# Patient Record
Sex: Male | Born: 1945
Health system: Southern US, Community
[De-identification: ages and names within clinical notes are randomized; demographics above are authoritative.]

## PROBLEM LIST (undated history)

## (undated) DIAGNOSIS — R972 Elevated prostate specific antigen [PSA]: Secondary | ICD-10-CM

## (undated) DIAGNOSIS — M1990 Unspecified osteoarthritis, unspecified site: Secondary | ICD-10-CM

## (undated) DIAGNOSIS — F410 Panic disorder [episodic paroxysmal anxiety] without agoraphobia: Secondary | ICD-10-CM

## (undated) DIAGNOSIS — N403 Nodular prostate with lower urinary tract symptoms: Secondary | ICD-10-CM

## (undated) DIAGNOSIS — Z96652 Presence of left artificial knee joint: Secondary | ICD-10-CM

## (undated) DIAGNOSIS — G609 Hereditary and idiopathic neuropathy, unspecified: Secondary | ICD-10-CM

## (undated) DIAGNOSIS — N4340 Spermatocele of epididymis, unspecified: Secondary | ICD-10-CM

## (undated) DIAGNOSIS — R361 Hematospermia: Secondary | ICD-10-CM

## (undated) DIAGNOSIS — C189 Malignant neoplasm of colon, unspecified: Secondary | ICD-10-CM

## (undated) DIAGNOSIS — F419 Anxiety disorder, unspecified: Secondary | ICD-10-CM

## (undated) DIAGNOSIS — F32A Depression, unspecified: Secondary | ICD-10-CM

## (undated) DIAGNOSIS — I10 Essential (primary) hypertension: Secondary | ICD-10-CM

## (undated) DIAGNOSIS — R3912 Poor urinary stream: Secondary | ICD-10-CM

## (undated) DIAGNOSIS — J45909 Unspecified asthma, uncomplicated: Secondary | ICD-10-CM

## (undated) DIAGNOSIS — R339 Retention of urine, unspecified: Secondary | ICD-10-CM

## (undated) DIAGNOSIS — M1711 Unilateral primary osteoarthritis, right knee: Secondary | ICD-10-CM

## (undated) DIAGNOSIS — F329 Major depressive disorder, single episode, unspecified: Secondary | ICD-10-CM

## (undated) DIAGNOSIS — F411 Generalized anxiety disorder: Secondary | ICD-10-CM

## (undated) DIAGNOSIS — N419 Inflammatory disease of prostate, unspecified: Secondary | ICD-10-CM

## (undated) DIAGNOSIS — C61 Malignant neoplasm of prostate: Secondary | ICD-10-CM

## (undated) DIAGNOSIS — Z9221 Personal history of antineoplastic chemotherapy: Secondary | ICD-10-CM

## (undated) HISTORY — DX: Essential (primary) hypertension: I10

## (undated) HISTORY — DX: Elevated prostate specific antigen (PSA): R97.20

## (undated) HISTORY — DX: Hereditary and idiopathic neuropathy, unspecified: G60.9

## (undated) HISTORY — DX: Poor urinary stream: R39.12

## (undated) HISTORY — DX: Retention of urine, unspecified: R33.9

## (undated) HISTORY — DX: Hematospermia: R36.1

## (undated) HISTORY — DX: Malignant neoplasm of colon, unspecified: C18.9

## (undated) HISTORY — DX: Spermatocele of epididymis, unspecified: N43.40

## (undated) HISTORY — DX: Personal history of antineoplastic chemotherapy: Z92.21

## (undated) HISTORY — DX: Anxiety disorder, unspecified: F41.9

## (undated) HISTORY — PX: PROSTATE BIOPSY: SHX241

## (undated) HISTORY — DX: Nodular prostate with lower urinary tract symptoms: N40.3

## (undated) HISTORY — DX: Major depressive disorder, single episode, unspecified: F32.9

## (undated) HISTORY — PX: TONSILLECTOMY: SUR1361

## (undated) HISTORY — DX: Depression, unspecified: F32.A

## (undated) HISTORY — DX: Malignant neoplasm of prostate: C61

## (undated) HISTORY — DX: Inflammatory disease of prostate, unspecified: N41.9

## (undated) HISTORY — PX: COLONOSCOPY: SHX174

---

## 1998-03-29 ENCOUNTER — Emergency Department (HOSPITAL_COMMUNITY): Admission: EM | Admit: 1998-03-29 | Discharge: 1998-03-29 | Payer: Self-pay | Admitting: Emergency Medicine

## 2007-04-13 DIAGNOSIS — C189 Malignant neoplasm of colon, unspecified: Secondary | ICD-10-CM

## 2007-04-13 HISTORY — DX: Malignant neoplasm of colon, unspecified: C18.9

## 2008-01-25 ENCOUNTER — Ambulatory Visit (HOSPITAL_COMMUNITY): Admission: RE | Admit: 2008-01-25 | Discharge: 2008-01-25 | Payer: Self-pay | Admitting: Gastroenterology

## 2008-01-25 ENCOUNTER — Encounter (INDEPENDENT_AMBULATORY_CARE_PROVIDER_SITE_OTHER): Payer: Self-pay | Admitting: Gastroenterology

## 2008-01-26 ENCOUNTER — Observation Stay (HOSPITAL_COMMUNITY): Admission: AD | Admit: 2008-01-26 | Discharge: 2008-01-26 | Payer: Self-pay | Admitting: Gastroenterology

## 2008-01-31 ENCOUNTER — Encounter: Admission: RE | Admit: 2008-01-31 | Discharge: 2008-01-31 | Payer: Self-pay | Admitting: General Surgery

## 2008-02-11 HISTORY — PX: COLON RESECTION: SHX5231

## 2008-03-01 ENCOUNTER — Inpatient Hospital Stay (HOSPITAL_COMMUNITY): Admission: RE | Admit: 2008-03-01 | Discharge: 2008-03-04 | Payer: Self-pay | Admitting: General Surgery

## 2008-03-01 ENCOUNTER — Encounter (INDEPENDENT_AMBULATORY_CARE_PROVIDER_SITE_OTHER): Payer: Self-pay | Admitting: General Surgery

## 2008-03-08 ENCOUNTER — Inpatient Hospital Stay (HOSPITAL_COMMUNITY): Admission: EM | Admit: 2008-03-08 | Discharge: 2008-03-11 | Payer: Self-pay | Admitting: Emergency Medicine

## 2008-03-19 ENCOUNTER — Ambulatory Visit: Payer: Self-pay | Admitting: Hematology and Oncology

## 2008-03-22 LAB — COMPREHENSIVE METABOLIC PANEL
AST: 17 U/L (ref 0–37)
Albumin: 4.1 g/dL (ref 3.5–5.2)
Alkaline Phosphatase: 71 U/L (ref 39–117)
BUN: 7 mg/dL (ref 6–23)
Potassium: 3.9 mEq/L (ref 3.5–5.3)
Sodium: 141 mEq/L (ref 135–145)
Total Bilirubin: 0.4 mg/dL (ref 0.3–1.2)

## 2008-03-22 LAB — CBC WITH DIFFERENTIAL/PLATELET
Basophils Absolute: 0 10*3/uL (ref 0.0–0.1)
EOS%: 1.7 % (ref 0.0–7.0)
MCH: 24.6 pg — ABNORMAL LOW (ref 28.0–33.4)
MCV: 78.9 fL — ABNORMAL LOW (ref 81.6–98.0)
MONO%: 4.8 % (ref 0.0–13.0)
RBC: 4.54 10*6/uL (ref 4.20–5.71)
RDW: 21.5 % — ABNORMAL HIGH (ref 11.2–14.6)

## 2008-04-01 ENCOUNTER — Ambulatory Visit (HOSPITAL_BASED_OUTPATIENT_CLINIC_OR_DEPARTMENT_OTHER): Admission: RE | Admit: 2008-04-01 | Discharge: 2008-04-01 | Payer: Self-pay | Admitting: General Surgery

## 2008-04-02 ENCOUNTER — Ambulatory Visit (HOSPITAL_COMMUNITY): Admission: RE | Admit: 2008-04-02 | Discharge: 2008-04-02 | Payer: Self-pay | Admitting: Hematology and Oncology

## 2008-04-12 DIAGNOSIS — Z9221 Personal history of antineoplastic chemotherapy: Secondary | ICD-10-CM

## 2008-04-12 DIAGNOSIS — C61 Malignant neoplasm of prostate: Secondary | ICD-10-CM

## 2008-04-12 HISTORY — DX: Malignant neoplasm of prostate: C61

## 2008-04-12 HISTORY — DX: Personal history of antineoplastic chemotherapy: Z92.21

## 2008-04-16 LAB — COMPREHENSIVE METABOLIC PANEL
AST: 21 U/L (ref 0–37)
BUN: 10 mg/dL (ref 6–23)
Calcium: 9 mg/dL (ref 8.4–10.5)
Chloride: 107 mEq/L (ref 96–112)
Creatinine, Ser: 1.09 mg/dL (ref 0.40–1.50)
Total Bilirubin: 0.7 mg/dL (ref 0.3–1.2)

## 2008-04-16 LAB — CBC WITH DIFFERENTIAL/PLATELET
Basophils Absolute: 0 10*3/uL (ref 0.0–0.1)
EOS%: 2.2 % (ref 0.0–7.0)
HCT: 37.5 % — ABNORMAL LOW (ref 38.7–49.9)
HGB: 11.9 g/dL — ABNORMAL LOW (ref 13.0–17.1)
LYMPH%: 21.5 % (ref 14.0–48.0)
MCH: 24.6 pg — ABNORMAL LOW (ref 28.0–33.4)
MCV: 77.4 fL — ABNORMAL LOW (ref 81.6–98.0)
MONO%: 5.4 % (ref 0.0–13.0)
NEUT%: 70.5 % (ref 40.0–75.0)
Platelets: 335 10*3/uL (ref 145–400)
lymph#: 1.7 10*3/uL (ref 0.9–3.3)

## 2008-04-26 LAB — CBC WITH DIFFERENTIAL/PLATELET
EOS%: 2.9 % (ref 0.0–7.0)
MCH: 25 pg — ABNORMAL LOW (ref 28.0–33.4)
MCV: 77.7 fL — ABNORMAL LOW (ref 81.6–98.0)
MONO%: 6.5 % (ref 0.0–13.0)
NEUT#: 8.7 10*3/uL — ABNORMAL HIGH (ref 1.5–6.5)
RBC: 4.55 10*6/uL (ref 4.20–5.71)
RDW: 16.9 % — ABNORMAL HIGH (ref 11.2–14.6)

## 2008-04-26 LAB — COMPREHENSIVE METABOLIC PANEL
AST: 13 U/L (ref 0–37)
Albumin: 3.9 g/dL (ref 3.5–5.2)
Alkaline Phosphatase: 116 U/L (ref 39–117)
Potassium: 3.2 mEq/L — ABNORMAL LOW (ref 3.5–5.3)
Sodium: 140 mEq/L (ref 135–145)
Total Protein: 6.6 g/dL (ref 6.0–8.3)

## 2008-04-29 ENCOUNTER — Ambulatory Visit: Payer: Self-pay | Admitting: Hematology and Oncology

## 2008-05-14 LAB — COMPREHENSIVE METABOLIC PANEL
ALT: 15 U/L (ref 0–53)
AST: 15 U/L (ref 0–37)
Albumin: 4.1 g/dL (ref 3.5–5.2)
BUN: 8 mg/dL (ref 6–23)
CO2: 26 mEq/L (ref 19–32)
Calcium: 9.4 mg/dL (ref 8.4–10.5)
Chloride: 106 mEq/L (ref 96–112)
Creatinine, Ser: 1.05 mg/dL (ref 0.40–1.50)
Potassium: 3.7 mEq/L (ref 3.5–5.3)

## 2008-05-14 LAB — CBC WITH DIFFERENTIAL/PLATELET
Basophils Absolute: 0.1 10*3/uL (ref 0.0–0.1)
EOS%: 1.8 % (ref 0.0–7.0)
HCT: 36.1 % — ABNORMAL LOW (ref 38.7–49.9)
HGB: 11.7 g/dL — ABNORMAL LOW (ref 13.0–17.1)
MCH: 25.1 pg — ABNORMAL LOW (ref 28.0–33.4)
MONO#: 1.3 10*3/uL — ABNORMAL HIGH (ref 0.1–0.9)
NEUT#: 10.6 10*3/uL — ABNORMAL HIGH (ref 1.5–6.5)
NEUT%: 71.7 % (ref 40.0–75.0)
RDW: 17.9 % — ABNORMAL HIGH (ref 11.2–14.6)
WBC: 14.7 10*3/uL — ABNORMAL HIGH (ref 4.0–10.0)
lymph#: 2.5 10*3/uL (ref 0.9–3.3)

## 2008-05-28 LAB — CBC WITH DIFFERENTIAL/PLATELET
BASO%: 0.5 % (ref 0.0–2.0)
Basophils Absolute: 0.1 10*3/uL (ref 0.0–0.1)
EOS%: 2.5 % (ref 0.0–7.0)
HGB: 12.4 g/dL — ABNORMAL LOW (ref 13.0–17.1)
MCH: 24.9 pg — ABNORMAL LOW (ref 28.0–33.4)
MCHC: 31.8 g/dL — ABNORMAL LOW (ref 32.0–35.9)
MCV: 78.1 fL — ABNORMAL LOW (ref 81.6–98.0)
MONO%: 10.5 % (ref 0.0–13.0)
RBC: 5 10*6/uL (ref 4.20–5.71)
RDW: 18.6 % — ABNORMAL HIGH (ref 11.2–14.6)
lymph#: 1.7 10*3/uL (ref 0.9–3.3)

## 2008-05-28 LAB — COMPREHENSIVE METABOLIC PANEL
ALT: 21 U/L (ref 0–53)
AST: 24 U/L (ref 0–37)
Albumin: 4.1 g/dL (ref 3.5–5.2)
Alkaline Phosphatase: 154 U/L — ABNORMAL HIGH (ref 39–117)
BUN: 12 mg/dL (ref 6–23)
Chloride: 100 mEq/L (ref 96–112)
Potassium: 3.3 mEq/L — ABNORMAL LOW (ref 3.5–5.3)

## 2008-06-07 LAB — CBC WITH DIFFERENTIAL/PLATELET
BASO%: 0.2 % (ref 0.0–2.0)
Basophils Absolute: 0 10*3/uL (ref 0.0–0.1)
EOS%: 1.7 % (ref 0.0–7.0)
HCT: 36.1 % — ABNORMAL LOW (ref 38.4–49.9)
HGB: 11.5 g/dL — ABNORMAL LOW (ref 13.0–17.1)
MCH: 24.6 pg — ABNORMAL LOW (ref 27.2–33.4)
MCHC: 31.7 g/dL — ABNORMAL LOW (ref 32.0–36.0)
MCV: 77.7 fL — ABNORMAL LOW (ref 79.3–98.0)
MONO%: 5 % (ref 0.0–14.0)
NEUT%: 81.6 % — ABNORMAL HIGH (ref 39.0–75.0)
RDW: 19.7 % — ABNORMAL HIGH (ref 11.0–14.6)

## 2008-06-07 LAB — COMPREHENSIVE METABOLIC PANEL
ALT: 38 U/L (ref 0–53)
AST: 29 U/L (ref 0–37)
Alkaline Phosphatase: 169 U/L — ABNORMAL HIGH (ref 39–117)
BUN: 14 mg/dL (ref 6–23)
Creatinine, Ser: 1.21 mg/dL (ref 0.40–1.50)
Total Bilirubin: 0.5 mg/dL (ref 0.3–1.2)

## 2008-06-12 ENCOUNTER — Ambulatory Visit: Payer: Self-pay | Admitting: Hematology and Oncology

## 2008-06-25 LAB — CBC WITH DIFFERENTIAL/PLATELET
Basophils Absolute: 0.1 10*3/uL (ref 0.0–0.1)
Eosinophils Absolute: 0.2 10*3/uL (ref 0.0–0.5)
HCT: 39.3 % (ref 38.4–49.9)
HGB: 12 g/dL — ABNORMAL LOW (ref 13.0–17.1)
LYMPH%: 18.5 % (ref 14.0–49.0)
MCV: 79.4 fL (ref 79.3–98.0)
MONO#: 1.4 10*3/uL — ABNORMAL HIGH (ref 0.1–0.9)
MONO%: 9.7 % (ref 0.0–14.0)
NEUT#: 10.1 10*3/uL — ABNORMAL HIGH (ref 1.5–6.5)
NEUT%: 70 % (ref 39.0–75.0)
Platelets: 221 10*3/uL (ref 140–400)
WBC: 14.4 10*3/uL — ABNORMAL HIGH (ref 4.0–10.3)

## 2008-06-25 LAB — COMPREHENSIVE METABOLIC PANEL
AST: 28 U/L (ref 0–37)
BUN: 7 mg/dL (ref 6–23)
Calcium: 9.5 mg/dL (ref 8.4–10.5)
Chloride: 105 mEq/L (ref 96–112)
Creatinine, Ser: 1.13 mg/dL (ref 0.40–1.50)
Glucose, Bld: 110 mg/dL — ABNORMAL HIGH (ref 70–99)

## 2008-07-09 LAB — COMPREHENSIVE METABOLIC PANEL
Albumin: 4.2 g/dL (ref 3.5–5.2)
BUN: 8 mg/dL (ref 6–23)
CO2: 22 mEq/L (ref 19–32)
Calcium: 9.4 mg/dL (ref 8.4–10.5)
Chloride: 107 mEq/L (ref 96–112)
Creatinine, Ser: 1.1 mg/dL (ref 0.40–1.50)
Glucose, Bld: 87 mg/dL (ref 70–99)
Potassium: 3.8 mEq/L (ref 3.5–5.3)

## 2008-07-09 LAB — CBC WITH DIFFERENTIAL/PLATELET
Basophils Absolute: 0.1 10*3/uL (ref 0.0–0.1)
EOS%: 1.6 % (ref 0.0–7.0)
Eosinophils Absolute: 0.3 10*3/uL (ref 0.0–0.5)
HCT: 37.2 % — ABNORMAL LOW (ref 38.4–49.9)
HGB: 11.8 g/dL — ABNORMAL LOW (ref 13.0–17.1)
MCH: 25.3 pg — ABNORMAL LOW (ref 27.2–33.4)
MONO#: 1.4 10*3/uL — ABNORMAL HIGH (ref 0.1–0.9)
NEUT#: 13.4 10*3/uL — ABNORMAL HIGH (ref 1.5–6.5)
NEUT%: 74.7 % (ref 39.0–75.0)
RDW: 24.5 % — ABNORMAL HIGH (ref 11.0–14.6)
lymph#: 2.8 10*3/uL (ref 0.9–3.3)

## 2008-07-19 LAB — CBC WITH DIFFERENTIAL/PLATELET
BASO%: 0 % (ref 0.0–2.0)
LYMPH%: 7.8 % — ABNORMAL LOW (ref 14.0–49.0)
MCH: 25.6 pg — ABNORMAL LOW (ref 27.2–33.4)
MCHC: 32 g/dL (ref 32.0–36.0)
MCV: 79.9 fL (ref 79.3–98.0)
MONO%: 6.1 % (ref 0.0–14.0)
Platelets: 273 10*3/uL (ref 140–400)
RBC: 4.31 10*6/uL (ref 4.20–5.82)

## 2008-07-19 LAB — COMPREHENSIVE METABOLIC PANEL
ALT: 13 U/L (ref 0–53)
Alkaline Phosphatase: 182 U/L — ABNORMAL HIGH (ref 39–117)
Sodium: 143 mEq/L (ref 135–145)
Total Bilirubin: 0.4 mg/dL (ref 0.3–1.2)
Total Protein: 6.5 g/dL (ref 6.0–8.3)

## 2008-08-02 ENCOUNTER — Ambulatory Visit: Payer: Self-pay | Admitting: Hematology and Oncology

## 2008-08-06 LAB — CBC WITH DIFFERENTIAL/PLATELET
Eosinophils Absolute: 0.1 10*3/uL (ref 0.0–0.5)
HCT: 35.7 % — ABNORMAL LOW (ref 38.4–49.9)
LYMPH%: 36.6 % (ref 14.0–49.0)
MCV: 81.8 fL (ref 79.3–98.0)
MONO%: 16.2 % — ABNORMAL HIGH (ref 0.0–14.0)
NEUT#: 1.6 10*3/uL (ref 1.5–6.5)
NEUT%: 42.6 % (ref 39.0–75.0)
Platelets: 206 10*3/uL (ref 140–400)
RBC: 4.36 10*6/uL (ref 4.20–5.82)

## 2008-08-06 LAB — COMPREHENSIVE METABOLIC PANEL
Alkaline Phosphatase: 91 U/L (ref 39–117)
BUN: 8 mg/dL (ref 6–23)
Creatinine, Ser: 0.97 mg/dL (ref 0.40–1.50)
Glucose, Bld: 110 mg/dL — ABNORMAL HIGH (ref 70–99)
Sodium: 140 mEq/L (ref 135–145)
Total Bilirubin: 0.4 mg/dL (ref 0.3–1.2)
Total Protein: 6.8 g/dL (ref 6.0–8.3)

## 2008-08-20 LAB — COMPREHENSIVE METABOLIC PANEL
ALT: 15 U/L (ref 0–53)
Alkaline Phosphatase: 134 U/L — ABNORMAL HIGH (ref 39–117)
Creatinine, Ser: 0.96 mg/dL (ref 0.40–1.50)
Sodium: 141 mEq/L (ref 135–145)
Total Bilirubin: 0.4 mg/dL (ref 0.3–1.2)
Total Protein: 6.8 g/dL (ref 6.0–8.3)

## 2008-08-20 LAB — CBC WITH DIFFERENTIAL/PLATELET
BASO%: 0.3 % (ref 0.0–2.0)
EOS%: 1.2 % (ref 0.0–7.0)
LYMPH%: 14.5 % (ref 14.0–49.0)
MCH: 26.9 pg — ABNORMAL LOW (ref 27.2–33.4)
MCHC: 32.1 g/dL (ref 32.0–36.0)
MCV: 83.8 fL (ref 79.3–98.0)
MONO%: 5.3 % (ref 0.0–14.0)
Platelets: 149 10*3/uL (ref 140–400)
RBC: 4.43 10*6/uL (ref 4.20–5.82)
WBC: 15.3 10*3/uL — ABNORMAL HIGH (ref 4.0–10.3)

## 2008-09-03 LAB — CBC WITH DIFFERENTIAL/PLATELET
BASO%: 0.8 % (ref 0.0–2.0)
Eosinophils Absolute: 0.1 10*3/uL (ref 0.0–0.5)
HCT: 35.2 % — ABNORMAL LOW (ref 38.4–49.9)
LYMPH%: 47.2 % (ref 14.0–49.0)
MCHC: 32.5 g/dL (ref 32.0–36.0)
MCV: 83.3 fL (ref 79.3–98.0)
MONO%: 15.4 % — ABNORMAL HIGH (ref 0.0–14.0)
NEUT%: 33.2 % — ABNORMAL LOW (ref 39.0–75.0)
Platelets: 192 10*3/uL (ref 140–400)
RBC: 4.23 10*6/uL (ref 4.20–5.82)

## 2008-09-03 LAB — COMPREHENSIVE METABOLIC PANEL
ALT: 17 U/L (ref 0–53)
Alkaline Phosphatase: 82 U/L (ref 39–117)
CO2: 27 mEq/L (ref 19–32)
Sodium: 142 mEq/L (ref 135–145)
Total Bilirubin: 0.4 mg/dL (ref 0.3–1.2)
Total Protein: 6.8 g/dL (ref 6.0–8.3)

## 2008-09-13 ENCOUNTER — Ambulatory Visit: Payer: Self-pay | Admitting: Hematology and Oncology

## 2008-09-17 LAB — COMPREHENSIVE METABOLIC PANEL
Albumin: 4.1 g/dL (ref 3.5–5.2)
CO2: 25 mEq/L (ref 19–32)
Chloride: 104 mEq/L (ref 96–112)
Glucose, Bld: 85 mg/dL (ref 70–99)
Potassium: 3.4 mEq/L — ABNORMAL LOW (ref 3.5–5.3)
Sodium: 141 mEq/L (ref 135–145)
Total Protein: 7.4 g/dL (ref 6.0–8.3)

## 2008-09-17 LAB — CBC WITH DIFFERENTIAL/PLATELET
Eosinophils Absolute: 0.2 10*3/uL (ref 0.0–0.5)
MONO#: 1 10*3/uL — ABNORMAL HIGH (ref 0.1–0.9)
NEUT#: 15.6 10*3/uL — ABNORMAL HIGH (ref 1.5–6.5)
RBC: 4.72 10*6/uL (ref 4.20–5.82)
RDW: 20.1 % — ABNORMAL HIGH (ref 11.0–14.6)
WBC: 20 10*3/uL — ABNORMAL HIGH (ref 4.0–10.3)

## 2008-10-02 ENCOUNTER — Ambulatory Visit (HOSPITAL_COMMUNITY): Admission: RE | Admit: 2008-10-02 | Discharge: 2008-10-02 | Payer: Self-pay | Admitting: Hematology and Oncology

## 2008-10-02 LAB — CBC WITH DIFFERENTIAL/PLATELET
Eosinophils Absolute: 0.2 10*3/uL (ref 0.0–0.5)
MCV: 82.5 fL (ref 79.3–98.0)
MONO%: 10.7 % (ref 0.0–14.0)
NEUT#: 3.1 10*3/uL (ref 1.5–6.5)
RBC: 4.53 10*6/uL (ref 4.20–5.82)
RDW: 20 % — ABNORMAL HIGH (ref 11.0–14.6)
WBC: 5.7 10*3/uL (ref 4.0–10.3)

## 2008-10-02 LAB — COMPREHENSIVE METABOLIC PANEL
ALT: 19 U/L (ref 0–53)
AST: 35 U/L (ref 0–37)
Albumin: 3.6 g/dL (ref 3.5–5.2)
Alkaline Phosphatase: 76 U/L (ref 39–117)
Glucose, Bld: 100 mg/dL — ABNORMAL HIGH (ref 70–99)
Potassium: 2.8 mEq/L — ABNORMAL LOW (ref 3.5–5.3)
Sodium: 134 mEq/L — ABNORMAL LOW (ref 135–145)
Total Protein: 6.9 g/dL (ref 6.0–8.3)

## 2008-10-30 ENCOUNTER — Ambulatory Visit: Payer: Self-pay | Admitting: Hematology and Oncology

## 2008-12-11 ENCOUNTER — Ambulatory Visit: Payer: Self-pay | Admitting: Hematology and Oncology

## 2009-01-15 ENCOUNTER — Ambulatory Visit: Payer: Self-pay | Admitting: Oncology

## 2009-01-17 LAB — COMPREHENSIVE METABOLIC PANEL
ALT: 20 U/L (ref 0–53)
AST: 30 U/L (ref 0–37)
Albumin: 3.9 g/dL (ref 3.5–5.2)
BUN: 13 mg/dL (ref 6–23)
CO2: 27 mEq/L (ref 19–32)
Calcium: 9.4 mg/dL (ref 8.4–10.5)
Chloride: 101 mEq/L (ref 96–112)
Potassium: 2.8 mEq/L — ABNORMAL LOW (ref 3.5–5.3)

## 2009-01-17 LAB — CBC WITH DIFFERENTIAL/PLATELET
Basophils Absolute: 0 10*3/uL (ref 0.0–0.1)
EOS%: 3.2 % (ref 0.0–7.0)
HCT: 37.1 % — ABNORMAL LOW (ref 38.4–49.9)
HGB: 12.3 g/dL — ABNORMAL LOW (ref 13.0–17.1)
MCH: 27.8 pg (ref 27.2–33.4)
MONO#: 0.4 10*3/uL (ref 0.1–0.9)
NEUT#: 4.9 10*3/uL (ref 1.5–6.5)
RDW: 13.7 % (ref 11.0–14.6)
WBC: 8 10*3/uL (ref 4.0–10.3)
lymph#: 2.5 10*3/uL (ref 0.9–3.3)

## 2009-01-17 LAB — CEA: CEA: 1.8 ng/mL (ref 0.0–5.0)

## 2009-01-20 ENCOUNTER — Encounter: Admission: RE | Admit: 2009-01-20 | Discharge: 2009-01-20 | Payer: Self-pay | Admitting: Hematology and Oncology

## 2009-01-22 LAB — BASIC METABOLIC PANEL
BUN: 10 mg/dL (ref 6–23)
Creatinine, Ser: 1.46 mg/dL (ref 0.40–1.50)
Potassium: 2.7 mEq/L — CL (ref 3.5–5.3)

## 2009-02-16 ENCOUNTER — Emergency Department (HOSPITAL_COMMUNITY): Admission: EM | Admit: 2009-02-16 | Discharge: 2009-02-17 | Payer: Self-pay | Admitting: Emergency Medicine

## 2009-02-16 ENCOUNTER — Emergency Department (HOSPITAL_COMMUNITY): Admission: EM | Admit: 2009-02-16 | Discharge: 2009-02-16 | Payer: Self-pay | Admitting: Emergency Medicine

## 2009-02-17 ENCOUNTER — Ambulatory Visit: Payer: Self-pay | Admitting: Psychiatry

## 2009-02-17 ENCOUNTER — Inpatient Hospital Stay (HOSPITAL_COMMUNITY): Admission: RE | Admit: 2009-02-17 | Discharge: 2009-02-19 | Payer: Self-pay | Admitting: Psychiatry

## 2009-03-17 ENCOUNTER — Ambulatory Visit: Payer: Self-pay | Admitting: Oncology

## 2009-03-26 ENCOUNTER — Ambulatory Visit (HOSPITAL_COMMUNITY): Admission: RE | Admit: 2009-03-26 | Discharge: 2009-03-26 | Payer: Self-pay | Admitting: Psychiatry

## 2009-05-12 ENCOUNTER — Ambulatory Visit: Payer: Self-pay | Admitting: Oncology

## 2009-05-14 LAB — CEA: CEA: 1.9 ng/mL (ref 0.0–5.0)

## 2009-05-14 LAB — CBC WITH DIFFERENTIAL/PLATELET
Basophils Absolute: 0 10*3/uL (ref 0.0–0.1)
Eosinophils Absolute: 0.2 10*3/uL (ref 0.0–0.5)
HCT: 41.1 % (ref 38.4–49.9)
HGB: 13.4 g/dL (ref 13.0–17.1)
LYMPH%: 28.9 % (ref 14.0–49.0)
MCV: 88.4 fL (ref 79.3–98.0)
MONO#: 0.4 10*3/uL (ref 0.1–0.9)
MONO%: 6.3 % (ref 0.0–14.0)
NEUT#: 4.2 10*3/uL (ref 1.5–6.5)
NEUT%: 61.7 % (ref 39.0–75.0)
Platelets: 292 10*3/uL (ref 140–400)
RBC: 4.65 10*6/uL (ref 4.20–5.82)
WBC: 6.9 10*3/uL (ref 4.0–10.3)

## 2009-05-14 LAB — COMPREHENSIVE METABOLIC PANEL
AST: 22 U/L (ref 0–37)
Albumin: 4.6 g/dL (ref 3.5–5.2)
Chloride: 107 mEq/L (ref 96–112)
Creatinine, Ser: 1.24 mg/dL (ref 0.40–1.50)
Potassium: 4.3 mEq/L (ref 3.5–5.3)
Sodium: 140 mEq/L (ref 135–145)
Total Protein: 7.9 g/dL (ref 6.0–8.3)

## 2009-05-16 ENCOUNTER — Encounter: Admission: RE | Admit: 2009-05-16 | Discharge: 2009-05-16 | Payer: Self-pay | Admitting: Oncology

## 2009-07-11 ENCOUNTER — Ambulatory Visit: Payer: Self-pay | Admitting: Oncology

## 2009-09-09 ENCOUNTER — Ambulatory Visit: Payer: Self-pay | Admitting: Oncology

## 2009-09-10 LAB — COMPREHENSIVE METABOLIC PANEL
AST: 31 U/L (ref 0–37)
Alkaline Phosphatase: 76 U/L (ref 39–117)
CO2: 21 mEq/L (ref 19–32)
Glucose, Bld: 83 mg/dL (ref 70–99)
Sodium: 139 mEq/L (ref 135–145)
Total Bilirubin: 0.4 mg/dL (ref 0.3–1.2)

## 2009-09-10 LAB — CBC WITH DIFFERENTIAL/PLATELET
BASO%: 0.7 % (ref 0.0–2.0)
HGB: 12.2 g/dL — ABNORMAL LOW (ref 13.0–17.1)
MCHC: 33.9 g/dL (ref 32.0–36.0)
MCV: 87.8 fL (ref 79.3–98.0)
Platelets: 298 10*3/uL (ref 140–400)
RBC: 4.09 10*6/uL — ABNORMAL LOW (ref 4.20–5.82)
RDW: 15.7 % — ABNORMAL HIGH (ref 11.0–14.6)
WBC: 6.6 10*3/uL (ref 4.0–10.3)
lymph#: 1.7 10*3/uL (ref 0.9–3.3)

## 2009-09-15 ENCOUNTER — Encounter: Admission: RE | Admit: 2009-09-15 | Discharge: 2009-09-15 | Payer: Self-pay | Admitting: Hematology and Oncology

## 2009-09-22 ENCOUNTER — Ambulatory Visit (HOSPITAL_COMMUNITY): Admission: RE | Admit: 2009-09-22 | Discharge: 2009-09-22 | Payer: Self-pay | Admitting: General Surgery

## 2010-01-09 ENCOUNTER — Ambulatory Visit: Payer: Self-pay | Admitting: Oncology

## 2010-05-03 ENCOUNTER — Encounter: Payer: Self-pay | Admitting: Hematology and Oncology

## 2010-05-03 ENCOUNTER — Encounter (HOSPITAL_COMMUNITY): Payer: Self-pay | Admitting: Oncology

## 2010-06-29 LAB — BASIC METABOLIC PANEL
BUN: 6 mg/dL (ref 6–23)
Chloride: 107 mEq/L (ref 96–112)
GFR calc Af Amer: >60 mL/min (ref >60)
GFR calc non Af Amer: >60 mL/min (ref >60)
Potassium: 4.8 mEq/L (ref 3.5–5.1)

## 2010-06-29 LAB — CBC
HCT: 39.7 % (ref 39.0–52.0)
MCV: 89 fL (ref 78.0–100.0)
Platelets: 320 10*3/uL (ref 150–400)
RBC: 4.46 MIL/uL (ref 4.22–5.81)
WBC: 6.8 10*3/uL (ref 4.0–10.5)

## 2010-07-15 LAB — RAPID URINE DRUG SCREEN, HOSP PERFORMED
Amphetamines: NOT DETECTED
Barbiturates: NOT DETECTED
Benzodiazepines: POSITIVE — AB
Cocaine: NOT DETECTED

## 2010-07-15 LAB — URINALYSIS, ROUTINE W REFLEX MICROSCOPIC
Bilirubin Urine: NEGATIVE
Glucose, UA: NEGATIVE mg/dL
Nitrite: NEGATIVE
Specific Gravity, Urine: 1.021 (ref 1.005–1.030)
pH: 6 (ref 5.0–8.0)

## 2010-07-15 LAB — COMPREHENSIVE METABOLIC PANEL
ALT: 18 U/L (ref 0–53)
ALT: 21 U/L (ref 0–53)
AST: 33 U/L (ref 0–37)
Albumin: 3.6 g/dL (ref 3.5–5.2)
Albumin: 3.9 g/dL (ref 3.5–5.2)
Alkaline Phosphatase: 74 U/L (ref 39–117)
Alkaline Phosphatase: 78 U/L (ref 39–117)
BUN: 8 mg/dL (ref 6–23)
Calcium: 9 mg/dL (ref 8.4–10.5)
Chloride: 107 mEq/L (ref 96–112)
GFR calc Af Amer: 56 mL/min — ABNORMAL LOW (ref >60)
Glucose, Bld: 113 mg/dL — ABNORMAL HIGH (ref 70–99)
Glucose, Bld: 121 mg/dL — ABNORMAL HIGH (ref 70–99)
Potassium: 2.5 mEq/L — CL (ref 3.5–5.1)
Potassium: 3.1 mEq/L — ABNORMAL LOW (ref 3.5–5.1)
Sodium: 136 mEq/L (ref 135–145)
Sodium: 142 mEq/L (ref 135–145)
Total Bilirubin: 0.6 mg/dL (ref 0.3–1.2)
Total Protein: 6.9 g/dL (ref 6.0–8.3)
Total Protein: 7.5 g/dL (ref 6.0–8.3)

## 2010-07-15 LAB — BASIC METABOLIC PANEL
CO2: 25 mEq/L (ref 19–32)
CO2: 26 mEq/L (ref 19–32)
Calcium: 9 mg/dL (ref 8.4–10.5)
Calcium: 9.5 mg/dL (ref 8.4–10.5)
Creatinine, Ser: 1.11 mg/dL (ref 0.4–1.5)
GFR calc Af Amer: >60 mL/min (ref >60)
GFR calc non Af Amer: 53 mL/min — ABNORMAL LOW (ref >60)
GFR calc non Af Amer: >60 mL/min (ref >60)
Glucose, Bld: 121 mg/dL — ABNORMAL HIGH (ref 70–99)
Glucose, Bld: 120 mg/dL — ABNORMAL HIGH (ref 70–99)
Potassium: 3.2 mEq/L — ABNORMAL LOW (ref 3.5–5.1)
Sodium: 139 mEq/L (ref 135–145)

## 2010-07-15 LAB — DIFFERENTIAL
Basophils Absolute: 0 10*3/uL (ref 0.0–0.1)
Basophils Relative: 0 % (ref 0–1)
Basophils Relative: 0 % (ref 0–1)
Eosinophils Absolute: 0 10*3/uL (ref 0.0–0.7)
Eosinophils Relative: 0 % (ref 0–5)
Lymphocytes Relative: 24 % (ref 12–46)
Lymphs Abs: 1 10*3/uL (ref 0.7–4.0)
Monocytes Absolute: 0.5 10*3/uL (ref 0.1–1.0)
Monocytes Absolute: 0.5 10*3/uL (ref 0.1–1.0)
Monocytes Relative: 5 % (ref 3–12)
Neutro Abs: 5.7 10*3/uL (ref 1.7–7.7)
Neutrophils Relative %: 67 % (ref 43–77)

## 2010-07-15 LAB — CBC
HCT: 33.1 % — ABNORMAL LOW (ref 39.0–52.0)
MCHC: 32.8 g/dL (ref 30.0–36.0)
MCV: 86.3 fL (ref 78.0–100.0)
Platelets: 247 10*3/uL (ref 150–400)
Platelets: 272 10*3/uL (ref 150–400)
RDW: 14.2 % (ref 11.5–15.5)
RDW: 15.3 % (ref 11.5–15.5)

## 2010-07-15 LAB — ETHANOL: Alcohol, Ethyl (B): <5 mg/dL (ref 0–10)

## 2010-08-25 NOTE — Op Note (Signed)
[name], George               ACCOUNT NO.:  [id number]   MEDICAL RECORD NO.:  [id number]          PATIENT TYPE:  AMB   LOCATION:  DSC                          FACILITY:  [facility]   PHYSICIAN:  Lennie Muckle, MD      DATE OF BIRTH:  1945-07-04   DATE OF PROCEDURE:  04/01/2008  DATE OF DISCHARGE:                               OPERATIVE REPORT   PREOPERATIVE DIAGNOSIS:  Metastatic colon cancer.   POSTOPERATIVE DIAGNOSIS:  Metastatic colon cancer.   PROCEDURE:  Port-A-Cath placement.   SURGEON:  Amber L. Freida Busman, MD   ASSISTANT:  None.   ANESTHESIA:  General endotracheal anesthesia.   FINDINGS:  Good position of the tip of the catheter, SVC, atrium.   COMPLICATIONS:  No immediate complications.   DRAINS:  No drains were placed.   INDICATIONS FOR PROCEDURE:  Mr. Valadez is a 64 year old male who had had a  right colon lesion which was positive to several lymph nodes in the  mesentery.  He had seen Oncology and is scheduled for chemotherapy.  He  needed a port placement.  I discussed with the patient risks of the  surgery in the preoperative holding area.   PROCEDURE IN DETAIL:  Mr. Want was identified in preoperative holding  area.  He received 1 gram of Kefzol and was taken to the operating room.  Once in the operating room, placed in supine position.  He was placed  under general endotracheal anesthesia.  A roll was placed between the  shoulder blades and his anterior chest was prepped and draped in the  usual sterile fashion.  A time-out procedure indicating the patient and  procedure were performed.  The patient was placed in Trendelenburg  position.  Using the sacral needle, I gained entry into the subclavian  vein on the left.  Wire was placed into the subclavian vein.  Using the  Seldinger technique, I placed the PowerPort into the SVC.  Using  fluoroscopic guidance, I was able to place the tip of the junction in  SVC and atrium.  I then tunneled the catheter through the  subcutaneous  tissues after dissecting the bed for the port.  I did a final x-ray of  the position of the tip of the catheter after creating the pocket for  the port.  This did appeared to be at the junction of the SVC and  atrium.  I then secured the port to the pectoralis fascia using 2-0  Prolene suture on 4 positions.  I closed in layers the subcutaneous  tissues over the port.  The port did aspirate and flushed easily.  A  final flush of 5000 units of heparin were placed into the port in the  catheter.  Skin was closed with 4-0 Monocryl and Dermabond placed for  final dressing.  The patient was extubated and transported to  Postanesthesia Care Unit in stable condition.  He will receive a chest x-  ray in the postoperative holding area and will be discharged to home.  He will follow up for his chemotherapy as scheduled in January.  Lennie Muckle, MD  Electronically Signed     ALA/MEDQ  D:  04/01/2008  T:  04/01/2008  Job:  [id number]

## 2010-08-25 NOTE — H&P (Signed)
NAMEBRAXSTON, QUINTER               ACCOUNT NO.:  [id number]   MEDICAL RECORD NO.:  [id number]          PATIENT TYPE:  INP   LOCATION:  1527                         FACILITY:  [facility]   PHYSICIAN:  Lennie Muckle, MD      DATE OF BIRTH:  26-Mar-1946   DATE OF ADMISSION:  03/07/2008  DATE OF DISCHARGE:  03/11/2008                              HISTORY & PHYSICAL   REASON FOR HOSPITALIZATION:  Nausea and vomiting.   HISTORY OF PRESENT ILLNESS:  Mr. Swoboda is a 65 year old male who had a  colon resection on March 01, 2008.  He was doing well until Monday.  He had nausea and vomiting.  He seemed to have emesis over the next  couple of days.  He had no pain.  He came to the emergency department  due to being unable to keep liquids down.  A CT scan was done without  significant findings.  He did have somewhat of an ileus.  There were no  fevers at home.   PAST MEDICAL HISTORY:  Hypertension.   SURGICAL HISTORY:  As previous surgery.   MEDICATIONS:  As per the chart.   ALLERGIES:  No drug allergies.   PHYSICAL EXAMINATION:  He was lying in bed.  Temperature 98.8, pulse 74, blood pressure 137/65.  Focused examination of his abdomen revealed a distended abdomen,  nontender, no erythema.  Incisions are healing well.   White count was up slightly above 17.9, hemoglobin and hematocrit 18 and  27.  BUN and creatinine 31 and 2.   ASSESSMENT/PLAN:  Postoperative ileus with dehydration.  He has  responded well to IV fluids.  His creatinine has gone down since his  admission.  He has a slight elevation in white count, but not fevers.  CT is negative.   1. No antibiotics will be given today.  2. Continue to monitor for any changes.  If he continues to do well,      we will likely be able to discharge him home in a couple of days.  3. We will continue all his home medicines for his hypertension.      Lennie Muckle, MD  Electronically Signed     ALA/MEDQ  D:  04/08/2008  T:  04/08/2008   Job:  [id number]

## 2010-08-25 NOTE — Op Note (Signed)
George [name], George [name]               ACCOUNT NO.:  [id number]   MEDICAL RECORD NO.:  [id number]          PATIENT TYPE:  INP   LOCATION:  0008                         FACILITY:  [facility]   PHYSICIAN:  Lennie Muckle, MD      DATE OF BIRTH:  [birth year]   DATE OF PROCEDURE:  03/01/2008  DATE OF DISCHARGE:                               OPERATIVE REPORT   PREOPERATIVE DIAGNOSIS:  Adenocarcinoma of the right colon.   POSTOPERATIVE DIAGNOSIS:  Adenocarcinoma of the right colon.   OPERATION/PROCEDURE:  Hand-assisted laparoscopic right hemicolectomy.   SURGEON:  Lennie Muckle, M.D.   ASSISTANT:  Alfonse Ras, M.D.   ANESTHESIA:  General endotracheal anesthesia.   FINDINGS:  Mass near the cecum.  Liver free of disease.   SPECIMEN:  Right colon & distal ileum to pathology.   ESTIMATED BLOOD LOSS:  50 mL.   COMPLICATIONS:  None.   DRAINS:  No drains were placed.   INDICATIONS FOR PROCEDURE:  Mr. Garden is a 65 year old male who had  anemia.  He had had a colonoscopy which revealed a mass in the right  colon.  It was almost circumferential in nature.  Biopsy revealed  adenocarcinoma.  In discussion with the patient,  I had talked about  performing a hand-assisted laparoscopic colon resection.  Risks of the  surgery including but  not limited to bleeding, infection, hematoma,  anastomotic leak requiring more surgery were discussed with the patient.  Informed consent was obtained.  He was offered reading material on the  subject.   DETAILS OF THE PROCEDURE:  Mr. Agrusa was identified in the preop holding  area.  He received subcutaneous heparin for DVT prophylaxis and was also  given 2 g of cefoxitin.  He was then taken to the operating room, placed  in supine position.  After administration of general endotracheal  anesthesia, Foley catheter was placed.  His abdomen was then prepped and  draped in the usual sterile fashion.  A time-out procedure confirming  patient and procedure were  performed.  A Veress needle was placed in the  left upper abdomen after insuring orogastric tube was in position.  After adequate pneumosufflation, I placed a 5mm trocar at the umbilicus  using the OptiVu.  All layers of the abdominal wall were visualized upon  entry.  I inspected the abdomen.  There was no evidence of injury upon  placement of the trocar.  The Veress needle was identified in the left  upper quadrant.  There appeared to be no injury.  No bleeding.  Veress  needle was removed.  I then placed a 5 mm trocar in the left side of the  abdomen under visualization with the camera.  An additional 5 mm trocar  was placed in the right.  I then made an incision in the epigastric  region and placed the handport.  The patient was placed in reverse  Trendelenburg and right side up.  I elevated the omentum to the  patient's head to reveal the transverse colon.  I did not see the  tattooing.  With palpation, I  felt the hard mass in the right colon near  the cecum.  I then began dissecting along the line of Toldt along the  abdominal wall.  Using the LigaSure, I dissected up towards the hepatic  flexure.  I fully moblized the hepatic flexure.  I then was able to  dissect the omentum away from the transverse colon to about the midway  portion.  I then continued mobilizing the cecum and appendix.  I stayed  close to the appendix and cecum and away from the lateral sidewall to  avoid injury to the ureter. Once I fully mobilized the colon and the  small bowel, I pulled this through the hand port for inspection.  It  seemed to come up without difficulty.  It had a lot of laxity at that  time.  I then brought the specimen out through the midline incision. I  was able to staple across the disal ileum using a 75 GIA stapler.  There  appeared to be some adhesions of some the diverticula of the colon and  the epiploica of the colon.  I ligated through these with the LigaSure  device.  The mass was  identified near the cecum.  I then chose an area  to the right of the middle colic vessels.  Using another load of the 75  stapler, I transected across the transverse colon.  Using the LigaSure  device, I transected through the mesentery insuring to stay low on the  mesentery and taking the right colic branch.  Specimens were handed off  the field.  There was no bleeding from the staple line.  A distal  segement of small bowel appears somewhat dusky;therefore, I removed  another 6 cm segment.  I then closed the enterotomy using the TA-60  stapler.  Final inspection of the staple lines were intact.  I elected  to place some omentum over the staple line.  I then irrigated the  abdomen with approximately  3 L of saline.  I once again placed the hand  port in place, reinsufflated the abdomen.  The staple line was intact.  There appeared to be no bleeding within the abdomen.  I placed the  omentum over the staple line and allowed the bowel to drop naturally in  place.  I then closed the defect with a #1 PDS suture in a running  fashion. This was closed in running fashion.  I then once again  reinsufflated the abdomen.  The closure was intact.  There was no  bleeding.  No evidence of injury.  The trocars were removed after  releasing pneumosufflation.  The port site was closed in layers using a  3.0 vicryl and skin closed using a 4.0 monocryl. Strips placed for final  dressing.  The patient was then extubated and transported to the  intensive care unit in stable condition.   He will be monitored, given a PCA for pain control.  I did inject the  sites with 0.25% Marcaine using approximately 30 mL of this.  He will  start clears tomorrow and I will maintain him on his home medication or  Metoprolol  and Flomax.      Lennie Muckle, MD  Electronically Signed     ALA/MEDQ  D:  03/01/2008  T:  03/01/2008  Job:  [id number]   cc:   Louanna Raw  Fax: [phone]   Everardo All. Madilyn Fireman, M.D.  Fax:  [phone]

## 2010-08-25 NOTE — Discharge Summary (Signed)
George [name], [name]               ACCOUNT NO.:  [id number]   MEDICAL RECORD NO.:  [id number]          PATIENT TYPE:  INP   LOCATION:  1524                         FACILITY:  [facility]   PHYSICIAN:  Lennie Muckle, MD      DATE OF BIRTH:  Mar 13, 1946   DATE OF ADMISSION:  03/01/2008  DATE OF DISCHARGE:  03/04/2008                               DISCHARGE SUMMARY   FINAL DIAGNOSIS:  Adenocarcinoma of the right colon.   CONDITION:  Stable on discharge.   PROCEDURE:  March 01, 2008 right hand-assisted hemicolectomy.   HOSPITAL COURSE:  Mr. George is a 65 year old male who was admitted after  he had had a laparoscopic hand-assisted right colon resection on  March 01, 2008.  He postoperatively has had no significant  complications.  He has had a mild drift in his hemoglobin/hematocrit  down to 8.0 and 24.9.  He has had some old blood noted with the 1st  bowel movement this morning, but only had minimal amount.  He is having  some incisional pain, but no overall significant reports of pain.  He  has been continued on his home medications of metoprolol, Wellbutrin,  Flomax, and Chromagen.  He has had some hypertension noted during this  hospitalization with blood pressure ranging from 158-178 over 94-107.  He has been afebrile.  He has been on a morphine PCA, and today is  changed over to Percocet for pain.  I have instructed him to call if he  has developed worsening abdominal pain.  Has continued blood noted with  his bowel movements.  I do suspect he will have a few blood stools the  next couple of days, and this should clear up.  I do not think he needs  a transfusion as is asymptomatic from his postoperative anemia.  He will  follow up with me in approximately 2 weeks' time.  I will call him at  home when his pathology is available.      Lennie Muckle, MD  Electronically Signed     ALA/MEDQ  D:  03/04/2008  T:  03/04/2008  Job:  [id number]   cc:   Everardo All. Madilyn Fireman, M.D.  Fax:  [phone]   Louanna Raw  Fax: [phone]

## 2010-08-25 NOTE — Discharge Summary (Signed)
George [name], George [name]               ACCOUNT NO.:  [id number]   MEDICAL RECORD NO.:  [id number]          PATIENT TYPE:  INP   LOCATION:  1527                         [facility]ACILITY:  [facility]   PHYSICIAN:  Lennie Muckle, MD      DATE OF BIRTH:  1945/06/26   DATE OF ADMISSION:  03/07/2008  DATE OF DISCHARGE:  03/11/2008                               DISCHARGE SUMMARY   FINAL DIAGNOSIS:  Postoperative ileus.   CONDITION ON DISCHARGE:  Good, condition resolved.   HOSPITAL COURSE:  George [name] was admitted on March 07, 2008 due to  having continued nausea and vomiting.  He was unable to keep liquids  down.  He had vague abdominal pain.  KUB on admission revealed dilated  small bowel loops consistent with an ileus.  He was admitted and  received aggressive IV fluids resuscitation.  He did have a slight rise  in his BUN and creatinine on admission which were noted to be 33 and  2.57 and with hydration he was decreased to 20 and 1.15.  His hemoglobin  and hematocrit are stable at 7.4 and 23.8.  White count is normal at  9.3. He has had no emesis since the first day of admission.  His abdomen  is soft, nontender, nondistended.  No fevers.  He has continued on all  his home medications including metoprolol and Flomax.  He is beginning  to have liquid stools which are non-foul smelling.  I think once he  starts his regular diet that this will thicken.  He will be discharged  home on his previous medications which include Percocet.  He can follow  up with me next Monday, March 18, 2008, which he has already made the  appointment.  He will call if he develops any recurrent nausea, vomiting  or fevers and I have instructed him to begin taking Imodium or Pepto  Bismol tomorrow if he continues to have the liquid stools to prevent  dehydration.      Lennie Muckle, MD  Electronically Signed     ALA/MEDQ  D:  03/11/2008  T:  03/11/2008  Job:  [id number]

## 2010-08-25 NOTE — Op Note (Signed)
George [name], SIEDSCHLAG               ACCOUNT NO.:  [id number]   MEDICAL RECORD NO.:  [id number]          PATIENT TYPE:  AMB   LOCATION:  ENDO                         FACILITY:  [facility]   PHYSICIAN:  John C. Madilyn Fireman, M.D.    DATE OF BIRTH:  September 13, 1945   DATE OF PROCEDURE:  01/25/2008  DATE OF DISCHARGE:                               OPERATIVE REPORT   PROCEDURE:  Colonoscopy with biopsy.   INDICATION FOR PROCEDURE:  Anemia and heme positive stools.   PROCEDURE IN DETAIL:  The patient was placed in the left lateral  decubitus position and placed on the pulse monitor with continuous low  flow oxygen delivered by nasal cannula.  He was sedated with 25 mcg IV  fentanyl and 2.5 mg IV Versed in addition to the medicine given for the  previous EGD.  The Olympus video colonoscope was inserted into the  rectum and advanced its entire length.  The colon was tortuous and  elongated and it was difficult getting around to the furthest point of  insertion; with multiple torquing maneuvers and the scope inserted its  entire length with strong abdominal pressure I was able to barely reach  the point where there was a circumferential mass.  I could not see  clearly beyond it so it was not clear where it was located but due to  the far advancement of the scope and the insertion of its entire length  it was presumed to be in the ascending colon although I could not be  absolutely certain about this.  I could not see any cecal landmarks.  The lesion was circumferential with some open lumen through it although  I could not get the scope to that point to see if it would advance  beyond it.  I was able to take biopsies.  It had a typical appearance of  carcinoma.  The scope was then withdrawn and the entire mucosa distal to  this lesion including all visualized portions of the transverse,  descending, sigmoid and rectum all the way down to the anus appeared  normal with no further masses, polyps, diverticula or  other mucosal  abnormalities.  The scope was then withdrawn and the patient returned to  the recovery room in stable condition.  He tolerated the procedure well.  There were no immediate complications.   IMPRESSION:  Colon mass, presumably in the ascending colon consistent  with carcinoma.   PLAN:  Await biopsy results.  Will transfuse because of his hemoglobin  of 7.7 and will need surgical referral.           ______________________________  Everardo All. Madilyn Fireman, M.D.     JCH/MEDQ  D:  01/25/2008  T:  01/25/2008  Job:  [id number]   cc:   Louanna Raw  Fax: [phone]

## 2010-08-25 NOTE — Op Note (Signed)
NAMEDAMYEN, KNOLL               ACCOUNT NO.:  [id number]   MEDICAL RECORD NO.:  [id number]          PATIENT TYPE:  AMB   LOCATION:  ENDO                         [facility]ACILITY:  [facility]   PHYSICIAN:  John C. Madilyn Fireman, M.D.    DATE OF BIRTH:  05-06-1945   DATE OF PROCEDURE:  01/25/2008  DATE OF DISCHARGE:                               OPERATIVE REPORT   INDICATIONS FOR PROCEDURE:  Anemia and heme-positive stools.   PROCEDURE:  The patient was placed in the left lateral decubitus  position and placed on the pulse monitor with continuous low-flow oxygen  delivered by nasal cannula.  He was sedated with 100 mcg IV fentanyl and  7.5 mg IV Versed.  The Olympus video endoscope was advanced under direct  vision into the oropharynx and esophagus.  The esophagus was straight  and of normal caliber with the squamocolumnar line at 38 cm.  There is  no visible hiatal hernia ring stricture or other abnormality of the GE  junction.  The stomach was entered and a small amount of liquid  secretions were suctioned from the fundus.  Retroflexed view of the  cardia was unremarkable.  The fundus, body, antrum and pylorus all  appeared normal.  The duodenum was entered, both bulb and second portion  were well inspected and appeared to be within normal limits.  The scope  was then withdrawn and the patient was returned to the recovery room in  stable condition.  He tolerated the procedure well.  There were no  immediate complications.   IMPRESSION:  Normal endoscopy.   PLAN:  Will proceed with colonoscopy.           ______________________________  Everardo All. Madilyn Fireman, M.D.     JCH/MEDQ  D:  01/25/2008  T:  01/25/2008  Job:  [id number]   cc:   Louanna Raw  Fax: [phone]

## 2011-01-12 LAB — CBC
HCT: 23.8 — ABNORMAL LOW
HCT: 24.3 — ABNORMAL LOW
HCT: 29.2 — ABNORMAL LOW
HCT: 31.1 — ABNORMAL LOW
Hemoglobin: 7.7 — CL
Hemoglobin: 7.4 — CL
Hemoglobin: 8.8 — ABNORMAL LOW
Hemoglobin: 9.1 — ABNORMAL LOW
Hemoglobin: 9.9 — ABNORMAL LOW
MCHC: 31.2
MCHC: 31.8
MCHC: 31.8
MCHC: 32.1
MCV: 76.9 — ABNORMAL LOW
MCV: 77.5 — ABNORMAL LOW
Platelets: 222
Platelets: 253
Platelets: 264
RBC: 4.24
RBC: 3.07 — ABNORMAL LOW
RBC: 3.54 — ABNORMAL LOW
RBC: 3.79 — ABNORMAL LOW
RBC: 4.05 — ABNORMAL LOW
RDW: 26.3 — ABNORMAL HIGH
RDW: 27.1 — ABNORMAL HIGH
RDW: 28.3 — ABNORMAL HIGH
RDW: 28.3 — ABNORMAL HIGH
WBC: 7.4
WBC: 13.2 — ABNORMAL HIGH
WBC: 17.9 — ABNORMAL HIGH

## 2011-01-12 LAB — BASIC METABOLIC PANEL
BUN: 8
CO2: 20
CO2: 23
Calcium: 8.1 — ABNORMAL LOW
Calcium: 8.8
Chloride: 107
Chloride: 110
Creatinine, Ser: 2.13 — ABNORMAL HIGH
GFR calc Af Amer: 38 — ABNORMAL LOW
GFR calc Af Amer: >60
GFR calc non Af Amer: 31 — ABNORMAL LOW
Glucose, Bld: 111 — ABNORMAL HIGH
Potassium: 3.5
Potassium: 3.9
Sodium: 134 — ABNORMAL LOW
Sodium: 135

## 2011-01-12 LAB — DIFFERENTIAL
Basophils Absolute: 0
Basophils Relative: 0
Eosinophils Absolute: 0.4
Monocytes Relative: 7
Neutro Abs: 13.3 — ABNORMAL HIGH
Neutrophils Relative %: 83 — ABNORMAL HIGH

## 2011-01-12 LAB — URINALYSIS, ROUTINE W REFLEX MICROSCOPIC
Nitrite: NEGATIVE
Protein, ur: 30 — AB
Specific Gravity, Urine: 1.034 — ABNORMAL HIGH
Urobilinogen, UA: 0.2

## 2011-01-12 LAB — CROSSMATCH
Antibody Screen: NEGATIVE
Antibody Screen: NEGATIVE

## 2011-01-12 LAB — URINE MICROSCOPIC-ADD ON

## 2011-01-12 LAB — COMPREHENSIVE METABOLIC PANEL
ALT: 27
Albumin: 3.4 — ABNORMAL LOW
Alkaline Phosphatase: 70
Alkaline Phosphatase: 72
BUN: 33 — ABNORMAL HIGH
BUN: 8
CO2: 21
GFR calc non Af Amer: 25 — ABNORMAL LOW
Glucose, Bld: 157 — ABNORMAL HIGH
Potassium: 3.4 — ABNORMAL LOW
Potassium: 4
Sodium: 142
Total Protein: 7.1
Total Protein: 7.4

## 2011-01-12 LAB — HEMOGLOBIN AND HEMATOCRIT, BLOOD: HCT: 37.3 — ABNORMAL LOW

## 2011-01-12 LAB — ABO/RH: ABO/RH(D): B POS

## 2011-01-12 LAB — LIPASE, BLOOD: Lipase: 46

## 2011-01-15 LAB — GLUCOSE, CAPILLARY: Glucose-Capillary: 107 mg/dL — ABNORMAL HIGH (ref 70–99)

## 2011-01-15 LAB — BASIC METABOLIC PANEL
Chloride: 106 mEq/L (ref 96–112)
GFR calc non Af Amer: >60 mL/min (ref >60)
Potassium: 5.5 mEq/L — ABNORMAL HIGH (ref 3.5–5.1)
Sodium: 138 mEq/L (ref 135–145)

## 2011-01-15 LAB — PROTIME-INR: Prothrombin Time: 12.9 seconds (ref 11.6–15.2)

## 2011-01-15 LAB — APTT: aPTT: 25 seconds (ref 24–37)

## 2011-04-21 ENCOUNTER — Encounter (INDEPENDENT_AMBULATORY_CARE_PROVIDER_SITE_OTHER): Payer: Self-pay | Admitting: General Surgery

## 2011-05-04 ENCOUNTER — Other Ambulatory Visit: Payer: Self-pay | Admitting: Urology

## 2011-05-27 ENCOUNTER — Ambulatory Visit (HOSPITAL_BASED_OUTPATIENT_CLINIC_OR_DEPARTMENT_OTHER): Admission: RE | Admit: 2011-05-27 | Payer: Medicare Other | Source: Ambulatory Visit | Admitting: Urology

## 2011-05-27 ENCOUNTER — Encounter (HOSPITAL_BASED_OUTPATIENT_CLINIC_OR_DEPARTMENT_OTHER): Admission: RE | Payer: Self-pay | Source: Ambulatory Visit

## 2011-05-27 SURGERY — EXCISION, SPERMATOCELE
Anesthesia: Choice | Laterality: Left

## 2014-02-10 DIAGNOSIS — H6980 Other specified disorders of Eustachian tube, unspecified ear: Secondary | ICD-10-CM | POA: Diagnosis not present

## 2014-02-10 DIAGNOSIS — H9319 Tinnitus, unspecified ear: Secondary | ICD-10-CM | POA: Diagnosis not present

## 2014-04-15 ENCOUNTER — Ambulatory Visit: Payer: Medicare Other | Admitting: Radiation Oncology

## 2014-04-15 ENCOUNTER — Ambulatory Visit: Payer: Medicare Other

## 2014-04-22 ENCOUNTER — Inpatient Hospital Stay
Admission: RE | Admit: 2014-04-22 | Discharge: 2014-04-22 | Disposition: A | Discharge status: Home / Self Care | Payer: Medicare Other | Source: Ambulatory Visit | Attending: Radiation Oncology | Admitting: Radiation Oncology

## 2014-04-22 ENCOUNTER — Ambulatory Visit
Admission: RE | Admit: 2014-04-22 | Discharge: 2014-04-22 | Disposition: A | Discharge status: Home / Self Care | Payer: Medicare Other | Source: Ambulatory Visit | Attending: Radiation Oncology | Admitting: Radiation Oncology

## 2014-04-22 ENCOUNTER — Other Ambulatory Visit: Payer: Self-pay | Admitting: Urology

## 2014-04-22 ENCOUNTER — Encounter: Payer: Self-pay | Admitting: Radiation Oncology

## 2014-04-22 VITALS — BP 123/88 | HR 73 | Temp 97.6°F | Resp 20 | Ht 72.0 in | Wt 199.2 lb

## 2014-04-22 DIAGNOSIS — C61 Malignant neoplasm of prostate: Secondary | ICD-10-CM | POA: Diagnosis not present

## 2014-04-22 NOTE — Progress Notes (Signed)
Radiation Oncology         [phone]) [phone] ________________________________  Initial outpatient Consultation  Name: George [name]. MRN: [id number]  Date: 04/22/2014  DOB: 1945/10/31  CC:No primary care provider on file.  Malka So, MD   REFERRING PHYSICIAN: Malka So, MD  DIAGNOSIS: 69 y.o. gentleman with stage T2a adenocarcinoma of the prostate with a Gleason's score of 3+3 and a PSA of 4.47    ICD-9-CM ICD-10-CM   1. Prostate cancer 185 C61   2. Malignant neoplasm of prostate 185 C61     HISTORY OF PRESENT ILLNESS::George [name]. is a 69 y.o. gentleman.  He was noted to have an elevated PSA of 4.47 by his primary care physician.  Accordingly, he was referred for evaluation in urology by Dr. Jeffie Pollock and digital rectal examination was performed at that time revealing a nodular prostate gland.  The patient proceeded to transrectal ultrasound with 12 biopsies of the prostate.  The prostate volume measured 55 cc.  Out of 12 core biopsies, 9 were positive.  The maximum Gleason score was 3+3.  The patient reviewed the biopsy results with his urologist and he has kindly been referred today for discussion of potential radiation treatment options.  PREVIOUS RADIATION THERAPY: No  PAST MEDICAL HISTORY:  has a past medical history of Elevated prostate specific antigen (PSA); Hematospermia; Incomplete bladder emptying; Nodular prostate with lower urinary tract symptoms; Malignant neoplasm of prostate; Prostatitis; Spermatocele; Weak urinary stream; antineoplastic chemotherapy; Colon cancer; Anxiety; Depression; Hypertension; and Idiopathic peripheral neuropathy.    PAST SURGICAL HISTORY: Past Surgical History  Procedure Laterality Date  . Colon resection  2009  . Prostate biopsy      FAMILY HISTORY: family history includes Cancer in his brother; Stroke in his father.  SOCIAL HISTORY:  reports that he quit smoking about 42 years ago. He has never used smokeless tobacco. He reports  that he does not drink alcohol or use illicit drugs.  ALLERGIES: Amlodipine  MEDICATIONS:  Current Outpatient Prescriptions  Medication Sig Dispense Refill  . buPROPion (WELLBUTRIN) 100 MG tablet Take 450 mg by mouth 2 (two) times daily.     Marland Kitchen gabapentin (NEURONTIN) 100 MG capsule Take 100 mg by mouth 3 (three) times daily.    Marland Kitchen losartan (COZAAR) 100 MG tablet Take 100 mg by mouth daily.    . metoprolol (LOPRESSOR) 100 MG tablet Take 100 mg by mouth 2 (two) times daily.    . potassium chloride (K-DUR) 10 MEQ tablet Take 10 mEq by mouth daily.    . tamsulosin (FLOMAX) 0.4 MG CAPS capsule Take 0.4 mg by mouth.    Marland Kitchen atorvastatin (LIPITOR) 20 MG tablet Take 20 mg by mouth daily.    . felodipine (PLENDIL) 5 MG 24 hr tablet Take 5 mg by mouth daily.     No current facility-administered medications for this encounter.    REVIEW OF SYSTEMS:  A 15 point review of systems is documented in the electronic medical record. This was obtained by the nursing staff. However, I reviewed this with the patient to discuss relevant findings and make appropriate changes.  A comprehensive review of systems was negative..  The patient completed an IPSS and IIEF questionnaire.  His IPSS score was 21 indicating severe urinary outflow obstructive symptoms.  He indicated that his erectile function is able to complete sexual activity on every attempt.   PHYSICAL EXAM: This patient is in no acute distress.  He is alert and oriented.   height is  6' (1.829 m) and weight is 199 lb 3.2 oz (90.357 kg). His oral temperature is 97.6 F (36.4 C). His blood pressure is 123/88 and his pulse is 73. His respiration is 20.  He exhibits no respiratory distress or labored breathing.  He appears neurologically intact.  His mood is pleasant.  His affect is appropriate.  Please note the digital rectal exam findings described above.  KPS = 100  100 - Normal; no complaints; no evidence of disease. 90   - Able to carry on normal activity;  minor signs or symptoms of disease. 80   - Normal activity with effort; some signs or symptoms of disease. 30   - Cares for self; unable to carry on normal activity or to do active work. 60   - Requires occasional assistance, but is able to care for most of his personal needs. 50   - Requires considerable assistance and frequent medical care. 55   - Disabled; requires special care and assistance. 50   - Severely disabled; hospital admission is indicated although death not imminent. 56   - Very sick; hospital admission necessary; active supportive treatment necessary. 10   - Moribund; fatal processes progressing rapidly. 0     - Dead  Karnofsky DA, Abelmann [place], Craver LS and Burchenal [facility] [phone]) The use of the nitrogen mustards in the palliative treatment of carcinoma: with particular reference to bronchogenic carcinoma Cancer 1 634-56   LABORATORY DATA:  Lab Results  Component Value Date   WBC 6.8 09/18/2009   HGB 13.2 09/18/2009   HCT 39.7 09/18/2009   MCV 89.0 09/18/2009   PLT 320 09/18/2009   Lab Results  Component Value Date   NA 140 09/18/2009   K 4.8 09/18/2009   CL 107 09/18/2009   CO2 27 09/18/2009   Lab Results  Component Value Date   ALT 20 09/10/2009   AST 31 09/10/2009   ALKPHOS 76 09/10/2009   BILITOT 0.4 09/10/2009     RADIOGRAPHY: No results found.    IMPRESSION: This gentleman is a 69 y.o. gentleman with stage T2a adenocarcinoma of the prostate with a Gleason's score of 3+3 and a PSA of 4.47.  His T-Stage, Gleason's Score, and PSA put him into the favorable risk group.  Accordingly he is eligible for a variety of potential treatment options including surveillance, prostatectomy, or external radiation.  He is not an ideal seed implant candidate given his IPSS.  PLAN:Today I reviewed the findings and workup thus far.  We discussed the natural history of prostate cancer.  We reviewed the the implications of T-stage, Gleason's Score, and PSA on decision-making and  outcomes in prostate cancer.  We discussed radiation treatment in the management of prostate cancer with regard to the logistics and delivery of external beam radiation treatment as well as the logistics and delivery of prostate brachytherapy.  We compared and contrasted each of these approaches and also compared these against prostatectomy.  The patient expressed interest in external beam radiotherapy.  I filled out a patient counseling form for him with relevant treatment diagrams and we retained a copy for our records.   The patient would like to proceed with TURP followed by prostate IMRT.  I will share my findings with Dr. Jeffie Pollock and move forward with scheduling placement of three gold fiducial markers into the prostate to proceed with IMRT at least 3 months after TURP.     I enjoyed meeting with him today, and will look forward to participating in  the care of this very nice gentleman.   I spent 60 minutes face to face with the patient and more than 50% of that time was spent in counseling and/or coordination of care.   ------------------------------------------------  Sheral Apley. Tammi Klippel, M.D.

## 2014-04-22 NOTE — Progress Notes (Signed)
GU Location of Tumor / Histology: prostatic adenocarcinoma  If Prostate Cancer, Gleason Score is (3 + 3) and PSA is (4.47)  Biopsies of prostate (if applicable) revealed:     Past/Anticipated interventions by urology, if any: prostate biopsy, referral to radiation oncology, preparatory TURP  Past/Anticipated interventions by medical oncology, if any: no  Weight changes, if any: no  Bowel/Bladder complaints, if any: Very poor urine flow, incomplete bladder emptying, urinary frequency, nocturia x 1, denies erectile dysfunction - IPSS score of 21, denies any bowel issues  Nausea/Vomiting, if any: no  Pain issues, if any:  Has neuropathy in his feet from chemotherapy - takes gabapentin 3 times a day - reports the pain keeps him awake at night.  SAFETY ISSUES:  Prior radiation? no  Pacemaker/ICD? no  Possible current pregnancy? no  Is the patient on methotrexate? no  Current Complaints / other details:  69 year old male. Unemployed. Single. 55 cc prostate volume. Hx of colon ca but, no hx of xrt.  IPSS score of 21.

## 2014-04-22 NOTE — Progress Notes (Signed)
GU Location of Tumor / Histology: prostatic adenocarcinoma  If Prostate Cancer, Gleason Score is (3 + 3) and PSA is (4.47)  Biopsies of prostate (if applicable) revealed:     Past/Anticipated interventions by urology, if any: prostate biopsy, referral to radiation oncology, preparatory TURP  Past/Anticipated interventions by medical oncology, if any: no  Weight changes, if any: no  Bowel/Bladder complaints, if any: Very poor urine flow, incomplete bladder emptying, urinary frequency, nocturia, denies erectile dysfunction  Nausea/Vomiting, if any: no  Pain issues, if any:  no  SAFETY ISSUES:  Prior radiation? no  Pacemaker/ICD? no  Possible current pregnancy? no  Is the patient on methotrexate? no  Current Complaints / other details:  68 year old male. Unemployed. Single. 55 cc prostate volume. Hx of colon ca but, no hx of xrt.

## 2014-04-22 NOTE — Progress Notes (Signed)
Please see the Nurse Progress Note in the MD Initial Consult Encounter for this patient. 

## 2014-04-23 ENCOUNTER — Encounter: Payer: Self-pay | Admitting: *Deleted

## 2014-04-23 NOTE — Progress Notes (Signed)
[place] Psychosocial Distress Screening Clinical Social Work  Clinical Social Work was referred by distress screening protocol.  The patient scored a 8 on the Psychosocial Distress Thermometer which indicates severe distress. Clinical Social Worker contacted patient by phone to assess for distress and other psychosocial needs. [name] reported he has no concerns at this time.  CSW discussed insurance and emotional concerns with patient.  [name] stated insurance is no longer a concern- he was previously worried about out of pocket costs of treatment, but he now has a supplemental plan.  The patient also indicated he has dealt with depression for a very long time and is currently taking Wellbutrin.  CSW strongly encouraged patient to utilize [facility] support services (CSW counseling, counseling interns, and prostate support group) if he is interested or if he feels emotional symptoms are not well managed with medication only.  Patient verbalized understanding and agreed to follow up as needed.  ONCBCN DISTRESS SCREENING 04/22/2014  Screening Type Initial Screening  Distress experienced in past week (1-10) 8  Practical problem type Insurance  Family Problem type Other (comment)  Emotional problem type Depression;Nervousness/Anxiety;Adjusting to illness;Isolation/feeling alone  Spiritual/Religous concerns type Facing my mortality  Physical Problem type Sleep/insomnia;Tingling hands/feet;Skin dry/itchy  Physician notified of physical symptoms Yes  Referral to clinical psychology   Referral to clinical social work     Clinical Social Worker follow up needed: No.  If yes, follow up plan:  George [name], MSW, LCSW, OSW-C Clinical Social Worker [facility] [phone]

## 2014-04-26 DIAGNOSIS — E785 Hyperlipidemia, unspecified: Secondary | ICD-10-CM | POA: Diagnosis not present

## 2014-04-26 DIAGNOSIS — I10 Essential (primary) hypertension: Secondary | ICD-10-CM | POA: Diagnosis not present

## 2014-04-26 DIAGNOSIS — C61 Malignant neoplasm of prostate: Secondary | ICD-10-CM | POA: Diagnosis not present

## 2014-04-26 DIAGNOSIS — F329 Major depressive disorder, single episode, unspecified: Secondary | ICD-10-CM | POA: Diagnosis not present

## 2014-04-29 ENCOUNTER — Encounter (HOSPITAL_COMMUNITY): Payer: Self-pay

## 2014-04-29 DIAGNOSIS — E785 Hyperlipidemia, unspecified: Secondary | ICD-10-CM | POA: Diagnosis not present

## 2014-05-01 ENCOUNTER — Other Ambulatory Visit (HOSPITAL_COMMUNITY): Payer: Self-pay | Admitting: *Deleted

## 2014-05-01 NOTE — Patient Instructions (Addendum)
George [name].  05/01/2014   Your procedure is scheduled on: Tuesday January 26th, 2016  Report to [facility] Main  Entrance and follow signs to               George [name] at 700 AM.  Call this number if you have problems the morning of surgery [phone]   Remember:  Do not eat food or drink liquids :After Midnight.     Take these medicines the morning of surgery with A SIP OF WATER: Wellbutrin, Gabapentin, Metoprolol                               You may not have any metal on your body including hair pins and              piercings  Do not wear jewelry, make-up, lotions, powders or perfumes.             Do not wear nail polish.  Do not shave  48 hours prior to surgery.              Men may shave face and neck.   Do not bring valuables to the hospital. [place].  Contacts, dentures or bridgework may not be worn into surgery.  Leave suitcase in the car. After surgery it may be brought to your room.     Patients discharged the day of surgery will not be allowed to drive home.  Name and phone number of your driver:  Special Instructions: N/A              Please read over the following fact sheets you were given: _____________________________________________________________________             [facility] - Preparing for Surgery Before surgery, you can play an important role.  Because skin is not sterile, your skin needs to be as free of germs as possible.  You can reduce the number of germs on your skin by washing with CHG (chlorahexidine gluconate) soap before surgery.  CHG is an antiseptic cleaner which kills germs and bonds with the skin to continue killing germs even after washing. Please DO NOT use if you have an allergy to CHG or antibacterial soaps.  If your skin becomes reddened/irritated stop using the CHG and inform your nurse when you arrive at Short Stay. Do not shave (including legs  and underarms) for at least 48 hours prior to the first CHG shower.  You may shave your face/neck. Please follow these instructions carefully:  1.  Shower with CHG Soap the night before surgery and the  morning of Surgery.  2.  If you choose to wash your hair, wash your hair first as usual with your  normal  shampoo.  3.  After you shampoo, rinse your hair and body thoroughly to remove the  shampoo.                           4.  Use CHG as you would any other liquid soap.  You can apply chg directly  to the skin and wash  Gently with a scrungie or clean washcloth.  5.  Apply the CHG Soap to your body ONLY FROM THE NECK DOWN.   Do not use on face/ open                           Wound or open sores. Avoid contact with eyes, ears mouth and genitals (private parts).                       Wash face,  Genitals (private parts) with your normal soap.             6.  Wash thoroughly, paying special attention to the area where your surgery  will be performed.  7.  Thoroughly rinse your body with warm water from the neck down.  8.  DO NOT shower/wash with your normal soap after using and rinsing off  the CHG Soap.                9.  Pat yourself dry with a clean towel.            10.  Wear clean pajamas.            11.  Place clean sheets on your bed the night of your first shower and do not  sleep with pets. Day of Surgery : Do not apply any lotions/deodorants the morning of surgery.  Please wear clean clothes to the hospital/surgery center.  FAILURE TO FOLLOW THESE INSTRUCTIONS MAY RESULT IN THE CANCELLATION OF YOUR SURGERY PATIENT SIGNATURE_________________________________  NURSE SIGNATURE__________________________________  ________________________________________________________________________

## 2014-05-02 ENCOUNTER — Ambulatory Visit (HOSPITAL_COMMUNITY)
Admission: RE | Admit: 2014-05-02 | Discharge: 2014-05-02 | Disposition: A | Discharge status: Home / Self Care | Payer: Medicare Other | Source: Ambulatory Visit | Attending: Anesthesiology | Admitting: Anesthesiology

## 2014-05-02 ENCOUNTER — Encounter (HOSPITAL_COMMUNITY)
Admission: RE | Admit: 2014-05-02 | Discharge: 2014-05-02 | Disposition: A | Discharge status: Home / Self Care | Payer: Medicare Other | Source: Ambulatory Visit | Attending: Urology | Admitting: Urology

## 2014-05-02 ENCOUNTER — Encounter (HOSPITAL_COMMUNITY): Payer: Self-pay

## 2014-05-02 DIAGNOSIS — Z01818 Encounter for other preprocedural examination: Secondary | ICD-10-CM | POA: Diagnosis not present

## 2014-05-02 DIAGNOSIS — I10 Essential (primary) hypertension: Secondary | ICD-10-CM

## 2014-05-02 LAB — CBC
HCT: 43.3 % (ref 39.0–52.0)
Hemoglobin: 13.9 g/dL (ref 13.0–17.0)
MCH: 28.8 pg (ref 26.0–34.0)
MCHC: 32.1 g/dL (ref 30.0–36.0)
MCV: 89.6 fL (ref 78.0–100.0)
Platelets: 336 10*3/uL (ref 150–400)
RBC: 4.83 MIL/uL (ref 4.22–5.81)
RDW: 13.3 % (ref 11.5–15.5)
WBC: 7.6 10*3/uL (ref 4.0–10.5)

## 2014-05-02 NOTE — Progress Notes (Signed)
cm[facility] 04-29-2014 dr Tina Griffiths [place] on chart

## 2014-05-06 NOTE — H&P (Signed)
Active Problems Problems  1. Elevated prostate specific antigen (PSA) (R97.2) 2. Hematospermia (R36.1) 3. Incomplete bladder emptying (R33.9) 4. Nodular prostate with lower urinary tract symptoms (N40.3) 5. Prostate cancer (C61) 6. Prostatitis (N41.9) 7. Spermatocele (N43.40) 8. Weak urinary stream (R39.12)  History of Present Illness George [name] returns today in f/u from his prostate biopsy for a PSA of 4.47 and LUTS.  He was found to have T2a Nx Mx Gleason 6 disease in 9/12 cores in a 78ml prostate gland. His IPSS is 21 and on cystoscopy he has a trilobar prostate with obstruction and a very poor flow.  He has good erectile function with a SHIM of 23. His CAPRA score is 2. He has a history of colon cancer with resection of a right cecal mass by hand assisted laparoscopy in 2009. He was found to have positive nodes and was given chemo. He last had oncology f/u in 2012. He last was scanned in 2011.  He didn't have radiation therapy. He also has a symptomatic left spermatocele and is interested in therapy.   Past Medical History Problems  1. History of Anxiety (F41.9) 2. History of Colon Cancer 3. History of depression (Z86.59) 4. History of hypertension (Z86.79) 5. History of Idiopathic peripheral neuropathy (G60.9)  Surgical History Problems  1. History of Colon Surgery  Current Meds 1. Atorvastatin Calcium 20 MG Oral Tablet;  Therapy: (Recorded:02Sep2015) to Recorded 2. Felodipine ER 5 MG Oral Tablet Extended Release 24 Hour;  Therapy: (Recorded:18Nov2015) to Recorded 3. Flomax 0.4 MG Oral Capsule;  Therapy: (Recorded:16Jan2013) to Recorded 4. Gabapentin TABS;  Therapy: (Recorded:16Jan2013) to Recorded 5. Klor-Con TBCR;  Therapy: (Recorded:02Sep2015) to Recorded 6. Losartan Potassium 100 MG Oral Tablet;  Therapy: (Recorded:02Sep2015) to Recorded 7. Metoprolol Tartrate 100 MG Oral Tablet;  Therapy: (Recorded:16Jan2013) to Recorded 8. Wellbutrin TABS;  Therapy:  (Recorded:16Jan2013) to Recorded  Allergies Medication  1. amlodipine  Family History Problems  1. Family history of Death of family member : Father, Brother 2. Family history of Family Health Status - Mother's Age   age 47 3. Family history of Family Health Status Number Of Children   2 sons 7 daughters 39. Family history of malignant neoplasm of brain (Z80.8) : Brother 67. Family history of stroke (Z82.3) : Father  Social History Problems  1. Denied: History of Alcohol Use 2. Denied: History of Caffeine Use 3. Marital History - Divorced 4. Never A Smoker 5. Occupation   Unemployed 76. Single  Review of Systems Constitutional, cardiovascular, pulmonary and gastrointestinal system(s) were reviewed and pertinent findings if present are noted and are otherwise negative.  Genitourinary: urinary frequency, nocturia and weak urinary stream, but no erectile dysfunction.    Physical Exam Constitutional: Well nourished and well developed . No acute distress.  Pulmonary: No respiratory distress and normal respiratory rhythm and effort.  Cardiovascular: Heart rate and rhythm are normal . No peripheral edema.    Results/Data  The following clinical lab reports were reviewed:  Path report reviewed.  The following medical tests were reviewed: CAPRA score calculated.  IPSS: The IPSS today is 21   SHIM: The SHIM score today is 23 .    Assessment Assessed  1. Nodular prostate with lower urinary tract symptoms (N40.3) 2. Prostate cancer (C61) 3. Spermatocele (N43.40)  He has a T2a Nx Mx Gleason 6 prostate cancer in 9/12 cores and has a nodular prostate with LUTS and incomplete emptying.  He also has a symptomatic left spermatocele that he would like corrected.   Plan  Nodular prostate with lower urinary tract symptoms  1. Follow-up Schedule Surgery Office  Follow-up  Status: Complete  Done: [id number] Prostate cancer  2. Radiation Oncology Referral Referral  Referral  Status:  Hold For - Appointment,Records   Requested for: 03Dec2015  I spent about 55 min with the patient with 45 min in face to face discussion regarding his prostate cancer treatment options.     I reviewed the risks and benefits of active surveillance, but with his disease burden I don't think that is his best option.    I reviewed radical prostatectomy with an emphasis on RALP with the understanding that open conversion would be possible with his prior surgery if he has a lot of adhesion.  I reviewed the risks of bleeding, infection, injury to adjacent organs with need for corrective surgery, hernias, urine leaks, strictures, incontinence, erectile dysfunction, barotrauma, thrombotic events and anesthetic complications.  I reviewed the hospital course and recovery time.  He would be a good candidate for surgery because of his disease state and conexisting severe LUTS with an obstructing middle lobe as he would get relief of obstruction and a good cancer treatment.     I discussed radiation therapy including EXRT and Brachytherapy and explained that he would need to have a procedure to relieve prostatic obstuction prior to being considered for either and I suggested that a TURP would be most appropriate. I reviewed the risks of radiation including fatigue and skin changes with EXRT along with bladder, prostate and rectal complications with both options and anesthetic risks with the seeds.  I reviewed the risks of TURP including bleeding, infection, fluid overload, strictures and incontinence which are both potentiated by the radiation at times, ejaculatory dysfunction, erectile dysfunction, thrombotic event and anesthetic risks.     I briefly mentioned cryo and HIFU but don't feel they are his best options because of his anatomy.     He is most interested in pursuing radiation therapy with a preparatory TURP and would also like to have the left spermatocele resected. I will get him set up to see  radiation oncology and will post the surgery. He understands he will need 3 months at a minimum of healing post TURP prior to the radiation. I will not do an aggressive TURP and will primarily resect the middle lobe.   Discussion/Summary CC: Dr. London Pepper.

## 2014-05-07 ENCOUNTER — Ambulatory Visit (HOSPITAL_COMMUNITY): Payer: Medicare Other | Admitting: Certified Registered Nurse Anesthetist

## 2014-05-07 ENCOUNTER — Encounter (HOSPITAL_COMMUNITY): Payer: Self-pay | Admitting: *Deleted

## 2014-05-07 ENCOUNTER — Encounter (HOSPITAL_COMMUNITY)
Admission: RE | Disposition: A | Discharge status: Home / Self Care | Payer: Self-pay | Source: Ambulatory Visit | Attending: Urology

## 2014-05-07 ENCOUNTER — Observation Stay (HOSPITAL_COMMUNITY)
Admission: RE | Admit: 2014-05-07 | Discharge: 2014-05-08 | Disposition: A | Discharge status: Home / Self Care | Payer: Medicare Other | Source: Ambulatory Visit | Attending: Urology | Admitting: Urology

## 2014-05-07 DIAGNOSIS — R3912 Poor urinary stream: Secondary | ICD-10-CM | POA: Insufficient documentation

## 2014-05-07 DIAGNOSIS — N138 Other obstructive and reflux uropathy: Secondary | ICD-10-CM | POA: Diagnosis not present

## 2014-05-07 DIAGNOSIS — F329 Major depressive disorder, single episode, unspecified: Secondary | ICD-10-CM | POA: Insufficient documentation

## 2014-05-07 DIAGNOSIS — G609 Hereditary and idiopathic neuropathy, unspecified: Secondary | ICD-10-CM | POA: Diagnosis not present

## 2014-05-07 DIAGNOSIS — C61 Malignant neoplasm of prostate: Principal | ICD-10-CM | POA: Diagnosis present

## 2014-05-07 DIAGNOSIS — R3914 Feeling of incomplete bladder emptying: Secondary | ICD-10-CM | POA: Insufficient documentation

## 2014-05-07 DIAGNOSIS — F419 Anxiety disorder, unspecified: Secondary | ICD-10-CM | POA: Insufficient documentation

## 2014-05-07 DIAGNOSIS — N401 Enlarged prostate with lower urinary tract symptoms: Secondary | ICD-10-CM | POA: Diagnosis not present

## 2014-05-07 DIAGNOSIS — Z79899 Other long term (current) drug therapy: Secondary | ICD-10-CM | POA: Diagnosis not present

## 2014-05-07 DIAGNOSIS — Z85038 Personal history of other malignant neoplasm of large intestine: Secondary | ICD-10-CM | POA: Insufficient documentation

## 2014-05-07 DIAGNOSIS — I10 Essential (primary) hypertension: Secondary | ICD-10-CM | POA: Diagnosis not present

## 2014-05-07 DIAGNOSIS — N4340 Spermatocele of epididymis, unspecified: Secondary | ICD-10-CM | POA: Diagnosis not present

## 2014-05-07 DIAGNOSIS — Z888 Allergy status to other drugs, medicaments and biological substances status: Secondary | ICD-10-CM | POA: Diagnosis not present

## 2014-05-07 DIAGNOSIS — N320 Bladder-neck obstruction: Secondary | ICD-10-CM | POA: Diagnosis not present

## 2014-05-07 DIAGNOSIS — R972 Elevated prostate specific antigen [PSA]: Secondary | ICD-10-CM | POA: Diagnosis not present

## 2014-05-07 DIAGNOSIS — N403 Nodular prostate with lower urinary tract symptoms: Secondary | ICD-10-CM | POA: Diagnosis not present

## 2014-05-07 DIAGNOSIS — N400 Enlarged prostate without lower urinary tract symptoms: Secondary | ICD-10-CM | POA: Diagnosis not present

## 2014-05-07 HISTORY — PX: TRANSURETHRAL RESECTION OF PROSTATE: SHX73

## 2014-05-07 HISTORY — PX: SPERMATOCELECTOMY: SHX2420

## 2014-05-07 SURGERY — TRANSURETHRAL RESECTION OF THE PROSTATE WITH GYRUS INSTRUMENTS
Anesthesia: General | Site: Scrotum

## 2014-05-07 MED ORDER — CIPROFLOXACIN HCL 500 MG PO TABS
500.0000 mg | ORAL_TABLET | Freq: Two times a day (BID) | ORAL | Status: DC
  Administered 2014-05-07 – 2014-05-08 (×2): 500 mg via ORAL
  Filled 2014-05-07 (×6): qty 1

## 2014-05-07 MED ORDER — SODIUM CHLORIDE 0.9 % IR SOLN
Status: DC | PRN
  Administered 2014-05-07 (×4): 3000 mL

## 2014-05-07 MED ORDER — MIDAZOLAM HCL 2 MG/2ML IJ SOLN
INTRAMUSCULAR | Status: AC
  Filled 2014-05-07: qty 2

## 2014-05-07 MED ORDER — ONDANSETRON HCL 4 MG/2ML IJ SOLN
INTRAMUSCULAR | Status: AC
  Filled 2014-05-07: qty 2

## 2014-05-07 MED ORDER — SODIUM CHLORIDE 0.9 % IJ SOLN
INTRAMUSCULAR | Status: AC
  Filled 2014-05-07: qty 10

## 2014-05-07 MED ORDER — ONDANSETRON HCL 4 MG/2ML IJ SOLN: 4.0000 mg | INTRAMUSCULAR | Status: DC | PRN

## 2014-05-07 MED ORDER — ATROPINE SULFATE 0.4 MG/ML IJ SOLN
INTRAMUSCULAR | Status: AC
  Filled 2014-05-07: qty 1

## 2014-05-07 MED ORDER — GABAPENTIN 100 MG PO CAPS
100.0000 mg | ORAL_CAPSULE | Freq: Three times a day (TID) | ORAL | Status: DC
  Administered 2014-05-08: 100 mg via ORAL
  Filled 2014-05-07 (×5): qty 1

## 2014-05-07 MED ORDER — MIDAZOLAM HCL 5 MG/5ML IJ SOLN
INTRAMUSCULAR | Status: DC | PRN
  Administered 2014-05-07 (×4): 1 mg via INTRAVENOUS

## 2014-05-07 MED ORDER — LIDOCAINE HCL 2 % EX GEL
CUTANEOUS | Status: AC
  Filled 2014-05-07: qty 10

## 2014-05-07 MED ORDER — OXYBUTYNIN CHLORIDE 5 MG PO TABS: 5.0000 mg | ORAL_TABLET | Freq: Three times a day (TID) | ORAL | Status: DC | PRN

## 2014-05-07 MED ORDER — BUPIVACAINE HCL 0.25 % IJ SOLN
INTRAMUSCULAR | Status: DC | PRN
  Administered 2014-05-07: 12 mL

## 2014-05-07 MED ORDER — BELLADONNA ALKALOIDS-OPIUM 16.2-60 MG RE SUPP
RECTAL | Status: AC
  Filled 2014-05-07: qty 1

## 2014-05-07 MED ORDER — METOPROLOL TARTRATE 50 MG PO TABS
100.0000 mg | ORAL_TABLET | Freq: Two times a day (BID) | ORAL | Status: DC
  Administered 2014-05-07 – 2014-05-08 (×2): 100 mg via ORAL
  Filled 2014-05-07 (×2): qty 2

## 2014-05-07 MED ORDER — CIPROFLOXACIN IN D5W 400 MG/200ML IV SOLN
INTRAVENOUS | Status: AC
  Filled 2014-05-07: qty 200

## 2014-05-07 MED ORDER — HYDROMORPHONE HCL 1 MG/ML IJ SOLN: 0.5000 mg | INTRAMUSCULAR | Status: DC | PRN

## 2014-05-07 MED ORDER — LOSARTAN POTASSIUM 50 MG PO TABS
100.0000 mg | ORAL_TABLET | Freq: Every day | ORAL | Status: DC
  Administered 2014-05-08: 100 mg via ORAL
  Filled 2014-05-07: qty 2

## 2014-05-07 MED ORDER — FENTANYL CITRATE 0.05 MG/ML IJ SOLN
INTRAMUSCULAR | Status: AC
  Filled 2014-05-07: qty 5

## 2014-05-07 MED ORDER — LIDOCAINE HCL (CARDIAC) 20 MG/ML IV SOLN
INTRAVENOUS | Status: AC
  Filled 2014-05-07: qty 5

## 2014-05-07 MED ORDER — ZOLPIDEM TARTRATE 5 MG PO TABS: 5.0000 mg | ORAL_TABLET | Freq: Every evening | ORAL | Status: DC | PRN

## 2014-05-07 MED ORDER — ACETAMINOPHEN 325 MG PO TABS: 650.0000 mg | ORAL_TABLET | ORAL | Status: DC | PRN

## 2014-05-07 MED ORDER — ACETAMINOPHEN 10 MG/ML IV SOLN
1000.0000 mg | Freq: Once | INTRAVENOUS | Status: AC
  Administered 2014-05-07: 1000 mg via INTRAVENOUS
  Filled 2014-05-07: qty 100

## 2014-05-07 MED ORDER — BUPIVACAINE HCL (PF) 0.25 % IJ SOLN
INTRAMUSCULAR | Status: AC
  Filled 2014-05-07: qty 30

## 2014-05-07 MED ORDER — BUPROPION HCL ER (XL) 150 MG PO TB24
150.0000 mg | ORAL_TABLET | Freq: Every morning | ORAL | Status: DC
  Administered 2014-05-08: 150 mg via ORAL
  Filled 2014-05-07: qty 1

## 2014-05-07 MED ORDER — DEXAMETHASONE SODIUM PHOSPHATE 10 MG/ML IJ SOLN
INTRAMUSCULAR | Status: DC | PRN
  Administered 2014-05-07: 10 mg via INTRAVENOUS

## 2014-05-07 MED ORDER — LACTATED RINGERS IV SOLN
INTRAVENOUS | Status: DC
  Administered 2014-05-07: 1000 mL via INTRAVENOUS
  Administered 2014-05-07: 11:00:00 via INTRAVENOUS

## 2014-05-07 MED ORDER — LIDOCAINE HCL (CARDIAC) 20 MG/ML IV SOLN
INTRAVENOUS | Status: DC | PRN
  Administered 2014-05-07: 75 mg via INTRAVENOUS

## 2014-05-07 MED ORDER — DOCUSATE SODIUM 100 MG PO CAPS
100.0000 mg | ORAL_CAPSULE | Freq: Two times a day (BID) | ORAL | Status: DC
  Administered 2014-05-07 (×2): 100 mg via ORAL
  Filled 2014-05-07 (×5): qty 1

## 2014-05-07 MED ORDER — KCL IN DEXTROSE-NACL 20-5-0.45 MEQ/L-%-% IV SOLN
INTRAVENOUS | Status: DC
  Administered 2014-05-07 – 2014-05-08 (×2): via INTRAVENOUS
  Filled 2014-05-07 (×4): qty 1000

## 2014-05-07 MED ORDER — CETYLPYRIDINIUM CHLORIDE 0.05 % MT LIQD
7.0000 mL | Freq: Two times a day (BID) | OROMUCOSAL | Status: DC
  Administered 2014-05-07 – 2014-05-08 (×3): 7 mL via OROMUCOSAL

## 2014-05-07 MED ORDER — EPHEDRINE SULFATE 50 MG/ML IJ SOLN
INTRAMUSCULAR | Status: AC
  Filled 2014-05-07: qty 1

## 2014-05-07 MED ORDER — EPHEDRINE SULFATE 50 MG/ML IJ SOLN
INTRAMUSCULAR | Status: DC | PRN
  Administered 2014-05-07 (×5): 2.5 mg via INTRAVENOUS

## 2014-05-07 MED ORDER — FENTANYL CITRATE 0.05 MG/ML IJ SOLN
INTRAMUSCULAR | Status: DC | PRN
  Administered 2014-05-07 (×3): 50 ug via INTRAVENOUS

## 2014-05-07 MED ORDER — ATORVASTATIN CALCIUM 20 MG PO TABS
20.0000 mg | ORAL_TABLET | Freq: Every day | ORAL | Status: DC
  Administered 2014-05-07 – 2014-05-08 (×2): 20 mg via ORAL
  Filled 2014-05-07 (×2): qty 1

## 2014-05-07 MED ORDER — BELLADONNA ALKALOIDS-OPIUM 16.2-60 MG RE SUPP
RECTAL | Status: DC | PRN
  Administered 2014-05-07: 1 via RECTAL

## 2014-05-07 MED ORDER — POTASSIUM CHLORIDE ER 10 MEQ PO TBCR
10.0000 meq | EXTENDED_RELEASE_TABLET | Freq: Every day | ORAL | Status: DC
  Administered 2014-05-07 – 2014-05-08 (×2): 10 meq via ORAL
  Filled 2014-05-07 (×2): qty 1

## 2014-05-07 MED ORDER — DIPHENHYDRAMINE HCL 50 MG/ML IJ SOLN
12.5000 mg | Freq: Four times a day (QID) | INTRAMUSCULAR | Status: DC | PRN
Start: 2014-05-07 — End: 2014-05-08

## 2014-05-07 MED ORDER — BISACODYL 10 MG RE SUPP: 10.0000 mg | Freq: Every day | RECTAL | Status: DC | PRN

## 2014-05-07 MED ORDER — PROPOFOL 10 MG/ML IV BOLUS
INTRAVENOUS | Status: AC
  Filled 2014-05-07: qty 20

## 2014-05-07 MED ORDER — PROPOFOL 10 MG/ML IV BOLUS
INTRAVENOUS | Status: DC | PRN
  Administered 2014-05-07: 150 mg via INTRAVENOUS
  Administered 2014-05-07: 50 mg via INTRAVENOUS

## 2014-05-07 MED ORDER — BUPROPION HCL ER (XL) 300 MG PO TB24
300.0000 mg | ORAL_TABLET | Freq: Every morning | ORAL | Status: DC
  Administered 2014-05-08: 300 mg via ORAL
  Filled 2014-05-07: qty 1

## 2014-05-07 MED ORDER — ONDANSETRON HCL 4 MG/2ML IJ SOLN
INTRAMUSCULAR | Status: DC | PRN
  Administered 2014-05-07 (×2): 2 mg via INTRAVENOUS

## 2014-05-07 MED ORDER — DIPHENHYDRAMINE HCL 12.5 MG/5ML PO ELIX: 12.5000 mg | ORAL_SOLUTION | Freq: Four times a day (QID) | ORAL | Status: DC | PRN

## 2014-05-07 MED ORDER — DEXAMETHASONE SODIUM PHOSPHATE 10 MG/ML IJ SOLN
INTRAMUSCULAR | Status: AC
  Filled 2014-05-07: qty 1

## 2014-05-07 MED ORDER — FELODIPINE ER 5 MG PO TB24
5.0000 mg | ORAL_TABLET | Freq: Every day | ORAL | Status: DC
  Administered 2014-05-07 – 2014-05-08 (×2): 5 mg via ORAL
  Filled 2014-05-07 (×2): qty 1

## 2014-05-07 MED ORDER — HYDROCODONE-ACETAMINOPHEN 5-325 MG PO TABS
1.0000 | ORAL_TABLET | ORAL | Status: DC | PRN
  Administered 2014-05-07: 2 via ORAL
  Administered 2014-05-08: 1 via ORAL
  Filled 2014-05-07: qty 2
  Filled 2014-05-07: qty 1

## 2014-05-07 MED ORDER — CIPROFLOXACIN IN D5W 400 MG/200ML IV SOLN
400.0000 mg | INTRAVENOUS | Status: AC
  Administered 2014-05-07: 400 mg via INTRAVENOUS

## 2014-05-07 SURGICAL SUPPLY — 28 items
BAG URINE DRAINAGE (UROLOGICAL SUPPLIES) ×1 IMPLANT
BAG URO CATCHER STRL LF (DRAPE) ×3 IMPLANT
BLADE SURG 15 STRL LF DISP TIS (BLADE) IMPLANT
BLADE SURG 15 STRL SS (BLADE)
CATH FOLEY 3WAY 30CC 22FR (CATHETERS) ×1 IMPLANT
CATH URET 5FR 28IN OPEN ENDED (CATHETERS) IMPLANT
CONT SPEC 4OZ CLIKSEAL STRL BL (MISCELLANEOUS) ×1 IMPLANT
DRAPE CAMERA CLOSED 9X96 (DRAPES) ×2 IMPLANT
DRAPE LAPAROTOMY T 98X78 PEDS (DRAPES) ×1 IMPLANT
ELECT REM PT RETURN 9FT ADLT (ELECTROSURGICAL) ×3
ELECTRODE REM PT RTRN 9FT ADLT (ELECTROSURGICAL) ×2 IMPLANT
GLOVE SURG SS PI 8.0 STRL IVOR (GLOVE) ×1 IMPLANT
GOWN BRE IMP SLV SIRUS LXLNG (GOWN DISPOSABLE) ×1 IMPLANT
GOWN STRL REUS W/TWL XL LVL3 (GOWN DISPOSABLE) ×3 IMPLANT
HOLDER FOLEY CATH W/STRAP (MISCELLANEOUS) ×1 IMPLANT
IV NS IRRIG 3000ML ARTHROMATIC (IV SOLUTION) ×10 IMPLANT
KIT ASPIRATION TUBING (SET/KITS/TRAYS/PACK) ×2 IMPLANT
LEGGING LITHOTOMY PAIR STRL (DRAPES) ×1 IMPLANT
LIQUID BAND (GAUZE/BANDAGES/DRESSINGS) ×1 IMPLANT
MANIFOLD NEPTUNE II (INSTRUMENTS) ×3 IMPLANT
MARKER SKIN DUAL TIP RULER LAB (MISCELLANEOUS) ×1 IMPLANT
PACK CYSTO (CUSTOM PROCEDURE TRAY) ×3 IMPLANT
SUT CHROMIC 3 0 SH 27 (SUTURE) ×2 IMPLANT
SUT ETHILON 3 0 PS 1 (SUTURE) IMPLANT
SYR 30ML LL (SYRINGE) IMPLANT
SYRINGE IRR TOOMEY STRL 70CC (SYRINGE) ×1 IMPLANT
TUBING CONNECTING 10 (TUBING) ×3 IMPLANT
YANKAUER SUCT BULB TIP 10FT TU (MISCELLANEOUS) ×1 IMPLANT

## 2014-05-07 NOTE — Interval H&P Note (Signed)
History and Physical Interval Note:  No changes in history.   Site confirmed as left spermatocele.   05/07/2014 9:07 AM  George [name].  has presented today for surgery, with the diagnosis of BPH with Bladder Outlet Obstruction and Spermatocele  The various methods of treatment have been discussed with the patient and family. After consideration of risks, benefits and other options for treatment, the patient has consented to  Procedure(s): TRANSURETHRAL RESECTION OF THE PROSTATE WITH GYRUS INSTRUMENTS (N/A) LEFT SPERMATOCELECTOMY (Left) as a surgical intervention .  The patient's history has been reviewed, patient examined, no change in status, stable for surgery.  I have reviewed the patient's chart and labs.  Questions were answered to the patient's satisfaction.     George [name]

## 2014-05-07 NOTE — Progress Notes (Signed)
Cont with plan of care. No changes in initial am assessment at this time

## 2014-05-07 NOTE — Discharge Instructions (Signed)
Hydrocele, Adult Fluid can collect around the testicles. This fluid forms in a sac. This condition is called a hydrocele. The collected fluid causes swelling of the scrotum. Usually, it affects just one testicle. Most of the time, the condition does not cause pain. Sometimes, the hydrocele goes away on its own. Other times, surgery is needed to get rid of the fluid. CAUSES A hydrocele does not develop often. Different things can cause a hydrocele in a man, including:  Injury to the scrotum.  Infection.  X-ray of the area around the scrotum.  A tumor or cancer of the testicle.  Twisting of a testicle.  Decreased blood flow to the scrotum. SYMPTOMS   Swelling without pain. The hydrocele feels like a water-filled balloon.  Swelling with pain. This can occur if the hydrocele was caused by infection or twisting.  Mild discomfort in the scrotum.  The hydrocele may feel heavy.  Swelling that gets smaller when you lie down. DIAGNOSIS  Your caregiver will do a physical exam to decide if you have a hydrocele. This may include:  Asking questions about your overall health, today and in the past. Your caregiver may ask about any injuries, X-rays, or infections.  Pushing on your abdomen or asking you to change positions to see if the size of the hydrocele changes.  Shining a light through the scrotum (transillumination) to see if the fluid inside the scrotum is clear.  Blood tests and urine tests to check for infection.  Imaging studies that take pictures of the scrotum and testicles. TREATMENT  Treatment depends in part on what caused the condition. Options include:  Watchful waiting. Your caregiver checks the hydrocele every so often.  Different surgeries to drain the fluid.  A needle may be put into the scrotum to drain fluid (needle aspiration). Fluid often returns after this type of treatment.  A cut (incision) may be made in the scrotum to remove the fluid sac  (hydrocelectomy).  An incision may be made in the groin to repair a hydrocele that has contact with abdominal fluids (communicating hydrocele).  Medicines to treat an infection (antibiotics). HOME CARE INSTRUCTIONS  What you need to do at home may depend on the cause of the hydrocele and type of treatment. In general:  Take all medicine as directed by your caregiver. Follow the directions carefully.  Ask your caregiver if there is anything you should not do while you recover (activities, lifting, work, sex).  If you had surgery to repair a communicating hydrocele, recovery time may vary. Ask you caregiver about your recovery time.  Avoid heavy lifting for 4 to 6 weeks.  If you had an incision on the scrotum or groin, wash it for 2 to 3 days after surgery. Do this as long as the skin is closed and there are no gaps in the wound. Wash gently, and avoid rubbing the incision.  Keep all follow-up appointments. SEEK MEDICAL CARE IF:   Your scrotum seems to be getting larger.  The area becomes more and more uncomfortable. SEEK IMMEDIATE MEDICAL CARE IF:  You have a fever. Document Released: 09/16/2009 Document Revised: 01/17/2013 Document Reviewed: 09/16/2009 [facility] Patient Information 2015 [place], [place]. This information is not intended to replace advice given to you by your health care provider. Make sure you discuss any questions you have with your health care provider. Transurethral Resection of the Prostate Care After Refer to this sheet in the next few weeks. These instructions provide you with information on caring for yourself  after your procedure. Your caregiver also may give you specific instructions. Your treatment has been planned according to current medical practices, but complications sometimes occur. Call your caregiver if you have any problems or questions after your procedure. HOME CARE INSTRUCTIONS  Recovery can take 4-6 weeks. Avoid alcohol, caffeinated drinks, and  spicy foods for 2 weeks after your procedure. Drink enough fluids to keep your urine clear or pale yellow. Urinate as soon as you feel the urge to do so. Do not try to hold your urine for long periods of time. During recovery you may experience pain caused by bladder spasms, which result in a very intense urge to urinate. Take all medicines as directed by your caregiver, including medicines for pain. Try to limit the amount of pain medicines you take because it can cause constipation. If you do become constipated, do not strain to move your bowels. Straining can increase bleeding. Constipation can be minimized by increasing the amount fluids and fiber in your diet. Your caregiver also may prescribe a stool softener. Do not lift heavy objects (more than 5 lb [2.25 kg]) or perform exercises that cause you to strain for at least 1 month after your procedure. When sitting, you may want to sit in a soft chair or use a cushion. For the first 10 days after your procedure, avoid the following activities:  Running.  Strenuous work.  Long walks.  Riding in a car for extended periods.  Sex. SEEK MEDICAL CARE IF:  You have difficulty urinating.  You have blood in your urine that does not go away after you rest or increase your fluid intake.  You have swelling in your penis or scrotum. SEEK IMMEDIATE MEDICAL CARE IF:   You are suddenly unable to urinate.  You notice blood clots in your urine.  You have chills.  You have a fever.  You have pain in your back or lower abdomen.  You have pain or swelling in your legs. MAKE SURE YOU:   Understand these instructions.  Will watch your condition.  Will get help right away if you are not doing well or get worse. Document Released: 03/29/2005 Document Revised: 12/22/2011 Document Reviewed: 05/07/2011 [facility] Patient Information 2015 [place], [place]. This information is not intended to replace advice given to you by your health care provider. Make  sure you discuss any questions you have with your health care provider.

## 2014-05-07 NOTE — Anesthesia Preprocedure Evaluation (Addendum)
Anesthesia Evaluation  Patient identified by MRN, date of birth, ID band Patient awake    Reviewed: Allergy & Precautions, NPO status , Patient's Chart, lab work & pertinent test results  History of Anesthesia Complications Negative for: history of anesthetic complications  Airway Mallampati: II  TM Distance: >3 FB Neck ROM: Full    Dental  (+) Edentulous Upper, Edentulous Lower   Pulmonary former smoker,  breath sounds clear to auscultation        Cardiovascular hypertension, Pt. on medications and Pt. on home beta blockers - angina- Past MI and - CHF - dysrhythmias Rhythm:Regular     Neuro/Psych PSYCHIATRIC DISORDERS Anxiety Depression negative neurological ROS     GI/Hepatic negative GI ROS, Neg liver ROS,   Endo/Other  negative endocrine ROS  Renal/GU negative Renal ROS     Musculoskeletal   Abdominal   Peds  Hematology negative hematology ROS (+)   Anesthesia Other Findings   Reproductive/Obstetrics                           Anesthesia Physical Anesthesia Plan  ASA: II  Anesthesia Plan: General   Post-op Pain Management:    Induction: Intravenous  Airway Management Planned: LMA and Oral ETT  Additional Equipment: None  Intra-op Plan:   Post-operative Plan:   Informed Consent: I have reviewed the patients History and Physical, chart, labs and discussed the procedure including the risks, benefits and alternatives for the proposed anesthesia with the patient or authorized representative who has indicated his/her understanding and acceptance.   Dental advisory given  Plan Discussed with: CRNA, Surgeon and Anesthesiologist  Anesthesia Plan Comments:        Anesthesia Quick Evaluation

## 2014-05-07 NOTE — Anesthesia Postprocedure Evaluation (Signed)
  Anesthesia Post-op Note  Patient: George [name].  Procedure(s) Performed: Procedure(s): TRANSURETHRAL RESECTION OF THE PROSTATE WITH GYRUS INSTRUMENTS (N/A) LEFT SPERMATOCELECTOMY (Left)  Patient Location: PACU  Anesthesia Type:General  Level of Consciousness: awake  Airway and Oxygen Therapy: Patient Spontanous Breathing and Patient connected to nasal cannula oxygen  Post-op Pain: mild  Post-op Assessment: Post-op Vital signs reviewed, Patient's Cardiovascular Status Stable, Respiratory Function Stable, Patent Airway, No signs of Nausea or vomiting and Pain level controlled  Post-op Vital Signs: Reviewed and stable  Last Vitals:  Filed Vitals:   05/07/14 1801  BP: 167/94  Pulse: 77  Temp: 36.6 C  Resp: 18    Complications: No apparent anesthesia complications

## 2014-05-07 NOTE — Brief Op Note (Signed)
05/07/2014  10:58 AM  PATIENT:  George [name].  69 y.o. male  PRE-OPERATIVE DIAGNOSIS:  Prostate cancer and BPH with Bladder Outlet Obstruction and Spermatocele  POST-OPERATIVE DIAGNOSIS:  Prostate cancer and BPH with Bladder Outlet Obstruction and Spermatocele  PROCEDURE:  Procedure(s): TRANSURETHRAL RESECTION OF THE PROSTATE WITH GYRUS INSTRUMENTS (N/A) LEFT SPERMATOCELECTOMY (Left)  SURGEON:  Surgeon(s) and Role:    * Malka So, MD - Primary  PHYSICIAN ASSISTANT:   ASSISTANTS: none   ANESTHESIA:   general  EBL:     BLOOD ADMINISTERED:none  DRAINS: Urinary Catheter (Foley)   LOCAL MEDICATIONS USED:  NONE  SPECIMEN:  Source of Specimen:  prostate chip  DISPOSITION OF SPECIMEN:  PATHOLOGY  COUNTS:  YES  TOURNIQUET:  * No tourniquets in log *  DICTATION: .Other Dictation: Dictation Number [id number]  PLAN OF CARE: Admit for overnight observation  PATIENT DISPOSITION:  PACU - hemodynamically stable.   Delay start of Pharmacological VTE agent (>24hrs) due to surgical blood loss or risk of bleeding: yes

## 2014-05-07 NOTE — Transfer of Care (Signed)
Immediate Anesthesia Transfer of Care Note  Patient: George [name].  Procedure(s) Performed: Procedure(s): TRANSURETHRAL RESECTION OF THE PROSTATE WITH GYRUS INSTRUMENTS (N/A) LEFT SPERMATOCELECTOMY (Left)  Patient Location: PACU  Anesthesia Type:General  Level of Consciousness: awake, oriented, patient cooperative and responds to stimulation  Airway & Oxygen Therapy: Patient Spontanous Breathing and Patient connected to face mask oxygen  Post-op Assessment: Report given to PACU RN, Post -op Vital signs reviewed and stable and Patient moving all extremities  Post vital signs: Reviewed and stable  Complications: No apparent anesthesia complications

## 2014-05-07 NOTE — Op Note (Signed)
NAMEGOVERNOR, MATOS               ACCOUNT NO.:  [id number]  MEDICAL RECORD NO.:  [id number]  LOCATION:  9741                         FACILITY:  [facility]  PHYSICIAN:  Marshall Cork. Jeffie Pollock, M.D.    DATE OF BIRTH:  [birth year]  DATE OF PROCEDURE:  05/07/2014 DATE OF DISCHARGE:                              OPERATIVE REPORT   PROCEDURE: 1. Left spermatocelectomy. 2. Transurethral resection of the prostate.  PREOPERATIVE DIAGNOSIS: 1. Prostate cancer, benign prostatic hyperplasia, bladder outlet     obstruction. 2. Left spermatocelectomy.  POSTOPERATIVE DIAGNOSIS: 1. Prostate cancer, benign prostatic hyperplasia, bladder outlet     obstruction. 2. Left spermatocelectomy.  SURGEON:  Marshall Cork. Jeffie Pollock, M.D.  ANESTHESIA:  General.  SPECIMEN:  Prostate chips.  DRAINS:  A 22-French 3-way Foley catheter.  BLOOD LOSS:  Minimal.  COMPLICATIONS:  None.  INDICATIONS:  Mr. Flemister is a 69 year old African American male, who has a large symptomatic left spermatocele.  He was also found to have a Gleason 6 prostate cancer.  It was felt to require treatment.  He was not interested in radical surgery and wanted radiation therapy, but he had significant outlet obstruction from BPH with trilobar hyperplasia and a large middle lobe and it was felt that a channel TUR was indicated to prepare him for radiation therapy.  FINDINGS AND PROCEDURE:  He was taken to the operating room where general anesthetic was induced.  He was placed in the supine position. His scrotum was clipped.  He was prepped with Betadine solution and draped in usual sterile fashion.  Left oblique scrotal incision was made along the skin line with a knife, this was carried through the dartos with the Bovie.  The testicle within the tunica vaginalis was delivered from the wound.  Cord block was performed with approximately 5 mL of 0.25% Marcaine.  Eventually, the skin was infiltrated with an additional 3 mL.  Once the testicle  was delivered, the tunic vaginalis was opened and dissected away from the spermatocele that arose from the head of the epididymis.  This large 6 cm spermatocele was dissected out, and the cyst was removed from the testicle.  The base of the cyst was oversewn with 3-0 chromic sutures to prevent recurrence.  At this point, hemostasis was achieved.  Correction was assured, and the testicle was returned to the right hemiscrotum.  The dartos was closed using a running 3-0 chromic, and the skin was closed with interrupted vertical mattress 3-0 chromics.  The wound was sealed with Dermabond.  The patient was then repositioned in the lithotomy position and re- prepped with Betadine solution and draped in usual sterile fashion. Cystoscopy was performed using a 21-French Wolf scope with 30-degree lens.  Inspection revealed a normal urethra with the exception of a very mild stricture in the proximal bulb.  The external sphincter was intact. The prostatic urethra was approximately 3 cm in length with trilobar hyperplasia and a large obstructing middle lobe.  Examination of the bladder revealed moderate trabeculation with a couple of cellules.  The ureteral orifices were unremarkable and well away from the bladder neck. No tumors or stones were seen.  Urethra was then calibrated to 30-French with Lucianne Lei  Buren sounds and a Wolf resectoscope was then placed, this was fitted with a 30-degree lens with handle and bipolar loop.  Saline was used for the irrigant.  The prostate was then resected beginning at the middle lobe which was resected down to the bladder neck fibers.  The floor was then resected out to alongside the verumontanum.  The right lobe was resected partially from bladder neck to apex followed by the left lobe.  No attempt was made to get down to the capsular fibers.  This the plan was to perform a channel TUR in preparation for radiation therapy.  Once resection was performed, the  bladder was evacuated free of chips and final hemostasis was achieved.  Final inspection revealed no retained chips and no active bleeding.  The ureteral orifices and the external sphincter were intact.  The scope was removed.  Pressure on the bladder produced good stream.  A 22-French 3-way Foley catheter was placed to aid of a catheter guide.  The balloon was filled with 30 mL of sterile fluid.  The catheter was irrigated with clear return and placed on continuous irrigation and straight drainage.  A B and O suppository was placed.  The patient was taken down from lithotomy position.  His anesthetic was reversed.  He was moved to recovery room in stable condition.  There were no complications.     Marshall Cork. Jeffie Pollock, M.D.     JJW/MEDQ  D:  05/07/2014  T:  05/07/2014  Job:  [id number]

## 2014-05-08 ENCOUNTER — Encounter (HOSPITAL_COMMUNITY): Payer: Self-pay | Admitting: Urology

## 2014-05-08 DIAGNOSIS — N138 Other obstructive and reflux uropathy: Secondary | ICD-10-CM | POA: Diagnosis not present

## 2014-05-08 DIAGNOSIS — N4340 Spermatocele of epididymis, unspecified: Secondary | ICD-10-CM | POA: Diagnosis not present

## 2014-05-08 DIAGNOSIS — C61 Malignant neoplasm of prostate: Secondary | ICD-10-CM | POA: Diagnosis not present

## 2014-05-08 DIAGNOSIS — N401 Enlarged prostate with lower urinary tract symptoms: Secondary | ICD-10-CM | POA: Diagnosis not present

## 2014-05-08 DIAGNOSIS — R3912 Poor urinary stream: Secondary | ICD-10-CM | POA: Diagnosis not present

## 2014-05-08 DIAGNOSIS — R3914 Feeling of incomplete bladder emptying: Secondary | ICD-10-CM | POA: Diagnosis not present

## 2014-05-08 MED ORDER — CIPROFLOXACIN HCL 500 MG PO TABS: 500.0000 mg | ORAL_TABLET | Freq: Two times a day (BID) | ORAL | Status: DC

## 2014-05-08 MED ORDER — HYDROCODONE-ACETAMINOPHEN 5-325 MG PO TABS: 1.0000 | ORAL_TABLET | ORAL | Status: DC | PRN

## 2014-05-08 NOTE — Discharge Summary (Signed)
Physician Discharge Summary  Patient ID: George [name]. MRN: [id number] DOB/AGE: [birth year] 69 y.o.  Admit date: 05/07/2014 Discharge date: 05/08/2014  Admission Diagnoses:  Benign localized hyperplasia of prostate with urinary obstruction  Discharge Diagnoses:  Principal Problem:   Benign localized hyperplasia of prostate with urinary obstruction Active Problems:   Malignant neoplasm of prostate   Spermatocele   Past Medical History  Diagnosis Date  . Elevated prostate specific antigen (PSA)   . Hematospermia   . Incomplete bladder emptying   . Nodular prostate with lower urinary tract symptoms   . Prostatitis   . Spermatocele   . Weak urinary stream   . Hx antineoplastic chemotherapy 2010  . Anxiety   . Depression   . Hypertension   . Malignant neoplasm of prostate   . Colon cancer   . Idiopathic peripheral neuropathy     TOES OF BOTH FEET DUE TO CHEMO    Surgeries: Procedure(s): TRANSURETHRAL RESECTION OF THE PROSTATE WITH GYRUS INSTRUMENTS LEFT SPERMATOCELECTOMY on 05/07/2014   Consultants (if any):    Discharged Condition: Improved  Hospital Course: George [name]. is an 69 y.o. male who was admitted 05/07/2014 with a diagnosis of Gleason 6 prostate cancer and  Benign localized hyperplasia of prostate with urinary obstruction along with a symptomatic left spermatocele and went to the operating room on 05/07/2014 and underwent the above named procedures.  He did well postop and the urine remains clear off of irrigation.  His incision is intact with minimal scrotal swelling.  His BP is up this morning despite taking his antihypertensives last night.  He has some pain which could be the cause.  I will medicate the pain and will have the foley removed.  He will be discharged home when voiding.    He was given perioperative antibiotics:  Anti-infectives    Start     Dose/Rate Route Frequency Ordered Stop   05/08/14 0000  ciprofloxacin (CIPRO) 500 MG tablet     500  mg Oral 2 times daily 05/08/14 0627     05/07/14 2000  ciprofloxacin (CIPRO) tablet 500 mg     500 mg Oral 2 times daily 05/07/14 1226     05/07/14 0656  ciprofloxacin (CIPRO) IVPB 400 mg     400 mg200 mL/hr over 60 Minutes Intravenous 60 min pre-op 05/07/14 0656 05/07/14 0900    .  He was given sequential compression devices,for DVT prophylaxis.  He benefited maximally from the hospital stay and there were no complications.    Recent vital signs:  Filed Vitals:   05/08/14 0533  BP: 184/99  Pulse: 52  Temp: 97.9 F (36.6 C)  Resp: 20    Recent laboratory studies:  Lab Results  Component Value Date   HGB 13.9 05/02/2014   HGB 13.2 09/18/2009   HGB 12.2* 09/10/2009   Lab Results  Component Value Date   WBC 7.6 05/02/2014   PLT 336 05/02/2014   Lab Results  Component Value Date   INR 1.0 04/01/2008   Lab Results  Component Value Date   NA 140 09/18/2009   K 4.8 09/18/2009   CL 107 09/18/2009   CO2 27 09/18/2009   BUN 6 09/18/2009   CREATININE 1.16 09/18/2009   GLUCOSE 112* 09/18/2009    Discharge Medications:     Medication List    TAKE these medications        atorvastatin 20 MG tablet  Commonly known as:  LIPITOR  Take 20 mg by mouth daily.  buPROPion 300 MG 24 hr tablet  Commonly known as:  WELLBUTRIN XL  Take 300 mg by mouth every morning.     buPROPion 150 MG 24 hr tablet  Commonly known as:  WELLBUTRIN XL  Take 150 mg by mouth every morning.     ciprofloxacin 500 MG tablet  Commonly known as:  CIPRO  Take 1 tablet (500 mg total) by mouth 2 (two) times daily.     felodipine 5 MG 24 hr tablet  Commonly known as:  PLENDIL  Take 5 mg by mouth daily.     gabapentin 100 MG capsule  Commonly known as:  NEURONTIN  Take 100 mg by mouth 3 (three) times daily.     HYDROcodone-acetaminophen 5-325 MG per tablet  Commonly known as:  NORCO/VICODIN  Take 1-2 tablets by mouth every 4 (four) hours as needed for moderate pain.     losartan 100 MG  tablet  Commonly known as:  COZAAR  Take 100 mg by mouth daily.     metoprolol 100 MG tablet  Commonly known as:  LOPRESSOR  Take 100 mg by mouth 2 (two) times daily.     potassium chloride 10 MEQ tablet  Commonly known as:  K-DUR  Take 10 mEq by mouth daily.        Diagnostic Studies: Dg Chest 2 View  05/02/2014   CLINICAL DATA:  Preop for TURP  EXAM: CHEST  2 VIEW  COMPARISON:  09/18/2009  FINDINGS: Cardiomediastinal silhouette is stable. No acute infiltrate or pleural effusion. No pulmonary edema. Minimal degenerative changes thoracic spine.  IMPRESSION: No active cardiopulmonary disease.   Electronically Signed   By: Lahoma Crocker M.D.   On: 05/02/2014 12:24    Disposition: Home        Follow-up Information    Follow up with [facility], NP On 05/16/2014.   Specialty:  Urology   Why:  [address] information:   [place] [id number] [phone]        Signed: Malka So 05/08/2014, 6:27 AM

## 2014-05-16 DIAGNOSIS — N401 Enlarged prostate with lower urinary tract symptoms: Secondary | ICD-10-CM | POA: Diagnosis not present

## 2014-06-19 DIAGNOSIS — N390 Urinary tract infection, site not specified: Secondary | ICD-10-CM | POA: Diagnosis not present

## 2014-09-26 DIAGNOSIS — R361 Hematospermia: Secondary | ICD-10-CM | POA: Diagnosis not present

## 2014-09-26 DIAGNOSIS — N4340 Spermatocele of epididymis, unspecified: Secondary | ICD-10-CM | POA: Diagnosis not present

## 2014-09-26 DIAGNOSIS — N403 Nodular prostate with lower urinary tract symptoms: Secondary | ICD-10-CM | POA: Diagnosis not present

## 2014-09-26 DIAGNOSIS — C61 Malignant neoplasm of prostate: Secondary | ICD-10-CM | POA: Diagnosis not present

## 2014-09-27 ENCOUNTER — Telehealth: Payer: Self-pay | Admitting: *Deleted

## 2014-09-27 NOTE — Telephone Encounter (Signed)
CALLED PATIENT TO INFORM OF SIM FOR 10-04-14 @ 10 AM, NO ANSWER WILL CALL LATER.

## 2014-09-30 DIAGNOSIS — C61 Malignant neoplasm of prostate: Secondary | ICD-10-CM | POA: Diagnosis not present

## 2014-09-30 DIAGNOSIS — F39 Unspecified mood [affective] disorder: Secondary | ICD-10-CM | POA: Diagnosis not present

## 2014-09-30 DIAGNOSIS — I10 Essential (primary) hypertension: Secondary | ICD-10-CM | POA: Diagnosis not present

## 2014-09-30 DIAGNOSIS — E785 Hyperlipidemia, unspecified: Secondary | ICD-10-CM | POA: Diagnosis not present

## 2014-09-30 NOTE — Addendum Note (Signed)
Encounter addended by: Heywood Footman, RN on: 09/30/2014  1:20 PM<BR>     Documentation filed: Inpatient Document Flowsheet

## 2014-10-01 DIAGNOSIS — C61 Malignant neoplasm of prostate: Secondary | ICD-10-CM | POA: Diagnosis not present

## 2014-10-03 ENCOUNTER — Telehealth: Payer: Self-pay | Admitting: *Deleted

## 2014-10-03 NOTE — Telephone Encounter (Signed)
CALLED PATIENT TO REMIND OF APPT. FOR 10-04-14 @ 10 AM, LVM FOR A RETURN CALL

## 2014-10-04 ENCOUNTER — Ambulatory Visit
Admission: RE | Admit: 2014-10-04 | Discharge: 2014-10-04 | Disposition: A | Discharge status: Home / Self Care | Payer: Medicare Other | Source: Ambulatory Visit | Attending: Radiation Oncology | Admitting: Radiation Oncology

## 2014-10-04 DIAGNOSIS — C61 Malignant neoplasm of prostate: Secondary | ICD-10-CM

## 2014-10-04 DIAGNOSIS — Z510 Encounter for antineoplastic radiation therapy: Secondary | ICD-10-CM | POA: Diagnosis not present

## 2014-10-04 NOTE — Progress Notes (Signed)
  Radiation Oncology         [phone]) [phone] ________________________________  Name: George [name]. MRN: [id number]  Date: 10/04/2014  DOB: May 22, 1945  SIMULATION AND TREATMENT PLANNING NOTE    ICD-9-CM ICD-10-CM   1. Malignant neoplasm of prostate 185 C61     DIAGNOSIS: 69 y.o. gentleman with stage T2a adenocarcinoma of the prostate with a Gleason's score of 3+3 and a PSA of 4.47  NARRATIVE:  The patient was brought to the [facility].  Identity was confirmed.  All relevant records and images related to the planned course of therapy were reviewed.  The patient freely provided informed written consent to proceed with treatment after reviewing the details related to the planned course of therapy. The consent form was witnessed and verified by the simulation staff.  Then, the patient was set-up in a stable reproducible supine position for radiation therapy.  A vacuum lock pillow device was custom fabricated to position his legs in a reproducible immobilized position.  Then, I performed a urethrogram under sterile conditions to identify the prostatic apex.  CT images were obtained.  Surface markings were placed.  The CT images were loaded into the planning software.  Then the prostate target and avoidance structures including the rectum, bladder, bowel and hips were contoured.  Treatment planning then occurred.  The radiation prescription was entered and confirmed.  A total of 1 complex treatment devices were fabricated. I have requested : Intensity Modulated Radiotherapy (IMRT) is medically necessary for this case for the following reason:  Rectal sparing.  PLAN:  The patient will receive 76 Gy in 40 fractions.  This document serves as a record of services personally performed by Tyler Pita, MD. It was created on his behalf by Darcus Austin, a trained medical scribe. The creation of this record is based on the scribe's personal observations and the provider's statements to them.  This document has been checked and approved by the attending provider.     ________________________________  Sheral Apley. Tammi Klippel, M.D.

## 2014-10-07 DIAGNOSIS — Z510 Encounter for antineoplastic radiation therapy: Secondary | ICD-10-CM | POA: Diagnosis not present

## 2014-10-07 DIAGNOSIS — C61 Malignant neoplasm of prostate: Secondary | ICD-10-CM | POA: Diagnosis not present

## 2014-10-08 DIAGNOSIS — Z510 Encounter for antineoplastic radiation therapy: Secondary | ICD-10-CM | POA: Diagnosis not present

## 2014-10-08 DIAGNOSIS — C61 Malignant neoplasm of prostate: Secondary | ICD-10-CM | POA: Diagnosis not present

## 2014-10-16 ENCOUNTER — Ambulatory Visit
Admission: RE | Admit: 2014-10-16 | Discharge: 2014-10-16 | Disposition: A | Discharge status: Home / Self Care | Payer: Medicare Other | Source: Ambulatory Visit | Attending: Radiation Oncology | Admitting: Radiation Oncology

## 2014-10-16 DIAGNOSIS — C61 Malignant neoplasm of prostate: Secondary | ICD-10-CM | POA: Diagnosis not present

## 2014-10-16 DIAGNOSIS — Z510 Encounter for antineoplastic radiation therapy: Secondary | ICD-10-CM | POA: Diagnosis not present

## 2014-10-17 ENCOUNTER — Ambulatory Visit
Admission: RE | Admit: 2014-10-17 | Discharge: 2014-10-17 | Disposition: A | Discharge status: Home / Self Care | Payer: Medicare Other | Source: Ambulatory Visit | Attending: Radiation Oncology | Admitting: Radiation Oncology

## 2014-10-17 DIAGNOSIS — Z510 Encounter for antineoplastic radiation therapy: Secondary | ICD-10-CM | POA: Diagnosis not present

## 2014-10-17 DIAGNOSIS — C61 Malignant neoplasm of prostate: Secondary | ICD-10-CM | POA: Diagnosis not present

## 2014-10-18 ENCOUNTER — Ambulatory Visit
Admission: RE | Admit: 2014-10-18 | Discharge: 2014-10-18 | Disposition: A | Discharge status: Home / Self Care | Payer: Medicare Other | Source: Ambulatory Visit | Attending: Radiation Oncology | Admitting: Radiation Oncology

## 2014-10-18 ENCOUNTER — Encounter: Payer: Self-pay | Admitting: Radiation Oncology

## 2014-10-18 VITALS — BP 143/98 | HR 66 | Resp 16 | Wt 203.4 lb

## 2014-10-18 DIAGNOSIS — Z510 Encounter for antineoplastic radiation therapy: Secondary | ICD-10-CM | POA: Diagnosis not present

## 2014-10-18 DIAGNOSIS — C61 Malignant neoplasm of prostate: Secondary | ICD-10-CM

## 2014-10-18 NOTE — Progress Notes (Signed)
Oriented patient to staff and routine of the clinic. Provided patient with RADIATION THERAPY AND YOU handbook then, reviewed pertinent information. Educated patient reference potential side effects and management such as fatigue, diarrhea, and urinary/bladder changes. Patient verbalized understanding of all reviewed. Patient encouraged to contact this RN with future needs. Provided my business card.

## 2014-10-18 NOTE — Progress Notes (Signed)
Weight and vitals stable. Denies pain. Denies nocturia. Describes a strong steady urine stream. Denies dysuria or hematuria. Reports blood in semen continues. Denies diarrhea. Denies leakage or incontinence.   BP 143/98 mmHg  Pulse 66  Resp 16  Wt 203 lb 6.4 oz (92.262 kg) Wt Readings from Last 3 Encounters:  10/18/14 203 lb 6.4 oz (92.262 kg)  05/07/14 198 lb (89.812 kg)  05/02/14 198 lb 12.8 oz (90.175 kg)

## 2014-10-18 NOTE — Progress Notes (Signed)
  Radiation Oncology         [phone]   Name: George [name]. MRN: [id number]   Date: 10/18/2014  DOB: 20-Feb-1946   Weekly Radiation Therapy Management    ICD-9-CM ICD-10-CM   1. Malignant neoplasm of prostate 185 C61     Current Dose: 5.7 Gy  Planned Dose:  76 Gy  Narrative The patient presents for routine under treatment assessment. Weight and vitals stable. Denies pain. Denies nocturia, dysuria, hematuria, diarrhea, leakage, or incontinence. Describes a strong steady urine stream. Reports blood in semen continues. The pt reports a shortness of breath that he noticed today. He reports having nausea for the first time today. Set-up films were reviewed. The chart was checked.  Physical Findings  weight is 203 lb 6.4 oz (92.262 kg). His blood pressure is 143/98 and his pulse is 66. His respiration is 16.  Weight essentially stable.  No significant changes.  Impression The patient is tolerating radiation.  Plan Continue treatment as planned. I advised the pt to drink more water before he comes to his treatments.    This document serves as a record of services personally performed by Tyler Pita, MD. It was created on his behalf by Darcus Austin, a trained medical scribe. The creation of this record is based on the scribe's personal observations and the provider's statements to them. This document has been checked and approved by the attending provider.      [name], M.D.

## 2014-10-21 ENCOUNTER — Ambulatory Visit
Admission: RE | Admit: 2014-10-21 | Discharge: 2014-10-21 | Disposition: A | Discharge status: Home / Self Care | Payer: Medicare Other | Source: Ambulatory Visit | Attending: Radiation Oncology | Admitting: Radiation Oncology

## 2014-10-21 DIAGNOSIS — Z510 Encounter for antineoplastic radiation therapy: Secondary | ICD-10-CM | POA: Diagnosis not present

## 2014-10-21 DIAGNOSIS — C61 Malignant neoplasm of prostate: Secondary | ICD-10-CM | POA: Diagnosis not present

## 2014-10-22 ENCOUNTER — Ambulatory Visit
Admission: RE | Admit: 2014-10-22 | Discharge: 2014-10-22 | Disposition: A | Discharge status: Home / Self Care | Payer: Medicare Other | Source: Ambulatory Visit | Attending: Radiation Oncology | Admitting: Radiation Oncology

## 2014-10-22 DIAGNOSIS — C61 Malignant neoplasm of prostate: Secondary | ICD-10-CM | POA: Diagnosis not present

## 2014-10-22 DIAGNOSIS — Z510 Encounter for antineoplastic radiation therapy: Secondary | ICD-10-CM | POA: Diagnosis not present

## 2014-10-23 ENCOUNTER — Ambulatory Visit
Admission: RE | Admit: 2014-10-23 | Discharge: 2014-10-23 | Disposition: A | Discharge status: Home / Self Care | Payer: Medicare Other | Source: Ambulatory Visit | Attending: Radiation Oncology | Admitting: Radiation Oncology

## 2014-10-23 DIAGNOSIS — C61 Malignant neoplasm of prostate: Secondary | ICD-10-CM | POA: Diagnosis not present

## 2014-10-23 DIAGNOSIS — Z510 Encounter for antineoplastic radiation therapy: Secondary | ICD-10-CM | POA: Diagnosis not present

## 2014-10-24 ENCOUNTER — Ambulatory Visit
Admission: RE | Admit: 2014-10-24 | Discharge: 2014-10-24 | Disposition: A | Discharge status: Home / Self Care | Payer: Medicare Other | Source: Ambulatory Visit | Attending: Radiation Oncology | Admitting: Radiation Oncology

## 2014-10-24 DIAGNOSIS — Z510 Encounter for antineoplastic radiation therapy: Secondary | ICD-10-CM | POA: Diagnosis not present

## 2014-10-24 DIAGNOSIS — C61 Malignant neoplasm of prostate: Secondary | ICD-10-CM | POA: Diagnosis not present

## 2014-10-25 ENCOUNTER — Ambulatory Visit
Admission: RE | Admit: 2014-10-25 | Discharge: 2014-10-25 | Disposition: A | Discharge status: Home / Self Care | Payer: Medicare Other | Source: Ambulatory Visit | Attending: Radiation Oncology | Admitting: Radiation Oncology

## 2014-10-25 ENCOUNTER — Encounter: Payer: Self-pay | Admitting: Radiation Oncology

## 2014-10-25 VITALS — BP 139/92 | HR 57 | Resp 16 | Wt 200.9 lb

## 2014-10-25 DIAGNOSIS — I10 Essential (primary) hypertension: Secondary | ICD-10-CM | POA: Diagnosis not present

## 2014-10-25 DIAGNOSIS — Z510 Encounter for antineoplastic radiation therapy: Secondary | ICD-10-CM | POA: Diagnosis not present

## 2014-10-25 DIAGNOSIS — C61 Malignant neoplasm of prostate: Secondary | ICD-10-CM | POA: Diagnosis not present

## 2014-10-25 DIAGNOSIS — E785 Hyperlipidemia, unspecified: Secondary | ICD-10-CM | POA: Diagnosis not present

## 2014-10-25 DIAGNOSIS — Z0000 Encounter for general adult medical examination without abnormal findings: Secondary | ICD-10-CM | POA: Diagnosis not present

## 2014-10-25 MED ORDER — PROCHLORPERAZINE MALEATE 10 MG PO TABS: 10.0000 mg | ORAL_TABLET | Freq: Four times a day (QID) | ORAL | Status: DC | PRN

## 2014-10-25 NOTE — Progress Notes (Addendum)
Weight and vitals stable. Denies pain. Denies nocturia. Describes a strong steady urine stream. Denies dysuria or hematuria. Reports blood in semen continues. Denies diarrhea. Denies leakage or incontinence. Reports nausea that persisted for eight hours yesterday following treatment. . BP 139/92 mmHg  Pulse 57  Resp 16  Wt 200 lb 14.4 oz (91.128 kg) Wt Readings from Last 3 Encounters:  10/25/14 200 lb 14.4 oz (91.128 kg)  10/18/14 203 lb 6.4 oz (92.262 kg)  05/07/14 198 lb (89.812 kg)

## 2014-10-25 NOTE — Progress Notes (Signed)
  Radiation Oncology         [phone]   Name: George [name]. MRN: [id number]   Date: 10/25/2014  DOB: January 31, 1946   Weekly Radiation Therapy Management    ICD-9-CM ICD-10-CM   1. Malignant neoplasm of prostate 185 C61     Current Dose: 15.2 Gy  Planned Dose:  76 Gy  Narrative The patient presents for routine under treatment assessment. Denies pain. Denies nocturia. Describes a strong steady urine stream. Denies dysuria or hematuria. Reports blood in semen continues. Denies diarrhea. Denies leakage or incontinence. Reports nausea that persisted for eight hours yesterday following treatment Set-up films were reviewed. The chart was checked.  Physical Findings  weight is 200 lb 14.4 oz (91.128 kg). His blood pressure is 139/92 and his pulse is 57. His respiration is 16.  Weight essentially stable.  No significant changes.  Impression The patient is tolerating radiation.  Plan Continue treatment as planned.  Will try compazine     Rodman Key A. Tammi Klippel, M.D.

## 2014-10-28 ENCOUNTER — Ambulatory Visit
Admission: RE | Admit: 2014-10-28 | Discharge: 2014-10-28 | Disposition: A | Discharge status: Home / Self Care | Payer: Medicare Other | Source: Ambulatory Visit | Attending: Radiation Oncology | Admitting: Radiation Oncology

## 2014-10-28 DIAGNOSIS — Z510 Encounter for antineoplastic radiation therapy: Secondary | ICD-10-CM | POA: Diagnosis not present

## 2014-10-28 DIAGNOSIS — C61 Malignant neoplasm of prostate: Secondary | ICD-10-CM | POA: Diagnosis not present

## 2014-10-29 ENCOUNTER — Ambulatory Visit
Admission: RE | Admit: 2014-10-29 | Discharge: 2014-10-29 | Disposition: A | Discharge status: Home / Self Care | Payer: Medicare Other | Source: Ambulatory Visit | Attending: Radiation Oncology | Admitting: Radiation Oncology

## 2014-10-29 DIAGNOSIS — C61 Malignant neoplasm of prostate: Secondary | ICD-10-CM | POA: Diagnosis not present

## 2014-10-29 DIAGNOSIS — Z510 Encounter for antineoplastic radiation therapy: Secondary | ICD-10-CM | POA: Diagnosis not present

## 2014-10-30 ENCOUNTER — Ambulatory Visit
Admission: RE | Admit: 2014-10-30 | Discharge: 2014-10-30 | Disposition: A | Discharge status: Home / Self Care | Payer: Medicare Other | Source: Ambulatory Visit | Attending: Radiation Oncology | Admitting: Radiation Oncology

## 2014-10-30 DIAGNOSIS — C61 Malignant neoplasm of prostate: Secondary | ICD-10-CM | POA: Diagnosis not present

## 2014-10-30 DIAGNOSIS — Z510 Encounter for antineoplastic radiation therapy: Secondary | ICD-10-CM | POA: Diagnosis not present

## 2014-10-31 ENCOUNTER — Ambulatory Visit
Admission: RE | Admit: 2014-10-31 | Discharge: 2014-10-31 | Disposition: A | Discharge status: Home / Self Care | Payer: Medicare Other | Source: Ambulatory Visit | Attending: Radiation Oncology | Admitting: Radiation Oncology

## 2014-10-31 DIAGNOSIS — C61 Malignant neoplasm of prostate: Secondary | ICD-10-CM | POA: Diagnosis not present

## 2014-10-31 DIAGNOSIS — Z510 Encounter for antineoplastic radiation therapy: Secondary | ICD-10-CM | POA: Diagnosis not present

## 2014-11-01 ENCOUNTER — Ambulatory Visit
Admission: RE | Admit: 2014-11-01 | Discharge: 2014-11-01 | Disposition: A | Discharge status: Home / Self Care | Payer: Medicare Other | Source: Ambulatory Visit | Attending: Radiation Oncology | Admitting: Radiation Oncology

## 2014-11-01 ENCOUNTER — Encounter: Payer: Self-pay | Admitting: Radiation Oncology

## 2014-11-01 VITALS — BP 136/83 | HR 57 | Resp 16 | Wt 198.9 lb

## 2014-11-01 DIAGNOSIS — C61 Malignant neoplasm of prostate: Secondary | ICD-10-CM | POA: Diagnosis not present

## 2014-11-01 DIAGNOSIS — Z510 Encounter for antineoplastic radiation therapy: Secondary | ICD-10-CM | POA: Diagnosis not present

## 2014-11-01 NOTE — Progress Notes (Signed)
Weight and vitals stable. Denies pain. Denies nocturia. Describes a strong steady urine stream. Denies dysuria or hematuria. Denies diarrhea.   BP 136/83 mmHg  Pulse 57  Resp 16  Wt 198 lb 14.4 oz (90.22 kg) Wt Readings from Last 3 Encounters:  11/01/14 198 lb 14.4 oz (90.22 kg)  10/25/14 200 lb 14.4 oz (91.128 kg)  10/18/14 203 lb 6.4 oz (92.262 kg)

## 2014-11-01 NOTE — Progress Notes (Signed)
  Radiation Oncology         [phone]   Name: George [name]. MRN: [id number]   Date: 11/01/2014  DOB: September 25, 1945   Weekly Radiation Therapy Management    ICD-9-CM ICD-10-CM   1. Malignant neoplasm of prostate 185 C61     Current Dose: 24.7 Gy  Planned Dose:  76 Gy  Narrative The patient presents for routine under treatment assessment. Weight and vitals stable. Denies pain. Denies nocturia. Describes a strong steady urine stream. Denies dysuria or hematuria. Denies diarrhea. Denies nausea. Set-up films were reviewed. The chart was checked.  Physical Findings  weight is 198 lb 14.4 oz (90.22 kg). His blood pressure is 136/83 and his pulse is 57. His respiration is 16.  Weight essentially stable.  No significant changes.  Impression The patient is tolerating radiation.  Plan Continue treatment as planned.     This document serves as a record of services personally performed by Tyler Pita, MD. It was created on his behalf by Arlyce Harman, a trained medical scribe. The creation of this record is based on the scribe's personal observations and the provider's statements to them. This document has been checked and approved by the attending provider.      [name], M.D.

## 2014-11-04 ENCOUNTER — Ambulatory Visit
Admission: RE | Admit: 2014-11-04 | Discharge: 2014-11-04 | Disposition: A | Discharge status: Home / Self Care | Payer: Medicare Other | Source: Ambulatory Visit | Attending: Radiation Oncology | Admitting: Radiation Oncology

## 2014-11-04 DIAGNOSIS — Z510 Encounter for antineoplastic radiation therapy: Secondary | ICD-10-CM | POA: Diagnosis not present

## 2014-11-04 DIAGNOSIS — C61 Malignant neoplasm of prostate: Secondary | ICD-10-CM | POA: Diagnosis not present

## 2014-11-05 ENCOUNTER — Ambulatory Visit
Admission: RE | Admit: 2014-11-05 | Discharge: 2014-11-05 | Disposition: A | Discharge status: Home / Self Care | Payer: Medicare Other | Source: Ambulatory Visit | Attending: Radiation Oncology | Admitting: Radiation Oncology

## 2014-11-05 DIAGNOSIS — Z510 Encounter for antineoplastic radiation therapy: Secondary | ICD-10-CM | POA: Diagnosis not present

## 2014-11-05 DIAGNOSIS — C61 Malignant neoplasm of prostate: Secondary | ICD-10-CM | POA: Diagnosis not present

## 2014-11-06 ENCOUNTER — Ambulatory Visit
Admission: RE | Admit: 2014-11-06 | Discharge: 2014-11-06 | Disposition: A | Discharge status: Home / Self Care | Payer: Medicare Other | Source: Ambulatory Visit | Attending: Radiation Oncology | Admitting: Radiation Oncology

## 2014-11-06 DIAGNOSIS — C61 Malignant neoplasm of prostate: Secondary | ICD-10-CM | POA: Diagnosis not present

## 2014-11-06 DIAGNOSIS — Z510 Encounter for antineoplastic radiation therapy: Secondary | ICD-10-CM | POA: Diagnosis not present

## 2014-11-07 ENCOUNTER — Ambulatory Visit
Admission: RE | Admit: 2014-11-07 | Discharge: 2014-11-07 | Disposition: A | Discharge status: Home / Self Care | Payer: Medicare Other | Source: Ambulatory Visit | Attending: Radiation Oncology | Admitting: Radiation Oncology

## 2014-11-07 ENCOUNTER — Encounter: Payer: Self-pay | Admitting: Radiation Oncology

## 2014-11-07 VITALS — BP 129/79 | HR 76 | Temp 97.8°F | Resp 20 | Wt 203.2 lb

## 2014-11-07 DIAGNOSIS — Z510 Encounter for antineoplastic radiation therapy: Secondary | ICD-10-CM | POA: Diagnosis not present

## 2014-11-07 DIAGNOSIS — C61 Malignant neoplasm of prostate: Secondary | ICD-10-CM | POA: Diagnosis not present

## 2014-11-07 NOTE — Progress Notes (Signed)
Weekly rad txs prostate 17/40 compleetd, no hematuria, no nocturia, good stream, stools more loose, drinks plenty water, has some fatigue stated,appetite good, drinks plenty of water no pain 10:22 AM BP 129/79 mmHg  Pulse 76  Temp(Src) 97.8 F (36.6 C) (Oral)  Resp 20  Wt 203 lb 3.2 oz (92.171 kg)  Wt Readings from Last 3 Encounters:  11/07/14 203 lb 3.2 oz (92.171 kg)  11/01/14 198 lb 14.4 oz (90.22 kg)  10/25/14 200 lb 14.4 oz (91.128 kg)

## 2014-11-07 NOTE — Progress Notes (Signed)
  Radiation Oncology         [phone]   Name: George [name]. MRN: [id number]   Date: 11/07/2014  DOB: May 08, 1945   Weekly Radiation Therapy Management  Diagnosis: George [name] is a 69 year old gentleman presenting to clinic in regards to his malignant neoplasm of the prostate.  Current Dose: 32.3 Gy  Planned Dose:  76 Gy  Narrative The patient presents for routine under treatment assessment and has completed 17 of 40 treatments to date. He denies hematuria, nocturia, or unintentional weight loss. He reports some fatigue, healthy appetite, and a good stream. His stools are more loose than usual. However, his weight and vitals are stable. Set-up films were reviewed and the chart was checked.  Physical Findings  weight is 203 lb 3.2 oz (92.171 kg). His oral temperature is 97.8 F (36.6 C). His blood pressure is 129/79 and his pulse is 76. His respiration is 20.  His weight is essentially stable with no significant changes in overall health to be noted at this time.  Impression George [name] is a 69 year old gentleman presenting to clinic in regards to his malignant neoplasm of the prostate. The patient is tolerating radiation.  Plan The patient was advised to continue treatment as planned and made aware of his follow up appointment to take place with radiation oncology. If he develops any further questions or concerns in regards to his treatment, he has been encouraged to contact Dr. Tammi Klippel, MD.     This document serves as a record of services personally performed by Tyler Pita, MD. It was created on his behalf by Lenn Cal, a trained medical scribe. The creation of this record is based on the scribe's personal observations and the provider's statements to them. This document has been checked and approved by the attending provider.  _________________________________________   Sheral Apley. Tammi Klippel, M.D.

## 2014-11-08 ENCOUNTER — Ambulatory Visit
Admission: RE | Admit: 2014-11-08 | Discharge: 2014-11-08 | Disposition: A | Discharge status: Home / Self Care | Payer: Medicare Other | Source: Ambulatory Visit | Attending: Radiation Oncology | Admitting: Radiation Oncology

## 2014-11-08 DIAGNOSIS — Z510 Encounter for antineoplastic radiation therapy: Secondary | ICD-10-CM | POA: Diagnosis not present

## 2014-11-08 DIAGNOSIS — C61 Malignant neoplasm of prostate: Secondary | ICD-10-CM | POA: Diagnosis not present

## 2014-11-11 ENCOUNTER — Ambulatory Visit
Admission: RE | Admit: 2014-11-11 | Discharge: 2014-11-11 | Disposition: A | Discharge status: Home / Self Care | Payer: Medicare Other | Source: Ambulatory Visit | Attending: Radiation Oncology | Admitting: Radiation Oncology

## 2014-11-11 DIAGNOSIS — Z510 Encounter for antineoplastic radiation therapy: Secondary | ICD-10-CM | POA: Diagnosis not present

## 2014-11-11 DIAGNOSIS — C61 Malignant neoplasm of prostate: Secondary | ICD-10-CM | POA: Diagnosis not present

## 2014-11-12 ENCOUNTER — Ambulatory Visit
Admission: RE | Admit: 2014-11-12 | Discharge: 2014-11-12 | Disposition: A | Discharge status: Home / Self Care | Payer: Medicare Other | Source: Ambulatory Visit | Attending: Radiation Oncology | Admitting: Radiation Oncology

## 2014-11-12 DIAGNOSIS — C61 Malignant neoplasm of prostate: Secondary | ICD-10-CM | POA: Diagnosis not present

## 2014-11-12 DIAGNOSIS — Z510 Encounter for antineoplastic radiation therapy: Secondary | ICD-10-CM | POA: Diagnosis not present

## 2014-11-13 ENCOUNTER — Ambulatory Visit
Admission: RE | Admit: 2014-11-13 | Discharge: 2014-11-13 | Disposition: A | Discharge status: Home / Self Care | Payer: Medicare Other | Source: Ambulatory Visit | Attending: Radiation Oncology | Admitting: Radiation Oncology

## 2014-11-13 DIAGNOSIS — Z510 Encounter for antineoplastic radiation therapy: Secondary | ICD-10-CM | POA: Diagnosis not present

## 2014-11-13 DIAGNOSIS — C61 Malignant neoplasm of prostate: Secondary | ICD-10-CM | POA: Diagnosis not present

## 2014-11-14 ENCOUNTER — Ambulatory Visit
Admission: RE | Admit: 2014-11-14 | Discharge: 2014-11-14 | Disposition: A | Discharge status: Home / Self Care | Payer: Medicare Other | Source: Ambulatory Visit | Attending: Radiation Oncology | Admitting: Radiation Oncology

## 2014-11-14 DIAGNOSIS — Z510 Encounter for antineoplastic radiation therapy: Secondary | ICD-10-CM | POA: Diagnosis not present

## 2014-11-14 DIAGNOSIS — C61 Malignant neoplasm of prostate: Secondary | ICD-10-CM | POA: Diagnosis not present

## 2014-11-15 ENCOUNTER — Encounter: Payer: Self-pay | Admitting: Radiation Oncology

## 2014-11-15 ENCOUNTER — Ambulatory Visit
Admission: RE | Admit: 2014-11-15 | Discharge: 2014-11-15 | Disposition: A | Discharge status: Home / Self Care | Payer: Medicare Other | Source: Ambulatory Visit | Attending: Radiation Oncology | Admitting: Radiation Oncology

## 2014-11-15 VITALS — BP 132/82 | HR 54 | Resp 16 | Wt 198.4 lb

## 2014-11-15 DIAGNOSIS — C61 Malignant neoplasm of prostate: Secondary | ICD-10-CM

## 2014-11-15 DIAGNOSIS — Z510 Encounter for antineoplastic radiation therapy: Secondary | ICD-10-CM | POA: Diagnosis not present

## 2014-11-15 NOTE — Progress Notes (Signed)
Weight and vitals stable. Denise pain. Reports he is managing diarrhea with Imodium AD. Reports nocturia x 1 if at all. Denies incontinence or leakage. Denies dysuria or hematuria. Denies fatigue.  BP 132/82 mmHg  Pulse 54  Resp 16  Wt 198 lb 6.4 oz (89.994 kg) Wt Readings from Last 3 Encounters:  11/15/14 198 lb 6.4 oz (89.994 kg)  11/07/14 203 lb 3.2 oz (92.171 kg)  11/01/14 198 lb 14.4 oz (90.22 kg)

## 2014-11-15 NOTE — Progress Notes (Signed)
Department of Radiation Oncology  Phone:  [phone] Fax:        [phone]  Weekly Treatment Note    Name: George [name]. Date: 11/15/2014 MRN: [id number] DOB: 1946-02-06   Current dose: 43.7 Gy  Current fraction: 23   MEDICATIONS: Current Outpatient Prescriptions  Medication Sig Dispense Refill  . atorvastatin (LIPITOR) 20 MG tablet Take 20 mg by mouth daily.    Marland Kitchen buPROPion (WELLBUTRIN XL) 150 MG 24 hr tablet Take 150 mg by mouth every morning.    Marland Kitchen buPROPion (WELLBUTRIN XL) 300 MG 24 hr tablet Take 300 mg by mouth every morning.    . felodipine (PLENDIL) 10 MG 24 hr tablet Take 10 mg by mouth daily.     . felodipine (PLENDIL) 5 MG 24 hr tablet Take 5 mg by mouth daily.    Marland Kitchen gabapentin (NEURONTIN) 100 MG capsule Take 100 mg by mouth 3 (three) times daily.    Marland Kitchen HYDROcodone-acetaminophen (NORCO/VICODIN) 5-325 MG per tablet Take 1-2 tablets by mouth every 4 (four) hours as needed for moderate pain. 20 tablet 0  . levofloxacin (LEVAQUIN) 500 MG tablet     . losartan (COZAAR) 100 MG tablet Take 100 mg by mouth daily.    . metoprolol (LOPRESSOR) 100 MG tablet Take 100 mg by mouth 2 (two) times daily.    . metoprolol succinate (TOPROL-XL) 100 MG 24 hr tablet     . potassium chloride (K-DUR) 10 MEQ tablet Take 10 mEq by mouth daily.    . potassium chloride SA (K-DUR,KLOR-CON) 20 MEQ tablet     . prochlorperazine (COMPAZINE) 10 MG tablet Take 1 tablet (10 mg total) by mouth every 6 (six) hours as needed for nausea or vomiting. 30 tablet 0  . risperiDONE (RISPERDAL) 1 MG tablet Take 1 mg by mouth as needed.      No current facility-administered medications for this encounter.     ALLERGIES: Amlodipine   LABORATORY DATA:  Lab Results  Component Value Date   WBC 7.6 05/02/2014   HGB 13.9 05/02/2014   HCT 43.3 05/02/2014   MCV 89.6 05/02/2014   PLT 336 05/02/2014   Lab Results  Component Value Date   NA 140 09/18/2009   K 4.8 09/18/2009   CL 107 09/18/2009   CO2  27 09/18/2009   Lab Results  Component Value Date   ALT 20 09/10/2009   AST 31 09/10/2009   ALKPHOS 76 09/10/2009   BILITOT 0.4 09/10/2009     NARRATIVE: George [name]. was seen today for weekly treatment management. The chart was checked and the patient's films were reviewed.  Weight and vitals stable. Denies pain. Reports he is managing diarrhea with Imodium AD. Reports nocturia x 1 if at all. Denies incontinence or leakage. Denies dysuria or hematuria. Denies fatigue.  PHYSICAL EXAMINATION: weight is 198 lb 6.4 oz (89.994 kg). His blood pressure is 132/82 and his pulse is 54. His respiration is 16.        ASSESSMENT: The patient is doing satisfactorily with treatment.  PLAN: We will continue with the patient's radiation treatment as planned.     ------------------------------------------------  Jodelle Gross, MD, PhD  This document serves as a record of services personally performed by Kyung Rudd, MD. It was created on his behalf by Derek Mound, a trained medical scribe. The creation of this record is based on the scribe's personal observations and the provider's statements to them. This document has been checked and approved by the attending  provider.

## 2014-11-18 ENCOUNTER — Ambulatory Visit
Admission: RE | Admit: 2014-11-18 | Discharge: 2014-11-18 | Disposition: A | Discharge status: Home / Self Care | Payer: Medicare Other | Source: Ambulatory Visit | Attending: Radiation Oncology | Admitting: Radiation Oncology

## 2014-11-18 DIAGNOSIS — C61 Malignant neoplasm of prostate: Secondary | ICD-10-CM | POA: Diagnosis not present

## 2014-11-18 DIAGNOSIS — Z510 Encounter for antineoplastic radiation therapy: Secondary | ICD-10-CM | POA: Diagnosis not present

## 2014-11-19 ENCOUNTER — Ambulatory Visit
Admission: RE | Admit: 2014-11-19 | Discharge: 2014-11-19 | Disposition: A | Discharge status: Home / Self Care | Payer: Medicare Other | Source: Ambulatory Visit | Attending: Radiation Oncology | Admitting: Radiation Oncology

## 2014-11-19 DIAGNOSIS — Z510 Encounter for antineoplastic radiation therapy: Secondary | ICD-10-CM | POA: Diagnosis not present

## 2014-11-19 DIAGNOSIS — C61 Malignant neoplasm of prostate: Secondary | ICD-10-CM | POA: Diagnosis not present

## 2014-11-20 ENCOUNTER — Ambulatory Visit
Admission: RE | Admit: 2014-11-20 | Discharge: 2014-11-20 | Disposition: A | Discharge status: Home / Self Care | Payer: Medicare Other | Source: Ambulatory Visit | Attending: Radiation Oncology | Admitting: Radiation Oncology

## 2014-11-20 DIAGNOSIS — C61 Malignant neoplasm of prostate: Secondary | ICD-10-CM | POA: Diagnosis not present

## 2014-11-20 DIAGNOSIS — Z510 Encounter for antineoplastic radiation therapy: Secondary | ICD-10-CM | POA: Diagnosis not present

## 2014-11-21 ENCOUNTER — Ambulatory Visit
Admission: RE | Admit: 2014-11-21 | Discharge: 2014-11-21 | Disposition: A | Discharge status: Home / Self Care | Payer: Medicare Other | Source: Ambulatory Visit | Attending: Radiation Oncology | Admitting: Radiation Oncology

## 2014-11-21 ENCOUNTER — Encounter: Payer: Self-pay | Admitting: Radiation Oncology

## 2014-11-21 VITALS — BP 138/78 | HR 52 | Resp 16 | Wt 199.0 lb

## 2014-11-21 DIAGNOSIS — C61 Malignant neoplasm of prostate: Secondary | ICD-10-CM

## 2014-11-21 DIAGNOSIS — Z510 Encounter for antineoplastic radiation therapy: Secondary | ICD-10-CM | POA: Diagnosis not present

## 2014-11-21 NOTE — Progress Notes (Signed)
Weight and vitals stable. Denies pain. Denies diarrhea. Reports nocturia x1 if at all. Denies incontinence or leakage. Denies dysuria or hematuria. Denies fatigue.   BP 138/78 mmHg  Pulse 52  Resp 16  Wt 199 lb (90.266 kg) Wt Readings from Last 3 Encounters:  11/21/14 199 lb (90.266 kg)  11/15/14 198 lb 6.4 oz (89.994 kg)  11/07/14 203 lb 3.2 oz (92.171 kg)

## 2014-11-21 NOTE — Progress Notes (Signed)
  Radiation Oncology         [phone]   Name: George [name]. MRN: [id number]   Date: 11/21/2014  DOB: [birth year]   Weekly Radiation Therapy Management  Diagnosis: George [name] is a 69 year old gentleman presenting to clinic in regards to his malignant neoplasm of the prostate.  Current Dose: 51.3 Gy  Planned Dose:  76 Gy  Narrative The patient presents for routine under treatment assessment and has completed 27 of 40 treatments to date. Denies pain. Denies diarrhea. Reports nocturia x1 if at all. Denies incontinence or leakage. Denies dysuria or hematuria. Denies fatigue.  Set-up films were reviewed and the chart was checked.  Physical Findings  weight is 199 lb (90.266 kg). His blood pressure is 138/78 and his pulse is 52. His respiration is 16.  His weight is essentially stable with no significant changes in overall health to be noted at this time.  Impression George [name] is a 69 year old gentleman presenting to clinic in regards to his malignant neoplasm of the prostate. The patient is tolerating radiation.  Plan The patient was advised to continue treatment as planned and made aware of his follow up appointment to take place with radiation oncology. If he develops any further questions or concerns in regards to his treatment, he has been encouraged to contact Dr. Tammi Klippel, MD.     This document serves as a record of services personally performed by Tyler Pita, MD. It was created on his behalf by Darcus Austin, a trained medical scribe. The creation of this record is based on the scribe's personal observations and the provider's statements to them. This document has been checked and approved by the attending provider.     _________________________________________   Sheral Apley. Tammi Klippel, M.D.

## 2014-11-22 ENCOUNTER — Ambulatory Visit
Admission: RE | Admit: 2014-11-22 | Discharge: 2014-11-22 | Disposition: A | Discharge status: Home / Self Care | Payer: Medicare Other | Source: Ambulatory Visit | Attending: Radiation Oncology | Admitting: Radiation Oncology

## 2014-11-22 DIAGNOSIS — C61 Malignant neoplasm of prostate: Secondary | ICD-10-CM | POA: Diagnosis not present

## 2014-11-22 DIAGNOSIS — Z510 Encounter for antineoplastic radiation therapy: Secondary | ICD-10-CM | POA: Diagnosis not present

## 2014-11-25 ENCOUNTER — Ambulatory Visit
Admission: RE | Admit: 2014-11-25 | Discharge: 2014-11-25 | Disposition: A | Discharge status: Home / Self Care | Payer: Medicare Other | Source: Ambulatory Visit | Attending: Radiation Oncology | Admitting: Radiation Oncology

## 2014-11-25 DIAGNOSIS — C61 Malignant neoplasm of prostate: Secondary | ICD-10-CM | POA: Diagnosis not present

## 2014-11-25 DIAGNOSIS — Z510 Encounter for antineoplastic radiation therapy: Secondary | ICD-10-CM | POA: Diagnosis not present

## 2014-11-26 ENCOUNTER — Ambulatory Visit
Admission: RE | Admit: 2014-11-26 | Discharge: 2014-11-26 | Disposition: A | Discharge status: Home / Self Care | Payer: Medicare Other | Source: Ambulatory Visit | Attending: Radiation Oncology | Admitting: Radiation Oncology

## 2014-11-26 DIAGNOSIS — C61 Malignant neoplasm of prostate: Secondary | ICD-10-CM | POA: Diagnosis not present

## 2014-11-26 DIAGNOSIS — Z510 Encounter for antineoplastic radiation therapy: Secondary | ICD-10-CM | POA: Diagnosis not present

## 2014-11-27 ENCOUNTER — Ambulatory Visit
Admission: RE | Admit: 2014-11-27 | Discharge: 2014-11-27 | Disposition: A | Discharge status: Home / Self Care | Payer: Medicare Other | Source: Ambulatory Visit | Attending: Radiation Oncology | Admitting: Radiation Oncology

## 2014-11-27 DIAGNOSIS — C61 Malignant neoplasm of prostate: Secondary | ICD-10-CM | POA: Diagnosis not present

## 2014-11-27 DIAGNOSIS — Z510 Encounter for antineoplastic radiation therapy: Secondary | ICD-10-CM | POA: Diagnosis not present

## 2014-11-28 ENCOUNTER — Ambulatory Visit
Admission: RE | Admit: 2014-11-28 | Discharge: 2014-11-28 | Disposition: A | Discharge status: Home / Self Care | Payer: Medicare Other | Source: Ambulatory Visit | Attending: Radiation Oncology | Admitting: Radiation Oncology

## 2014-11-28 DIAGNOSIS — Z510 Encounter for antineoplastic radiation therapy: Secondary | ICD-10-CM | POA: Diagnosis not present

## 2014-11-28 DIAGNOSIS — C61 Malignant neoplasm of prostate: Secondary | ICD-10-CM | POA: Diagnosis not present

## 2014-11-29 ENCOUNTER — Ambulatory Visit
Admission: RE | Admit: 2014-11-29 | Discharge: 2014-11-29 | Disposition: A | Discharge status: Home / Self Care | Payer: Medicare Other | Source: Ambulatory Visit | Attending: Radiation Oncology | Admitting: Radiation Oncology

## 2014-11-29 ENCOUNTER — Encounter: Payer: Self-pay | Admitting: Radiation Oncology

## 2014-11-29 VITALS — BP 134/84 | HR 50 | Resp 16 | Wt 193.0 lb

## 2014-11-29 DIAGNOSIS — C61 Malignant neoplasm of prostate: Secondary | ICD-10-CM | POA: Diagnosis not present

## 2014-11-29 DIAGNOSIS — Z510 Encounter for antineoplastic radiation therapy: Secondary | ICD-10-CM | POA: Diagnosis not present

## 2014-11-29 NOTE — Progress Notes (Addendum)
Weight and vitals stable. Denies pain. Reports he is managing diarrhea with Imodium. Reports nocturia x . Denies incontinence or leakage. Denies dysuria or hematuria. Denies fatigue.  BP 134/84 mmHg  Pulse 50  Resp 16  Wt 193 lb (87.544 kg) Wt Readings from Last 3 Encounters:  11/29/14 193 lb (87.544 kg)  11/21/14 199 lb (90.266 kg)  11/15/14 198 lb 6.4 oz (89.994 kg)

## 2014-11-29 NOTE — Progress Notes (Signed)
  Radiation Oncology         [phone]   Name: George [name]. MRN: [id number]   Date: 11/29/2014  DOB: [birth year]   Weekly Radiation Therapy Management  Diagnosis: George [name] is a 69 year old gentleman presenting to clinic in regards to his malignant neoplasm of the prostate.  Current Dose: 62.7 Gy  Planned Dose:  76 Gy  Narrative The patient presents for routine under treatment assessment. Weight and vitals stable. Denies pain. Reports he is managing diarrhea with Imodium. Reports nocturia "very seldom". Denies incontinence or leakage. Denies dysuria or hematuria. Denies fatigue.  Set-up films were reviewed and the chart was checked.  Physical Findings  weight is 193 lb (87.544 kg). His blood pressure is 134/84 and his pulse is 50. His respiration is 16.  His weight is essentially stable with no significant changes in overall health to be noted at this time.  Impression George [name] is a 69 year old gentleman presenting to clinic in regards to his malignant neoplasm of the prostate. The patient is tolerating radiation.  Plan The patient was advised to continue treatment as planned and made aware of his follow up appointment to take place with radiation oncology. If he develops any further questions or concerns in regards to his treatment, he has been encouraged to contact Dr. Tammi Klippel, MD.     This document serves as a record of services personally performed by Tyler Pita, MD. It was created on his behalf by Arlyce Harman, a trained medical scribe. The creation of this record is based on the scribe's personal observations and the provider's statements to them. This document has been checked and approved by the attending provider.    _________________________________________   Sheral Apley. Tammi Klippel, M.D.

## 2014-12-02 ENCOUNTER — Ambulatory Visit
Admission: RE | Admit: 2014-12-02 | Discharge: 2014-12-02 | Disposition: A | Discharge status: Home / Self Care | Payer: Medicare Other | Source: Ambulatory Visit | Attending: Radiation Oncology | Admitting: Radiation Oncology

## 2014-12-02 DIAGNOSIS — Z510 Encounter for antineoplastic radiation therapy: Secondary | ICD-10-CM | POA: Diagnosis not present

## 2014-12-02 DIAGNOSIS — C61 Malignant neoplasm of prostate: Secondary | ICD-10-CM | POA: Diagnosis not present

## 2014-12-03 ENCOUNTER — Ambulatory Visit
Admission: RE | Admit: 2014-12-03 | Discharge: 2014-12-03 | Disposition: A | Discharge status: Home / Self Care | Payer: Medicare Other | Source: Ambulatory Visit | Attending: Radiation Oncology | Admitting: Radiation Oncology

## 2014-12-03 DIAGNOSIS — C61 Malignant neoplasm of prostate: Secondary | ICD-10-CM | POA: Diagnosis not present

## 2014-12-03 DIAGNOSIS — Z510 Encounter for antineoplastic radiation therapy: Secondary | ICD-10-CM | POA: Diagnosis not present

## 2014-12-04 ENCOUNTER — Ambulatory Visit
Admission: RE | Admit: 2014-12-04 | Discharge: 2014-12-04 | Disposition: A | Discharge status: Home / Self Care | Payer: Medicare Other | Source: Ambulatory Visit | Attending: Radiation Oncology | Admitting: Radiation Oncology

## 2014-12-04 DIAGNOSIS — Z510 Encounter for antineoplastic radiation therapy: Secondary | ICD-10-CM | POA: Diagnosis not present

## 2014-12-04 DIAGNOSIS — C61 Malignant neoplasm of prostate: Secondary | ICD-10-CM | POA: Diagnosis not present

## 2014-12-05 ENCOUNTER — Ambulatory Visit
Admission: RE | Admit: 2014-12-05 | Discharge: 2014-12-05 | Disposition: A | Discharge status: Home / Self Care | Payer: Medicare Other | Source: Ambulatory Visit | Attending: Radiation Oncology | Admitting: Radiation Oncology

## 2014-12-05 DIAGNOSIS — Z510 Encounter for antineoplastic radiation therapy: Secondary | ICD-10-CM | POA: Diagnosis not present

## 2014-12-05 DIAGNOSIS — C61 Malignant neoplasm of prostate: Secondary | ICD-10-CM | POA: Diagnosis not present

## 2014-12-06 ENCOUNTER — Ambulatory Visit
Admission: RE | Admit: 2014-12-06 | Discharge: 2014-12-06 | Disposition: A | Discharge status: Home / Self Care | Payer: Medicare Other | Source: Ambulatory Visit | Attending: Radiation Oncology | Admitting: Radiation Oncology

## 2014-12-06 VITALS — BP 151/86 | HR 46 | Resp 16 | Wt 198.1 lb

## 2014-12-06 DIAGNOSIS — C61 Malignant neoplasm of prostate: Secondary | ICD-10-CM | POA: Diagnosis not present

## 2014-12-06 DIAGNOSIS — Z510 Encounter for antineoplastic radiation therapy: Secondary | ICD-10-CM | POA: Diagnosis not present

## 2014-12-06 NOTE — Progress Notes (Signed)
  Radiation Oncology         [phone]   Name: George [name]. MRN: [id number]   Date: 12/06/2014  DOB: 10/14/1945   Weekly Radiation Therapy Management  Diagnosis: George [name] is a 69 year old gentleman presenting to clinic in regards to his malignant neoplasm of the prostate.  Current Dose: 72.2 Gy  Planned Dose:  76 Gy  Narrative The patient presents for routine under treatment assessment.  Weight stable. Heart rate slightly lower than normal. Patient denies feeling lightheaded or dizzy. Reports he continues to manage diarrhea with Imodium. Denies nocturia. Denies incontinence, leakage, dysuria or hematuria. Denies fatigue. One month follow up appointment card given. Patient completes treatment on Tuesday. Patient understands to contact this RN with future needs.  Set-up films were reviewed and the chart was checked.  Physical Findings  weight is 198 lb 1.6 oz (89.858 kg). His blood pressure is 151/86 and his pulse is 46. His respiration is 16.  His weight is essentially stable with no significant changes in overall health to be noted at this time.  Impression George [name] is a 69 year old gentleman presenting to clinic in regards to his malignant neoplasm of the prostate. The patient is tolerating radiation.  Plan The patient was advised to continue treatment as planned and made aware of his follow up appointment to take place with radiation oncology.   This document serves as a record of services personally performed by Tyler Pita, MD. It was created on his behalf by Janace Hoard, a trained medical scribe. The creation of this record is based on the scribe's personal observations and the provider's statements to them. This document has been checked and approved by the attending provider.    _________________________________________   Sheral Apley. Tammi Klippel, M.D.

## 2014-12-06 NOTE — Progress Notes (Addendum)
Weight stable. Heart rate slightly lower than normal. Patient denies feeling lightheaded or dizzy. Reports he continues to manage diarrhea with Imodium. Denies nocturia. Denies incontinence, leakage, dysuria or hematuria. Denies fatigue. One month follow up appointment card given. Patient completes treatment on Tuesday. Patient understands to contact this RN with future needs.   BP 151/86 mmHg  Pulse 46  Resp 16  Wt 198 lb 1.6 oz (89.858 kg) Wt Readings from Last 3 Encounters:  12/06/14 198 lb 1.6 oz (89.858 kg)  11/29/14 193 lb (87.544 kg)  11/21/14 199 lb (90.266 kg)

## 2014-12-09 ENCOUNTER — Ambulatory Visit
Admission: RE | Admit: 2014-12-09 | Discharge: 2014-12-09 | Disposition: A | Discharge status: Home / Self Care | Payer: Medicare Other | Source: Ambulatory Visit | Attending: Radiation Oncology | Admitting: Radiation Oncology

## 2014-12-09 DIAGNOSIS — Z510 Encounter for antineoplastic radiation therapy: Secondary | ICD-10-CM | POA: Diagnosis not present

## 2014-12-09 DIAGNOSIS — C61 Malignant neoplasm of prostate: Secondary | ICD-10-CM | POA: Diagnosis not present

## 2014-12-10 ENCOUNTER — Encounter: Payer: Self-pay | Admitting: Radiation Oncology

## 2014-12-10 ENCOUNTER — Ambulatory Visit
Admission: RE | Admit: 2014-12-10 | Discharge: 2014-12-10 | Disposition: A | Discharge status: Home / Self Care | Payer: Medicare Other | Source: Ambulatory Visit | Attending: Radiation Oncology | Admitting: Radiation Oncology

## 2014-12-10 DIAGNOSIS — C61 Malignant neoplasm of prostate: Secondary | ICD-10-CM | POA: Diagnosis not present

## 2014-12-10 DIAGNOSIS — Z510 Encounter for antineoplastic radiation therapy: Secondary | ICD-10-CM | POA: Diagnosis not present

## 2014-12-17 NOTE — Progress Notes (Signed)
  Radiation Oncology         [phone]) [phone] ________________________________  Name: George [name]. MRN: [id number]  Date: 12/10/2014  DOB: 08-Jan-1946  End of Treatment Note   ICD-9-CM ICD-10-CM    1. Malignant neoplasm of prostate 185 C61     DIAGNOSIS: 69 y.o. gentleman with stage T2a adenocarcinoma of the prostate with a Gleason's score of 3+3 and a PSA of 4.47     Indication for treatment:  Curative, Definitive Radiotherapy       Radiation treatment dates:   10/16/2014-12/10/2014  Site/dose:   The prostate was treated to 76 Gy in 40 fractions of 1.9 Gy  Beams/energy:   The patient was treated with IMRT using volumetric arc therapy delivering 6 MV X-rays to clockwise and counterclockwise circumferential arcs with a 90 degree collimator offset to avoid dose scalloping.  Image guidance was performed with daily cone beam CT prior to each fraction to align to gold markers in the prostate and assure proper bladder and rectal fill volumes.  Immobilization was achieved with [employer] custom mold.  Narrative: The patient tolerated radiation treatment relatively well.   The patient experienced some minor urinary irritation and modest fatigue.  Reports he to managed diarrhea with Imodium. Denied nocturia. Denied incontinence, leakage, dysuria or hematuria. Denied fatigue.   Plan: The patient has completed radiation treatment. He will return to radiation oncology clinic for routine followup in one month. I advised him to call or return sooner if he has any questions or concerns related to his recovery or treatment. ________________________________  Sheral Apley. Tammi Klippel, M.D.

## 2015-01-09 ENCOUNTER — Ambulatory Visit
Admission: RE | Admit: 2015-01-09 | Discharge: 2015-01-09 | Disposition: A | Discharge status: Home / Self Care | Payer: Medicare Other | Source: Ambulatory Visit | Attending: Radiation Oncology | Admitting: Radiation Oncology

## 2015-01-10 ENCOUNTER — Telehealth: Payer: Self-pay | Admitting: Radiation Oncology

## 2015-01-10 NOTE — Telephone Encounter (Signed)
Patient did not show for follow up appointment with Dr. Tammi Klippel. Phoned his home to inquire. Patient reports he desired to cancel the appointment and not reschedule. Encouraged patient to continue to follow up with his urologist. Patient verbalized understanding.

## 2015-02-13 DIAGNOSIS — M1712 Unilateral primary osteoarthritis, left knee: Secondary | ICD-10-CM | POA: Diagnosis not present

## 2015-02-20 DIAGNOSIS — I10 Essential (primary) hypertension: Secondary | ICD-10-CM | POA: Diagnosis not present

## 2015-02-20 DIAGNOSIS — M179 Osteoarthritis of knee, unspecified: Secondary | ICD-10-CM | POA: Diagnosis not present

## 2015-02-20 DIAGNOSIS — Z01818 Encounter for other preprocedural examination: Secondary | ICD-10-CM | POA: Diagnosis not present

## 2015-04-02 DIAGNOSIS — C61 Malignant neoplasm of prostate: Secondary | ICD-10-CM | POA: Diagnosis not present

## 2015-04-09 ENCOUNTER — Encounter (HOSPITAL_COMMUNITY): Payer: Self-pay | Admitting: Physician Assistant

## 2015-04-09 DIAGNOSIS — I10 Essential (primary) hypertension: Secondary | ICD-10-CM | POA: Diagnosis not present

## 2015-04-09 DIAGNOSIS — Z01818 Encounter for other preprocedural examination: Secondary | ICD-10-CM | POA: Diagnosis not present

## 2015-04-09 DIAGNOSIS — F410 Panic disorder [episodic paroxysmal anxiety] without agoraphobia: Secondary | ICD-10-CM

## 2015-04-09 DIAGNOSIS — M1712 Unilateral primary osteoarthritis, left knee: Secondary | ICD-10-CM | POA: Diagnosis present

## 2015-04-09 DIAGNOSIS — F411 Generalized anxiety disorder: Secondary | ICD-10-CM

## 2015-04-09 HISTORY — DX: Generalized anxiety disorder: F41.1

## 2015-04-09 HISTORY — DX: Panic disorder (episodic paroxysmal anxiety): F41.0

## 2015-04-09 NOTE — H&P (Signed)
TOTAL KNEE ADMISSION H&P  Patient is being admitted for left total knee arthroplasty.  Subjective:  Chief Complaint:left knee pain.  HPI: George [name]., 69 y.o. male, has a history of pain and functional disability in the left knee due to arthritis and has failed non-surgical conservative treatments for greater than 12 weeks to includeNSAID's and/or analgesics, corticosteriod injections, viscosupplementation injections, flexibility and strengthening excercises, supervised PT with diminished ADL's post treatment, weight reduction as appropriate and activity modification.  Onset of symptoms was gradual, starting 10 years ago with gradually worsening course since that time. The patient noted no past surgery on the left knee(s).  Patient currently rates pain in the left knee(s) at 10 out of 10 with activity. Patient has night pain, worsening of pain with activity and weight bearing, pain that interferes with activities of daily living, crepitus and joint swelling.  Patient has evidence of subchondral sclerosis, periarticular osteophytes and joint space narrowing by imaging studies. There is no active infection.  Patient Active Problem List   Diagnosis Date Noted  . Primary localized osteoarthritis of left knee 04/09/2015  . Generalized anxiety disorder 04/09/2015  . Panic attacks 04/09/2015  . Spermatocele 05/08/2014  . Benign localized hyperplasia of prostate with urinary obstruction 05/07/2014  . Malignant neoplasm of prostate ([place]) 04/22/2014   Past Medical History  Diagnosis Date  . Elevated prostate specific antigen (PSA)   . Hematospermia   . Incomplete bladder emptying   . Nodular prostate with lower urinary tract symptoms   . Prostatitis   . Spermatocele   . Weak urinary stream   . Hx antineoplastic chemotherapy 2010  . Anxiety   . Depression   . Hypertension   . Malignant neoplasm of prostate ([place])   . Colon cancer ([place])   . 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 Generalized anxiety disorder 04/09/2015  . Panic attacks 04/09/2015    Past Surgical History  Procedure Laterality Date  . Colon resection   NOV 2009  . Prostate biopsy    . Transurethral resection of prostate N/A 05/07/2014    Procedure: TRANSURETHRAL RESECTION OF THE PROSTATE WITH GYRUS INSTRUMENTS;  Surgeon: Malka So, MD;  Location: [facility];  Service: Urology;  Laterality: N/A;  . Spermatocelectomy Left 05/07/2014    Procedure: LEFT SPERMATOCELECTOMY;  Surgeon: Malka So, MD;  Location: [facility];  Service: Urology;  Laterality: Left;    No prescriptions prior to admission   Allergies  Allergen Reactions  . Amlodipine     Dizziness     Social History  Substance Use Topics  . Smoking status: Former Smoker    Quit date: 04/12/1972  . Smokeless tobacco: Never Used     Comment: smoked 1 cigarette per day SOME DAYS  . Alcohol Use: 0.0 oz/week    0 Standard drinks or equivalent per week     Comment: OCCASIONAL    Family History  Problem Relation Age of Onset  . Stroke Father   . Cancer Brother     neoplasm of brain     Review of Systems  Constitutional: Negative.   HENT: Negative.   Eyes: Negative.   Cardiovascular: Negative.   Gastrointestinal: Negative.   Musculoskeletal: Positive for back pain and joint pain.  Skin: Negative.   Neurological: Negative.   Endo/Heme/Allergies: Negative.   Psychiatric/Behavioral: Negative.     Objective:  Physical Exam  Constitutional: He appears well-developed and well-nourished.  HENT:  Head: Normocephalic and atraumatic.  Mouth/Throat: Oropharynx is clear and moist.  Eyes: Conjunctivae are normal. Pupils are equal, round, and reactive to light.  Neck: Neck supple.  Cardiovascular: Normal rate and regular rhythm.   Respiratory: Effort normal and breath sounds normal.  GI: Soft. Bowel sounds are normal.  Genitourinary:  Not pertinent to current symptomatology therefore not examined.    Vital  signs in last 24 hours: Temp:  [97.8 F (36.6 C)] 97.8 F (36.6 C) (12/28 1400) Pulse Rate:  [65] 65 (12/28 1400) BP: (143)/(75) 143/75 mmHg (12/28 1400) SpO2:  [95 %] 95 % (12/28 1400) Weight:  [86.637 kg (191 lb)] 86.637 kg (191 lb) (12/28 1400)  Labs:   Estimated body mass index is 28.19 kg/(m^2) as calculated from the following:   Height as of this encounter: 5\' 9"  (1.753 m).   Weight as of this encounter: 86.637 kg (191 lb).   Imaging Review Plain radiographs demonstrate severe degenerative joint disease of the left knee(s). The overall alignment issignificant varus. The bone quality appears to be good for age and reported activity level.  Assessment/Plan:  End stage arthritis, left knee   The patient history, physical examination, clinical judgment of the provider and imaging studies are consistent with end stage degenerative joint disease of the left knee(s) and total knee arthroplasty is deemed medically necessary. The treatment options including medical management, injection therapy arthroscopy and arthroplasty were discussed at length. The risks and benefits of total knee arthroplasty were presented and reviewed. The risks due to aseptic loosening, infection, stiffness, patella tracking problems, thromboembolic complications and other imponderables were discussed. The patient acknowledged the explanation, agreed to proceed with the plan and consent was signed. Patient is being admitted for inpatient treatment for surgery, pain control, PT, OT, prophylactic antibiotics, VTE prophylaxis, progressive ambulation and ADL's and discharge planning. The patient is planning to be discharged home with home health services

## 2015-04-10 NOTE — Pre-Procedure Instructions (Signed)
George Sielski Jr.  04/10/2015      [facility] - [place] ([place]), [place] [id number] [place] [address] Phone: [phone] Fax: [phone]  [facility] [place], [place] [address] Phone: [phone] Fax: [phone]    Your procedure is scheduled on Monday, January 9th   Report to [facility] Admitting at 9:00 AM             (Posted surgery time 11:00 am to 1:00 pm)   Call this number if you have problems the morning of surgery:  [phone]   Remember:  Do not eat food or drink liquids after midnight Sunday.   Take these medicines the morning of surgery with A SIP OF WATER : Wellbutrin, Norco, Toprol   Do not wear jewelry - no rings or watches.  Do not wear lotions or colognes.     You may NOT wear deodorant the day of surgery.              Men may shave face and neck.  Do not bring valuables to the hospital.  [facility] is not responsible for any belongings or valuables.  Contacts, dentures or bridgework may not be worn into surgery.  Leave your suitcase in the car.  After surgery it may be brought to your room. For patients admitted to the hospital, discharge time will be determined by your treatment team.   Name and phone number of your driver:     Please read over the following fact sheets that you were given. Pain Booklet, Coughing and Deep Breathing, MRSA Information and Surgical Site Infection Prevention

## 2015-04-11 ENCOUNTER — Encounter (HOSPITAL_COMMUNITY)
Admission: RE | Admit: 2015-04-11 | Discharge: 2015-04-11 | Disposition: A | Discharge status: Home / Self Care | Payer: Medicare Other | Source: Ambulatory Visit | Attending: Orthopedic Surgery | Admitting: Orthopedic Surgery

## 2015-04-11 ENCOUNTER — Encounter (HOSPITAL_COMMUNITY): Payer: Self-pay

## 2015-04-11 DIAGNOSIS — M1712 Unilateral primary osteoarthritis, left knee: Secondary | ICD-10-CM | POA: Diagnosis not present

## 2015-04-11 DIAGNOSIS — Z01812 Encounter for preprocedural laboratory examination: Secondary | ICD-10-CM | POA: Diagnosis not present

## 2015-04-11 DIAGNOSIS — Z0183 Encounter for blood typing: Secondary | ICD-10-CM | POA: Diagnosis not present

## 2015-04-11 HISTORY — DX: Unspecified osteoarthritis, unspecified site: M19.90

## 2015-04-11 LAB — COMPREHENSIVE METABOLIC PANEL
ALK PHOS: 80 U/L (ref 38–126)
ALT: 25 U/L (ref 17–63)
ANION GAP: 8 (ref 5–15)
AST: 31 U/L (ref 15–41)
Albumin: 3.9 g/dL (ref 3.5–5.0)
BUN: 10 mg/dL (ref 6–20)
CALCIUM: 9.5 mg/dL (ref 8.9–10.3)
CO2: 25 mmol/L (ref 22–32)
CREATININE: 1.35 mg/dL — AB (ref 0.61–1.24)
Chloride: 107 mmol/L (ref 101–111)
GFR, EST AFRICAN AMERICAN: 59 mL/min — AB (ref >60)
GFR, EST NON AFRICAN AMERICAN: 51 mL/min — AB (ref >60)
Glucose, Bld: 100 mg/dL — ABNORMAL HIGH (ref 65–99)
Potassium: 4.1 mmol/L (ref 3.5–5.1)
SODIUM: 140 mmol/L (ref 135–145)
TOTAL PROTEIN: 7.1 g/dL (ref 6.5–8.1)
Total Bilirubin: 0.8 mg/dL (ref 0.3–1.2)

## 2015-04-11 LAB — TYPE AND SCREEN
ABO/RH(D): B POS
Antibody Screen: NEGATIVE

## 2015-04-11 LAB — CBC WITH DIFFERENTIAL/PLATELET
Basophils Absolute: 0 10*3/uL (ref 0.0–0.1)
Basophils Relative: 0 %
EOS ABS: 0.2 10*3/uL (ref 0.0–0.7)
EOS PCT: 3 %
HCT: 40.5 % (ref 39.0–52.0)
HEMOGLOBIN: 12.8 g/dL — AB (ref 13.0–17.0)
LYMPHS ABS: 1.5 10*3/uL (ref 0.7–4.0)
LYMPHS PCT: 21 %
MCH: 27.8 pg (ref 26.0–34.0)
MCHC: 31.6 g/dL (ref 30.0–36.0)
MCV: 87.9 fL (ref 78.0–100.0)
MONOS PCT: 4 %
Monocytes Absolute: 0.3 10*3/uL (ref 0.1–1.0)
NEUTROS PCT: 72 %
Neutro Abs: 5 10*3/uL (ref 1.7–7.7)
Platelets: 262 10*3/uL (ref 150–400)
RBC: 4.61 MIL/uL (ref 4.22–5.81)
RDW: 13.5 % (ref 11.5–15.5)
WBC: 7 10*3/uL (ref 4.0–10.5)

## 2015-04-11 LAB — APTT: aPTT: 27 seconds (ref 24–37)

## 2015-04-11 LAB — ABO/RH: ABO/RH(D): B POS

## 2015-04-11 LAB — SURGICAL PCR SCREEN
MRSA, PCR: NEGATIVE
Staphylococcus aureus: NEGATIVE

## 2015-04-11 LAB — PROTIME-INR
INR: 1.04 (ref 0.00–1.49)
PROTHROMBIN TIME: 13.8 s (ref 11.6–15.2)

## 2015-04-11 NOTE — Progress Notes (Signed)
Denies any cardiac issues, and has never seen a cardio. PCP is Dr. Orland Mustard Sadie Haber @ [place] was 04/09/2015 Did have prostate cancer which back in 2009 had chemo, and radiation in July 2016.  Was hospitalized in January 2016 for " bladder blockage"   Dr. Jeffie Pollock is Urologist.

## 2015-04-12 LAB — URINE CULTURE: Culture: NO GROWTH

## 2015-04-20 MED ORDER — CHLORHEXIDINE GLUCONATE 4 % EX LIQD: 60.0000 mL | Freq: Once | CUTANEOUS | Status: DC

## 2015-04-20 MED ORDER — LACTATED RINGERS IV SOLN: INTRAVENOUS | Status: DC

## 2015-04-20 MED ORDER — CEFAZOLIN SODIUM-DEXTROSE 2-3 GM-% IV SOLR
2.0000 g | INTRAVENOUS | Status: AC
  Administered 2015-04-21: 2 g via INTRAVENOUS
  Filled 2015-04-20: qty 50

## 2015-04-20 MED ORDER — POVIDONE-IODINE 7.5 % EX SOLN
Freq: Once | CUTANEOUS | Status: DC
  Filled 2015-04-20: qty 118

## 2015-04-20 NOTE — Anesthesia Preprocedure Evaluation (Addendum)
Anesthesia Evaluation  Patient identified by MRN, date of birth, ID band Patient awake    Reviewed: Allergy & Precautions, NPO status , Patient's Chart, lab work & pertinent test results  History of Anesthesia Complications Negative for: history of anesthetic complications  Airway Mallampati: I  TM Distance: >3 FB Neck ROM: Full    Dental  (+) Edentulous Upper, Edentulous Lower, Upper Dentures, Lower Dentures   Pulmonary former smoker (quit 1974),    Pulmonary exam normal breath sounds clear to auscultation       Cardiovascular hypertension, Pt. on medications and Pt. on home beta blockers (-) angina(-) CAD, (-) Past MI and (-) CHF Normal cardiovascular exam(-) dysrhythmias  Rhythm:Regular Rate:Normal     Neuro/Psych neg Seizures PSYCHIATRIC DISORDERS Anxiety Depression negative neurological ROS     GI/Hepatic negative GI ROS, Neg liver ROS,   Endo/Other  negative endocrine ROS  Renal/GU negative Renal ROS     Musculoskeletal  (+) Arthritis , Osteoarthritis,    Abdominal   Peds  Hematology negative hematology ROS (+) 12/40,  INR 1.04   Anesthesia Other Findings   Reproductive/Obstetrics                            Anesthesia Physical  Anesthesia Plan  ASA: II  Anesthesia Plan: Spinal   Post-op Pain Management: MAC Combined w/ Regional for Post-op pain   Induction: Intravenous  Airway Management Planned: Nasal Cannula  Additional Equipment: None  Intra-op Plan:   Post-operative Plan:   Informed Consent: I have reviewed the patients History and Physical, chart, labs and discussed the procedure including the risks, benefits and alternatives for the proposed anesthesia with the patient or authorized representative who has indicated his/her understanding and acceptance.   Dental advisory given  Plan Discussed with: CRNA, Surgeon and Anesthesiologist  Anesthesia Plan Comments:  (Spinal plus AC Block)       Anesthesia Quick Evaluation

## 2015-04-21 ENCOUNTER — Encounter (HOSPITAL_COMMUNITY)
Admission: RE | Disposition: A | Discharge status: Home Health | Payer: Self-pay | Source: Ambulatory Visit | Attending: Orthopedic Surgery

## 2015-04-21 ENCOUNTER — Inpatient Hospital Stay (HOSPITAL_COMMUNITY)
Admission: RE | Admit: 2015-04-21 | Discharge: 2015-04-22 | DRG: 470 | Disposition: A | Discharge status: Home Health | Payer: Medicare Other | Source: Ambulatory Visit | Attending: Orthopedic Surgery | Admitting: Orthopedic Surgery

## 2015-04-21 ENCOUNTER — Inpatient Hospital Stay (HOSPITAL_COMMUNITY): Payer: Medicare Other | Admitting: Anesthesiology

## 2015-04-21 ENCOUNTER — Encounter (HOSPITAL_COMMUNITY): Payer: Self-pay | Admitting: Surgery

## 2015-04-21 DIAGNOSIS — I10 Essential (primary) hypertension: Secondary | ICD-10-CM | POA: Diagnosis present

## 2015-04-21 DIAGNOSIS — Z87891 Personal history of nicotine dependence: Secondary | ICD-10-CM

## 2015-04-21 DIAGNOSIS — Z791 Long term (current) use of non-steroidal anti-inflammatories (NSAID): Secondary | ICD-10-CM

## 2015-04-21 DIAGNOSIS — C61 Malignant neoplasm of prostate: Secondary | ICD-10-CM | POA: Diagnosis present

## 2015-04-21 DIAGNOSIS — Z85038 Personal history of other malignant neoplasm of large intestine: Secondary | ICD-10-CM

## 2015-04-21 DIAGNOSIS — G620 Drug-induced polyneuropathy: Secondary | ICD-10-CM | POA: Diagnosis present

## 2015-04-21 DIAGNOSIS — N4340 Spermatocele of epididymis, unspecified: Secondary | ICD-10-CM | POA: Diagnosis present

## 2015-04-21 DIAGNOSIS — M1712 Unilateral primary osteoarthritis, left knee: Principal | ICD-10-CM | POA: Diagnosis present

## 2015-04-21 DIAGNOSIS — G8918 Other acute postprocedural pain: Secondary | ICD-10-CM | POA: Diagnosis not present

## 2015-04-21 DIAGNOSIS — T451X5S Adverse effect of antineoplastic and immunosuppressive drugs, sequela: Secondary | ICD-10-CM | POA: Diagnosis not present

## 2015-04-21 DIAGNOSIS — M1710 Unilateral primary osteoarthritis, unspecified knee: Secondary | ICD-10-CM | POA: Diagnosis present

## 2015-04-21 DIAGNOSIS — M25562 Pain in left knee: Secondary | ICD-10-CM | POA: Diagnosis not present

## 2015-04-21 DIAGNOSIS — N138 Other obstructive and reflux uropathy: Secondary | ICD-10-CM | POA: Diagnosis present

## 2015-04-21 DIAGNOSIS — F411 Generalized anxiety disorder: Secondary | ICD-10-CM | POA: Diagnosis present

## 2015-04-21 DIAGNOSIS — N401 Enlarged prostate with lower urinary tract symptoms: Secondary | ICD-10-CM | POA: Diagnosis present

## 2015-04-21 DIAGNOSIS — F410 Panic disorder [episodic paroxysmal anxiety] without agoraphobia: Secondary | ICD-10-CM | POA: Diagnosis present

## 2015-04-21 DIAGNOSIS — M179 Osteoarthritis of knee, unspecified: Secondary | ICD-10-CM | POA: Diagnosis present

## 2015-04-21 DIAGNOSIS — Z8546 Personal history of malignant neoplasm of prostate: Secondary | ICD-10-CM | POA: Diagnosis not present

## 2015-04-21 HISTORY — DX: Panic disorder (episodic paroxysmal anxiety): F41.0

## 2015-04-21 HISTORY — DX: Generalized anxiety disorder: F41.1

## 2015-04-21 HISTORY — PX: TOTAL KNEE ARTHROPLASTY: SHX125

## 2015-04-21 LAB — CBC
HEMATOCRIT: 33.5 % — AB (ref 39.0–52.0)
HEMOGLOBIN: 10.7 g/dL — AB (ref 13.0–17.0)
MCH: 28.3 pg (ref 26.0–34.0)
MCHC: 31.9 g/dL (ref 30.0–36.0)
MCV: 88.6 fL (ref 78.0–100.0)
Platelets: 218 10*3/uL (ref 150–400)
RBC: 3.78 MIL/uL — AB (ref 4.22–5.81)
RDW: 13.7 % (ref 11.5–15.5)
WBC: 11.8 10*3/uL — ABNORMAL HIGH (ref 4.0–10.5)

## 2015-04-21 LAB — CREATININE, SERUM
Creatinine, Ser: 0.93 mg/dL (ref 0.61–1.24)
GFR calc Af Amer: >60 mL/min (ref >60)
GFR calc non Af Amer: >60 mL/min (ref >60)

## 2015-04-21 SURGERY — ARTHROPLASTY, KNEE, TOTAL
Anesthesia: Spinal | Site: Knee | Laterality: Left

## 2015-04-21 MED ORDER — HYDROMORPHONE HCL 1 MG/ML IJ SOLN
1.0000 mg | INTRAMUSCULAR | Status: DC | PRN
  Administered 2015-04-21: 1 mg via INTRAVENOUS
  Filled 2015-04-21: qty 1

## 2015-04-21 MED ORDER — BUPROPION HCL ER (XL) 150 MG PO TB24
150.0000 mg | ORAL_TABLET | Freq: Every morning | ORAL | Status: DC
  Administered 2015-04-22: 150 mg via ORAL
  Filled 2015-04-21: qty 1

## 2015-04-21 MED ORDER — BUPIVACAINE-EPINEPHRINE (PF) 0.25% -1:200000 IJ SOLN
INTRAMUSCULAR | Status: AC
  Filled 2015-04-21: qty 30

## 2015-04-21 MED ORDER — DEXAMETHASONE SODIUM PHOSPHATE 10 MG/ML IJ SOLN
10.0000 mg | Freq: Three times a day (TID) | INTRAMUSCULAR | Status: DC
  Administered 2015-04-21 – 2015-04-22 (×3): 10 mg via INTRAVENOUS
  Filled 2015-04-21 (×3): qty 1

## 2015-04-21 MED ORDER — DEXAMETHASONE SODIUM PHOSPHATE 10 MG/ML IJ SOLN
INTRAMUSCULAR | Status: AC
  Filled 2015-04-21: qty 1

## 2015-04-21 MED ORDER — ONDANSETRON HCL 4 MG PO TABS: 4.0000 mg | ORAL_TABLET | Freq: Four times a day (QID) | ORAL | Status: DC | PRN

## 2015-04-21 MED ORDER — CEFUROXIME SODIUM 1.5 G IJ SOLR
INTRAMUSCULAR | Status: AC
  Filled 2015-04-21: qty 3

## 2015-04-21 MED ORDER — PROMETHAZINE HCL 25 MG/ML IJ SOLN: 6.2500 mg | INTRAMUSCULAR | Status: DC | PRN

## 2015-04-21 MED ORDER — BUPIVACAINE-EPINEPHRINE (PF) 0.5% -1:200000 IJ SOLN
INTRAMUSCULAR | Status: DC | PRN
  Administered 2015-04-21: 30 mL via PERINEURAL

## 2015-04-21 MED ORDER — MIDAZOLAM HCL 2 MG/2ML IJ SOLN
INTRAMUSCULAR | Status: AC
  Filled 2015-04-21: qty 2

## 2015-04-21 MED ORDER — MEPERIDINE HCL 25 MG/ML IJ SOLN: 6.2500 mg | INTRAMUSCULAR | Status: DC | PRN

## 2015-04-21 MED ORDER — ALUM & MAG HYDROXIDE-SIMETH 200-200-20 MG/5ML PO SUSP: 30.0000 mL | ORAL | Status: DC | PRN

## 2015-04-21 MED ORDER — ACETAMINOPHEN 650 MG RE SUPP: 650.0000 mg | Freq: Four times a day (QID) | RECTAL | Status: DC | PRN

## 2015-04-21 MED ORDER — FENTANYL CITRATE (PF) 100 MCG/2ML IJ SOLN
INTRAMUSCULAR | Status: AC
  Administered 2015-04-21: 50 ug
  Filled 2015-04-21: qty 2

## 2015-04-21 MED ORDER — PHENOL 1.4 % MT LIQD: 1.0000 | OROMUCOSAL | Status: DC | PRN

## 2015-04-21 MED ORDER — ATORVASTATIN CALCIUM 20 MG PO TABS
20.0000 mg | ORAL_TABLET | Freq: Every day | ORAL | Status: DC
  Administered 2015-04-21 – 2015-04-22 (×2): 20 mg via ORAL
  Filled 2015-04-21 (×2): qty 1

## 2015-04-21 MED ORDER — EPHEDRINE SULFATE 50 MG/ML IJ SOLN
INTRAMUSCULAR | Status: AC
  Filled 2015-04-21: qty 1

## 2015-04-21 MED ORDER — PROPOFOL 500 MG/50ML IV EMUL
INTRAVENOUS | Status: DC | PRN
  Administered 2015-04-21: 50 ug/kg/min via INTRAVENOUS

## 2015-04-21 MED ORDER — DIPHENHYDRAMINE HCL 12.5 MG/5ML PO ELIX: 12.5000 mg | ORAL_SOLUTION | ORAL | Status: DC | PRN

## 2015-04-21 MED ORDER — DOCUSATE SODIUM 100 MG PO CAPS
100.0000 mg | ORAL_CAPSULE | Freq: Two times a day (BID) | ORAL | Status: DC
  Filled 2015-04-21: qty 1

## 2015-04-21 MED ORDER — DEXAMETHASONE SODIUM PHOSPHATE 10 MG/ML IJ SOLN
INTRAMUSCULAR | Status: DC | PRN
  Administered 2015-04-21: 10 mg via INTRAVENOUS

## 2015-04-21 MED ORDER — METOPROLOL SUCCINATE ER 100 MG PO TB24
100.0000 mg | ORAL_TABLET | Freq: Every day | ORAL | Status: DC
  Administered 2015-04-22: 100 mg via ORAL
  Filled 2015-04-21: qty 1

## 2015-04-21 MED ORDER — SODIUM CHLORIDE 0.9 % IR SOLN
Status: DC | PRN
Start: 2015-04-21 — End: 2015-04-21
  Administered 2015-04-21: 3000 mL

## 2015-04-21 MED ORDER — ONDANSETRON HCL 4 MG/2ML IJ SOLN: 4.0000 mg | Freq: Once | INTRAMUSCULAR | Status: DC | PRN

## 2015-04-21 MED ORDER — FELODIPINE ER 10 MG PO TB24: 10.0000 mg | ORAL_TABLET | Freq: Every day | ORAL | Status: DC

## 2015-04-21 MED ORDER — METOCLOPRAMIDE HCL 5 MG/ML IJ SOLN: 5.0000 mg | Freq: Three times a day (TID) | INTRAMUSCULAR | Status: DC | PRN

## 2015-04-21 MED ORDER — METOCLOPRAMIDE HCL 5 MG PO TABS
5.0000 mg | ORAL_TABLET | Freq: Three times a day (TID) | ORAL | Status: DC | PRN
  Administered 2015-04-22: 10 mg via ORAL
  Filled 2015-04-21: qty 2

## 2015-04-21 MED ORDER — ONDANSETRON HCL 4 MG/2ML IJ SOLN
INTRAMUSCULAR | Status: AC
  Filled 2015-04-21: qty 2

## 2015-04-21 MED ORDER — ENOXAPARIN SODIUM 30 MG/0.3ML ~~LOC~~ SOLN
30.0000 mg | Freq: Two times a day (BID) | SUBCUTANEOUS | Status: DC
  Administered 2015-04-22: 30 mg via SUBCUTANEOUS
  Filled 2015-04-21: qty 0.3

## 2015-04-21 MED ORDER — PROPOFOL 10 MG/ML IV BOLUS
INTRAVENOUS | Status: AC
  Filled 2015-04-21: qty 20

## 2015-04-21 MED ORDER — FENTANYL CITRATE (PF) 250 MCG/5ML IJ SOLN
INTRAMUSCULAR | Status: AC
  Filled 2015-04-21: qty 5

## 2015-04-21 MED ORDER — BUPROPION HCL ER (XL) 150 MG PO TB24
300.0000 mg | ORAL_TABLET | Freq: Every morning | ORAL | Status: DC
  Administered 2015-04-22: 300 mg via ORAL
  Filled 2015-04-21: qty 2

## 2015-04-21 MED ORDER — EPHEDRINE SULFATE 50 MG/ML IJ SOLN
INTRAMUSCULAR | Status: DC | PRN
  Administered 2015-04-21: 5 mg via INTRAVENOUS
  Administered 2015-04-21 (×3): 10 mg via INTRAVENOUS
  Administered 2015-04-21: 5 mg via INTRAVENOUS

## 2015-04-21 MED ORDER — OXYCODONE HCL 5 MG PO TABS
5.0000 mg | ORAL_TABLET | ORAL | Status: DC | PRN
  Administered 2015-04-21: 10 mg via ORAL
  Administered 2015-04-22 (×3): 5 mg via ORAL
  Filled 2015-04-21: qty 2
  Filled 2015-04-21: qty 1
  Filled 2015-04-21: qty 3
  Filled 2015-04-21 (×2): qty 1

## 2015-04-21 MED ORDER — CEFUROXIME SODIUM 1.5 G IJ SOLR
INTRAMUSCULAR | Status: AC
  Filled 2015-04-21: qty 1.5

## 2015-04-21 MED ORDER — LOSARTAN POTASSIUM 50 MG PO TABS: 100.0000 mg | ORAL_TABLET | Freq: Every day | ORAL | Status: DC

## 2015-04-21 MED ORDER — LACTATED RINGERS IV SOLN
INTRAVENOUS | Status: DC
  Administered 2015-04-21 (×3): via INTRAVENOUS

## 2015-04-21 MED ORDER — MIDAZOLAM HCL 2 MG/2ML IJ SOLN
INTRAMUSCULAR | Status: AC
  Administered 2015-04-21: 1 mg
  Filled 2015-04-21: qty 2

## 2015-04-21 MED ORDER — FENTANYL CITRATE (PF) 250 MCG/5ML IJ SOLN
INTRAMUSCULAR | Status: DC | PRN
  Administered 2015-04-21: 50 ug via INTRAVENOUS

## 2015-04-21 MED ORDER — ONDANSETRON HCL 4 MG/2ML IJ SOLN
4.0000 mg | Freq: Four times a day (QID) | INTRAMUSCULAR | Status: DC | PRN
  Administered 2015-04-21: 4 mg via INTRAVENOUS
  Filled 2015-04-21: qty 2

## 2015-04-21 MED ORDER — CEFAZOLIN SODIUM-DEXTROSE 2-3 GM-% IV SOLR
2.0000 g | Freq: Four times a day (QID) | INTRAVENOUS | Status: AC
  Administered 2015-04-21 – 2015-04-22 (×2): 2 g via INTRAVENOUS
  Filled 2015-04-21 (×2): qty 50

## 2015-04-21 MED ORDER — FENTANYL CITRATE (PF) 100 MCG/2ML IJ SOLN: 25.0000 ug | INTRAMUSCULAR | Status: DC | PRN

## 2015-04-21 MED ORDER — SODIUM CHLORIDE 0.9 % IV SOLN
INTRAVENOUS | Status: DC
  Administered 2015-04-21 – 2015-04-22 (×2): via INTRAVENOUS

## 2015-04-21 MED ORDER — POLYETHYLENE GLYCOL 3350 17 G PO PACK
17.0000 g | PACK | Freq: Two times a day (BID) | ORAL | Status: DC
  Filled 2015-04-21: qty 1

## 2015-04-21 MED ORDER — POTASSIUM CHLORIDE CRYS ER 20 MEQ PO TBCR
40.0000 meq | EXTENDED_RELEASE_TABLET | Freq: Every day | ORAL | Status: DC
  Administered 2015-04-21 – 2015-04-22 (×2): 40 meq via ORAL
  Filled 2015-04-21 (×2): qty 2

## 2015-04-21 MED ORDER — BUPIVACAINE IN DEXTROSE 0.75-8.25 % IT SOLN
INTRATHECAL | Status: DC | PRN
  Administered 2015-04-21: 2 mL via INTRATHECAL

## 2015-04-21 MED ORDER — BUPIVACAINE-EPINEPHRINE 0.25% -1:200000 IJ SOLN
INTRAMUSCULAR | Status: DC | PRN
Start: 2015-04-21 — End: 2015-04-21
  Administered 2015-04-21: 30 mL

## 2015-04-21 MED ORDER — HYDROMORPHONE HCL 1 MG/ML IJ SOLN: 0.2500 mg | INTRAMUSCULAR | Status: DC | PRN

## 2015-04-21 MED ORDER — PHENYLEPHRINE HCL 10 MG/ML IJ SOLN
INTRAMUSCULAR | Status: DC | PRN
  Administered 2015-04-21: 80 ug via INTRAVENOUS

## 2015-04-21 MED ORDER — ACETAMINOPHEN 325 MG PO TABS: 650.0000 mg | ORAL_TABLET | Freq: Four times a day (QID) | ORAL | Status: DC | PRN

## 2015-04-21 MED ORDER — MENTHOL 3 MG MT LOZG: 1.0000 | LOZENGE | OROMUCOSAL | Status: DC | PRN

## 2015-04-21 SURGICAL SUPPLY — 75 items
APL SKNCLS STERI-STRIP NONHPOA (GAUZE/BANDAGES/DRESSINGS)
BANDAGE ESMARK 6X9 LF (GAUZE/BANDAGES/DRESSINGS) ×1 IMPLANT
BENZOIN TINCTURE PRP APPL 2/3 (GAUZE/BANDAGES/DRESSINGS) ×1 IMPLANT
BLADE SAGITTAL 25.0X1.19X90 (BLADE) ×2 IMPLANT
BLADE SAGITTAL 25.0X1.19X90MM (BLADE) ×1
BLADE SAW RECIP 87.9 MT (BLADE) ×2 IMPLANT
BLADE SAW SAG 90X13X1.27 (BLADE) ×3 IMPLANT
BLADE SURG 10 STRL SS (BLADE) ×4 IMPLANT
BNDG CMPR 9X6 STRL LF SNTH (GAUZE/BANDAGES/DRESSINGS) ×1
BNDG CMPR MED 15X6 ELC VLCR LF (GAUZE/BANDAGES/DRESSINGS) ×1
BNDG ELASTIC 6X15 VLCR STRL LF (GAUZE/BANDAGES/DRESSINGS) ×3 IMPLANT
BNDG ESMARK 6X9 LF (GAUZE/BANDAGES/DRESSINGS) ×3
BOWL SMART MIX CTS (DISPOSABLE) ×3 IMPLANT
CAPT KNEE TOTAL 3 ATTUNE ×2 IMPLANT
CEMENT HV SMART SET (Cement) ×6 IMPLANT
CLOSURE WOUND 1/2 X4 (GAUZE/BANDAGES/DRESSINGS) ×1
COVER SURGICAL LIGHT HANDLE (MISCELLANEOUS) ×3 IMPLANT
CUFF TOURNIQUET SINGLE 34IN LL (TOURNIQUET CUFF) ×3 IMPLANT
CUFF TOURNIQUET SINGLE 44IN (TOURNIQUET CUFF) IMPLANT
DECANTER SPIKE VIAL GLASS SM (MISCELLANEOUS) ×1 IMPLANT
DRAPE EXTREMITY T 121X128X90 (DRAPE) ×3 IMPLANT
DRAPE INCISE IOBAN 66X45 STRL (DRAPES) ×1 IMPLANT
DRAPE PROXIMA HALF (DRAPES) ×3 IMPLANT
DRAPE U-SHAPE 47X51 STRL (DRAPES) ×3 IMPLANT
DRSG AQUACEL AG ADV 3.5X10 (GAUZE/BANDAGES/DRESSINGS) ×3 IMPLANT
DRSG AQUACEL AG ADV 3.5X14 (GAUZE/BANDAGES/DRESSINGS) ×1 IMPLANT
DURAPREP 26ML APPLICATOR (WOUND CARE) ×6 IMPLANT
ELECT CAUTERY BLADE 6.4 (BLADE) ×3 IMPLANT
ELECT REM PT RETURN 9FT ADLT (ELECTROSURGICAL) ×3
ELECTRODE REM PT RTRN 9FT ADLT (ELECTROSURGICAL) ×1 IMPLANT
FACESHIELD WRAPAROUND (MASK) ×3 IMPLANT
FACESHIELD WRAPAROUND OR TEAM (MASK) ×1 IMPLANT
GAUZE SPONGE 4X4 12PLY STRL (GAUZE/BANDAGES/DRESSINGS) ×1 IMPLANT
GLOVE BIO SURGEON STRL SZ7 (GLOVE) ×3 IMPLANT
GLOVE BIOGEL PI IND STRL 7.0 (GLOVE) ×1 IMPLANT
GLOVE BIOGEL PI IND STRL 7.5 (GLOVE) ×1 IMPLANT
GLOVE BIOGEL PI INDICATOR 7.0 (GLOVE) ×4
GLOVE BIOGEL PI INDICATOR 7.5 (GLOVE) ×4
GLOVE SS BIOGEL STRL SZ 7.5 (GLOVE) ×1 IMPLANT
GLOVE SUPERSENSE BIOGEL SZ 7.5 (GLOVE) ×2
GOWN STRL REUS W/ TWL LRG LVL3 (GOWN DISPOSABLE) ×1 IMPLANT
GOWN STRL REUS W/ TWL XL LVL3 (GOWN DISPOSABLE) ×2 IMPLANT
GOWN STRL REUS W/TWL LRG LVL3 (GOWN DISPOSABLE) ×3
GOWN STRL REUS W/TWL XL LVL3 (GOWN DISPOSABLE) ×6
HANDPIECE INTERPULSE COAX TIP (DISPOSABLE) ×3
HOOD PEEL AWAY FACE SHEILD DIS (HOOD) ×6 IMPLANT
IMMOBILIZER KNEE 22 UNIV (SOFTGOODS) ×3 IMPLANT
KIT BASIN OR (CUSTOM PROCEDURE TRAY) ×3 IMPLANT
KIT ROOM TURNOVER OR (KITS) ×3 IMPLANT
MANIFOLD NEPTUNE II (INSTRUMENTS) ×3 IMPLANT
MARKER SKIN DUAL TIP RULER LAB (MISCELLANEOUS) ×3 IMPLANT
NDL 18GX1X1/2 (RX/OR ONLY) (NEEDLE) ×1 IMPLANT
NEEDLE 18GX1X1/2 (RX/OR ONLY) (NEEDLE) ×3 IMPLANT
NS IRRIG 1000ML POUR BTL (IV SOLUTION) ×3 IMPLANT
PACK TOTAL JOINT (CUSTOM PROCEDURE TRAY) ×3 IMPLANT
PACK UNIVERSAL I (CUSTOM PROCEDURE TRAY) ×3 IMPLANT
PAD ARMBOARD 7.5X6 YLW CONV (MISCELLANEOUS) ×6 IMPLANT
SET HNDPC FAN SPRY TIP SCT (DISPOSABLE) ×1 IMPLANT
STRIP CLOSURE SKIN 1/2X4 (GAUZE/BANDAGES/DRESSINGS) ×2 IMPLANT
SUCTION FRAZIER TIP 10 FR DISP (SUCTIONS) ×3 IMPLANT
SUT MNCRL AB 3-0 PS2 18 (SUTURE) ×3 IMPLANT
SUT VIC AB 0 CT1 27 (SUTURE) ×6
SUT VIC AB 0 CT1 27XBRD ANBCTR (SUTURE) ×2 IMPLANT
SUT VIC AB 1 CT1 27 (SUTURE) ×3
SUT VIC AB 1 CT1 27XBRD ANBCTR (SUTURE) ×1 IMPLANT
SUT VIC AB 2-0 CT1 27 (SUTURE) ×6
SUT VIC AB 2-0 CT1 TAPERPNT 27 (SUTURE) ×2 IMPLANT
SYR 30ML SLIP (SYRINGE) ×3 IMPLANT
TAPE STRIPS DRAPE STRL (GAUZE/BANDAGES/DRESSINGS) ×2 IMPLANT
TOWEL OR 17X24 6PK STRL BLUE (TOWEL DISPOSABLE) ×3 IMPLANT
TOWEL OR 17X26 10 PK STRL BLUE (TOWEL DISPOSABLE) ×3 IMPLANT
TRAY FOLEY CATH 16FR SILVER (SET/KITS/TRAYS/PACK) ×3 IMPLANT
TUBE CONNECTING 12'X1/4 (SUCTIONS)
TUBE CONNECTING 12X1/4 (SUCTIONS) ×1 IMPLANT
YANKAUER SUCT BULB TIP NO VENT (SUCTIONS) ×3 IMPLANT

## 2015-04-21 NOTE — Progress Notes (Signed)
Orthopedic Tech Progress Note Patient Details:  George [name]. 06-22-1945 [id number]  CPM Left Knee CPM Left Knee: On Left Knee Flexion (Degrees): 60 Left Knee Extension (Degrees): 0 Additional Comments: Trapeze bar and foot roll   [place] Pink 04/21/2015, 2:00 PM

## 2015-04-21 NOTE — Anesthesia Procedure Notes (Addendum)
Anesthesia Regional Block:  Adductor canal block  Pre-Anesthetic Checklist: ,, timeout performed, Correct Patient, Correct Site, Correct Laterality, Correct Procedure, Correct Position, site marked, Risks and benefits discussed,  Surgical consent,  Pre-op evaluation,  At surgeon's request and post-op pain management  Laterality: Left  Prep: chloraprep       Needles:  Injection technique: Single-shot  Needle Type: Echogenic Needle     Needle Length: 9cm 9 cm Needle Gauge: 21 and 21 G    Additional Needles:  Procedures: ultrasound guided (picture in chart) Adductor canal block Narrative:  Start time: 04/21/2015 10:00 AM End time: 04/21/2015 10:04 AM Injection made incrementally with aspirations every 5 mL.  Performed by: Personally  Anesthesiologist: Catalina Gravel  Additional Notes: No pain on injection. No increased resistance to injection. Injection made in 5cc increments.  Good needle visualization.  Patient tolerated procedure well.   Spinal Patient location during procedure: OR Start time: 04/21/2015 11:04 AM End time: 04/21/2015 11:06 AM Staffing Anesthesiologist: Catalina Gravel Performed by: anesthesiologist  Preanesthetic Checklist Completed: patient identified, surgical consent, pre-op evaluation, timeout performed, IV checked, risks and benefits discussed and monitors and equipment checked Spinal Block Patient position: sitting Prep: site prepped and draped and DuraPrep Patient monitoring: continuous pulse ox and blood pressure Approach: midline Location: L3-4 Needle Needle type: Pencan  Needle gauge: 24 G Assessment Sensory level: T6 Additional Notes Functioning IV was confirmed and monitors were applied. Sterile prep and drape, including hand hygiene, mask and sterile gloves were used. The patient was positioned and the spine was prepped. The skin was anesthetized with lidocaine.  Free flow of clear CSF was obtained prior to injecting local  anesthetic into the CSF.  The spinal needle aspirated freely following injection.  The needle was carefully withdrawn.  The patient tolerated the procedure well. Consent was obtained prior to procedure with all questions answered and concerns addressed. Risks including but not limited to bleeding, infection, nerve damage, paralysis, failed block, inadequate analgesia, allergic reaction, high spinal, itching and headache were discussed and the patient wished to proceed.   Hoy Morn, MD

## 2015-04-21 NOTE — Transfer of Care (Signed)
Immediate Anesthesia Transfer of Care Note  Patient: George [name].  Procedure(s) Performed: Procedure(s): LEFT TOTAL KNEE ARTHROPLASTY (Left)  Patient Location: PACU  Anesthesia Type:Spinal  Level of Consciousness: awake, alert  and oriented  Airway & Oxygen Therapy: Patient Spontanous Breathing and Patient connected to nasal cannula oxygen  Post-op Assessment: Report given to RN, Post -op Vital signs reviewed and stable and Patient moving all extremities X 4  Post vital signs: Reviewed and stable  Last Vitals:  Filed Vitals:   04/21/15 1050 04/21/15 1055  BP: 130/80   Pulse: 63 64  Temp:    Resp: 10 16    Complications: No apparent anesthesia complications

## 2015-04-21 NOTE — Op Note (Signed)
MRN:     [id number] DOB/AGE:    December 20, 1945 / 70 y.o.       OPERATIVE REPORT    DATE OF PROCEDURE:  04/21/2015       PREOPERATIVE DIAGNOSIS:   PRIMARY LOCALIZED OA LEFT KNEE      Estimated body mass index is 25.9 kg/(m^2) as calculated from the following:   Height as of this encounter: 6' (1.829 m).   Weight as of this encounter: 86.637 kg (191 lb).                                                        POSTOPERATIVE DIAGNOSIS:   same                                                                       PROCEDURE:  Procedure(s): LEFT TOTAL KNEE ARTHROPLASTY Using Depuy Attune RP implants #8 Femur, #8Tibia, 57mm  RP bearing, 38 Patella     SURGEON: Emslee Lopezmartinez A    ASSISTANT:  Kirstin Shepperson PA-C   (Present and scrubbed throughout the case, critical for assistance with exposure, retraction, instrumentation, and closure.)         ANESTHESIA: Spinal with Adductor Nerve Block     TOURNIQUET TIME: [id number]   COMPLICATIONS:  None     SPECIMENS: None   INDICATIONS FOR PROCEDURE: The patient has  DJD LEFT KNEE, varus deformities, XR shows bone on bone arthritis. Patient has failed all conservative measures including anti-inflammatory medicines, narcotics, attempts at  exercise and weight loss, cortisone injections and viscosupplementation.  Risks and benefits of surgery have been discussed, questions answered.   DESCRIPTION OF PROCEDURE: [name], received  right femoral nerve block and IV antibiotics, in the holding area at [facility]. Patient taken to the operating room, appropriate anesthetic  monitors were attached General endotracheal anesthesia induced with  the patient in supine position, Foley catheter was inserted. Tourniquet  applied high to the operative thigh. Lateral post and foot positioner  applied to the table, the lower extremity was then prepped and draped  in usual sterile fashion from the ankle to the tourniquet. Time-out procedure  was performed. The limb was wrapped with an Esmarch bandage and the tourniquet inflated to 365 mmHg. We began the operation by making the anterior midline incision starting at handbreadth above the patella going over the patella 1 cm medial to and  4 cm distal to the tibial tubercle. Small bleeders in the skin and the  subcutaneous tissue identified and cauterized. Transverse retinaculum was incised and reflected medially and a medial parapatellar arthrotomy was accomplished. the patella was everted and theprepatellar fat pad resected. The superficial medial collateral  ligament was then elevated from anterior to posterior along the proximal  flare of the tibia and anterior half of the menisci resected. The knee was hyperflexed exposing bone on bone arthritis. Peripheral and notch osteophytes as well as the cruciate ligaments were then resected. We continued to  work our way around posteriorly along the proximal tibia, and externally  rotated the tibia subluxing  it out from underneath the femur. A McHale  retractor was placed through the notch and a lateral Hohmann retractor  placed, and we then drilled through the proximal tibia in line with the  axis of the tibia followed by an intramedullary guide rod and 2-degree  posterior slope cutting guide. The tibial cutting guide was pinned into place  allowing resection of 4 mm of bone medially and about 6 mm of bone  laterally because of her varus deformity. Satisfied with the tibial resection, we then  entered the distal femur 2 mm anterior to the PCL origin with the  intramedullary guide rod and applied the distal femoral cutting guide  set at 35mm, with 5 degrees of valgus. This was pinned along the  epicondylar axis. At this point, the distal femoral cut was accomplished without difficulty. We then sized for a #8 femoral component and pinned the guide in 3 degrees of external rotation.The chamfer cutting guide was pinned into place. The anterior,  posterior, and chamfer cuts were accomplished without difficulty followed by  the  RP box cutting guide and the box cut. We also removed posterior osteophytes from the posterior femoral condyles. At this  time, the knee was brought into full extension. We checked our  extension and flexion gaps and found them symmetric at 92mm.  The patella thickness measured at 25 mm. We set the cutting guide at 15 and removed the posterior 9.5-10 mm  of the patella sized for 38 button and drilled the lollipop. The knee  was then once again hyperflexed exposing the proximal tibia. We sized for a #8 tibial base plate, applied the smokestack and the conical reamer followed by the the Delta fin keel punch. We then hammered into place the  RP trial femoral component, inserted a 1 trial bearing, trial patellar button, and took the knee through range of motion from 0-130 degrees. No thumb pressure was required for patellar  tracking. At this point, all trial components were removed, a double batch of DePuy HV cement  was mixed and applied to all bony metallic mating surfaces except for the posterior condyles of the femur itself. In order, we  hammered into place the tibial tray and removed excess cement, the femoral component and removed excess cement, a 81mm  RP bearing  was inserted, and the knee brought to full extension with compression.  The patellar button was clamped into place, and excess cement  removed. While the cement cured the wound was irrigated out with normal saline solution pulse lavage.. Ligament stability and patellar tracking were checked and found to be excellent.. The parapatellar arthrotomy was closed with  #1 Vicryl suture. The subcutaneous tissue with 0 and 2-0 undyed  Vicryl suture, and 4-0 Monocryl.. A dressing of Aquaseal,  4 x 4, dressing sponges, Webril, and Ace wrap applied. Needle and sponge count were correct times 2.The patient awakened, extubated, and taken to recovery room without  difficulty. Vascular status was normal, pulses 2+ and symmetric.   Shaneca Orne A 04/21/2015, 12:41 PM

## 2015-04-21 NOTE — Anesthesia Postprocedure Evaluation (Signed)
Anesthesia Post Note  Patient: George [name].  Procedure(s) Performed: Procedure(s) (LRB): LEFT TOTAL KNEE ARTHROPLASTY (Left)  Patient location during evaluation: PACU Anesthesia Type: Spinal and MAC Level of consciousness: awake, sedated and patient cooperative Pain management: pain level controlled Vital Signs Assessment: post-procedure vital signs reviewed and stable Respiratory status: spontaneous breathing and respiratory function stable Cardiovascular status: blood pressure returned to baseline and stable Anesthetic complications: no    Last Vitals:  Filed Vitals:   04/21/15 1325 04/21/15 1340  BP: 102/66 100/71  Pulse: 58 57  Temp:    Resp: 13 13    Last Pain: There were no vitals filed for this visit.               Zorian Gunderman EDWARD

## 2015-04-21 NOTE — Interval H&P Note (Signed)
History and Physical Interval Note:  04/21/2015 10:47 AM  George [name].  has presented today for surgery, with the diagnosis of PRIMARY LOCALIZED OA LEFT KNEE  The various methods of treatment have been discussed with the patient and family. After consideration of risks, benefits and other options for treatment, the patient has consented to  Procedure(s): LEFT TOTAL KNEE ARTHROPLASTY (Left) as a surgical intervention .  The patient's history has been reviewed, patient examined, no change in status, stable for surgery.  I have reviewed the patient's chart and labs.  Questions were answered to the patient's satisfaction.     Elsie Saas A

## 2015-04-21 NOTE — Progress Notes (Signed)
Utilization review completed.  

## 2015-04-22 ENCOUNTER — Encounter (HOSPITAL_COMMUNITY): Payer: Self-pay | Admitting: Orthopedic Surgery

## 2015-04-22 LAB — BASIC METABOLIC PANEL
ANION GAP: 8 (ref 5–15)
BUN: 13 mg/dL (ref 6–20)
CHLORIDE: 108 mmol/L (ref 101–111)
CO2: 24 mmol/L (ref 22–32)
Calcium: 8.5 mg/dL — ABNORMAL LOW (ref 8.9–10.3)
Creatinine, Ser: 1.25 mg/dL — ABNORMAL HIGH (ref 0.61–1.24)
GFR, EST NON AFRICAN AMERICAN: 56 mL/min — AB (ref >60)
GLUCOSE: 168 mg/dL — AB (ref 65–99)
POTASSIUM: 3.8 mmol/L (ref 3.5–5.1)
SODIUM: 140 mmol/L (ref 135–145)

## 2015-04-22 LAB — CBC
HCT: 31.6 % — ABNORMAL LOW (ref 39.0–52.0)
HEMOGLOBIN: 10.2 g/dL — AB (ref 13.0–17.0)
MCH: 28.1 pg (ref 26.0–34.0)
MCHC: 32.3 g/dL (ref 30.0–36.0)
MCV: 87.1 fL (ref 78.0–100.0)
Platelets: 239 10*3/uL (ref 150–400)
RBC: 3.63 MIL/uL — AB (ref 4.22–5.81)
RDW: 13.6 % (ref 11.5–15.5)
WBC: 13.4 10*3/uL — AB (ref 4.0–10.5)

## 2015-04-22 MED ORDER — HYDROMORPHONE HCL 2 MG PO TABS
2.0000 mg | ORAL_TABLET | ORAL | Status: DC | PRN
  Administered 2015-04-22: 4 mg via ORAL
  Administered 2015-04-22 (×2): 2 mg via ORAL
  Administered 2015-04-22: 4 mg via ORAL
  Filled 2015-04-22: qty 2
  Filled 2015-04-22: qty 1
  Filled 2015-04-22 (×2): qty 2
  Filled 2015-04-22: qty 1

## 2015-04-22 MED ORDER — ENOXAPARIN SODIUM 30 MG/0.3ML ~~LOC~~ SOLN: 30.0000 mg | Freq: Two times a day (BID) | SUBCUTANEOUS | Status: DC

## 2015-04-22 MED ORDER — TAMSULOSIN HCL 0.4 MG PO CAPS: 0.4000 mg | ORAL_CAPSULE | Freq: Every day | ORAL | Status: DC

## 2015-04-22 MED ORDER — DOCUSATE SODIUM 100 MG PO CAPS: ORAL_CAPSULE | ORAL | Status: DC

## 2015-04-22 MED ORDER — ONDANSETRON HCL 4 MG PO TABS: 4.0000 mg | ORAL_TABLET | Freq: Four times a day (QID) | ORAL | Status: DC | PRN

## 2015-04-22 MED ORDER — HYDROMORPHONE HCL 2 MG PO TABS: ORAL_TABLET | ORAL | Status: DC

## 2015-04-22 MED ORDER — TAMSULOSIN HCL 0.4 MG PO CAPS
0.4000 mg | ORAL_CAPSULE | Freq: Every day | ORAL | Status: DC
  Administered 2015-04-22: 0.4 mg via ORAL
  Filled 2015-04-22: qty 1

## 2015-04-22 MED ORDER — POLYETHYLENE GLYCOL 3350 17 G PO PACK: PACK | ORAL | Status: DC

## 2015-04-22 MED ORDER — METOCLOPRAMIDE HCL 5 MG PO TABS: 5.0000 mg | ORAL_TABLET | Freq: Three times a day (TID) | ORAL | Status: DC | PRN

## 2015-04-22 NOTE — Progress Notes (Signed)
Occupational Therapy Evaluation/Discharge Patient Details Name: George [name]. MRN: [id number] DOB: 01-Jan-1946 Today's Date: 04/22/2015    History of Present Illness Pt admitted for L TKA. PMHx: anxiety, HTN, colon CA   Clinical Impression   PTA, pt was independent with all ADLs and functional mobility. Pt currently presents with acute L knee pain and required supervision for ADLs and mobility. Educated pt on proper step sequence for shower transfer, compensatory strategies for LB ADLs, fall prevention strategies, and pain management strategies. All education has been completed and the pt has no further questions. Pt with no further acute OT needs. OT signing off. Thank you for this referral.    Follow Up Recommendations  No OT follow up;Supervision - Intermittent    Equipment Recommendations  3 in 1 bedside comode;Other (comment) (RW-2 wheeled)    Recommendations for Other Services       Precautions / Restrictions Precautions Precautions: Knee Precaution Booklet Issued: No Precaution Comments: Reviewed precautions Restrictions Weight Bearing Restrictions: Yes LLE Weight Bearing: Weight bearing as tolerated      Mobility Bed Mobility Overal bed mobility: Modified Independent             General bed mobility comments: Pt up in chair on OT arrival  Transfers Overall transfer level: Needs assistance Equipment used: Rolling walker (2 wheeled) Transfers: Sit to/from Stand Sit to Stand: Supervision         General transfer comment: Cues for safe hand placement and proper RW use    Balance Overall balance assessment: Needs assistance Sitting-balance support: No upper extremity supported;Feet supported Sitting balance-Leahy Scale: Good     Standing balance support: No upper extremity supported;During functional activity Standing balance-Leahy Scale: Fair Standing balance comment: Able to complete grooming tasks at sink with no UE support for ~ 5 minutes                             ADL Overall ADL's : Needs assistance/impaired     Grooming: Wash/dry hands;Wash/dry face;Oral care;Supervision/safety;Standing           Upper Body Dressing : Supervision/safety;Sitting   Lower Body Dressing: Minimal assistance;Sitting/lateral leans Lower Body Dressing Details (indicate cue type and reason): to don/doff socks Toilet Transfer: Supervision/safety;Ambulation;Comfort height toilet;RW;Grab bars Toilet Transfer Details (indicate cue type and reason): Grab bar on R side to simulate sink vanity at home Toileting- Clothing Manipulation and Hygiene: Supervision/safety;Sit to/from stand   Tub/ Shower Transfer: Walk-in shower;Supervision/safety;Cueing for sequencing;Ambulation;Rolling walker Tub/Shower Transfer Details (indicate cue type and reason): Cues for proper step sequence Functional mobility during ADLs: Supervision/safety;Rolling walker General ADL Comments: Supervision for functional transfers and mobility - pt moves quickly and can be impulsive at times. Discussed slowing down, safety and fall prevention strategies. Pt able to reach R foot for LB ADLs and required min assist for LLE (pt will have friend at home 24/7 to assist with these tasks until able to do independently).      Vision Vision Assessment?: No apparent visual deficits   Perception     Praxis      Pertinent Vitals/Pain Pain Assessment: 0-10 Pain Score: 5  Pain Location: L knee Pain Descriptors / Indicators: Aching;Sore Pain Intervention(s): Limited activity within patient's tolerance;Premedicated before session;Monitored during session;Repositioned;Ice applied     Hand Dominance Right   Extremity/Trunk Assessment Upper Extremity Assessment Upper Extremity Assessment: Overall WFL for tasks assessed   Lower Extremity Assessment Lower Extremity Assessment: LLE deficits/detail LLE Deficits / Details:  decreased ROM secondary to post op pain   Cervical /  Trunk Assessment Cervical / Trunk Assessment: Normal   Communication Communication Communication: No difficulties   Cognition Arousal/Alertness: Awake/alert Behavior During Therapy: WFL for tasks assessed/performed Overall Cognitive Status: Within Functional Limits for tasks assessed                     General Comments       Exercises       Shoulder Instructions      Home Living Family/patient expects to be discharged to:: Private residence Living Arrangements: Non-relatives/Friends Available Help at Discharge: Friend(s);Available 24 hours/day Type of Home: House Home Access: Level entry     Home Layout: One level     Bathroom Shower/Tub: Walk-in Hydrologist: Handicapped height Bathroom Accessibility: Yes How Accessible: Accessible via walker Home Equipment: Hand held shower head          Prior Functioning/Environment Level of Independence: Independent             OT Diagnosis: Acute pain   OT Problem List: Decreased strength;Decreased range of motion;Decreased activity tolerance;Impaired balance (sitting and/or standing);Decreased coordination;Decreased safety awareness;Decreased knowledge of use of DME or AE;Decreased knowledge of precautions;Pain   OT Treatment/Interventions:      OT Goals(Current goals can be found in the care plan section) Acute Rehab OT Goals Patient Stated Goal: to go home OT Goal Formulation: With patient Time For Goal Achievement: 05/06/15 Potential to Achieve Goals: Good  OT Frequency:     Barriers to D/C:            Co-evaluation              End of Session Equipment Utilized During Treatment: Gait belt;Rolling walker CPM Left Knee CPM Left Knee: Off Additional Comments: Zero degree bone foam applied; ice applied Nurse Communication: Mobility status;Precautions  Activity Tolerance: Patient tolerated treatment well Patient left: in chair;with call bell/phone within reach;with  family/visitor present   Time: [id number] OT Time Calculation (min): 36 min Charges:  OT General Charges $OT Visit: 1 Procedure OT Evaluation $OT Eval Moderate Complexity: 1 Procedure OT Treatments $Self Care/Home Management : 8-22 mins G-Codes:    Redmond Baseman, OTR/L [phone] 04/22/2015, 11:02 AM

## 2015-04-22 NOTE — Evaluation (Signed)
Physical Therapy Evaluation Patient Details Name: George [name]. MRN: [id number] DOB: 07-Aug-1945 Today's Date: 04/22/2015   History of Present Illness  Pt admitted for L TKA. PMHx: anxiety, HTN, colon CA  Clinical Impression  Pt very pleasant, moving well and able to ambulate 300' on first trial. Pt with good quad strength and knee extension. Pt educated for HEP, bone foam, CPM use, transfers, gait and plan. Pt eager to progress all activity and should progress exceptionally well. Pt will benefit from acute therapy to maximize mobility, function, strength and ROM to decrease burden of care.     Follow Up Recommendations Home health PT    Equipment Recommendations  Rolling walker with 5" wheels;3in1 (PT)    Recommendations for Other Services       Precautions / Restrictions Precautions Precautions: Knee Restrictions LLE Weight Bearing: Weight bearing as tolerated      Mobility  Bed Mobility Overal bed mobility: Modified Independent                Transfers Overall transfer level: Needs assistance   Transfers: Sit to/from Stand Sit to Stand: Supervision         General transfer comment: cues for hand placement and safety  Ambulation/Gait Ambulation/Gait assistance: Supervision Ambulation Distance (Feet): 300 Feet Assistive device: Rolling walker (2 wheeled) Gait Pattern/deviations: Step-through pattern;Decreased stride length;Decreased dorsiflexion - left   Gait velocity interpretation: Below normal speed for age/gender General Gait Details: cues for heel strike, posture and position in RW  Stairs            Wheelchair Mobility    Modified Rankin (Stroke Patients Only)       Balance                                             Pertinent Vitals/Pain Pain Assessment: 0-10 Pain Score: 2  Pain Location: left knee Pain Descriptors / Indicators: Aching Pain Intervention(s): Limited activity within patient's  tolerance;Monitored during session;Premedicated before session;Repositioned    Home Living Family/patient expects to be discharged to:: Private residence Living Arrangements: Spouse/significant other Available Help at Discharge: Friend(s);Available 24 hours/day Type of Home: House Home Access: Level entry     Home Layout: One level Home Equipment: None      Prior Function Level of Independence: Independent               Hand Dominance        Extremity/Trunk Assessment   Upper Extremity Assessment: Overall WFL for tasks assessed           Lower Extremity Assessment: LLE deficits/detail   LLE Deficits / Details: decreased ROM secondary to post op pain  Cervical / Trunk Assessment: Normal  Communication   Communication: No difficulties  Cognition Arousal/Alertness: Awake/alert Behavior During Therapy: WFL for tasks assessed/performed Overall Cognitive Status: Within Functional Limits for tasks assessed                      General Comments      Exercises Total Joint Exercises Quad Sets: AROM;Left;5 reps;Supine Heel Slides: AROM;Left;10 reps;Supine Hip ABduction/ADduction: AROM;Left;10 reps;Supine Straight Leg Raises: AROM;Left;10 reps;Supine Long Arc Quad: AROM;Left;Seated;10 reps      Assessment/Plan    PT Assessment Patient needs continued PT services  PT Diagnosis Acute pain;Difficulty walking   PT Problem List Decreased strength;Decreased range of motion;Decreased activity tolerance;Decreased mobility;Decreased  knowledge of use of DME;Pain  PT Treatment Interventions DME instruction;Gait training;Functional mobility training;Therapeutic activities;Therapeutic exercise;Patient/family education   PT Goals (Current goals can be found in the Care Plan section) Acute Rehab PT Goals Patient Stated Goal: return to jogging PT Goal Formulation: With patient Time For Goal Achievement: 04/29/15 Potential to Achieve Goals: Good    Frequency  7X/week   Barriers to discharge        Co-evaluation               End of Session   Activity Tolerance: Patient tolerated treatment well Patient left: in chair;with call bell/phone within reach;with nursing/sitter in room Nurse Communication: Mobility status;Weight bearing status         Time: 0712-0728 PT Time Calculation (min) (ACUTE ONLY): 16 min   Charges:   PT Evaluation $PT Eval Moderate Complexity: 1 Procedure     PT G CodesMelford Aase 04/22/2015, 7:33 AM  Elwyn Reach, [place]

## 2015-04-22 NOTE — Discharge Summary (Deleted)
Patient ID: George [name]. MRN: [id number] DOB/AGE: 07/07/1945 70 y.o.  Admit date: 04/21/2015 Discharge date: 04/22/2015  Admission Diagnoses:  Principal Problem:   Primary localized osteoarthritis of left knee Active Problems:   Malignant neoplasm of prostate (HCC)   Benign localized hyperplasia of prostate with urinary obstruction   Spermatocele   Generalized anxiety disorder   Panic attacks   DJD (degenerative joint disease) of knee   Discharge Diagnoses:  Same  Past Medical History  Diagnosis Date  . Elevated prostate specific antigen (PSA)   . Hematospermia   . Incomplete bladder emptying   . Nodular prostate with lower urinary tract symptoms   . Prostatitis   . Spermatocele   . Weak urinary stream   . Hx antineoplastic chemotherapy 2010  . Anxiety   . Depression   . Hypertension   . Malignant neoplasm of prostate ([place])   . Colon cancer ([place])   . 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 Generalized anxiety disorder 04/09/2015  . Panic attacks 04/09/2015  . Arthritis     OSTEO IN KNEE    Surgeries: Procedure(s): LEFT TOTAL KNEE ARTHROPLASTY on 04/21/2015   Consultants:    Discharged Condition: Improved  Hospital Course: George Borth. is an 69 y.o. male who was admitted 04/21/2015 for operative treatment ofPrimary localized osteoarthritis of left knee. Patient has severe unremitting pain that affects sleep, daily activities, and work/hobbies. After pre-op clearance the patient was taken to the operating room on 04/21/2015 and underwent  Procedure(s): LEFT TOTAL KNEE ARTHROPLASTY.    Patient was given perioperative antibiotics: Anti-infectives    Start     Dose/Rate Route Frequency Ordered Stop   04/21/15 1730  ceFAZolin (ANCEF) IVPB 2 g/50 mL premix     2 g 100 mL/hr over 30 Minutes Intravenous Every 6 hours 04/21/15 1717 04/22/15 0030   04/21/15 1030  ceFAZolin (ANCEF) IVPB 2 g/50 mL premix     2 g 100 mL/hr over 30  Minutes Intravenous To ShortStay Surgical 04/20/15 1146 04/21/15 1109       Patient was given sequential compression devices, early ambulation, and chemoprophylaxis to prevent DVT.  Patient benefited maximally from hospital stay and there were no complications.    Recent vital signs: Patient Vitals for the past 24 hrs:  BP Temp Temp src Pulse Resp SpO2  04/22/15 1518 (!) 153/88 mmHg 98.9 F (37.2 C) - 74 16 94 %  04/22/15 0646 (!) 158/91 mmHg 98.2 F (36.8 C) - 85 - 100 %  04/22/15 0322 (!) 156/88 mmHg 98.1 F (36.7 C) - 82 16 100 %  04/21/15 1935 (!) 154/87 mmHg 97.7 F (36.5 C) Oral 76 15 100 %  04/21/15 1715 (!) 140/56 mmHg 97.7 F (36.5 C) - 60 12 100 %     Recent laboratory studies:  Recent Labs  04/21/15 1821 04/22/15 0448  WBC 11.8* 13.4*  HGB 10.7* 10.2*  HCT 33.5* 31.6*  PLT 218 239  NA  --  140  K  --  3.8  CL  --  108  CO2  --  24  BUN  --  13  CREATININE 0.93 1.25*  GLUCOSE  --  168*  CALCIUM  --  8.5*     Discharge Medications:     Medication List    STOP taking these medications        HYDROcodone-acetaminophen 5-325 MG tablet  Commonly known as:  NORCO/VICODIN  TAKE these medications        atorvastatin 20 MG tablet  Commonly known as:  LIPITOR  Take 20 mg by mouth daily.     buPROPion 300 MG 24 hr tablet  Commonly known as:  WELLBUTRIN XL  Take 300 mg by mouth every morning.     buPROPion 150 MG 24 hr tablet  Commonly known as:  WELLBUTRIN XL  Take 150 mg by mouth every morning.     docusate sodium 100 MG capsule  Commonly known as:  COLACE  1 tab 2 times a day while on narcotics.  STOOL SOFTENER     enoxaparin 30 MG/0.3ML injection  Commonly known as:  LOVENOX  Inject 0.3 mLs (30 mg total) into the skin every 12 (twelve) hours.     felodipine 10 MG 24 hr tablet  Commonly known as:  PLENDIL  Take 10 mg by mouth daily.     HYDROmorphone 2 MG tablet  Commonly known as:  DILAUDID  1-3 tablets PO q 4-6 hrs prn pain      losartan 100 MG tablet  Commonly known as:  COZAAR  Take 100 mg by mouth daily.     metoCLOPramide 5 MG tablet  Commonly known as:  REGLAN  Take 1 tablet (5 mg total) by mouth every 8 (eight) hours as needed for nausea (if ondansetron (ZOFRAN) ineffective.).     metoprolol succinate 100 MG 24 hr tablet  Commonly known as:  TOPROL-XL  Take 100 mg by mouth daily.     ondansetron 4 MG tablet  Commonly known as:  ZOFRAN  Take 1 tablet (4 mg total) by mouth every 6 (six) hours as needed for nausea.     polyethylene glycol packet  Commonly known as:  MIRALAX / GLYCOLAX  17grams in 16 oz of water twice a day until bowel movement.  LAXITIVE.  Restart if two days since last bowel movement     potassium chloride SA 20 MEQ tablet  Commonly known as:  K-DUR,KLOR-CON  Take 40 mEq by mouth daily.     tamsulosin 0.4 MG Caps capsule  Commonly known as:  FLOMAX  Take 1 capsule (0.4 mg total) by mouth daily.        Diagnostic Studies: No results found.  Disposition: 01-Home or Self Care      Discharge Instructions    CPM    Complete by:  As directed   Continuous passive motion machine (CPM):      Use the CPM from 0 to 90 for 6 hours per day.       You may break it up into 2 or 3 sessions per day.      Use CPM for 2 weeks or until you are told to stop.     Call MD / Call 911    Complete by:  As directed   If you experience chest pain or shortness of breath, CALL 911 and be transported to the hospital emergency room.  If you develope a fever above 101 F, pus (white drainage) or increased drainage or redness at the wound, or calf pain, call your surgeon's office.     Change dressing    Complete by:  As directed   Change the gauze dressing daily with sterile 4 x 4 inch gauze and apply TED hose.  DO NOT REMOVE BANDAGE OVER SURGICAL INCISION.  [place] WHOLE LEG INCLUDING OVER THE WATERPROOF BANDAGE WITH SOAP AND WATER EVERY DAY.     Constipation Prevention  Complete by:  As directed   Drink  plenty of fluids.  Prune juice may be helpful.  You may use a stool softener, such as Colace (over the counter) 100 mg twice a day.  Use MiraLax (over the counter) for constipation as needed.     Diet - low sodium heart healthy    Complete by:  As directed      Discharge instructions    Complete by:  As directed   INSTRUCTIONS AFTER JOINT REPLACEMENT   Remove items at home which could result in a fall. This includes throw rugs or furniture in walking pathways ICE to the affected joint every three hours while awake for 30 minutes at a time, for at least the first 3-5 days, and then as needed for pain and swelling.  Continue to use ice for pain and swelling. You may notice swelling that will progress down to the foot and ankle.  This is normal after surgery.  Elevate your leg when you are not up walking on it.   Continue to use the breathing machine you got in the hospital (incentive spirometer) which will help keep your temperature down.  It is common for your temperature to cycle up and down following surgery, especially at night when you are not up moving around and exerting yourself.  The breathing machine keeps your lungs expanded and your temperature down.   DIET:  As you were doing prior to hospitalization, we recommend a well-balanced diet.  DRESSING / WOUND CARE / SHOWERING  Keep the surgical dressing until follow up.  The dressing is water proof, so you can shower without any extra covering.  IF THE DRESSING FALLS OFF or the wound gets wet inside, change the dressing with sterile gauze.  Please use good hand washing techniques before changing the dressing.  Do not use any lotions or creams on the incision until instructed by your surgeon.    ACTIVITY  Increase activity slowly as tolerated, but follow the weight bearing instructions below.   No driving for 6 weeks or until further direction given by your physician.  You cannot drive while taking narcotics.  No lifting or carrying greater  than 10 lbs. until further directed by your surgeon. Avoid periods of inactivity such as sitting longer than an hour when not asleep. This helps prevent blood clots.  You may return to work once you are authorized by your doctor.     WEIGHT BEARING   Weight bearing as tolerated with assist device (walker, cane, etc) as directed, use it as long as suggested by your surgeon or therapist, typically at least 1-2 weeks.   EXERCISES  Results after joint replacement surgery are often greatly improved when you follow the exercise, range of motion and muscle strengthening exercises prescribed by your doctor. Safety measures are also important to protect the joint from further injury. Any time any of these exercises cause you to have increased pain or swelling, decrease what you are doing until you are comfortable again and then slowly increase them. If you have problems or questions, call your caregiver or physical therapist for advice.   Rehabilitation is important following a joint replacement. After just a few days of immobilization, the muscles of the leg can become weakened and shrink (atrophy).  These exercises are designed to build up the tone and strength of the thigh and leg muscles and to improve motion. Often times heat used for twenty to thirty minutes before working out will loosen up your  tissues and help with improving the range of motion but do not use heat for the first two weeks following surgery (sometimes heat can increase post-operative swelling).   These exercises can be done on a training (exercise) mat, on the floor, on a table or on a bed. Use whatever works the best and is most comfortable for you.    Use music or television while you are exercising so that the exercises are a pleasant break in your day. This will make your life better with the exercises acting as a break in your routine that you can look forward to.   Perform all exercises about fifteen times, three times per day  or as directed.  You should exercise both the operative leg and the other leg as well.   Exercises include:   Quad Sets - Tighten up the muscle on the front of the thigh (Quad) and hold for 5-10 seconds.   Straight Leg Raises - With your knee straight (if you were given a brace, keep it on), lift the leg to 60 degrees, hold for 3 seconds, and slowly lower the leg.  Perform this exercise against resistance later as your leg gets stronger.  Leg Slides: Lying on your back, slowly slide your foot toward your buttocks, bending your knee up off the floor (only go as far as is comfortable). Then slowly slide your foot back down until your leg is flat on the floor again.  Angel Wings: Lying on your back spread your legs to the side as far apart as you can without causing discomfort.  Hamstring Strength:  Lying on your back, push your heel against the floor with your leg straight by tightening up the muscles of your buttocks.  Repeat, but this time bend your knee to a comfortable angle, and push your heel against the floor.  You may put a pillow under the heel to make it more comfortable if necessary.   A rehabilitation program following joint replacement surgery can speed recovery and prevent re-injury in the future due to weakened muscles. Contact your doctor or a physical therapist for more information on knee rehabilitation.    CONSTIPATION  Constipation is defined medically as fewer than three stools per week and severe constipation as less than one stool per week.  Even if you have a regular bowel pattern at home, your normal regimen is likely to be disrupted due to multiple reasons following surgery.  Combination of anesthesia, postoperative narcotics, change in appetite and fluid intake all can affect your bowels.   YOU MUST use at least one of the following options; they are listed in order of increasing strength to get the job done.  They are all available over the counter, and you may need to use  some, POSSIBLY even all of these options:    Drink plenty of fluids (prune juice may be helpful) and high fiber foods Colace 100 mg by mouth twice a day  Senokot for constipation as directed and as needed Dulcolax (bisacodyl), take with full glass of water  Miralax (polyethylene glycol) once or twice a day as needed.  If you have tried all these things and are unable to have a bowel movement in the first 3-4 days after surgery call either your surgeon or your primary doctor.    If you experience loose stools or diarrhea, hold the medications until you stool forms back up.  If your symptoms do not get better within 1 week or if they get worse,  check with your doctor.  If you experience "the worst abdominal pain ever" or develop nausea or vomiting, please contact the office immediately for further recommendations for treatment.   ITCHING:  If you experience itching with your medications, try taking only a single pain pill, or even half a pain pill at a time.  You can also use Benadryl over the counter for itching or also to help with sleep.   TED HOSE STOCKINGS:  Use stockings on both legs until for at least 2 weeks or as directed by physician office. They may be removed at night for sleeping.  MEDICATIONS:  See your medication summary on the "After Visit Summary" that nursing will review with you.  You may have some home medications which will be placed on hold until you complete the course of blood thinner medication.  It is important for you to complete the blood thinner medication as prescribed.  PRECAUTIONS:  If you experience chest pain or shortness of breath - call 911 immediately for transfer to the hospital emergency department.   If you develop a fever greater that 101 F, purulent drainage from wound, increased redness or drainage from wound, foul odor from the wound/dressing, or calf pain - CONTACT YOUR SURGEON.                                                   FOLLOW-UP APPOINTMENTS:   If you do not already have a post-op appointment, please call the office for an appointment to be seen by your surgeon.  Guidelines for how soon to be seen are listed in your "After Visit Summary", but are typically between 1-4 weeks after surgery.  OTHER INSTRUCTIONS:   Knee Replacement:  Do not place pillow under knee, focus on keeping the knee straight while resting. CPM instructions: 0-90 degrees, 2 hours in the morning, 2 hours in the afternoon, and 2 hours in the evening. Place foam block, curve side up under heel at all times except when in CPM or when walking.  DO NOT modify, tear, cut, or change the foam block in any way.  MAKE SURE YOU:  Understand these instructions.  Get help right away if you are not doing well or get worse.    Thank you for letting us be a part of your medical care team.  It is a privilege we respect greatly.  We hope these instructions will help you stay on track for a fast and full recovery!     Do not put a pillow under the knee. Place it under the heel.    Complete by:  As directed   Place gray foam block, curve side up under heel at all times except when in CPM or when walking.  DO NOT modify, tear, cut, or change in any way the gray foam block.     Increase activity slowly as tolerated    Complete by:  As directed      TED hose    Complete by:  As directed   Use stockings (TED hose) for 2 weeks on both leg(s).  You may remove them at night for sleeping.           Follow-up Information    Follow up with Lorn Junes, MD On 05/05/2015.   Specialty:  Orthopedic Surgery   Why:  appt time 2pm  Contact information:   [place] [address] [phone]       Follow up with [facility].   Why:  Someone from [facility] will contact you concerning start date and time for therapy.   Contact information:   [address] [place] [phone]         Signed: Linda Hedges 04/22/2015, 4:28 PM

## 2015-04-22 NOTE — Care Management Note (Signed)
Case Management Note  Patient Details  Name: Bright Kille. MRN: [id number] Date of Birth: 1945/08/22  Subjective/Objective:   70 yr old male s/p left total knee arthroplasty.               Action/Plan: Case manager spoke with patient and wife concerning discharge plan and DME needs. Patient was preoperatively setup with [facility], no changes. He will have family support at discharge. DME has been ordered  Expected Discharge Plan:  [place]  In-House Referral:  NA  Discharge planning Services  CM Consult  Post Acute Care Choice:  Durable Medical Equipment, Home Health Choice offered to:  Patient  DME Arranged:  3-N-1, Walker rolling; CPM DME Agency:  [place]    HH Arranged:  PT [facility] Agency:  [place]  Status of Service:  Completed, signed off  Medicare Important Message Given:    Date Medicare IM Given:    Medicare IM give by:    Date Additional Medicare IM Given:    Additional Medicare Important Message give by:     If discussed at [place] of Stay Meetings, dates discussed:    Additional Comments:  Ninfa Meeker, RN 04/22/2015, 11:39 AM

## 2015-04-22 NOTE — Discharge Summary (Signed)
Patient ID: George [name]. MRN: [id number] DOB/AGE: 1945/07/26 70 y.o.  Admit date: 04/21/2015 Discharge date: 04/22/2015  Admission Diagnoses:  Principal Problem:   Primary localized osteoarthritis of left knee Active Problems:   Malignant neoplasm of prostate (HCC)   Benign localized hyperplasia of prostate with urinary obstruction   Spermatocele   Generalized anxiety disorder   Panic attacks   DJD (degenerative joint disease) of knee   Discharge Diagnoses:  Same  Past Medical History  Diagnosis Date  . Elevated prostate specific antigen (PSA)   . Hematospermia   . Incomplete bladder emptying   . Nodular prostate with lower urinary tract symptoms   . Prostatitis   . Spermatocele   . Weak urinary stream   . Hx antineoplastic chemotherapy 2010  . Anxiety   . Depression   . Hypertension   . Malignant neoplasm of prostate ([place])   . Colon cancer ([place])   . 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 Generalized anxiety disorder 04/09/2015  . Panic attacks 04/09/2015  . Arthritis     OSTEO IN KNEE    Surgeries: Procedure(s): LEFT TOTAL KNEE ARTHROPLASTY on 04/21/2015   Consultants:    Discharged Condition: Improved  Hospital Course: George [name]. is an 70 y.o. male who was admitted 04/21/2015 for operative treatment ofPrimary localized osteoarthritis of left knee. Patient has severe unremitting pain that affects sleep, daily activities, and work/hobbies. After pre-op clearance the patient was taken to the operating room on 04/21/2015 and underwent  Procedure(s): LEFT TOTAL KNEE ARTHROPLASTY.    Patient was given perioperative antibiotics: Anti-infectives    Start     Dose/Rate Route Frequency Ordered Stop   04/21/15 1730  ceFAZolin (ANCEF) IVPB 2 g/50 mL premix     2 g 100 mL/hr over 30 Minutes Intravenous Every 6 hours 04/21/15 1717 04/22/15 0030   04/21/15 1030  ceFAZolin (ANCEF) IVPB 2 g/50 mL premix     2 g 100 mL/hr over 30  Minutes Intravenous To [facility] 04/20/15 1146 04/21/15 1109       Patient was given sequential compression devices, early ambulation, and chemoprophylaxis to prevent DVT.  Patient benefited maximally from hospital stay and there were no complications.    Recent vital signs: Patient Vitals for the past 24 hrs:  BP Temp Temp src Pulse Resp SpO2  04/22/15 0646 (!) 158/91 mmHg 98.2 F (36.8 C) - 85 - 100 %  04/22/15 0322 (!) 156/88 mmHg 98.1 F (36.7 C) - 82 16 100 %  04/21/15 1935 (!) 154/87 mmHg 97.7 F (36.5 C) Oral 76 15 100 %  04/21/15 1715 (!) 140/56 mmHg 97.7 F (36.5 C) - 60 12 100 %  04/21/15 1600 127/84 mmHg 97.5 F (36.4 C) - (!) 57 11 100 %  04/21/15 1555 126/79 mmHg - - (!) 55 13 100 %  04/21/15 1540 133/82 mmHg - - (!) 57 14 100 %  04/21/15 1525 127/83 mmHg - - (!) 55 10 99 %  04/21/15 1510 122/80 mmHg - - (!) 58 12 99 %  04/21/15 1455 127/78 mmHg - - (!) 57 13 98 %  04/21/15 1440 119/80 mmHg - - (!) 56 11 99 %  04/21/15 1425 120/80 mmHg - - (!) 58 11 98 %  04/21/15 1410 126/78 mmHg - - (!) 55 14 97 %  04/21/15 1355 113/71 mmHg - - (!) 55 20 100 %  04/21/15 1340 100/71 mmHg - - Marland Kitchen)  57 13 99 %  04/21/15 1325 102/66 mmHg - - (!) 58 13 95 %  04/21/15 1310 98/60 mmHg 97.6 F (36.4 C) - 61 13 97 %  04/21/15 1055 - - - 64 16 94 %  04/21/15 1050 130/80 mmHg - - 63 10 99 %  04/21/15 1045 128/73 mmHg - - 66 12 97 %  04/21/15 1040 132/69 mmHg - - 61 14 97 %  04/21/15 1035 132/77 mmHg - - 63 (!) 9 99 %  04/21/15 1030 137/79 mmHg - - 64 14 91 %  04/21/15 1025 132/78 mmHg - - 68 19 99 %     Recent laboratory studies:  Recent Labs  04/21/15 1821 04/22/15 0448  WBC 11.8* 13.4*  HGB 10.7* 10.2*  HCT 33.5* 31.6*  PLT 218 239  NA  --  140  K  --  3.8  CL  --  108  CO2  --  24  BUN  --  13  CREATININE 0.93 1.25*  GLUCOSE  --  168*  CALCIUM  --  8.5*     Discharge Medications:     Medication List    STOP taking these medications         HYDROcodone-acetaminophen 5-325 MG tablet  Commonly known as:  NORCO/VICODIN      TAKE these medications        atorvastatin 20 MG tablet  Commonly known as:  LIPITOR  Take 20 mg by mouth daily.     buPROPion 300 MG 24 hr tablet  Commonly known as:  WELLBUTRIN XL  Take 300 mg by mouth every morning.     buPROPion 150 MG 24 hr tablet  Commonly known as:  WELLBUTRIN XL  Take 150 mg by mouth every morning.     docusate sodium 100 MG capsule  Commonly known as:  COLACE  1 tab 2 times a day while on narcotics.  STOOL SOFTENER     enoxaparin 30 MG/0.3ML injection  Commonly known as:  LOVENOX  Inject 0.3 mLs (30 mg total) into the skin every 12 (twelve) hours.     felodipine 10 MG 24 hr tablet  Commonly known as:  PLENDIL  Take 10 mg by mouth daily.     HYDROmorphone 2 MG tablet  Commonly known as:  DILAUDID  1-3 tablets PO q 4-6 hrs prn pain     losartan 100 MG tablet  Commonly known as:  COZAAR  Take 100 mg by mouth daily.     metoCLOPramide 5 MG tablet  Commonly known as:  REGLAN  Take 1 tablet (5 mg total) by mouth every 8 (eight) hours as needed for nausea (if ondansetron (ZOFRAN) ineffective.).     metoprolol succinate 100 MG 24 hr tablet  Commonly known as:  TOPROL-XL  Take 100 mg by mouth daily.     ondansetron 4 MG tablet  Commonly known as:  ZOFRAN  Take 1 tablet (4 mg total) by mouth every 6 (six) hours as needed for nausea.     polyethylene glycol packet  Commonly known as:  MIRALAX / GLYCOLAX  17grams in 16 oz of water twice a day until bowel movement.  LAXITIVE.  Restart if two days since last bowel movement     potassium chloride SA 20 MEQ tablet  Commonly known as:  K-DUR,KLOR-CON  Take 40 mEq by mouth daily.        Diagnostic Studies: No results found.  Disposition: 01-Home or Self Care  Discharge Instructions    CPM    Complete by:  As directed   Continuous passive motion machine (CPM):      Use the CPM from 0 to 90 for 6 hours per  day.       You may break it up into 2 or 3 sessions per day.      Use CPM for 2 weeks or until you are told to stop.     Call MD / Call 911    Complete by:  As directed   If you experience chest pain or shortness of breath, CALL 911 and be transported to the hospital emergency room.  If you develope a fever above 101 F, pus (white drainage) or increased drainage or redness at the wound, or calf pain, call your surgeon's office.     Change dressing    Complete by:  As directed   Change the gauze dressing daily with sterile 4 x 4 inch gauze and apply TED hose.  DO NOT REMOVE BANDAGE OVER SURGICAL INCISION.  [place] WHOLE LEG INCLUDING OVER THE WATERPROOF BANDAGE WITH SOAP AND WATER EVERY DAY.     Constipation Prevention    Complete by:  As directed   Drink plenty of fluids.  Prune juice may be helpful.  You may use a stool softener, such as Colace (over the counter) 100 mg twice a day.  Use MiraLax (over the counter) for constipation as needed.     Diet - low sodium heart healthy    Complete by:  As directed      Discharge instructions    Complete by:  As directed   INSTRUCTIONS AFTER JOINT REPLACEMENT   Remove items at home which could result in a fall. This includes throw rugs or furniture in walking pathways ICE to the affected joint every three hours while awake for 30 minutes at a time, for at least the first 3-5 days, and then as needed for pain and swelling.  Continue to use ice for pain and swelling. You may notice swelling that will progress down to the foot and ankle.  This is normal after surgery.  Elevate your leg when you are not up walking on it.   Continue to use the breathing machine you got in the hospital (incentive spirometer) which will help keep your temperature down.  It is common for your temperature to cycle up and down following surgery, especially at night when you are not up moving around and exerting yourself.  The breathing machine keeps your lungs expanded and your  temperature down.   DIET:  As you were doing prior to hospitalization, we recommend a well-balanced diet.  DRESSING / WOUND CARE / SHOWERING  Keep the surgical dressing until follow up.  The dressing is water proof, so you can shower without any extra covering.  IF THE DRESSING FALLS OFF or the wound gets wet inside, change the dressing with sterile gauze.  Please use good hand washing techniques before changing the dressing.  Do not use any lotions or creams on the incision until instructed by your surgeon.    ACTIVITY  Increase activity slowly as tolerated, but follow the weight bearing instructions below.   No driving for 6 weeks or until further direction given by your physician.  You cannot drive while taking narcotics.  No lifting or carrying greater than 10 lbs. until further directed by your surgeon. Avoid periods of inactivity such as sitting longer than an hour when not asleep. This  helps prevent blood clots.  You may return to work once you are authorized by your doctor.     WEIGHT BEARING   Weight bearing as tolerated with assist device (walker, cane, etc) as directed, use it as long as suggested by your surgeon or therapist, typically at least 1-2 weeks.   EXERCISES  Results after joint replacement surgery are often greatly improved when you follow the exercise, range of motion and muscle strengthening exercises prescribed by your doctor. Safety measures are also important to protect the joint from further injury. Any time any of these exercises cause you to have increased pain or swelling, decrease what you are doing until you are comfortable again and then slowly increase them. If you have problems or questions, call your caregiver or physical therapist for advice.   Rehabilitation is important following a joint replacement. After just a few days of immobilization, the muscles of the leg can become weakened and shrink (atrophy).  These exercises are designed to build up the  tone and strength of the thigh and leg muscles and to improve motion. Often times heat used for twenty to thirty minutes before working out will loosen up your tissues and help with improving the range of motion but do not use heat for the first two weeks following surgery (sometimes heat can increase post-operative swelling).   These exercises can be done on a training (exercise) mat, on the floor, on a table or on a bed. Use whatever works the best and is most comfortable for you.    Use music or television while you are exercising so that the exercises are a pleasant break in your day. This will make your life better with the exercises acting as a break in your routine that you can look forward to.   Perform all exercises about fifteen times, three times per day or as directed.  You should exercise both the operative leg and the other leg as well.   Exercises include:   Quad Sets - Tighten up the muscle on the front of the thigh (Quad) and hold for 5-10 seconds.   Straight Leg Raises - With your knee straight (if you were given a brace, keep it on), lift the leg to 60 degrees, hold for 3 seconds, and slowly lower the leg.  Perform this exercise against resistance later as your leg gets stronger.  Leg Slides: Lying on your back, slowly slide your foot toward your buttocks, bending your knee up off the floor (only go as far as is comfortable). Then slowly slide your foot back down until your leg is flat on the floor again.  Angel Wings: Lying on your back spread your legs to the side as far apart as you can without causing discomfort.  Hamstring Strength:  Lying on your back, push your heel against the floor with your leg straight by tightening up the muscles of your buttocks.  Repeat, but this time bend your knee to a comfortable angle, and push your heel against the floor.  You may put a pillow under the heel to make it more comfortable if necessary.   A rehabilitation program following joint  replacement surgery can speed recovery and prevent re-injury in the future due to weakened muscles. Contact your doctor or a physical therapist for more information on knee rehabilitation.    CONSTIPATION  Constipation is defined medically as fewer than three stools per week and severe constipation as less than one stool per week.  Even if you  have a regular bowel pattern at home, your normal regimen is likely to be disrupted due to multiple reasons following surgery.  Combination of anesthesia, postoperative narcotics, change in appetite and fluid intake all can affect your bowels.   YOU MUST use at least one of the following options; they are listed in order of increasing strength to get the job done.  They are all available over the counter, and you may need to use some, POSSIBLY even all of these options:    Drink plenty of fluids (prune juice may be helpful) and high fiber foods Colace 100 mg by mouth twice a day  Senokot for constipation as directed and as needed Dulcolax (bisacodyl), take with full glass of water  Miralax (polyethylene glycol) once or twice a day as needed.  If you have tried all these things and are unable to have a bowel movement in the first 3-4 days after surgery call either your surgeon or your primary doctor.    If you experience loose stools or diarrhea, hold the medications until you stool forms back up.  If your symptoms do not get better within 1 week or if they get worse, check with your doctor.  If you experience "the worst abdominal pain ever" or develop nausea or vomiting, please contact the office immediately for further recommendations for treatment.   ITCHING:  If you experience itching with your medications, try taking only a single pain pill, or even half a pain pill at a time.  You can also use Benadryl over the counter for itching or also to help with sleep.   TED HOSE STOCKINGS:  Use stockings on both legs until for at least 2 weeks or as directed by  physician office. They may be removed at night for sleeping.  MEDICATIONS:  See your medication summary on the "After Visit Summary" that nursing will review with you.  You may have some home medications which will be placed on hold until you complete the course of blood thinner medication.  It is important for you to complete the blood thinner medication as prescribed.  PRECAUTIONS:  If you experience chest pain or shortness of breath - call 911 immediately for transfer to the hospital emergency department.   If you develop a fever greater that 101 F, purulent drainage from wound, increased redness or drainage from wound, foul odor from the wound/dressing, or calf pain - CONTACT YOUR SURGEON.                                                   FOLLOW-UP APPOINTMENTS:  If you do not already have a post-op appointment, please call the office for an appointment to be seen by your surgeon.  Guidelines for how soon to be seen are listed in your "After Visit Summary", but are typically between 1-4 weeks after surgery.  OTHER INSTRUCTIONS:   Knee Replacement:  Do not place pillow under knee, focus on keeping the knee straight while resting. CPM instructions: 0-90 degrees, 2 hours in the morning, 2 hours in the afternoon, and 2 hours in the evening. Place foam block, curve side up under heel at all times except when in CPM or when walking.  DO NOT modify, tear, cut, or change the foam block in any way.  MAKE SURE YOU:  Understand these instructions.  Get help right  away if you are not doing well or get worse.    Thank you for letting us be a part of your medical care team.  It is a privilege we respect greatly.  We hope these instructions will help you stay on track for a fast and full recovery!     Do not put a pillow under the knee. Place it under the heel.    Complete by:  As directed   Place gray foam block, curve side up under heel at all times except when in CPM or when walking.  DO NOT modify,  tear, cut, or change in any way the gray foam block.     Increase activity slowly as tolerated    Complete by:  As directed      TED hose    Complete by:  As directed   Use stockings (TED hose) for 2 weeks on both leg(s).  You may remove them at night for sleeping.           Follow-up Information    Follow up with Lorn Junes, MD On 05/05/2015.   Specialty:  Orthopedic Surgery   Why:  appt time 2pm   Contact information:   [address] [place] [address] [phone]        Signed: Linda Hedges 04/22/2015, 10:22 AM

## 2015-04-22 NOTE — Progress Notes (Signed)
Physical Therapy Treatment Patient Details Name: George [name]. MRN: [id number] DOB: 05-15-1945 Today's Date: 04/22/2015    History of Present Illness Pt admitted for L TKA. PMHx: anxiety, HTN, colon CA    PT Comments    Pt continues to move well with gait and HEP. Pt reports ice makes the pain worse and educated for breaks but for continued use to decrease inflammation. Pt educated for CPM and bone foam. Will continue to follow.   Follow Up Recommendations  Home health PT     Equipment Recommendations  Rolling walker with 5" wheels;3in1 (PT)    Recommendations for Other Services       Precautions / Restrictions Precautions Precautions: Knee Precaution Booklet Issued: No Precaution Comments: Reviewed precautions Restrictions Weight Bearing Restrictions: Yes LLE Weight Bearing: Weight bearing as tolerated    Mobility  Bed Mobility               General bed mobility comments: in chair on arrival  Transfers Overall transfer level: Needs assistance Equipment used: Rolling walker (2 wheeled) Transfers: Sit to/from Stand Sit to Stand: Supervision         General transfer comment: Cues for safe hand placement  Ambulation/Gait Ambulation/Gait assistance: Supervision Ambulation Distance (Feet): 300 Feet Assistive device: Rolling walker (2 wheeled) Gait Pattern/deviations: Step-through pattern;Decreased stride length;Decreased dorsiflexion - left   Gait velocity interpretation: Below normal speed for age/gender General Gait Details: cues for heel strike, posture    Stairs            Wheelchair Mobility    Modified Rankin (Stroke Patients Only)       Balance Overall balance assessment: Needs assistance Sitting-balance support: No upper extremity supported;Feet supported Sitting balance-Leahy Scale: Good     Standing balance support: No upper extremity supported;During functional activity Standing balance-Leahy Scale: Fair Standing balance  comment: Able to complete grooming tasks at sink with no UE support for ~ 5 minutes                    Cognition Arousal/Alertness: Awake/alert Behavior During Therapy: WFL for tasks assessed/performed Overall Cognitive Status: Within Functional Limits for tasks assessed                      Exercises Total Joint Exercises Heel Slides: AROM;Left;15 reps;Seated Hip ABduction/ADduction: AROM;Seated;Left;15 reps Straight Leg Raises: AROM;Seated;Left;15 reps Long Arc Quad: AROM;Seated;Left;15 reps Goniometric ROM: 8-70    General Comments General comments (skin integrity, edema, etc.): Pt's friend who will be home 24/7 with him was present for session.      Pertinent Vitals/Pain Pain Assessment: 0-10 Pain Score: 6  Pain Location: left knee Pain Descriptors / Indicators: Aching;Sore Pain Intervention(s): Limited activity within patient's tolerance;Premedicated before session;Repositioned    Home Living Family/patient expects to be discharged to:: Private residence Living Arrangements: Non-relatives/Friends Available Help at Discharge: Friend(s);Available 24 hours/day Type of Home: House Home Access: Level entry   Home Layout: One level Home Equipment: Hand held shower head      Prior Function Level of Independence: Independent          PT Goals (current goals can now be found in the care plan section) Acute Rehab PT Goals Patient Stated Goal: to go home Progress towards PT goals: Progressing toward goals    Frequency       PT Plan Current plan remains appropriate    Co-evaluation             End of  Session   Activity Tolerance: Patient tolerated treatment well Patient left: in chair;with call bell/phone within reach;with family/visitor present     Time: [id number] PT Time Calculation (min) (ACUTE ONLY): 15 min  Charges:  $Gait Training: 8-22 mins                    G Codes:      Melford Aase 05/16/15, 11:52 AM Elwyn Reach, [place]

## 2015-04-22 NOTE — Progress Notes (Signed)
Subjective: 1 Day Post-Op Procedure(s) (LRB): LEFT TOTAL KNEE ARTHROPLASTY (Left) Patient reports pain as 8 on 0-10 scale and severe.   Patient pain is not under control with po pain meds.  He states that oral pain meds make him nauseated.  Objective: Vital signs in last 24 hours: Temp:  [97.5 F (36.4 C)-98.2 F (36.8 C)] 98.2 F (36.8 C) (01/10 0646) Pulse Rate:  [55-85] 85 (01/10 0646) Resp:  [9-20] 16 (01/10 0322) BP: (98-158)/(56-91) 158/91 mmHg (01/10 0646) SpO2:  [91 %-100 %] 100 % (01/10 0646)  Intake/Output from previous day: 01/09 0701 - 01/10 0700 In: 2470 [I.V.:2420; IV Piggyback:50] Out: 2050 [Urine:2050] Intake/Output this shift:     Recent Labs  04/21/15 1821 04/22/15 0448  HGB 10.7* 10.2*    Recent Labs  04/21/15 1821 04/22/15 0448  WBC 11.8* 13.4*  RBC 3.78* 3.63*  HCT 33.5* 31.6*  PLT 218 239    Recent Labs  04/21/15 1821 04/22/15 0448  NA  --  140  K  --  3.8  CL  --  108  CO2  --  24  BUN  --  13  CREATININE 0.93 1.25*  GLUCOSE  --  168*  CALCIUM  --  8.5*   No results for input(s): LABPT, INR in the last 72 hours.  ABD soft Neurovascular intact Sensation intact distally Intact pulses distally Dorsiflexion/Plantar flexion intact Incision: dressing C/D/I  Assessment/Plan: 1 Day Post-Op Procedure(s) (LRB): LEFT TOTAL KNEE ARTHROPLASTY (Left)  Principal Problem:   Primary localized osteoarthritis of left knee Active Problems:   Malignant neoplasm of prostate (HCC)   Benign localized hyperplasia of prostate with urinary obstruction   Spermatocele   Generalized anxiety disorder   Panic attacks   DJD (degenerative joint disease) of knee  Advance diet Up with therapy  Will d/c oxycodone and give oral dilaudid to try to get his pain under control.  Will use zofran and reglan for nausea.   Only give IV meds if patient has taken all of his oral medication and his pain is not controlled.  Ebany Bowermaster J 04/22/2015, 10:01  AM

## 2015-04-24 DIAGNOSIS — Z96652 Presence of left artificial knee joint: Secondary | ICD-10-CM | POA: Diagnosis not present

## 2015-04-24 DIAGNOSIS — F410 Panic disorder [episodic paroxysmal anxiety] without agoraphobia: Secondary | ICD-10-CM | POA: Diagnosis not present

## 2015-04-24 DIAGNOSIS — I10 Essential (primary) hypertension: Secondary | ICD-10-CM | POA: Diagnosis not present

## 2015-04-24 DIAGNOSIS — F329 Major depressive disorder, single episode, unspecified: Secondary | ICD-10-CM | POA: Diagnosis not present

## 2015-04-24 DIAGNOSIS — C61 Malignant neoplasm of prostate: Secondary | ICD-10-CM | POA: Diagnosis not present

## 2015-04-24 DIAGNOSIS — Z471 Aftercare following joint replacement surgery: Secondary | ICD-10-CM | POA: Diagnosis not present

## 2015-04-25 DIAGNOSIS — Z96652 Presence of left artificial knee joint: Secondary | ICD-10-CM | POA: Diagnosis not present

## 2015-04-25 DIAGNOSIS — I10 Essential (primary) hypertension: Secondary | ICD-10-CM | POA: Diagnosis not present

## 2015-04-25 DIAGNOSIS — F329 Major depressive disorder, single episode, unspecified: Secondary | ICD-10-CM | POA: Diagnosis not present

## 2015-04-25 DIAGNOSIS — F410 Panic disorder [episodic paroxysmal anxiety] without agoraphobia: Secondary | ICD-10-CM | POA: Diagnosis not present

## 2015-04-25 DIAGNOSIS — Z471 Aftercare following joint replacement surgery: Secondary | ICD-10-CM | POA: Diagnosis not present

## 2015-04-25 DIAGNOSIS — C61 Malignant neoplasm of prostate: Secondary | ICD-10-CM | POA: Diagnosis not present

## 2015-04-28 DIAGNOSIS — F410 Panic disorder [episodic paroxysmal anxiety] without agoraphobia: Secondary | ICD-10-CM | POA: Diagnosis not present

## 2015-04-28 DIAGNOSIS — Z471 Aftercare following joint replacement surgery: Secondary | ICD-10-CM | POA: Diagnosis not present

## 2015-04-28 DIAGNOSIS — Z96652 Presence of left artificial knee joint: Secondary | ICD-10-CM | POA: Diagnosis not present

## 2015-04-28 DIAGNOSIS — I10 Essential (primary) hypertension: Secondary | ICD-10-CM | POA: Diagnosis not present

## 2015-04-28 DIAGNOSIS — C61 Malignant neoplasm of prostate: Secondary | ICD-10-CM | POA: Diagnosis not present

## 2015-04-28 DIAGNOSIS — F329 Major depressive disorder, single episode, unspecified: Secondary | ICD-10-CM | POA: Diagnosis not present

## 2015-04-30 DIAGNOSIS — Z96652 Presence of left artificial knee joint: Secondary | ICD-10-CM | POA: Diagnosis not present

## 2015-04-30 DIAGNOSIS — F410 Panic disorder [episodic paroxysmal anxiety] without agoraphobia: Secondary | ICD-10-CM | POA: Diagnosis not present

## 2015-04-30 DIAGNOSIS — C61 Malignant neoplasm of prostate: Secondary | ICD-10-CM | POA: Diagnosis not present

## 2015-04-30 DIAGNOSIS — Z471 Aftercare following joint replacement surgery: Secondary | ICD-10-CM | POA: Diagnosis not present

## 2015-04-30 DIAGNOSIS — F329 Major depressive disorder, single episode, unspecified: Secondary | ICD-10-CM | POA: Diagnosis not present

## 2015-04-30 DIAGNOSIS — I10 Essential (primary) hypertension: Secondary | ICD-10-CM | POA: Diagnosis not present

## 2015-05-01 DIAGNOSIS — I10 Essential (primary) hypertension: Secondary | ICD-10-CM | POA: Diagnosis not present

## 2015-05-01 DIAGNOSIS — M25562 Pain in left knee: Secondary | ICD-10-CM | POA: Diagnosis not present

## 2015-05-01 DIAGNOSIS — E785 Hyperlipidemia, unspecified: Secondary | ICD-10-CM | POA: Diagnosis not present

## 2015-05-02 DIAGNOSIS — Z96652 Presence of left artificial knee joint: Secondary | ICD-10-CM | POA: Diagnosis not present

## 2015-05-02 DIAGNOSIS — Z471 Aftercare following joint replacement surgery: Secondary | ICD-10-CM | POA: Diagnosis not present

## 2015-05-02 DIAGNOSIS — F329 Major depressive disorder, single episode, unspecified: Secondary | ICD-10-CM | POA: Diagnosis not present

## 2015-05-02 DIAGNOSIS — F410 Panic disorder [episodic paroxysmal anxiety] without agoraphobia: Secondary | ICD-10-CM | POA: Diagnosis not present

## 2015-05-02 DIAGNOSIS — C61 Malignant neoplasm of prostate: Secondary | ICD-10-CM | POA: Diagnosis not present

## 2015-05-02 DIAGNOSIS — I10 Essential (primary) hypertension: Secondary | ICD-10-CM | POA: Diagnosis not present

## 2015-05-05 DIAGNOSIS — R531 Weakness: Secondary | ICD-10-CM | POA: Diagnosis not present

## 2015-05-05 DIAGNOSIS — M1712 Unilateral primary osteoarthritis, left knee: Secondary | ICD-10-CM | POA: Diagnosis not present

## 2015-05-05 DIAGNOSIS — M25562 Pain in left knee: Secondary | ICD-10-CM | POA: Diagnosis not present

## 2015-05-05 DIAGNOSIS — R262 Difficulty in walking, not elsewhere classified: Secondary | ICD-10-CM | POA: Diagnosis not present

## 2015-05-05 DIAGNOSIS — Z96652 Presence of left artificial knee joint: Secondary | ICD-10-CM | POA: Diagnosis not present

## 2015-05-07 DIAGNOSIS — R531 Weakness: Secondary | ICD-10-CM | POA: Diagnosis not present

## 2015-05-07 DIAGNOSIS — M1712 Unilateral primary osteoarthritis, left knee: Secondary | ICD-10-CM | POA: Diagnosis not present

## 2015-05-07 DIAGNOSIS — M25562 Pain in left knee: Secondary | ICD-10-CM | POA: Diagnosis not present

## 2015-05-07 DIAGNOSIS — R262 Difficulty in walking, not elsewhere classified: Secondary | ICD-10-CM | POA: Diagnosis not present

## 2015-05-12 DIAGNOSIS — R262 Difficulty in walking, not elsewhere classified: Secondary | ICD-10-CM | POA: Diagnosis not present

## 2015-05-12 DIAGNOSIS — M1712 Unilateral primary osteoarthritis, left knee: Secondary | ICD-10-CM | POA: Diagnosis not present

## 2015-05-12 DIAGNOSIS — M25562 Pain in left knee: Secondary | ICD-10-CM | POA: Diagnosis not present

## 2015-05-12 DIAGNOSIS — R531 Weakness: Secondary | ICD-10-CM | POA: Diagnosis not present

## 2015-05-14 DIAGNOSIS — R531 Weakness: Secondary | ICD-10-CM | POA: Diagnosis not present

## 2015-05-14 DIAGNOSIS — M1712 Unilateral primary osteoarthritis, left knee: Secondary | ICD-10-CM | POA: Diagnosis not present

## 2015-05-14 DIAGNOSIS — R262 Difficulty in walking, not elsewhere classified: Secondary | ICD-10-CM | POA: Diagnosis not present

## 2015-05-14 DIAGNOSIS — M25562 Pain in left knee: Secondary | ICD-10-CM | POA: Diagnosis not present

## 2015-05-20 DIAGNOSIS — Z96652 Presence of left artificial knee joint: Secondary | ICD-10-CM | POA: Diagnosis not present

## 2015-05-21 DIAGNOSIS — R531 Weakness: Secondary | ICD-10-CM | POA: Diagnosis not present

## 2015-05-21 DIAGNOSIS — M1712 Unilateral primary osteoarthritis, left knee: Secondary | ICD-10-CM | POA: Diagnosis not present

## 2015-05-21 DIAGNOSIS — R262 Difficulty in walking, not elsewhere classified: Secondary | ICD-10-CM | POA: Diagnosis not present

## 2015-05-21 DIAGNOSIS — M25562 Pain in left knee: Secondary | ICD-10-CM | POA: Diagnosis not present

## 2015-05-29 DIAGNOSIS — R531 Weakness: Secondary | ICD-10-CM | POA: Diagnosis not present

## 2015-05-29 DIAGNOSIS — R262 Difficulty in walking, not elsewhere classified: Secondary | ICD-10-CM | POA: Diagnosis not present

## 2015-05-29 DIAGNOSIS — M1712 Unilateral primary osteoarthritis, left knee: Secondary | ICD-10-CM | POA: Diagnosis not present

## 2015-05-29 DIAGNOSIS — M25562 Pain in left knee: Secondary | ICD-10-CM | POA: Diagnosis not present

## 2015-06-04 DIAGNOSIS — M25562 Pain in left knee: Secondary | ICD-10-CM | POA: Diagnosis not present

## 2015-06-04 DIAGNOSIS — M1712 Unilateral primary osteoarthritis, left knee: Secondary | ICD-10-CM | POA: Diagnosis not present

## 2015-06-04 DIAGNOSIS — R531 Weakness: Secondary | ICD-10-CM | POA: Diagnosis not present

## 2015-06-04 DIAGNOSIS — R262 Difficulty in walking, not elsewhere classified: Secondary | ICD-10-CM | POA: Diagnosis not present

## 2015-06-06 DIAGNOSIS — M1712 Unilateral primary osteoarthritis, left knee: Secondary | ICD-10-CM | POA: Diagnosis not present

## 2015-06-06 DIAGNOSIS — M25562 Pain in left knee: Secondary | ICD-10-CM | POA: Diagnosis not present

## 2015-06-06 DIAGNOSIS — R262 Difficulty in walking, not elsewhere classified: Secondary | ICD-10-CM | POA: Diagnosis not present

## 2015-06-06 DIAGNOSIS — R531 Weakness: Secondary | ICD-10-CM | POA: Diagnosis not present

## 2015-06-09 DIAGNOSIS — M25562 Pain in left knee: Secondary | ICD-10-CM | POA: Diagnosis not present

## 2015-06-09 DIAGNOSIS — R531 Weakness: Secondary | ICD-10-CM | POA: Diagnosis not present

## 2015-06-09 DIAGNOSIS — M1712 Unilateral primary osteoarthritis, left knee: Secondary | ICD-10-CM | POA: Diagnosis not present

## 2015-06-09 DIAGNOSIS — R262 Difficulty in walking, not elsewhere classified: Secondary | ICD-10-CM | POA: Diagnosis not present

## 2015-06-11 DIAGNOSIS — R531 Weakness: Secondary | ICD-10-CM | POA: Diagnosis not present

## 2015-06-11 DIAGNOSIS — M1712 Unilateral primary osteoarthritis, left knee: Secondary | ICD-10-CM | POA: Diagnosis not present

## 2015-06-11 DIAGNOSIS — R262 Difficulty in walking, not elsewhere classified: Secondary | ICD-10-CM | POA: Diagnosis not present

## 2015-06-11 DIAGNOSIS — M25562 Pain in left knee: Secondary | ICD-10-CM | POA: Diagnosis not present

## 2015-06-16 DIAGNOSIS — M25562 Pain in left knee: Secondary | ICD-10-CM | POA: Diagnosis not present

## 2015-06-16 DIAGNOSIS — M1712 Unilateral primary osteoarthritis, left knee: Secondary | ICD-10-CM | POA: Diagnosis not present

## 2015-06-16 DIAGNOSIS — R531 Weakness: Secondary | ICD-10-CM | POA: Diagnosis not present

## 2015-06-16 DIAGNOSIS — R262 Difficulty in walking, not elsewhere classified: Secondary | ICD-10-CM | POA: Diagnosis not present

## 2015-06-17 DIAGNOSIS — M5416 Radiculopathy, lumbar region: Secondary | ICD-10-CM | POA: Diagnosis not present

## 2015-06-17 DIAGNOSIS — Z96652 Presence of left artificial knee joint: Secondary | ICD-10-CM | POA: Diagnosis not present

## 2015-06-18 DIAGNOSIS — M25562 Pain in left knee: Secondary | ICD-10-CM | POA: Diagnosis not present

## 2015-06-18 DIAGNOSIS — M1712 Unilateral primary osteoarthritis, left knee: Secondary | ICD-10-CM | POA: Diagnosis not present

## 2015-06-18 DIAGNOSIS — R262 Difficulty in walking, not elsewhere classified: Secondary | ICD-10-CM | POA: Diagnosis not present

## 2015-06-18 DIAGNOSIS — R531 Weakness: Secondary | ICD-10-CM | POA: Diagnosis not present

## 2015-06-23 DIAGNOSIS — R531 Weakness: Secondary | ICD-10-CM | POA: Diagnosis not present

## 2015-06-23 DIAGNOSIS — M1712 Unilateral primary osteoarthritis, left knee: Secondary | ICD-10-CM | POA: Diagnosis not present

## 2015-06-23 DIAGNOSIS — R262 Difficulty in walking, not elsewhere classified: Secondary | ICD-10-CM | POA: Diagnosis not present

## 2015-06-23 DIAGNOSIS — M25562 Pain in left knee: Secondary | ICD-10-CM | POA: Diagnosis not present

## 2015-06-25 DIAGNOSIS — M25562 Pain in left knee: Secondary | ICD-10-CM | POA: Diagnosis not present

## 2015-06-25 DIAGNOSIS — R262 Difficulty in walking, not elsewhere classified: Secondary | ICD-10-CM | POA: Diagnosis not present

## 2015-06-25 DIAGNOSIS — M1712 Unilateral primary osteoarthritis, left knee: Secondary | ICD-10-CM | POA: Diagnosis not present

## 2015-06-25 DIAGNOSIS — R531 Weakness: Secondary | ICD-10-CM | POA: Diagnosis not present

## 2015-06-30 DIAGNOSIS — R262 Difficulty in walking, not elsewhere classified: Secondary | ICD-10-CM | POA: Diagnosis not present

## 2015-06-30 DIAGNOSIS — M1712 Unilateral primary osteoarthritis, left knee: Secondary | ICD-10-CM | POA: Diagnosis not present

## 2015-06-30 DIAGNOSIS — M25562 Pain in left knee: Secondary | ICD-10-CM | POA: Diagnosis not present

## 2015-06-30 DIAGNOSIS — R531 Weakness: Secondary | ICD-10-CM | POA: Diagnosis not present

## 2015-07-02 DIAGNOSIS — Z0000 Encounter for general adult medical examination without abnormal findings: Secondary | ICD-10-CM | POA: Diagnosis not present

## 2015-07-02 DIAGNOSIS — N403 Nodular prostate with lower urinary tract symptoms: Secondary | ICD-10-CM | POA: Diagnosis not present

## 2015-07-02 DIAGNOSIS — R972 Elevated prostate specific antigen [PSA]: Secondary | ICD-10-CM | POA: Diagnosis not present

## 2015-07-02 DIAGNOSIS — C61 Malignant neoplasm of prostate: Secondary | ICD-10-CM | POA: Diagnosis not present

## 2015-07-07 DIAGNOSIS — M25562 Pain in left knee: Secondary | ICD-10-CM | POA: Diagnosis not present

## 2015-07-07 DIAGNOSIS — R262 Difficulty in walking, not elsewhere classified: Secondary | ICD-10-CM | POA: Diagnosis not present

## 2015-07-07 DIAGNOSIS — R531 Weakness: Secondary | ICD-10-CM | POA: Diagnosis not present

## 2015-07-07 DIAGNOSIS — M1712 Unilateral primary osteoarthritis, left knee: Secondary | ICD-10-CM | POA: Diagnosis not present

## 2015-07-14 DIAGNOSIS — R531 Weakness: Secondary | ICD-10-CM | POA: Diagnosis not present

## 2015-07-14 DIAGNOSIS — M1712 Unilateral primary osteoarthritis, left knee: Secondary | ICD-10-CM | POA: Diagnosis not present

## 2015-07-14 DIAGNOSIS — M25562 Pain in left knee: Secondary | ICD-10-CM | POA: Diagnosis not present

## 2015-07-14 DIAGNOSIS — R262 Difficulty in walking, not elsewhere classified: Secondary | ICD-10-CM | POA: Diagnosis not present

## 2015-07-15 DIAGNOSIS — M1712 Unilateral primary osteoarthritis, left knee: Secondary | ICD-10-CM | POA: Diagnosis not present

## 2015-10-30 DIAGNOSIS — C61 Malignant neoplasm of prostate: Secondary | ICD-10-CM | POA: Diagnosis not present

## 2015-10-30 DIAGNOSIS — Z0000 Encounter for general adult medical examination without abnormal findings: Secondary | ICD-10-CM | POA: Diagnosis not present

## 2015-10-30 DIAGNOSIS — I10 Essential (primary) hypertension: Secondary | ICD-10-CM | POA: Diagnosis not present

## 2015-10-30 DIAGNOSIS — D649 Anemia, unspecified: Secondary | ICD-10-CM | POA: Diagnosis not present

## 2015-10-30 DIAGNOSIS — Z1211 Encounter for screening for malignant neoplasm of colon: Secondary | ICD-10-CM | POA: Diagnosis not present

## 2015-10-30 DIAGNOSIS — R8290 Unspecified abnormal findings in urine: Secondary | ICD-10-CM | POA: Diagnosis not present

## 2015-10-30 DIAGNOSIS — R55 Syncope and collapse: Secondary | ICD-10-CM | POA: Diagnosis not present

## 2015-10-30 DIAGNOSIS — E785 Hyperlipidemia, unspecified: Secondary | ICD-10-CM | POA: Diagnosis not present

## 2015-12-19 DIAGNOSIS — R001 Bradycardia, unspecified: Secondary | ICD-10-CM | POA: Diagnosis not present

## 2015-12-19 DIAGNOSIS — R0602 Shortness of breath: Secondary | ICD-10-CM | POA: Diagnosis not present

## 2015-12-19 DIAGNOSIS — I10 Essential (primary) hypertension: Secondary | ICD-10-CM | POA: Diagnosis not present

## 2015-12-19 DIAGNOSIS — N289 Disorder of kidney and ureter, unspecified: Secondary | ICD-10-CM | POA: Diagnosis not present

## 2015-12-23 ENCOUNTER — Other Ambulatory Visit: Payer: Self-pay | Admitting: Family Medicine

## 2015-12-23 ENCOUNTER — Ambulatory Visit
Admission: RE | Admit: 2015-12-23 | Discharge: 2015-12-23 | Disposition: A | Discharge status: Home / Self Care | Payer: Medicare Other | Source: Ambulatory Visit | Attending: Family Medicine | Admitting: Family Medicine

## 2015-12-23 ENCOUNTER — Telehealth: Payer: Self-pay | Admitting: Cardiovascular Disease

## 2015-12-23 DIAGNOSIS — R0602 Shortness of breath: Secondary | ICD-10-CM | POA: Diagnosis not present

## 2015-12-23 NOTE — Telephone Encounter (Signed)
Received records from [facility] for appointment on 01/14/16 with Dr Oval Linsey.  Records given to [facility] (medical records) for Dr Blenda Mounts schedule on 01/14/16. lp

## 2016-01-12 ENCOUNTER — Other Ambulatory Visit: Payer: Self-pay | Admitting: Nephrology

## 2016-01-12 DIAGNOSIS — D631 Anemia in chronic kidney disease: Secondary | ICD-10-CM | POA: Diagnosis not present

## 2016-01-12 DIAGNOSIS — N183 Chronic kidney disease, stage 3 (moderate): Secondary | ICD-10-CM | POA: Diagnosis not present

## 2016-01-12 DIAGNOSIS — N182 Chronic kidney disease, stage 2 (mild): Secondary | ICD-10-CM | POA: Diagnosis not present

## 2016-01-12 DIAGNOSIS — N2581 Secondary hyperparathyroidism of renal origin: Secondary | ICD-10-CM | POA: Diagnosis not present

## 2016-01-12 DIAGNOSIS — I10 Essential (primary) hypertension: Secondary | ICD-10-CM | POA: Diagnosis not present

## 2016-01-14 ENCOUNTER — Ambulatory Visit: Payer: Medicare Other | Admitting: Cardiovascular Disease

## 2016-01-14 DIAGNOSIS — C61 Malignant neoplasm of prostate: Secondary | ICD-10-CM | POA: Diagnosis not present

## 2016-01-20 ENCOUNTER — Ambulatory Visit (INDEPENDENT_AMBULATORY_CARE_PROVIDER_SITE_OTHER): Payer: Medicare Other | Admitting: Internal Medicine

## 2016-01-20 ENCOUNTER — Encounter: Payer: Self-pay | Admitting: Internal Medicine

## 2016-01-20 VITALS — BP 118/74 | HR 70 | Ht 72.0 in | Wt 201.0 lb

## 2016-01-20 DIAGNOSIS — R0600 Dyspnea, unspecified: Secondary | ICD-10-CM | POA: Diagnosis not present

## 2016-01-20 NOTE — Progress Notes (Signed)
Subjective:    Patient ID: George Oto., male    DOB: 1945/08/16,   MRN: [id number]  HPI   60 yobm quit smoking 2005 referred to pulmonary clinic 01/20/2016 by Dr   Leia Alf re abn cxr/ ? Copd?    01/20/2016 1st [facility] office visit/ Latrenda Irani   Chief Complaint  Patient presents with  . Pulmonary Consult    Referred by Dr. London Pepper. Pt states that he gets SOB with "extreme exertion" for the past 6 months.   hauling trash  / picking up anything heavy causes sob but last time it didn't was around 2009  [place] the country up to a mile and half s stopping x 29 min/ sometimes knee stops/ pace is faster than his walking companion who "slows him down"    No obvious day to day or daytime variability or assoc excess/ purulent sputum or mucus plugs or hemoptysis or cp or chest tightness, subjective wheeze or overt sinus or hb symptoms. No unusual exp hx or h/o childhood pna/ asthma or knowledge of premature birth.  Sleeping ok without nocturnal  or early am exacerbation  of respiratory  c/o's or need for noct saba. Also denies any obvious fluctuation of symptoms with weather or environmental changes or other aggravating or alleviating factors except as outlined above   Current Medications, Allergies, Complete Past Medical History, Past Surgical History, Family History, and Social History were reviewed in [employer] record.       Review of Systems  Constitutional: Negative for activity change, appetite change, chills, fever and unexpected weight change.  HENT: Negative for congestion, dental problem, postnasal drip, rhinorrhea, sneezing, sore throat, trouble swallowing and voice change.   Eyes: Negative for visual disturbance.  Respiratory: Negative for cough and choking.   Cardiovascular: Negative for chest pain and leg swelling.  Gastrointestinal: Negative for abdominal pain, nausea and vomiting.  Genitourinary: Negative for difficulty  urinating.  Musculoskeletal: Negative for arthralgias.  Skin: Negative for rash.  Psychiatric/Behavioral: Negative for behavioral problems and confusion.       Objective:   Physical Exam   amb talkative bm very easily aggravated trying to express what activites consist of "extreme exertion" and cause sob    Wt Readings from Last 3 Encounters:  01/20/16 201 lb (91.2 kg)  04/21/15 191 lb (86.6 kg)  04/11/15 191 lb 12.8 oz (87 kg)    Vital signs reviewed  - Note on arrival 02 sats  99% on RA    HEENT: nl  turbinates, and oropharynx. Nl external ear canals without cough reflex - full dentures   NECK :  without JVD/Nodes/TM/ nl carotid upstrokes bilaterally   LUNGS: no acc muscle use,  Nl contour chest which is clear to A and P bilaterally without cough on insp or exp maneuvers   CV:  RRR  no s3 or murmur or increase in P2, no edema   ABD:  soft and nontender with nl inspiratory excursion in the supine position. No bruits or organomegaly, bowel sounds nl  MS:  Nl gait/ ext warm without deformities, calf tenderness, cyanosis or clubbing No obvious joint restrictions   SKIN: warm and dry without lesions    NEURO:  alert, approp, nl sensorium with  no motor deficits      CXR PA and Lateral:   01/20/2016 :    I personally reviewed images and agree with radiology impression as follows:    Mild hyperinflation may be voluntary  or may reflect reactive airway disease or COPD. There is no evidence of pneumonia nor CHF. There are no findings suspicious for metastatic disease.     Assessment & Plan:

## 2016-01-20 NOTE — Patient Instructions (Signed)
You do not significant copd and very unlikely you ever will   If your breathing starts to get worse please call to schedule CPST > call Libby at [phone]

## 2016-01-21 ENCOUNTER — Other Ambulatory Visit: Payer: Medicare Other

## 2016-01-21 NOTE — Assessment & Plan Note (Signed)
Spirometry 01/20/2016  FEV1 3.00 (97%)  Ratio 74  With very minimal curvature of f/v loop   Clearly he does not have significant copd with hyperinfation on cxr only mild and c/w age 70 changes in elastic recoil than are expected but not likely to cause enough dynamic hyperinflation to be significant.  He had a very tough time answering the question:  "when is the last time you were able to do extreme exertion s sob" and became very frustrated with the question and finally admitted it was 2009 and that he's now attempting things he hasn't.  His labs from Dr Donato Heinz office are nl but do not include TSH or BNP which we normally do to complete the w/u, but I think they will likely be nl too  For now rec regular ex/ reassurance and if still an issue with sob with "extreme exertion" then I gave him  the number to schedule it.   Total time devoted to counseling  = 35/63m review case with pt/ discussion of options/alternatives/ personally creating written instructions  in presence of pt  then going over those specific  Instructions directly with the pt including how to use all of the meds but in particular covering each new medication in detail and the difference between the maintenance/automatic meds and the prns using an action plan format for the latter.

## 2016-04-02 DIAGNOSIS — Z85038 Personal history of other malignant neoplasm of large intestine: Secondary | ICD-10-CM | POA: Diagnosis not present

## 2016-04-02 DIAGNOSIS — Z980 Intestinal bypass and anastomosis status: Secondary | ICD-10-CM | POA: Diagnosis not present

## 2016-04-08 DIAGNOSIS — M25561 Pain in right knee: Secondary | ICD-10-CM | POA: Diagnosis not present

## 2016-04-08 DIAGNOSIS — M1712 Unilateral primary osteoarthritis, left knee: Secondary | ICD-10-CM | POA: Diagnosis not present

## 2016-04-21 ENCOUNTER — Ambulatory Visit
Admission: RE | Admit: 2016-04-21 | Discharge: 2016-04-21 | Disposition: A | Discharge status: Home / Self Care | Payer: Medicare Other | Source: Ambulatory Visit | Attending: Nephrology | Admitting: Nephrology

## 2016-04-21 DIAGNOSIS — N183 Chronic kidney disease, stage 3 (moderate): Secondary | ICD-10-CM | POA: Diagnosis not present

## 2016-04-21 DIAGNOSIS — N182 Chronic kidney disease, stage 2 (mild): Secondary | ICD-10-CM

## 2016-05-13 DIAGNOSIS — M179 Osteoarthritis of knee, unspecified: Secondary | ICD-10-CM | POA: Diagnosis not present

## 2016-05-13 DIAGNOSIS — I10 Essential (primary) hypertension: Secondary | ICD-10-CM | POA: Diagnosis not present

## 2016-05-13 DIAGNOSIS — Z23 Encounter for immunization: Secondary | ICD-10-CM | POA: Diagnosis not present

## 2016-05-13 DIAGNOSIS — Z01818 Encounter for other preprocedural examination: Secondary | ICD-10-CM | POA: Diagnosis not present

## 2016-05-19 ENCOUNTER — Encounter (HOSPITAL_COMMUNITY): Payer: Self-pay | Admitting: Physician Assistant

## 2016-05-19 DIAGNOSIS — M1711 Unilateral primary osteoarthritis, right knee: Secondary | ICD-10-CM

## 2016-05-19 DIAGNOSIS — M1712 Unilateral primary osteoarthritis, left knee: Secondary | ICD-10-CM | POA: Diagnosis not present

## 2016-05-19 DIAGNOSIS — Z96652 Presence of left artificial knee joint: Secondary | ICD-10-CM

## 2016-05-19 DIAGNOSIS — N289 Disorder of kidney and ureter, unspecified: Secondary | ICD-10-CM | POA: Diagnosis not present

## 2016-05-19 HISTORY — DX: Unilateral primary osteoarthritis, right knee: M17.11

## 2016-05-19 HISTORY — DX: Presence of left artificial knee joint: Z96.652

## 2016-05-19 NOTE — H&P (Signed)
TOTAL KNEE ADMISSION H&P  Patient is being admitted for right total knee arthroplasty.  Subjective:  Chief Complaint:right knee pain.  HPI: George [name]., 71 y.o. male, has a history of pain and functional disability in the right knee due to arthritis and has failed non-surgical conservative treatments for greater than 12 weeks to includeNSAID's and/or analgesics, corticosteriod injections, viscosupplementation injections, flexibility and strengthening excercises, supervised PT with diminished ADL's post treatment, use of assistive devices and activity modification.  Onset of symptoms was gradual, starting 10 years ago with gradually worsening course since that time. The patient noted no past surgery on the right knee(s).  Patient currently rates pain in the right knee(s) at 10 out of 10 with activity. Patient has night pain, worsening of pain with activity and weight bearing, pain that interferes with activities of daily living, crepitus and joint swelling.  Patient has evidence of subchondral sclerosis, periarticular osteophytes and joint space narrowing by imaging studies. . There is no active infection.  Patient Active Problem List   Diagnosis Date Noted  . Primary localized osteoarthrosis of the knee, right 05/19/2016  . S/P total knee arthroplasty, left 05/19/2016  . Dyspnea 01/20/2016  . DJD (degenerative joint disease) of knee 04/21/2015  . Primary localized osteoarthritis of left knee 04/09/2015  . Generalized anxiety disorder 04/09/2015  . Panic attacks 04/09/2015  . Spermatocele 05/08/2014  . Benign localized hyperplasia of prostate with urinary obstruction 05/07/2014  . Malignant neoplasm of prostate ([facility]) 04/22/2014   Past Medical History:  Diagnosis Date  . Anxiety   . Arthritis    OSTEO IN KNEE  . Colon cancer ([facility])   . Depression   . Elevated prostate specific antigen (PSA)   . Generalized anxiety disorder 04/09/2015  . Hematospermia   . Hx antineoplastic  chemotherapy 2010  . Hypertension   . 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 Incomplete bladder emptying   . Malignant neoplasm of prostate ([facility])   . Nodular prostate with lower urinary tract symptoms   . Panic attacks 04/09/2015  . Primary localized osteoarthrosis of the knee, right 05/19/2016  . Prostatitis   . S/P total knee arthroplasty, left 05/19/2016  . Spermatocele   . Weak urinary stream     Past Surgical History:  Procedure Laterality Date  . COLON RESECTION   NOV 2009  . PROSTATE BIOPSY    . SPERMATOCELECTOMY Left 05/07/2014   Procedure: LEFT SPERMATOCELECTOMY;  Surgeon: Malka So, MD;  Location: [facility];  Service: Urology;  Laterality: Left;  . TOTAL KNEE ARTHROPLASTY Left 04/21/2015   Procedure: LEFT TOTAL KNEE ARTHROPLASTY;  Surgeon: Elsie Saas, MD;  Location: [facility];  Service: Orthopedics;  Laterality: Left;  . TRANSURETHRAL RESECTION OF PROSTATE N/A 05/07/2014   Procedure: TRANSURETHRAL RESECTION OF THE PROSTATE WITH GYRUS INSTRUMENTS;  Surgeon: Malka So, MD;  Location: [facility];  Service: Urology;  Laterality: N/A;    No current facility-administered medications for this encounter.   Current Outpatient Prescriptions:  .  atorvastatin (LIPITOR) 20 MG tablet, Take 20 mg by mouth daily., Disp: , Rfl:  .  buPROPion (WELLBUTRIN XL) 150 MG 24 hr tablet, Take 450 mg by mouth every morning. , Disp: , Rfl:  .  felodipine (PLENDIL) 10 MG 24 hr tablet, Take 1 tablet by mouth daily., Disp: , Rfl:  .  losartan (COZAAR) 100 MG tablet, Take 100 mg by mouth daily., Disp: , Rfl:  .  metoprolol succinate (TOPROL-XL)  25 MG 24 hr tablet, Take 25 mg by mouth daily., Disp: , Rfl:  .  potassium chloride SA (K-DUR,KLOR-CON) 20 MEQ tablet, Take 40 mEq by mouth daily. , Disp: , Rfl:    Allergies  Allergen Reactions  . Amlodipine     Dizziness     Social History  Substance Use Topics  . Smoking status: Former Smoker    Packs/day: 0.25    Years: 3.00     Types: Cigarettes, Pipe    Quit date: 04/13/2003  . Smokeless tobacco: Never Used     Comment: smoked 1 cigarette per day SOME DAYS  . Alcohol use 0.0 oz/week     Comment: OCCASIONAL    Family History  Problem Relation Age of Onset  . Stroke Father   . Cancer Brother     neoplasm of brain     Review of Systems  Constitutional: Negative.   HENT: Negative.   Eyes: Negative.   Respiratory: Negative.   Cardiovascular: Negative.   Gastrointestinal: Negative.   Genitourinary: Negative.   Musculoskeletal: Positive for back pain and joint pain.  Skin: Negative.   Neurological: Negative.   Endo/Heme/Allergies: Negative.   Psychiatric/Behavioral: Negative.     Objective:  Physical Exam  Constitutional: He is oriented to person, place, and time. He appears well-developed and well-nourished.  HENT:  Head: Normocephalic and atraumatic.  Mouth/Throat: Oropharynx is clear and moist.  Eyes: Conjunctivae and EOM are normal. Pupils are equal, round, and reactive to light.  Neck: Neck supple.  Cardiovascular: Normal rate and regular rhythm.   Respiratory: Effort normal and breath sounds normal.  GI: Soft. Bowel sounds are normal.  Genitourinary:  Genitourinary Comments: Not pertinent to current symptomatology therefore not examined.  Musculoskeletal:  Examination of his right knee reveals pain medially and laterally.  1+ crepitation.  1+ synovitis.  Range of motion -10 to 120 degrees.  Knee is stable with normal patella tracking.  Examination of the left knee reveals well healed total knee incision without swelling or pain.  Range of motion 0-120 degrees.  Knee is stable with normal patella tracking.  Vascular exam: Pulses are 2+ and symmetric.  Neurologic exam: Distal motor and sensory examination is within normal limits.  Neurological: He is alert and oriented to person, place, and time.  Skin: Skin is warm and dry.  Psychiatric: He has a normal mood and affect. His behavior is normal.     Vital signs in last 24 hours: Temp:  [97.4 F (36.3 C)] 97.4 F (36.3 C) (02/07 1400) Pulse Rate:  [82] 82 (02/07 1400) BP: (156)/(100) 156/100 (02/07 1400) SpO2:  [98 %] 98 % (02/07 1400) Weight:  [88.9 kg (196 lb)] 88.9 kg (196 lb) (02/07 1400)  Labs:   Estimated body mass index is 27.34 kg/m as calculated from the following:   Height as of this encounter: 5\' 11"  (1.803 m).   Weight as of this encounter: 88.9 kg (196 lb).   Imaging Review Plain radiographs demonstrate severe degenerative joint disease of the right knee(s). The overall alignment issignificant varus. The bone quality appears to be good for age and reported activity level.  Assessment/Plan:  End stage arthritis, right knee  Principal Problem:   Primary localized osteoarthrosis of the knee, right Active Problems:   Malignant neoplasm of prostate ([facility])   Spermatocele   Generalized anxiety disorder   Panic attacks   S/P total knee arthroplasty, left  Right knee has significant flexion contracture The patient history, physical examination, clinical judgment  of the provider and imaging studies are consistent with end stage degenerative joint disease of the right knee(s) and total knee arthroplasty is deemed medically necessary. The treatment options including medical management, injection therapy arthroscopy and arthroplasty were discussed at length. The risks and benefits of total knee arthroplasty were presented and reviewed. The risks due to aseptic loosening, infection, stiffness, patella tracking problems, thromboembolic complications and other imponderables were discussed. The patient acknowledged the explanation, agreed to proceed with the plan and consent was signed. Patient is being admitted for inpatient treatment for surgery, pain control, PT, OT, prophylactic antibiotics, VTE prophylaxis, progressive ambulation and ADL's and discharge planning. The patient is planning to be discharged home with home  health services

## 2016-05-20 ENCOUNTER — Encounter (HOSPITAL_COMMUNITY): Payer: Self-pay

## 2016-05-20 ENCOUNTER — Encounter (HOSPITAL_COMMUNITY)
Admission: RE | Admit: 2016-05-20 | Discharge: 2016-05-20 | Disposition: A | Discharge status: Home / Self Care | Payer: Medicare Other | Source: Ambulatory Visit | Attending: Orthopedic Surgery | Admitting: Orthopedic Surgery

## 2016-05-20 DIAGNOSIS — I10 Essential (primary) hypertension: Secondary | ICD-10-CM | POA: Insufficient documentation

## 2016-05-20 DIAGNOSIS — Z01812 Encounter for preprocedural laboratory examination: Secondary | ICD-10-CM | POA: Diagnosis not present

## 2016-05-20 DIAGNOSIS — Z01810 Encounter for preprocedural cardiovascular examination: Secondary | ICD-10-CM | POA: Diagnosis not present

## 2016-05-20 HISTORY — DX: Unspecified asthma, uncomplicated: J45.909

## 2016-05-20 LAB — COMPREHENSIVE METABOLIC PANEL
ALK PHOS: 83 U/L (ref 38–126)
ALT: 18 U/L (ref 17–63)
ANION GAP: 7 (ref 5–15)
AST: 28 U/L (ref 15–41)
Albumin: 4 g/dL (ref 3.5–5.0)
BUN: 9 mg/dL (ref 6–20)
CALCIUM: 9.3 mg/dL (ref 8.9–10.3)
CO2: 25 mmol/L (ref 22–32)
Chloride: 108 mmol/L (ref 101–111)
Creatinine, Ser: 1.28 mg/dL — ABNORMAL HIGH (ref 0.61–1.24)
GFR calc non Af Amer: 54 mL/min — ABNORMAL LOW (ref >60)
GLUCOSE: 98 mg/dL (ref 65–99)
Potassium: 4 mmol/L (ref 3.5–5.1)
SODIUM: 140 mmol/L (ref 135–145)
TOTAL PROTEIN: 7.4 g/dL (ref 6.5–8.1)
Total Bilirubin: 0.9 mg/dL (ref 0.3–1.2)

## 2016-05-20 LAB — SURGICAL PCR SCREEN
MRSA, PCR: NEGATIVE
STAPHYLOCOCCUS AUREUS: NEGATIVE

## 2016-05-20 LAB — PROTIME-INR
INR: 0.98
Prothrombin Time: 12.9 seconds (ref 11.4–15.2)

## 2016-05-20 LAB — CBC WITH DIFFERENTIAL/PLATELET
Basophils Absolute: 0 10*3/uL (ref 0.0–0.1)
Basophils Relative: 1 %
Eosinophils Absolute: 0.3 10*3/uL (ref 0.0–0.7)
Eosinophils Relative: 5 %
HEMATOCRIT: 41.5 % (ref 39.0–52.0)
HEMOGLOBIN: 13.3 g/dL (ref 13.0–17.0)
LYMPHS ABS: 2 10*3/uL (ref 0.7–4.0)
Lymphocytes Relative: 28 %
MCH: 28.2 pg (ref 26.0–34.0)
MCHC: 32 g/dL (ref 30.0–36.0)
MCV: 88.1 fL (ref 78.0–100.0)
MONOS PCT: 6 %
Monocytes Absolute: 0.4 10*3/uL (ref 0.1–1.0)
NEUTROS ABS: 4.5 10*3/uL (ref 1.7–7.7)
NEUTROS PCT: 62 %
Platelets: 313 10*3/uL (ref 150–400)
RBC: 4.71 MIL/uL (ref 4.22–5.81)
RDW: 13.7 % (ref 11.5–15.5)
WBC: 7.3 10*3/uL (ref 4.0–10.5)

## 2016-05-20 LAB — APTT: aPTT: 29 seconds (ref 24–36)

## 2016-05-20 LAB — TYPE AND SCREEN
ABO/RH(D): B POS
Antibody Screen: NEGATIVE

## 2016-05-20 NOTE — Pre-Procedure Instructions (Signed)
George [name].  05/20/2016      [facility] ([facility] [id number] [place] ([place]) [place] [address] Phone: [phone] Fax: [phone]  [facility] - [place], [place] [address] Phone: [phone] Fax: [phone]    Your procedure is scheduled on Feb 19  Report to [facility] at 1045 A.M.  Call this number if you have problems the morning of surgery:  [phone]   Remember:  Do not eat food or drink liquids after midnight.  Take these medicines the morning of surgery with A SIP OF WATER Metoprolol Succinate (Toprol-xl), felodipine (Plendil), bupropion (Wellbutrin XL)   Stop taking aspirin, BC's, Goody's, Herbal medications, Fish Oil, Ibuprofen, Advil, Motrin, Vitamins   Do not wear jewelry, make-up or nail polish.  Do not wear lotions, powders, or perfumes, or deoderant.  Do not shave 48 hours prior to surgery.  Men may shave face and neck.  Do not bring valuables to the hospital.  [facility] is not responsible for any belongings or valuables.  Contacts, dentures or bridgework may not be worn into surgery.  Leave your suitcase in the car.  After surgery it may be brought to your room.  For patients admitted to the hospital, discharge time will be determined by your treatment team.  Patients discharged the day of surgery will not be allowed to drive home.    Special instructions:  [facility] - Preparing for Surgery  Before surgery, you can play an important role.  Because skin is not sterile, your skin needs to be as free of germs as possible.  You can reduce the number of germs on you skin by washing with CHG (chlorahexidine gluconate) soap before surgery.  CHG is an antiseptic cleaner which kills germs and bonds with the skin to continue killing germs even after washing.  Please DO NOT use if you have an allergy to CHG or  antibacterial soaps.  If your skin becomes reddened/irritated stop using the CHG and inform your nurse when you arrive at Short Stay.  Do not shave (including legs and underarms) for at least 48 hours prior to the first CHG shower.  You may shave your face.  Please follow these instructions carefully:   1.  Shower with CHG Soap the night before surgery and the   morning of Surgery.  2.  If you choose to wash your hair, wash your hair first as usual with your  normal shampoo.  3.  After you shampoo, rinse your hair and body thoroughly to remove the  Shampoo.  4.  Use CHG as you would any other liquid soap.  You can apply chg directly  to the skin and wash gently with scrungie or a clean washcloth.  5.  Apply the CHG Soap to your body ONLY FROM THE NECK DOWN.   Do not use on open wounds or open sores.  Avoid contact with your eyes,  ears, mouth and genitals (private parts).  Wash genitals (private parts)  with your normal soap.  6.  Wash thoroughly, paying special attention to the area where your surgery will be performed.  7.  Thoroughly rinse your body with warm water from the neck down.  8.  DO NOT shower/wash with your normal soap after using and rinsing off  the CHG Soap.  9.  Pat yourself dry with a clean towel.  10.  Wear clean pajamas.            11.  Place clean sheets on your bed the night of your first shower and do not  sleep with pets.  Day of Surgery  Do not apply any lotions/deoderants the morning of surgery.  Please wear clean clothes to the hospital/surgery center.     Please read over the following fact sheets that you were given. Pain Booklet, Coughing and Deep Breathing, MRSA Information and Surgical Site Infection Prevention

## 2016-05-20 NOTE — Progress Notes (Signed)
PCp is Dr. London Pepper Denies ever seeing a cardiologist. Denies andy chest pain. Denies ever having a card cath, Stress test, or echo.

## 2016-05-21 DIAGNOSIS — N2581 Secondary hyperparathyroidism of renal origin: Secondary | ICD-10-CM | POA: Diagnosis not present

## 2016-05-21 DIAGNOSIS — I10 Essential (primary) hypertension: Secondary | ICD-10-CM | POA: Diagnosis not present

## 2016-05-21 DIAGNOSIS — N182 Chronic kidney disease, stage 2 (mild): Secondary | ICD-10-CM | POA: Diagnosis not present

## 2016-05-21 DIAGNOSIS — D631 Anemia in chronic kidney disease: Secondary | ICD-10-CM | POA: Diagnosis not present

## 2016-05-21 LAB — URINE CULTURE: CULTURE: NO GROWTH

## 2016-05-28 MED ORDER — TRANEXAMIC ACID 1000 MG/10ML IV SOLN
1000.0000 mg | INTRAVENOUS | Status: AC
  Administered 2016-05-31: 1000 mg via INTRAVENOUS
  Filled 2016-05-28: qty 10

## 2016-05-31 ENCOUNTER — Inpatient Hospital Stay (HOSPITAL_COMMUNITY): Payer: Medicare Other | Admitting: Certified Registered Nurse Anesthetist

## 2016-05-31 ENCOUNTER — Encounter (HOSPITAL_COMMUNITY): Payer: Self-pay | Admitting: Certified Registered Nurse Anesthetist

## 2016-05-31 ENCOUNTER — Encounter (HOSPITAL_COMMUNITY)
Admission: RE | Disposition: A | Discharge status: Home Health | Payer: Self-pay | Source: Ambulatory Visit | Attending: Orthopedic Surgery

## 2016-05-31 ENCOUNTER — Inpatient Hospital Stay (HOSPITAL_COMMUNITY)
Admission: RE | Admit: 2016-05-31 | Discharge: 2016-06-02 | DRG: 470 | Disposition: A | Discharge status: Home Health | Payer: Medicare Other | Source: Ambulatory Visit | Attending: Orthopedic Surgery | Admitting: Orthopedic Surgery

## 2016-05-31 DIAGNOSIS — Z823 Family history of stroke: Secondary | ICD-10-CM | POA: Diagnosis not present

## 2016-05-31 DIAGNOSIS — Z87891 Personal history of nicotine dependence: Secondary | ICD-10-CM

## 2016-05-31 DIAGNOSIS — Z79899 Other long term (current) drug therapy: Secondary | ICD-10-CM | POA: Diagnosis not present

## 2016-05-31 DIAGNOSIS — I10 Essential (primary) hypertension: Secondary | ICD-10-CM | POA: Diagnosis not present

## 2016-05-31 DIAGNOSIS — M1711 Unilateral primary osteoarthritis, right knee: Principal | ICD-10-CM | POA: Diagnosis present

## 2016-05-31 DIAGNOSIS — Z96652 Presence of left artificial knee joint: Secondary | ICD-10-CM

## 2016-05-31 DIAGNOSIS — F410 Panic disorder [episodic paroxysmal anxiety] without agoraphobia: Secondary | ICD-10-CM | POA: Diagnosis not present

## 2016-05-31 DIAGNOSIS — N4340 Spermatocele of epididymis, unspecified: Secondary | ICD-10-CM | POA: Diagnosis present

## 2016-05-31 DIAGNOSIS — F411 Generalized anxiety disorder: Secondary | ICD-10-CM | POA: Diagnosis not present

## 2016-05-31 DIAGNOSIS — M25761 Osteophyte, right knee: Secondary | ICD-10-CM | POA: Diagnosis present

## 2016-05-31 DIAGNOSIS — C61 Malignant neoplasm of prostate: Secondary | ICD-10-CM | POA: Diagnosis present

## 2016-05-31 DIAGNOSIS — Z85038 Personal history of other malignant neoplasm of large intestine: Secondary | ICD-10-CM | POA: Diagnosis not present

## 2016-05-31 DIAGNOSIS — Z8546 Personal history of malignant neoplasm of prostate: Secondary | ICD-10-CM | POA: Diagnosis not present

## 2016-05-31 DIAGNOSIS — Z808 Family history of malignant neoplasm of other organs or systems: Secondary | ICD-10-CM | POA: Diagnosis not present

## 2016-05-31 DIAGNOSIS — G8918 Other acute postprocedural pain: Secondary | ICD-10-CM | POA: Diagnosis not present

## 2016-05-31 HISTORY — DX: Presence of left artificial knee joint: Z96.652

## 2016-05-31 HISTORY — PX: TOTAL KNEE ARTHROPLASTY: SHX125

## 2016-05-31 HISTORY — DX: Unilateral primary osteoarthritis, right knee: M17.11

## 2016-05-31 SURGERY — ARTHROPLASTY, KNEE, TOTAL
Anesthesia: Spinal | Laterality: Right

## 2016-05-31 MED ORDER — LACTATED RINGERS IV SOLN
INTRAVENOUS | Status: DC
  Administered 2016-05-31 (×3): via INTRAVENOUS

## 2016-05-31 MED ORDER — LACTATED RINGERS IV SOLN
INTRAVENOUS | Status: DC
Start: 2016-05-31 — End: 2016-05-31

## 2016-05-31 MED ORDER — SODIUM CHLORIDE 0.9 % IR SOLN
Status: DC | PRN
  Administered 2016-05-31: 3000 mL

## 2016-05-31 MED ORDER — PROMETHAZINE HCL 25 MG/ML IJ SOLN: 6.2500 mg | INTRAMUSCULAR | Status: DC | PRN

## 2016-05-31 MED ORDER — DIPHENHYDRAMINE HCL 12.5 MG/5ML PO ELIX: 12.5000 mg | ORAL_SOLUTION | ORAL | Status: DC | PRN

## 2016-05-31 MED ORDER — EPINEPHRINE PF 1 MG/ML IJ SOLN
INTRAMUSCULAR | Status: AC
  Filled 2016-05-31: qty 1

## 2016-05-31 MED ORDER — METOCLOPRAMIDE HCL 5 MG/ML IJ SOLN: 5.0000 mg | Freq: Three times a day (TID) | INTRAMUSCULAR | Status: DC | PRN

## 2016-05-31 MED ORDER — ACETAMINOPHEN 650 MG RE SUPP: 650.0000 mg | Freq: Four times a day (QID) | RECTAL | Status: DC | PRN

## 2016-05-31 MED ORDER — OXYCODONE HCL 5 MG PO TABS
5.0000 mg | ORAL_TABLET | ORAL | Status: DC | PRN
  Administered 2016-05-31 – 2016-06-01 (×4): 10 mg via ORAL
  Filled 2016-05-31 (×4): qty 2

## 2016-05-31 MED ORDER — ACETAMINOPHEN 325 MG PO TABS: 650.0000 mg | ORAL_TABLET | Freq: Four times a day (QID) | ORAL | Status: DC | PRN

## 2016-05-31 MED ORDER — PHENYLEPHRINE HCL 10 MG/ML IJ SOLN
INTRAVENOUS | Status: DC | PRN
  Administered 2016-05-31: 25 ug/min via INTRAVENOUS

## 2016-05-31 MED ORDER — PROPOFOL 10 MG/ML IV BOLUS
INTRAVENOUS | Status: AC
  Filled 2016-05-31: qty 20

## 2016-05-31 MED ORDER — CEFAZOLIN SODIUM-DEXTROSE 2-4 GM/100ML-% IV SOLN
2.0000 g | INTRAVENOUS | Status: AC
  Administered 2016-05-31: 2 g via INTRAVENOUS
  Filled 2016-05-31: qty 100

## 2016-05-31 MED ORDER — CHLORHEXIDINE GLUCONATE 4 % EX LIQD: 60.0000 mL | Freq: Once | CUTANEOUS | Status: DC

## 2016-05-31 MED ORDER — BUPIVACAINE IN DEXTROSE 0.75-8.25 % IT SOLN
INTRATHECAL | Status: DC | PRN
  Administered 2016-05-31: 15 mg via INTRATHECAL

## 2016-05-31 MED ORDER — ASPIRIN EC 325 MG PO TBEC
325.0000 mg | DELAYED_RELEASE_TABLET | Freq: Every day | ORAL | Status: DC
  Administered 2016-06-01 – 2016-06-02 (×2): 325 mg via ORAL
  Filled 2016-05-31 (×2): qty 1

## 2016-05-31 MED ORDER — DEXAMETHASONE SODIUM PHOSPHATE 10 MG/ML IJ SOLN
INTRAMUSCULAR | Status: DC | PRN
  Administered 2016-05-31: 10 mg via INTRAVENOUS

## 2016-05-31 MED ORDER — ROPIVACAINE HCL 7.5 MG/ML IJ SOLN
INTRAMUSCULAR | Status: DC | PRN
  Administered 2016-05-31: 20 mL via PERINEURAL

## 2016-05-31 MED ORDER — FENTANYL CITRATE (PF) 100 MCG/2ML IJ SOLN
INTRAMUSCULAR | Status: DC | PRN
  Administered 2016-05-31: 50 ug via INTRAVENOUS

## 2016-05-31 MED ORDER — METOPROLOL SUCCINATE ER 25 MG PO TB24
25.0000 mg | ORAL_TABLET | Freq: Every day | ORAL | Status: DC
  Administered 2016-06-01 – 2016-06-02 (×2): 25 mg via ORAL
  Filled 2016-05-31 (×2): qty 1

## 2016-05-31 MED ORDER — MIDAZOLAM HCL 2 MG/2ML IJ SOLN
INTRAMUSCULAR | Status: AC
  Administered 2016-05-31: 2 mg via INTRAVENOUS
  Filled 2016-05-31: qty 2

## 2016-05-31 MED ORDER — CEFAZOLIN SODIUM-DEXTROSE 2-4 GM/100ML-% IV SOLN
2.0000 g | Freq: Four times a day (QID) | INTRAVENOUS | Status: AC
  Administered 2016-05-31 (×2): 2 g via INTRAVENOUS
  Filled 2016-05-31 (×2): qty 100

## 2016-05-31 MED ORDER — MIDAZOLAM HCL 5 MG/5ML IJ SOLN
INTRAMUSCULAR | Status: DC | PRN
  Administered 2016-05-31: 1 mg via INTRAVENOUS

## 2016-05-31 MED ORDER — EPHEDRINE 5 MG/ML INJ
INTRAVENOUS | Status: AC
  Filled 2016-05-31: qty 10

## 2016-05-31 MED ORDER — POVIDONE-IODINE 7.5 % EX SOLN
Freq: Once | CUTANEOUS | Status: DC
Start: 2016-05-31 — End: 2016-05-31

## 2016-05-31 MED ORDER — POTASSIUM CHLORIDE IN NACL 20-0.9 MEQ/L-% IV SOLN
INTRAVENOUS | Status: DC
Start: 2016-05-31 — End: 2016-06-02
  Administered 2016-06-01 (×2): via INTRAVENOUS
  Filled 2016-05-31 (×2): qty 1000

## 2016-05-31 MED ORDER — BUPROPION HCL ER (XL) 150 MG PO TB24
450.0000 mg | ORAL_TABLET | Freq: Every morning | ORAL | Status: DC
  Administered 2016-06-01 – 2016-06-02 (×2): 450 mg via ORAL
  Filled 2016-05-31 (×2): qty 3

## 2016-05-31 MED ORDER — BUPIVACAINE-EPINEPHRINE 0.25% -1:200000 IJ SOLN
INTRAMUSCULAR | Status: DC | PRN
  Administered 2016-05-31: 30 mL

## 2016-05-31 MED ORDER — PHENOL 1.4 % MT LIQD: 1.0000 | OROMUCOSAL | Status: DC | PRN

## 2016-05-31 MED ORDER — MIDAZOLAM HCL 2 MG/2ML IJ SOLN
INTRAMUSCULAR | Status: AC
  Filled 2016-05-31: qty 2

## 2016-05-31 MED ORDER — ALUM & MAG HYDROXIDE-SIMETH 200-200-20 MG/5ML PO SUSP: 30.0000 mL | ORAL | Status: DC | PRN

## 2016-05-31 MED ORDER — FENTANYL CITRATE (PF) 100 MCG/2ML IJ SOLN
INTRAMUSCULAR | Status: AC
  Administered 2016-05-31: 100 ug via INTRAVENOUS
  Filled 2016-05-31: qty 2

## 2016-05-31 MED ORDER — METOCLOPRAMIDE HCL 5 MG PO TABS
5.0000 mg | ORAL_TABLET | Freq: Three times a day (TID) | ORAL | Status: DC | PRN
  Administered 2016-06-01: 5 mg via ORAL
  Filled 2016-05-31: qty 1

## 2016-05-31 MED ORDER — FENTANYL CITRATE (PF) 100 MCG/2ML IJ SOLN
100.0000 ug | Freq: Once | INTRAMUSCULAR | Status: AC
  Administered 2016-05-31: 100 ug via INTRAVENOUS

## 2016-05-31 MED ORDER — PROPOFOL 10 MG/ML IV BOLUS
INTRAVENOUS | Status: DC | PRN
Start: 2016-05-31 — End: 2016-05-31
  Administered 2016-05-31: 20 mg via INTRAVENOUS

## 2016-05-31 MED ORDER — HYDROMORPHONE HCL 2 MG/ML IJ SOLN
1.0000 mg | INTRAMUSCULAR | Status: DC | PRN
  Administered 2016-05-31 – 2016-06-01 (×3): 1 mg via INTRAVENOUS
  Filled 2016-05-31 (×3): qty 1

## 2016-05-31 MED ORDER — FENTANYL CITRATE (PF) 100 MCG/2ML IJ SOLN
INTRAMUSCULAR | Status: AC
  Filled 2016-05-31: qty 2

## 2016-05-31 MED ORDER — ONDANSETRON HCL 4 MG/2ML IJ SOLN
4.0000 mg | Freq: Four times a day (QID) | INTRAMUSCULAR | Status: DC | PRN
  Administered 2016-05-31 – 2016-06-01 (×2): 4 mg via INTRAVENOUS
  Filled 2016-05-31 (×2): qty 2

## 2016-05-31 MED ORDER — POTASSIUM CHLORIDE CRYS ER 20 MEQ PO TBCR
40.0000 meq | EXTENDED_RELEASE_TABLET | Freq: Every day | ORAL | Status: DC
  Administered 2016-05-31 – 2016-06-02 (×3): 40 meq via ORAL
  Filled 2016-05-31 (×3): qty 2

## 2016-05-31 MED ORDER — BUPIVACAINE HCL (PF) 0.25 % IJ SOLN
INTRAMUSCULAR | Status: AC
  Filled 2016-05-31: qty 30

## 2016-05-31 MED ORDER — GLYCOPYRROLATE 0.2 MG/ML IJ SOLN
INTRAMUSCULAR | Status: DC | PRN
  Administered 2016-05-31: 0.2 mg via INTRAVENOUS

## 2016-05-31 MED ORDER — PROPOFOL 500 MG/50ML IV EMUL
INTRAVENOUS | Status: DC | PRN
  Administered 2016-05-31: 100 ug/kg/min via INTRAVENOUS

## 2016-05-31 MED ORDER — CELECOXIB 200 MG PO CAPS
200.0000 mg | ORAL_CAPSULE | Freq: Two times a day (BID) | ORAL | Status: DC
Start: 2016-05-31 — End: 2016-06-02
  Administered 2016-05-31 – 2016-06-02 (×4): 200 mg via ORAL
  Filled 2016-05-31 (×4): qty 1

## 2016-05-31 MED ORDER — ATORVASTATIN CALCIUM 20 MG PO TABS
20.0000 mg | ORAL_TABLET | Freq: Every day | ORAL | Status: DC
  Administered 2016-06-01 – 2016-06-02 (×2): 20 mg via ORAL
  Filled 2016-05-31 (×2): qty 1

## 2016-05-31 MED ORDER — HYDROMORPHONE HCL 1 MG/ML IJ SOLN: 0.2500 mg | INTRAMUSCULAR | Status: DC | PRN

## 2016-05-31 MED ORDER — DOCUSATE SODIUM 100 MG PO CAPS
100.0000 mg | ORAL_CAPSULE | Freq: Two times a day (BID) | ORAL | Status: DC
  Administered 2016-05-31 – 2016-06-02 (×4): 100 mg via ORAL
  Filled 2016-05-31 (×4): qty 1

## 2016-05-31 MED ORDER — DEXAMETHASONE SODIUM PHOSPHATE 10 MG/ML IJ SOLN
10.0000 mg | Freq: Three times a day (TID) | INTRAMUSCULAR | Status: AC
  Administered 2016-05-31 – 2016-06-01 (×4): 10 mg via INTRAVENOUS
  Filled 2016-05-31 (×4): qty 1

## 2016-05-31 MED ORDER — POLYETHYLENE GLYCOL 3350 17 G PO PACK
17.0000 g | PACK | Freq: Two times a day (BID) | ORAL | Status: DC
  Administered 2016-05-31 – 2016-06-02 (×4): 17 g via ORAL
  Filled 2016-05-31 (×4): qty 1

## 2016-05-31 MED ORDER — MENTHOL 3 MG MT LOZG
1.0000 | LOZENGE | OROMUCOSAL | Status: DC | PRN
  Administered 2016-05-31: 3 mg via ORAL
  Filled 2016-05-31: qty 9

## 2016-05-31 MED ORDER — ONDANSETRON HCL 4 MG PO TABS: 4.0000 mg | ORAL_TABLET | Freq: Four times a day (QID) | ORAL | Status: DC | PRN

## 2016-05-31 MED ORDER — MIDAZOLAM HCL 2 MG/2ML IJ SOLN
2.0000 mg | Freq: Once | INTRAMUSCULAR | Status: AC
  Administered 2016-05-31: 2 mg via INTRAVENOUS

## 2016-05-31 SURGICAL SUPPLY — 70 items
APL SKNCLS STERI-STRIP NONHPOA (GAUZE/BANDAGES/DRESSINGS) ×1
BANDAGE ESMARK 6X9 LF (GAUZE/BANDAGES/DRESSINGS) ×1 IMPLANT
BENZOIN TINCTURE PRP APPL 2/3 (GAUZE/BANDAGES/DRESSINGS) ×2 IMPLANT
BLADE SAGITTAL 25.0X1.19X90 (BLADE) ×2 IMPLANT
BLADE SAW SGTL 13X75X1.27 (BLADE) ×2 IMPLANT
BLADE SURG 10 STRL SS (BLADE) ×4 IMPLANT
BNDG CMPR 9X6 STRL LF SNTH (GAUZE/BANDAGES/DRESSINGS) ×1
BNDG CMPR MED 15X6 ELC VLCR LF (GAUZE/BANDAGES/DRESSINGS) ×1
BNDG ELASTIC 6X15 VLCR STRL LF (GAUZE/BANDAGES/DRESSINGS) ×2 IMPLANT
BNDG ESMARK 6X9 LF (GAUZE/BANDAGES/DRESSINGS) ×2
BOWL SMART MIX CTS (DISPOSABLE) ×2 IMPLANT
CAPT KNEE TOTAL 3 ATTUNE ×1 IMPLANT
CEMENT HV SMART SET (Cement) ×4 IMPLANT
COVER SURGICAL LIGHT HANDLE (MISCELLANEOUS) ×2 IMPLANT
CUFF TOURNIQUET SINGLE 34IN LL (TOURNIQUET CUFF) ×2 IMPLANT
CUFF TOURNIQUET SINGLE 44IN (TOURNIQUET CUFF) IMPLANT
DECANTER SPIKE VIAL GLASS SM (MISCELLANEOUS) ×2 IMPLANT
DRAPE EXTREMITY T 121X128X90 (DRAPE) ×2 IMPLANT
DRAPE HALF SHEET 40X57 (DRAPES) ×2 IMPLANT
DRAPE INCISE IOBAN 66X45 STRL (DRAPES) ×1 IMPLANT
DRAPE PROXIMA HALF (DRAPES) ×2 IMPLANT
DRAPE U-SHAPE 47X51 STRL (DRAPES) ×2 IMPLANT
DRSG AQUACEL AG ADV 3.5X14 (GAUZE/BANDAGES/DRESSINGS) ×2 IMPLANT
DURAPREP 26ML APPLICATOR (WOUND CARE) ×4 IMPLANT
ELECT CAUTERY BLADE 6.4 (BLADE) ×2 IMPLANT
ELECT REM PT RETURN 9FT ADLT (ELECTROSURGICAL) ×2
ELECTRODE REM PT RTRN 9FT ADLT (ELECTROSURGICAL) ×1 IMPLANT
FACESHIELD WRAPAROUND (MASK) ×2 IMPLANT
FACESHIELD WRAPAROUND OR TEAM (MASK) ×1 IMPLANT
GLOVE BIO SURGEON STRL SZ7 (GLOVE) ×2 IMPLANT
GLOVE BIOGEL PI IND STRL 7.0 (GLOVE) ×1 IMPLANT
GLOVE BIOGEL PI IND STRL 7.5 (GLOVE) ×1 IMPLANT
GLOVE BIOGEL PI INDICATOR 7.0 (GLOVE) ×1
GLOVE BIOGEL PI INDICATOR 7.5 (GLOVE) ×1
GLOVE SS BIOGEL STRL SZ 7.5 (GLOVE) ×1 IMPLANT
GLOVE SUPERSENSE BIOGEL SZ 7.5 (GLOVE) ×1
GOWN STRL REUS W/ TWL LRG LVL3 (GOWN DISPOSABLE) ×1 IMPLANT
GOWN STRL REUS W/ TWL XL LVL3 (GOWN DISPOSABLE) ×2 IMPLANT
GOWN STRL REUS W/TWL LRG LVL3 (GOWN DISPOSABLE) ×2
GOWN STRL REUS W/TWL XL LVL3 (GOWN DISPOSABLE) ×4
HANDPIECE INTERPULSE COAX TIP (DISPOSABLE) ×2
HOOD PEEL AWAY FACE SHEILD DIS (HOOD) ×4 IMPLANT
IMMOBILIZER KNEE 22 UNIV (SOFTGOODS) ×2 IMPLANT
KIT BASIN OR (CUSTOM PROCEDURE TRAY) ×2 IMPLANT
KIT ROOM TURNOVER OR (KITS) ×2 IMPLANT
MANIFOLD NEPTUNE II (INSTRUMENTS) ×2 IMPLANT
MARKER SKIN DUAL TIP RULER LAB (MISCELLANEOUS) ×2 IMPLANT
NDL 18GX1X1/2 (RX/OR ONLY) (NEEDLE) ×1 IMPLANT
NEEDLE 18GX1X1/2 (RX/OR ONLY) (NEEDLE) ×2 IMPLANT
NS IRRIG 1000ML POUR BTL (IV SOLUTION) ×2 IMPLANT
PACK TOTAL JOINT (CUSTOM PROCEDURE TRAY) ×2 IMPLANT
PAD ARMBOARD 7.5X6 YLW CONV (MISCELLANEOUS) ×4 IMPLANT
SET HNDPC FAN SPRY TIP SCT (DISPOSABLE) ×1 IMPLANT
STRIP CLOSURE SKIN 1/2X4 (GAUZE/BANDAGES/DRESSINGS) ×2 IMPLANT
SUCTION FRAZIER HANDLE 10FR (MISCELLANEOUS) ×1
SUCTION TUBE FRAZIER 10FR DISP (MISCELLANEOUS) ×1 IMPLANT
SUT MNCRL AB 3-0 PS2 18 (SUTURE) ×2 IMPLANT
SUT VIC AB 0 CT1 27 (SUTURE) ×4
SUT VIC AB 0 CT1 27XBRD ANBCTR (SUTURE) ×2 IMPLANT
SUT VIC AB 1 CT1 27 (SUTURE) ×2
SUT VIC AB 1 CT1 27XBRD ANBCTR (SUTURE) ×1 IMPLANT
SUT VIC AB 2-0 CT1 27 (SUTURE) ×4
SUT VIC AB 2-0 CT1 TAPERPNT 27 (SUTURE) ×2 IMPLANT
SYR 30ML LL (SYRINGE) ×2 IMPLANT
TOWEL OR 17X24 6PK STRL BLUE (TOWEL DISPOSABLE) ×1 IMPLANT
TOWEL OR 17X26 10 PK STRL BLUE (TOWEL DISPOSABLE) ×1 IMPLANT
TRAY CATH 16FR W/PLASTIC CATH (SET/KITS/TRAYS/PACK) IMPLANT
TRAY FOLEY CATH 16FR SILVER (SET/KITS/TRAYS/PACK) ×1 IMPLANT
TUBE CONNECTING 12X1/4 (SUCTIONS) ×2 IMPLANT
YANKAUER SUCT BULB TIP NO VENT (SUCTIONS) ×2 IMPLANT

## 2016-05-31 NOTE — Progress Notes (Signed)
Report given to lauren reynolds rn as caregiver 

## 2016-05-31 NOTE — Transfer of Care (Signed)
Immediate Anesthesia Transfer of Care Note  Patient: George [name].  Procedure(s) Performed: Procedure(s): TOTAL KNEE ARTHROPLASTY (Right)  Patient Location: PACU  Anesthesia Type:MAC and Spinal  Level of Consciousness: sedated  Airway & Oxygen Therapy: Patient Spontanous Breathing and Patient connected to nasal cannula oxygen  Post-op Assessment: Report given to RN and Post -op Vital signs reviewed and stable  Post vital signs: Reviewed and stable  Last Vitals:  Vitals:   05/31/16 1023 05/31/16 1330  BP: 121/77   Pulse: 69   Resp: 20   Temp:  (P) 36.2 C    Last Pain:  Vitals:   05/31/16 0905  TempSrc: Oral         Complications: No apparent anesthesia complications

## 2016-05-31 NOTE — Anesthesia Procedure Notes (Signed)
Procedure Name: MAC Date/Time: 05/31/2016 11:10 AM Performed by: Duane Boston Pre-anesthesia Checklist: Patient identified, Emergency Drugs available, Suction available, Patient being monitored and Timeout performed Patient Re-evaluated:Patient Re-evaluated prior to inductionOxygen Delivery Method: Nasal cannula Placement Confirmation: positive ETCO2 Dental Injury: Teeth and Oropharynx as per pre-operative assessment

## 2016-05-31 NOTE — Op Note (Signed)
MRN:     [id number] DOB/AGE:    [birth year] / 73 y.o.       OPERATIVE REPORT    DATE OF PROCEDURE:  05/31/2016       PREOPERATIVE DIAGNOSIS:   Primary localized OA right knee      Estimated body mass index is 27.34 kg/m as calculated from the following:   Height as of 05/20/16: 5\' 11"  (1.803 m).   Weight as of this encounter: 196 lb (88.9 kg).                                                        POSTOPERATIVE DIAGNOSIS:   same                                                                       PROCEDURE:  Procedure(s): TOTAL KNEE ARTHROPLASTY Using Depuy Attune RP implants #7 Femur, #8Tibia, 30mm  RP bearing, 38 Patella     SURGEON: Aryan Sparks A    ASSISTANT:  Kirstin Shepperson PA-C   (Present and scrubbed throughout the case, critical for assistance with exposure, retraction, instrumentation, and closure.)         ANESTHESIA: spinal with Adductor Nerve Block     TOURNIQUET TIME: [id number]   COMPLICATIONS:  None     SPECIMENS: None   INDICATIONS FOR PROCEDURE: The patient has  djd right knee, varus deformities, XR shows bone on bone arthritis. Patient has failed all conservative measures including anti-inflammatory medicines, narcotics, attempts at  exercise and weight loss, cortisone injections and viscosupplementation.  Risks and benefits of surgery have been discussed, questions answered.   DESCRIPTION OF PROCEDURE: [name], received  right femoral nerve block and IV antibiotics, in the holding area at [facility] Patient taken to the operating room, appropriate anesthetic  monitors were attached spinal anesthesia induced with  the patient in supine position, Foley catheter was inserted. Tourniquet  applied high to the operative thigh. Lateral post and foot positioner  applied to the table, the lower extremity was then prepped and draped  in usual sterile fashion from the ankle to the tourniquet. Time-out procedure was performed. The limb  was wrapped with an Esmarch bandage and the tourniquet inflated to 365 mmHg. We began the operation by making the anterior midline incision starting at handbreadth above the patella going over the patella 1 cm medial to and  4 cm distal to the tibial tubercle. Small bleeders in the skin and the  subcutaneous tissue identified and cauterized. Transverse retinaculum was incised and reflected medially and a medial parapatellar arthrotomy was accomplished. the patella was everted and theprepatellar fat pad resected. The superficial medial collateral  ligament was then elevated from anterior to posterior along the proximal  flare of the tibia and anterior half of the menisci resected. The knee was hyperflexed exposing bone on bone arthritis. Peripheral and notch osteophytes as well as the cruciate ligaments were then resected. We continued to  work our way around posteriorly along the proximal tibia, and externally  rotated the tibia subluxing it out  from underneath the femur. A McHale  retractor was placed through the notch and a lateral Hohmann retractor  placed, and we then drilled through the proximal tibia in line with the  axis of the tibia followed by an intramedullary guide rod and 2-degree  posterior slope cutting guide. The tibial cutting guide was pinned into place  allowing resection of 4 mm of bone medially and about 6 mm of bone  laterally because of her varus deformity. Satisfied with the tibial resection, we then  entered the distal femur 2 mm anterior to the PCL origin with the  intramedullary guide rod and applied the distal femoral cutting guide  set at 82mm, with 5 degrees of valgus. This was pinned along the  epicondylar axis. At this point, the distal femoral cut was accomplished without difficulty. We then sized for a #7 femoral component and pinned the guide in 3 degrees of external rotation.The chamfer cutting guide was pinned into place. The anterior, posterior, and chamfer cuts  were accomplished without difficulty followed by  the  RP box cutting guide and the box cut. We also removed posterior osteophytes from the posterior femoral condyles. At this  time, the knee was brought into full extension. We checked our  extension and flexion gaps and found them symmetric at 36mm.  The patella thickness measured at 25 mm. We set the cutting guide at 15 and removed the posterior 9.5-10 mm  of the patella sized for 38 button and drilled the lollipop. The knee  was then once again hyperflexed exposing the proximal tibia. We sized for a #8 tibial base plate, applied the smokestack and the conical reamer followed by the the Delta fin keel punch. We then hammered into place the  RP trial femoral component, inserted a 1 trial bearing, trial patellar button, and took the knee through range of motion from 0-130 degrees. No thumb pressure was required for patellar  tracking. At this point, all trial components were removed, a double batch of DePuy HV cement  was mixed and applied to all bony metallic mating surfaces except for the posterior condyles of the femur itself. In order, we  hammered into place the tibial tray and removed excess cement, the femoral component and removed excess cement, a 5mm  RP bearing  was inserted, and the knee brought to full extension with compression.  The patellar button was clamped into place, and excess cement  removed. While the cement cured the wound was irrigated out with normal saline solution pulse lavage.. Ligament stability and patellar tracking were checked and found to be excellent.. The parapatellar arthrotomy was closed with  #1 Vicryl suture. The subcutaneous tissue with 0 and 2-0 undyed  Vicryl suture, and 4-0 Monocryl.. A dressing of Aquaseal,  4 x 4, dressing sponges, Webril, and Ace wrap applied. Needle and sponge count were correct times 2.The patient awakened, extubated, and taken to recovery room without difficulty. Vascular status was  normal, pulses 2+ and symmetric.   George [name] A 05/31/2016, 12:59 PM

## 2016-05-31 NOTE — Anesthesia Procedure Notes (Signed)
Anesthesia Regional Block: Adductor canal block   Pre-Anesthetic Checklist: ,, timeout performed, Correct Patient, Correct Site, Correct Laterality, Correct Procedure, Correct Position, site marked, Risks and benefits discussed,  Surgical consent,  Pre-op evaluation,  At surgeon's request and post-op pain management  Laterality: Right  Prep: chloraprep       Needles:  Injection technique: Single-shot  Needle Type: Stimulator Needle - 80     Needle Length: 10cm  Needle Gauge: 21     Additional Needles:   Procedures: ultrasound guided,,,,,,,,  Narrative:  Start time: 05/31/2016 10:09 AM End time: 05/31/2016 10:19 AM Injection made incrementally with aspirations every 5 mL.  Performed by: Personally

## 2016-05-31 NOTE — Anesthesia Preprocedure Evaluation (Signed)
Anesthesia Evaluation  Patient identified by MRN, date of birth, ID band Patient awake    Reviewed: Allergy & Precautions, NPO status , Patient's Chart, lab work & pertinent test results  History of Anesthesia Complications Negative for: history of anesthetic complications  Airway Mallampati: I  TM Distance: >3 FB Neck ROM: Full    Dental  (+) Edentulous Upper, Edentulous Lower, Upper Dentures, Lower Dentures   Pulmonary former smoker,    Pulmonary exam normal breath sounds clear to auscultation       Cardiovascular hypertension, Pt. on medications and Pt. on home beta blockers (-) angina(-) CAD, (-) Past MI and (-) CHF Normal cardiovascular exam(-) dysrhythmias  Rhythm:Regular Rate:Normal     Neuro/Psych neg Seizures PSYCHIATRIC DISORDERS Anxiety Depression negative neurological ROS     GI/Hepatic negative GI ROS, Neg liver ROS,   Endo/Other  negative endocrine ROS  Renal/GU negative Renal ROS     Musculoskeletal  (+) Arthritis , Osteoarthritis,    Abdominal   Peds  Hematology negative hematology ROS (+) 12/40,  INR 1.04   Anesthesia Other Findings   Reproductive/Obstetrics                             Anesthesia Physical  Anesthesia Plan  ASA: II  Anesthesia Plan: Spinal   Post-op Pain Management:  Regional for Post-op pain   Induction: Intravenous  Airway Management Planned: Nasal Cannula  Additional Equipment: None  Intra-op Plan:   Post-operative Plan:   Informed Consent: I have reviewed the patients History and Physical, chart, labs and discussed the procedure including the risks, benefits and alternatives for the proposed anesthesia with the patient or authorized representative who has indicated his/her understanding and acceptance.   Dental advisory given  Plan Discussed with: CRNA, Surgeon and Anesthesiologist  Anesthesia Plan Comments: (Spinal plus AC Block)         Anesthesia Quick Evaluation

## 2016-05-31 NOTE — Anesthesia Procedure Notes (Signed)
Spinal  Patient location during procedure: OR Start time: 05/31/2016 11:11 AM End time: 05/31/2016 11:18 AM Staffing Anesthesiologist: Duane Boston Performed: anesthesiologist  Preanesthetic Checklist Completed: patient identified, surgical consent, pre-op evaluation, timeout performed, IV checked, risks and benefits discussed and monitors and equipment checked Spinal Block Patient position: sitting Prep: DuraPrep Patient monitoring: cardiac monitor, continuous pulse ox and blood pressure Approach: midline Location: L2-3 Injection technique: single-shot Needle Needle type: Pencan  Needle gauge: 24 G Needle length: 9 cm Additional Notes Functioning IV was confirmed and monitors were applied. Sterile prep and drape, including hand hygiene and sterile gloves were used. The patient was positioned and the spine was prepped. The skin was anesthetized with lidocaine.  Free flow of clear CSF was obtained prior to injecting local anesthetic into the CSF.  The spinal needle aspirated freely following injection.  The needle was carefully withdrawn.  The patient tolerated the procedure well.

## 2016-05-31 NOTE — Anesthesia Postprocedure Evaluation (Addendum)
Anesthesia Post Note  Patient: George [name].  Procedure(s) Performed: Procedure(s) (LRB): TOTAL KNEE ARTHROPLASTY (Right)  Patient location during evaluation: PACU Anesthesia Type: Spinal Level of consciousness: sedated Pain management: pain level controlled Vital Signs Assessment: post-procedure vital signs reviewed and stable Respiratory status: spontaneous breathing and respiratory function stable Cardiovascular status: stable Anesthetic complications: no       Last Vitals:  Vitals:   05/31/16 1400 05/31/16 1415  BP: (!) 91/56 97/62  Pulse: 69 67  Resp: 14 19  Temp:      Last Pain:  Vitals:   05/31/16 1400  TempSrc:   PainSc: 0-No pain                 Frederich Montilla DANIEL

## 2016-05-31 NOTE — Progress Notes (Signed)
Orthopedic Tech Progress Note Patient Details:  George [name]. Jun 15, 1945 [id number]  CPM Right Knee CPM Right Knee: On Right Knee Flexion (Degrees): 90 Right Knee Extension (Degrees): 0 Additional Comments: Foot roll   George [name] 05/31/2016, 2:05 PM

## 2016-06-01 ENCOUNTER — Encounter (HOSPITAL_COMMUNITY): Payer: Self-pay | Admitting: Orthopedic Surgery

## 2016-06-01 LAB — BASIC METABOLIC PANEL
Anion gap: 9 (ref 5–15)
BUN: 15 mg/dL (ref 6–20)
CALCIUM: 9.1 mg/dL (ref 8.9–10.3)
CO2: 24 mmol/L (ref 22–32)
CREATININE: 1.22 mg/dL (ref 0.61–1.24)
Chloride: 103 mmol/L (ref 101–111)
GFR calc non Af Amer: 57 mL/min — ABNORMAL LOW (ref >60)
Glucose, Bld: 160 mg/dL — ABNORMAL HIGH (ref 65–99)
Potassium: 3.9 mmol/L (ref 3.5–5.1)
SODIUM: 136 mmol/L (ref 135–145)

## 2016-06-01 LAB — CBC
HCT: 34.2 % — ABNORMAL LOW (ref 39.0–52.0)
Hemoglobin: 10.9 g/dL — ABNORMAL LOW (ref 13.0–17.0)
MCH: 27.6 pg (ref 26.0–34.0)
MCHC: 31.9 g/dL (ref 30.0–36.0)
MCV: 86.6 fL (ref 78.0–100.0)
Platelets: 277 10*3/uL (ref 150–400)
RBC: 3.95 MIL/uL — ABNORMAL LOW (ref 4.22–5.81)
RDW: 13.6 % (ref 11.5–15.5)
WBC: 18.3 10*3/uL — ABNORMAL HIGH (ref 4.0–10.5)

## 2016-06-01 MED ORDER — DOCUSATE SODIUM 100 MG PO CAPS: ORAL_CAPSULE | ORAL | 0 refills | Status: DC

## 2016-06-01 MED ORDER — POLYETHYLENE GLYCOL 3350 17 G PO PACK: PACK | ORAL | 0 refills | Status: DC

## 2016-06-01 MED ORDER — ACETAMINOPHEN 325 MG PO TABS: 650.0000 mg | ORAL_TABLET | Freq: Four times a day (QID) | ORAL | Status: DC | PRN

## 2016-06-01 MED ORDER — LOSARTAN POTASSIUM 50 MG PO TABS
100.0000 mg | ORAL_TABLET | Freq: Every day | ORAL | Status: DC
  Administered 2016-06-01 – 2016-06-02 (×2): 100 mg via ORAL
  Filled 2016-06-01 (×2): qty 2

## 2016-06-01 MED ORDER — OXYCODONE HCL 5 MG PO TABS
15.0000 mg | ORAL_TABLET | ORAL | Status: DC | PRN
  Administered 2016-06-01 – 2016-06-02 (×7): 15 mg via ORAL
  Filled 2016-06-01 (×7): qty 3

## 2016-06-01 MED ORDER — FELODIPINE ER 10 MG PO TB24
10.0000 mg | ORAL_TABLET | Freq: Every day | ORAL | Status: DC
  Administered 2016-06-01 – 2016-06-02 (×2): 10 mg via ORAL
  Filled 2016-06-01 (×3): qty 1

## 2016-06-01 MED ORDER — OXYCODONE HCL 15 MG PO TABS
ORAL_TABLET | ORAL | 0 refills | Status: DC
Start: 2016-06-01 — End: 2018-01-31

## 2016-06-01 MED ORDER — ASPIRIN 325 MG PO TBEC: DELAYED_RELEASE_TABLET | ORAL | 0 refills | Status: DC

## 2016-06-01 NOTE — Evaluation (Signed)
Occupational Therapy Evaluation Patient Details Name: George [name]. MRN: [id number] DOB: 06-06-1945 Today's Date: 06/01/2016    History of Present Illness Admitted with RTKA, George [name];  has a past medical history of Anxiety; Arthritis; Asthma; Colon cancer (George [name]); Depression; Hypertension; Idiopathic peripheral neuropathy; I S/P total knee arthroplasty, left (05/19/2016);  has a past surgical history that includes Colon resection ( NOV 2009); Transurethral resection of prostate (N/A, 05/07/2014); Spermatocelectomy (Left, 05/07/2014); Total knee arthroplasty (Left, 04/21/2015)   Clinical Impression   Pt admitted as above whom states that he will have necessary PRN assist for LB ADL's at d/c home. He has walk in shower w/ stool/chair, level entry into house and will have 24/7 assist. Discussed a/e and role of OT, pt currently Min A LB ADL's and min guard-supervision for functional mobility/transfers related to ADL's. Pt denies further acute OT needs at this time, will sign off.    Follow Up Recommendations  No OT follow up;Supervision - Intermittent    Equipment Recommendations  None recommended by OT (Pt has comfort height toilet at home, walk in shower and will have PRN assist for LB ADL's per his report)    Recommendations for Other Services       Precautions / Restrictions Precautions Precautions: Knee Restrictions Weight Bearing Restrictions: Yes RLE Weight Bearing: Weight bearing as tolerated      Mobility Bed Mobility Overal bed mobility: Modified Independent             General bed mobility comments: Increased time, no physical assist  Transfers Overall transfer level: Needs assistance Equipment used: Rolling walker (2 wheeled) Transfers: Sit to/from [employer] Transfers Sit to Stand: Min guard Stand pivot transfers: Supervision            Balance Overall balance assessment: No apparent balance deficits (not formally assessed)                                           ADL Overall ADL's : Needs assistance/impaired Eating/Feeding: Independent;Sitting   Grooming: Wash/dry hands;Sitting;Set up   Upper Body Bathing: Set up;Sitting   Lower Body Bathing: Sit to/from stand;Min guard   Upper Body Dressing : Set up;Sitting   Lower Body Dressing: Min guard;Sit to/from stand   Toilet Transfer: Supervision/safety;Comfort height toilet;Ambulation;RW   Toileting- Clothing Manipulation and Hygiene: Supervision/safety;Sit to/from stand;Sitting/lateral lean   Tub/ Shower Transfer: Walk-in shower;Ambulation;Min guard;Rolling walker Tub/Shower Transfer Details (indicate cue type and reason): Pt state that he has a shower stool at home that he may use. He reportst hat he will have 24/7 PRN assist  Functional mobility during ADLs: Supervision/safety;Rolling walker General ADL Comments: Pt was assessed and educated in Role of OT. He participated in functional mobility and ambulation in room to consist of toilet transfers, simulated walk in shower transfers. Discussed LB bathing and dressing and possible a/e, pt states that he will have PRN assistance for ADL's when d/c home and denies further acute OT needs at this time.      Vision Patient Visual Report: No change from baseline       Perception     Praxis      Pertinent Vitals/Pain Pain Assessment: 0-10 Pain Score: 4  Pain Location: Right Knee Pain Descriptors / Indicators: Sore Pain Intervention(s): Limited activity within patient's tolerance;Monitored during session;Repositioned     Hand Dominance Right   Extremity/Trunk Assessment Upper Extremity Assessment Upper Extremity  Assessment: Overall WFL for tasks assessed   Lower Extremity Assessment Lower Extremity Assessment: Defer to PT evaluation       Communication Communication Communication: No difficulties   Cognition Arousal/Alertness: Awake/alert Behavior During Therapy: WFL for tasks assessed/performed Overall  Cognitive Status: Within Functional Limits for tasks assessed                     General Comments       Exercises       Shoulder Instructions      Home Living Family/patient expects to be discharged to:: Private residence Living Arrangements: Spouse/significant other Available Help at Discharge: Family;Friend(s);Available 24 hours/day Type of Home: House Home Access: Level entry     Home Layout: One level     Bathroom Shower/Tub: Occupational psychologist: Handicapped height Bathroom Accessibility: Yes   Home Equipment: Museum/gallery conservator - 2 wheels;Shower seat          Prior Functioning/Environment Level of Independence: Independent                 OT Problem List:        OT Treatment/Interventions:      OT Goals(Current goals can be found in the care plan section) Acute Rehab OT Goals Patient Stated Goal: Home with family PRN assist; Decreased pain OT Goal Formulation: All assessment and education complete, DC therapy  OT Frequency:     Barriers to D/C:            Co-evaluation              End of Session Equipment Utilized During Treatment: Rolling walker;Right knee immobilizer Nurse Communication: Other (comment);Patient requests pain meds (KI is small, PA states pt can d/c use as he was able to lift RLE for SLR)  Activity Tolerance: Patient tolerated treatment well Patient left: in chair;with call bell/phone within reach;Other (comment) (PA in room)                   ADL either performed or assessed with clinical judgement  Time: [phone] OT Time Calculation (min): 31 min Charges:  OT General Charges $OT Visit: 1 Procedure OT Evaluation $OT Eval Low Complexity: 1 Procedure OT Treatments $Self Care/Home Management : 8-22 mins G-Codes:     George [name], OTR/L 06/01/2016, 9:20 AM

## 2016-06-01 NOTE — Evaluation (Signed)
Physical Therapy Evaluation Patient Details Name: George [name]. MRN: [id number] DOB: Dec 11, 1945 Today's Date: 06/01/2016   History of Present Illness  Admitted with RTKA, George [name];  has a past medical history of Anxiety; Arthritis; Asthma; Colon cancer ([place]); Depression; Hypertension; Idiopathic peripheral neuropathy; I S/P total knee arthroplasty, left (05/19/2016);  has a past surgical history that includes Colon resection ( NOV 2009); Transurethral resection of prostate (N/A, 05/07/2014); Spermatocelectomy (Left, 05/07/2014); Total knee arthroplasty (Left, 04/21/2015)  Clinical Impression   Pt is s/p TKA resulting in the deficits listed below (see PT Problem List). Overall moving well, with nice, stable knee in stance;  Pt will benefit from skilled PT to increase their independence and safety with mobility to allow discharge to the venue listed below.      Follow Up Recommendations Home health PT;Supervision/Assistance - 24 hour    Equipment Recommendations  Rolling walker with 5" wheels;3in1 (PT)    Recommendations for Other Services OT consult     Precautions / Restrictions Precautions Precautions: Knee Precaution Booklet Issued: Yes (comment) Precaution Comments: Pt educated to not allow any pillow or bolster under knee for healing with optimal range of motion.  Restrictions Weight Bearing Restrictions: Yes RLE Weight Bearing: Weight bearing as tolerated      Mobility  Bed Mobility Overal bed mobility: Modified Independent             General bed mobility comments: Increased time, no physical assist  Transfers Overall transfer level: Needs assistance Equipment used: Rolling walker (2 wheeled) Transfers: Sit to/from Stand Sit to Stand: Min guard Stand pivot transfers: Supervision       General transfer comment: Cues for hand placement and safety  Ambulation/Gait Ambulation/Gait assistance: Min guard (without physical contact) Ambulation Distance (Feet): 100  Feet Assistive device: Rolling walker (2 wheeled) Gait Pattern/deviations: Step-through pattern     General Gait Details: Cues for gait sequence and to activate R quad for stance stability; nice stable knee in stance  Stairs            Wheelchair Mobility    Modified Rankin (Stroke Patients Only)       Balance Overall balance assessment: No apparent balance deficits (not formally assessed)                                           Pertinent Vitals/Pain Pain Assessment: 0-10 Pain Score: 9  Pain Location: Right Knee Pain Descriptors / Indicators: Aching;Sore;Grimacing;Guarding Pain Intervention(s): Limited activity within patient's tolerance;Monitored during session;Patient requesting pain meds-RN notified;Ice applied    Home Living Family/patient expects to be discharged to:: Private residence Living Arrangements: Spouse/significant other Available Help at Discharge: Family;Friend(s);Available 24 hours/day Type of Home: House Home Access: Level entry     Home Layout: One level Home Equipment: Hand held shower head;Walker - 2 wheels;Shower seat      Prior Function Level of Independence: Independent               Hand Dominance   Dominant Hand: Right    Extremity/Trunk Assessment   Upper Extremity Assessment Upper Extremity Assessment: Overall WFL for tasks assessed    Lower Extremity Assessment Lower Extremity Assessment: RLE deficits/detail RLE Deficits / Details: Grossly decr AROM and strength postop; good quad contraction with straight leg raise without significant quad lag       Communication   Communication: No difficulties  Cognition Arousal/Alertness: Awake/alert  Behavior During Therapy: WFL for tasks assessed/performed Overall Cognitive Status: Within Functional Limits for tasks assessed                      General Comments      Exercises Total Joint Exercises Quad Sets: AROM;Right;10 reps Straight Leg  Raises: AROM;Right;5 reps   Assessment/Plan    PT Assessment Patient needs continued PT services  PT Problem List Decreased strength;Decreased range of motion;Decreased activity tolerance;Decreased knowledge of use of DME;Decreased knowledge of precautions;Pain       PT Treatment Interventions DME instruction;Gait training;Functional mobility training;Therapeutic activities;Therapeutic exercise;Patient/family education    PT Goals (Current goals can be found in the Care Plan section)  Acute Rehab PT Goals Patient Stated Goal: Walk without pain PT Goal Formulation: With patient Time For Goal Achievement: 06/08/16 Potential to Achieve Goals: Good    Frequency 7X/week   Barriers to discharge        Co-evaluation               End of Session Equipment Utilized During Treatment: Gait belt Activity Tolerance: Patient tolerated treatment well Patient left: in chair;with call bell/phone within reach Nurse Communication: Mobility status;Patient requests pain meds PT Visit Diagnosis: Pain Pain - Right/Left: Right Pain - part of body: Knee         Time: [id number] PT Time Calculation (min) (ACUTE ONLY): 19 min   Charges:   PT Evaluation $PT Eval Moderate Complexity: 1 Procedure     PT G CodesColletta Maryland 06/01/2016, 10:49 AM  Roney Marion, PT  Acute Rehabilitation Services Pager [phone] Office [phone]

## 2016-06-01 NOTE — Care Management Note (Signed)
Case Management Note  Patient Details  Name: Callis Adamic. MRN: [id number] Date of Birth: 1945-06-11  Subjective/Objective:   70 yr old male s/p right total knee arthroplasty.                 Action/Plan: Patient was preoperatively setup with [facility], no changes. CPM has been delivered to patient's home, He has Rolling walker and 3in1. Will have family support at discharge.    Expected Discharge Date:  06/02/16               Expected Discharge Plan:  [place]  In-House Referral:  NA  Discharge planning Services  CM Consult  Post Acute Care Choice:  Home Health Choice offered to:  Patient  DME Arranged:  CPM DME Agency:  [employer]  HH Arranged:  PT HH Agency:  [facility] (formerly [employer])  Status of Service:  Completed, signed off  If discussed at [employer], dates discussed:    Additional Comments:  Ninfa Meeker, RN 06/01/2016, 2:44 PM

## 2016-06-01 NOTE — Progress Notes (Signed)
Pt. Placed in CPM at 1520. Tolerating well. Will continue to monitor.

## 2016-06-01 NOTE — Progress Notes (Signed)
Subjective: 1 Day Post-Op Procedure(s) (LRB): TOTAL KNEE ARTHROPLASTY (Right) Patient reports pain as 7 on 0-10 scale.    Objective: Vital signs in last 24 hours: Temp:  [97.2 F (36.2 C)-98.1 F (36.7 C)] 98.1 F (36.7 C) (02/20 0600) Pulse Rate:  [66-92] 87 (02/20 0600) Resp:  [11-24] 16 (02/20 0600) BP: (83-182)/(51-98) 182/96 (02/20 0600) SpO2:  [90 %-100 %] 97 % (02/20 0600) Weight:  [88.9 kg (196 lb)] 88.9 kg (196 lb) (02/19 0905)  Intake/Output from previous day: 02/19 0701 - 02/20 0700 In: 1540 [P.O.:240; I.V.:1200; IV Piggyback:100] Out: 2300 [Urine:2300] Intake/Output this shift: No intake/output data recorded.   Recent Labs  06/01/16 0537  HGB 10.9*    Recent Labs  06/01/16 0537  WBC 18.3*  RBC 3.95*  HCT 34.2*  PLT 277    Recent Labs  06/01/16 0537  NA 136  K 3.9  CL 103  CO2 24  BUN 15  CREATININE 1.22  GLUCOSE 160*  CALCIUM 9.1   No results for input(s): LABPT, INR in the last 72 hours.  Neurologically intact ABD soft Neurovascular intact Sensation intact distally Intact pulses distally Incision: dressing C/D/I  Assessment/Plan: 1 Day Post-Op Procedure(s) (LRB): TOTAL KNEE ARTHROPLASTY (Right)  Principal Problem:   Primary localized osteoarthrosis of the knee, right Active Problems:   Malignant neoplasm of prostate (HCC)   Spermatocele   Generalized anxiety disorder   Panic attacks   S/P total knee arthroplasty, left   Primary localized osteoarthritis of right knee  Advance diet Up with therapy Plan for discharge tomorrow Discharge home with home health  Patient's pain is not controlled with PO pain medication will add 5 more mg Oxy to do Oxy 15mg  q 3 hrs with Dilaudid IV for breakthrough   George [name] J 06/01/2016, 8:39 AM

## 2016-06-01 NOTE — Progress Notes (Signed)
Physical Therapy Treatment Patient Details Name: George [name]. MRN: [id number] DOB: 08-13-1945 Today's Date: 06/01/2016    History of Present Illness Admitted with RTKA, [name];  has a past medical history of Anxiety; Arthritis; Asthma; Colon cancer ([place]); Depression; Hypertension; Idiopathic peripheral neuropathy; I S/P total knee arthroplasty, left (05/19/2016);  has a past surgical history that includes Colon resection ( NOV 2009); Transurethral resection of prostate (N/A, 05/07/2014); Spermatocelectomy (Left, 05/07/2014); Total knee arthroplasty (Left, 04/21/2015)    PT Comments    Excellent progress with functional mobility and progressive ambulation; on track for dc home tomorrow   Follow Up Recommendations  Home health PT;Supervision/Assistance - 24 hour     Equipment Recommendations  Rolling walker with 5" wheels;3in1 (PT)    Recommendations for Other Services       Precautions / Restrictions Precautions Precautions: Knee Precaution Booklet Issued: Yes (comment) Precaution Comments: Pt educated to not allow any pillow or bolster under knee for healing with optimal range of motion.  Restrictions RLE Weight Bearing: Weight bearing as tolerated    Mobility  Bed Mobility Overal bed mobility: Modified Independent             General bed mobility comments: Increased time, no physical assist  Transfers Overall transfer level: Needs assistance Equipment used: Rolling walker (2 wheeled) Transfers: Sit to/from Stand Sit to Stand: Min guard (without physical contact)         General transfer comment: Cues for hand placement and safety; good descent to and rise from low bench  Ambulation/Gait Ambulation/Gait assistance: Supervision Ambulation Distance (Feet): 550 Feet Assistive device: Rolling walker (2 wheeled) Gait Pattern/deviations: Step-through pattern     General Gait Details: Cues for gait sequence and to activate R quad for stance stability; nice stable  knee in stance   Stairs            Wheelchair Mobility    Modified Rankin (Stroke Patients Only)       Balance                                    Cognition Arousal/Alertness: Awake/alert Behavior During Therapy: WFL for tasks assessed/performed Overall Cognitive Status: Within Functional Limits for tasks assessed                      Exercises Total Joint Exercises Long Arc Quad: AAROM;Right;5 reps Knee Flexion: AROM;AAROM;Seated;5 reps;Right;Other (comment) (taught self-AAROM in sitting) Goniometric ROM: approx 0-80deg    General Comments        Pertinent Vitals/Pain Pain Assessment: 0-10 Pain Score: 5  Pain Location: Right Knee Pain Descriptors / Indicators: Aching;Sore;Grimacing;Guarding Pain Intervention(s): Monitored during session;Premedicated before session    Home Living                      Prior Function            PT Goals (current goals can now be found in the care plan section) Acute Rehab PT Goals Patient Stated Goal: Walk without pain PT Goal Formulation: With patient Time For Goal Achievement: 06/08/16 Potential to Achieve Goals: Good Progress towards PT goals: Progressing toward goals    Frequency    7X/week      PT Plan Current plan remains appropriate    Co-evaluation             End of Session Equipment Utilized During Treatment: Gait belt  Activity Tolerance: Patient tolerated treatment well Patient left: in bed;with call bell/phone within reach;in CPM Nurse Communication: Mobility status PT Visit Diagnosis: Pain Pain - Right/Left: Right Pain - part of body: Knee     Time: [id number] PT Time Calculation (min) (ACUTE ONLY): 26 min  Charges:  $Gait Training: 23-37 mins                    G Codes:       Colletta Maryland 06-21-2016, 4:12 PM  Roney Marion, [place] Pager [phone] Office [phone]

## 2016-06-02 LAB — BASIC METABOLIC PANEL
ANION GAP: 9 (ref 5–15)
BUN: 14 mg/dL (ref 6–20)
CALCIUM: 8.8 mg/dL — AB (ref 8.9–10.3)
CO2: 23 mmol/L (ref 22–32)
Chloride: 102 mmol/L (ref 101–111)
Creatinine, Ser: 1.1 mg/dL (ref 0.61–1.24)
GFR calc Af Amer: >60 mL/min (ref >60)
GFR calc non Af Amer: >60 mL/min (ref >60)
GLUCOSE: 122 mg/dL — AB (ref 65–99)
Potassium: 4 mmol/L (ref 3.5–5.1)
Sodium: 134 mmol/L — ABNORMAL LOW (ref 135–145)

## 2016-06-02 LAB — CBC
HEMATOCRIT: 30.8 % — AB (ref 39.0–52.0)
HEMOGLOBIN: 10.1 g/dL — AB (ref 13.0–17.0)
MCH: 28.5 pg (ref 26.0–34.0)
MCHC: 32.8 g/dL (ref 30.0–36.0)
MCV: 87 fL (ref 78.0–100.0)
Platelets: 242 10*3/uL (ref 150–400)
RBC: 3.54 MIL/uL — ABNORMAL LOW (ref 4.22–5.81)
RDW: 14.2 % (ref 11.5–15.5)
WBC: 21.3 10*3/uL — ABNORMAL HIGH (ref 4.0–10.5)

## 2016-06-02 MED ORDER — CEPHALEXIN 500 MG PO CAPS
500.0000 mg | ORAL_CAPSULE | Freq: Two times a day (BID) | ORAL | Status: DC
  Administered 2016-06-02: 500 mg via ORAL
  Filled 2016-06-02: qty 1

## 2016-06-02 MED ORDER — ONDANSETRON HCL 4 MG PO TABS: 4.0000 mg | ORAL_TABLET | Freq: Four times a day (QID) | ORAL | 0 refills | Status: DC | PRN

## 2016-06-02 MED ORDER — CEPHALEXIN 500 MG PO CAPS: 500.0000 mg | ORAL_CAPSULE | Freq: Four times a day (QID) | ORAL | 0 refills | Status: DC

## 2016-06-02 NOTE — Discharge Summary (Signed)
Patient ID: George [name]. MRN: [id number] DOB/AGE: 1946/03/10 71 y.o.  Admit date: 05/31/2016 Discharge date: 06/02/2016  Admission Diagnoses:  Principal Problem:   Primary localized osteoarthrosis of the knee, right Active Problems:   Malignant neoplasm of prostate (HCC)   Spermatocele   Generalized anxiety disorder   Panic attacks   S/P total knee arthroplasty, left   Primary localized osteoarthritis of right knee   Discharge Diagnoses:  Same  Past Medical History:  Diagnosis Date  . Anxiety   . Arthritis    OSTEO IN KNEE  . Asthma    as child  . Colon cancer ([place])   . Depression   . Elevated prostate specific antigen (PSA)   . Generalized anxiety disorder 04/09/2015  . Hematospermia   . Hx antineoplastic chemotherapy 2010  . Hypertension   . 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 Incomplete bladder emptying   . Malignant neoplasm of prostate ([place])   . Nodular prostate with lower urinary tract symptoms   . Panic attacks 04/09/2015  . Primary localized osteoarthrosis of the knee, right 05/19/2016  . Prostatitis   . S/P total knee arthroplasty, left 05/19/2016  . Spermatocele   . Weak urinary stream     Surgeries: Procedure(s): TOTAL KNEE ARTHROPLASTY on 05/31/2016   Consultants:   Discharged Condition: Improved  Hospital Course: George [name]. is an 71 y.o. male who was admitted 05/31/2016 for operative treatment ofPrimary localized osteoarthrosis of the knee, right. Patient has severe unremitting pain that affects sleep, daily activities, and work/hobbies. After pre-op clearance the patient was taken to the operating room on 05/31/2016 and underwent  Procedure(s): TOTAL KNEE ARTHROPLASTY.    Patient was given perioperative antibiotics: Anti-infectives    Start     Dose/Rate Route Frequency Ordered Stop   06/02/16 0800  cephALEXin (KEFLEX) capsule 500 mg     500 mg Oral Every 12 hours 06/02/16 0658     06/02/16 0000   cephALEXin (KEFLEX) 500 MG capsule     500 mg Oral Every 6 hours 06/02/16 0659     05/31/16 1630  ceFAZolin (ANCEF) IVPB 2g/100 mL premix     2 g 200 mL/hr over 30 Minutes Intravenous Every 6 hours 05/31/16 1615 06/01/16 0009   05/31/16 1000  ceFAZolin (ANCEF) IVPB 2g/100 mL premix     2 g 200 mL/hr over 30 Minutes Intravenous To [facility] 05/31/16 0905 05/31/16 1115       Patient was given sequential compression devices, early ambulation, and chemoprophylaxis to prevent DVT.  Patient benefited maximally from hospital stay and there were no complications.    Recent vital signs: Patient Vitals for the past 24 hrs:  BP Temp Temp src Pulse Resp SpO2  06/02/16 0442 (!) 147/71 98.2 F (36.8 C) Oral 72 18 97 %  06/01/16 2147 136/78 98.1 F (36.7 C) Oral 77 18 97 %  06/01/16 1732 (!) 168/88 - - - - -  06/01/16 1354 (!) 183/85 98 F (36.7 C) Oral 85 17 99 %  06/01/16 1133 (!) 168/81 - - - - -  06/01/16 1044 (!) 181/84 - - 80 - -     Recent laboratory studies:  Recent Labs  06/01/16 0537 06/02/16 0428  WBC 18.3* 21.3*  HGB 10.9* 10.1*  HCT 34.2* 30.8*  PLT 277 242  NA 136 134*  K 3.9 4.0  CL 103 102  CO2 24 23  BUN 15 14  CREATININE 1.22  1.10  GLUCOSE 160* 122*  CALCIUM 9.1 8.8*     Discharge Medications:   Allergies as of 06/02/2016      Reactions   Amlodipine    Dizziness       Medication List    TAKE these medications   acetaminophen 325 MG tablet Commonly known as:  TYLENOL Take 2 tablets (650 mg total) by mouth every 6 (six) hours as needed for mild pain (or Fever >/= 101).   aspirin 325 MG EC tablet 1 tab a day for the next 30 days to prevent blood clots   atorvastatin 20 MG tablet Commonly known as:  LIPITOR Take 20 mg by mouth daily.   buPROPion 150 MG 24 hr tablet Commonly known as:  WELLBUTRIN XL Take 450 mg by mouth every morning.   cephALEXin 500 MG capsule Commonly known as:  KEFLEX Take 1 capsule (500 mg total) by mouth every 6  (six) hours.   docusate sodium 100 MG capsule Commonly known as:  COLACE 1 tab 2 times a day while on narcotics.  STOOL SOFTENER   felodipine 10 MG 24 hr tablet Commonly known as:  PLENDIL Take 1 tablet by mouth daily.   losartan 100 MG tablet Commonly known as:  COZAAR Take 100 mg by mouth daily.   metoprolol succinate 25 MG 24 hr tablet Commonly known as:  TOPROL-XL Take 25 mg by mouth daily.   ondansetron 4 MG tablet Commonly known as:  ZOFRAN Take 1 tablet (4 mg total) by mouth every 6 (six) hours as needed for nausea.   oxyCODONE 15 MG immediate release tablet Commonly known as:  ROXICODONE 1 tablet every 3-4 hrs as needed for pain   polyethylene glycol packet Commonly known as:  MIRALAX / GLYCOLAX 17grams in 6 oz of water twice a day until bowel movement.  LAXITIVE.  Restart if two days since last bowel movement   potassium chloride SA 20 MEQ tablet Commonly known as:  K-DUR,KLOR-CON Take 40 mEq by mouth daily.       Diagnostic Studies: No results found.  Disposition: 06-Home-Health Care Svc  Discharge Instructions    CPM    Complete by:  As directed    Continuous passive motion machine (CPM):      Use the CPM from 0 to 90 for 6 hours per day.       You may break it up into 2 or 3 sessions per day.      Use CPM for 2 weeks or until you are told to stop.   Call MD / Call 911    Complete by:  As directed    If you experience chest pain or shortness of breath, CALL 911 and be transported to the hospital emergency room.  If you develope a fever above 101 F, pus (white drainage) or increased drainage or redness at the wound, or calf pain, call your surgeon's office.   Change dressing    Complete by:  As directed    DO NOT REMOVE BANDAGE OVER SURGICAL INCISION.  [place] WHOLE LEG INCLUDING OVER THE WATERPROOF BANDAGE WITH SOAP AND WATER EVERY DAY.   Constipation Prevention    Complete by:  As directed    Drink plenty of fluids.  Prune juice may be helpful.  You  may use a stool softener, such as Colace (over the counter) 100 mg twice a day.  Use MiraLax (over the counter) for constipation as needed.   Diet - low sodium heart healthy  Complete by:  As directed    Discharge instructions    Complete by:  As directed    INSTRUCTIONS AFTER JOINT REPLACEMENT   Remove items at home which could result in a fall. This includes throw rugs or furniture in walking pathways ICE to the affected joint every three hours while awake for 30 minutes at a time, for at least the first 3-5 days, and then as needed for pain and swelling.  Continue to use ice for pain and swelling. You may notice swelling that will progress down to the foot and ankle.  This is normal after surgery.  Elevate your leg when you are not up walking on it.   Continue to use the breathing machine you got in the hospital (incentive spirometer) which will help keep your temperature down.  It is common for your temperature to cycle up and down following surgery, especially at night when you are not up moving around and exerting yourself.  The breathing machine keeps your lungs expanded and your temperature down.   DIET:  As you were doing prior to hospitalization, we recommend a well-balanced diet.  DRESSING / WOUND CARE / SHOWERING  Keep the surgical dressing until follow up.  The dressing is water proof, so you can shower without any extra covering.  IF THE DRESSING FALLS OFF or the wound gets wet inside, change the dressing with sterile gauze.  Please use good hand washing techniques before changing the dressing.  Do not use any lotions or creams on the incision until instructed by your surgeon.    ACTIVITY  Increase activity slowly as tolerated, but follow the weight bearing instructions below.   No driving for 6 weeks or until further direction given by your physician.  You cannot drive while taking narcotics.  No lifting or carrying greater than 10 lbs. until further directed by your  surgeon. Avoid periods of inactivity such as sitting longer than an hour when not asleep. This helps prevent blood clots.  You may return to work once you are authorized by your doctor.     WEIGHT BEARING   Weight bearing as tolerated with assist device (walker, cane, etc) as directed, use it as long as suggested by your surgeon or therapist, typically at least 2-3 weeks.   EXERCISES  Results after joint replacement surgery are often greatly improved when you follow the exercise, range of motion and muscle strengthening exercises prescribed by your doctor. Safety measures are also important to protect the joint from further injury. Any time any of these exercises cause you to have increased pain or swelling, decrease what you are doing until you are comfortable again and then slowly increase them. If you have problems or questions, call your caregiver or physical therapist for advice.   Rehabilitation is important following a joint replacement. After just a few days of immobilization, the muscles of the leg can become weakened and shrink (atrophy).  These exercises are designed to build up the tone and strength of the thigh and leg muscles and to improve motion. Often times heat used for twenty to thirty minutes before working out will loosen up your tissues and help with improving the range of motion but do not use heat for the first two weeks following surgery (sometimes heat can increase post-operative swelling).   These exercises can be done on a training (exercise) mat, on the floor, on a table or on a bed. Use whatever works the best and is most comfortable for you.  Use music or television while you are exercising so that the exercises are a pleasant break in your day. This will make your life better with the exercises acting as a break in your routine that you can look forward to.   Perform all exercises about fifteen times, three times per day or as directed.  You should exercise both the  operative leg and the other leg as well.   Exercises include:  Quad Sets - Tighten up the muscle on the front of the thigh (Quad) and hold for 5-10 seconds.   Straight Leg Raises - With your knee straight (if you were given a brace, keep it on), lift the leg to 60 degrees, hold for 3 seconds, and slowly lower the leg.  Perform this exercise against resistance later as your leg gets stronger.  Leg Slides: Lying on your back, slowly slide your foot toward your buttocks, bending your knee up off the floor (only go as far as is comfortable). Then slowly slide your foot back down until your leg is flat on the floor again.  Angel Wings: Lying on your back spread your legs to the side as far apart as you can without causing discomfort.  Hamstring Strength:  Lying on your back, push your heel against the floor with your leg straight by tightening up the muscles of your buttocks.  Repeat, but this time bend your knee to a comfortable angle, and push your heel against the floor.  You may put a pillow under the heel to make it more comfortable if necessary.   A rehabilitation program following joint replacement surgery can speed recovery and prevent re-injury in the future due to weakened muscles. Contact your doctor or a physical therapist for more information on knee rehabilitation.    CONSTIPATION  Constipation is defined medically as fewer than three stools per week and severe constipation as less than one stool per week.  Even if you have a regular bowel pattern at home, your normal regimen is likely to be disrupted due to multiple reasons following surgery.  Combination of anesthesia, postoperative narcotics, change in appetite and fluid intake all can affect your bowels.   YOU MUST use at least one of the following options; they are listed in order of increasing strength to get the job done.  They are all available over the counter, and you may need to use some, POSSIBLY even all of these options:     Drink plenty of fluids (prune juice may be helpful) and high fiber foods Colace 100 mg by mouth twice a day  Senokot for constipation as directed and as needed Dulcolax (bisacodyl), take with full glass of water  Miralax (polyethylene glycol) once or twice a day as needed.  If you have tried all these things and are unable to have a bowel movement in the first 3-4 days after surgery call either your surgeon or your primary doctor.    If you experience loose stools or diarrhea, hold the medications until you stool forms back up.  If your symptoms do not get better within 1 week or if they get worse, check with your doctor.  If you experience "the worst abdominal pain ever" or develop nausea or vomiting, please contact the office immediately for further recommendations for treatment.   ITCHING:  If you experience itching with your medications, try taking only a single pain pill, or even half a pain pill at a time.  You can also use Benadryl over the counter  for itching or also to help with sleep.   TED HOSE STOCKINGS:  Use stockings on both legs until for at least 2 weeks or as directed by physician office. They may be removed at night for sleeping.  MEDICATIONS:  See your medication summary on the "After Visit Summary" that nursing will review with you.  You may have some home medications which will be placed on hold until you complete the course of blood thinner medication.  It is important for you to complete the blood thinner medication as prescribed.  PRECAUTIONS:  If you experience chest pain or shortness of breath - call 911 immediately for transfer to the hospital emergency department.   If you develop a fever greater that 101 F, purulent drainage from wound, increased redness or drainage from wound, foul odor from the wound/dressing, or calf pain - CONTACT YOUR SURGEON.                                                   FOLLOW-UP APPOINTMENTS:  If you do not already have a post-op  appointment, please call the office for an appointment to be seen by your surgeon.  Guidelines for how soon to be seen are listed in your "After Visit Summary", but are typically between 1-4 weeks after surgery.  OTHER INSTRUCTIONS:   Knee Replacement:  Do not place pillow under knee, focus on keeping the knee straight while resting. CPM instructions: 0-90 degrees, 2 hours in the morning, 2 hours in the afternoon, and 2 hours in the evening. Place foam block, curve side up under heel at all times except when in CPM or when walking.  DO NOT modify, tear, cut, or change the foam block in any way.  MAKE SURE YOU:  Understand these instructions.  Get help right away if you are not doing well or get worse.    Thank you for letting us be a part of your medical care team.  It is a privilege we respect greatly.  We hope these instructions will help you stay on track for a fast and full recovery!   Do not put a pillow under the knee. Place it under the heel.    Complete by:  As directed    Place gray foam block, curve side up under heel at all times except when in CPM or when walking.  DO NOT modify, tear, cut, or change in any way the gray foam block.   Increase activity slowly as tolerated    Complete by:  As directed    Patient may shower    Complete by:  As directed    Aquacel dressing is water proof    Wash over it and the whole leg with soap and water at the end of your shower   TED hose    Complete by:  As directed    Use stockings (TED hose) for 2 weeks on both leg(s).  You may remove them at night for sleeping.      Follow-up Information    Lorn Junes, MD Follow up on 06/14/2016.   Specialty:  Orthopedic Surgery Why:  APPT TIME 4 PM Contact information: [address] [place] [address] [place] Follow up.   Specialty:  Home Health Services Why:  Someone from  [facility] will contact you to arrange start date and time for  therapy. Contact information: [address] [place] [address] [phone]            Signed: Linda Hedges 06/02/2016, 7:42 AM

## 2016-06-02 NOTE — Progress Notes (Signed)
Physical Therapy Treatment Patient Details Name: George [name]. MRN: [id number] DOB: [birth year] Today's Date: 06/02/2016    History of Present Illness Admitted with RTKA, [name];  has a past medical history of Anxiety; Arthritis; Asthma; Colon cancer ([place]); Depression; Hypertension; Idiopathic peripheral neuropathy; I S/P total knee arthroplasty, left (05/19/2016);  has a past surgical history that includes Colon resection ( NOV 2009); Transurethral resection of prostate (N/A, 05/07/2014); Spermatocelectomy (Left, 05/07/2014); Total knee arthroplasty (Left, 04/21/2015)    PT Comments    Excellent progress with progressive amb and functional mobility; Nice, stable knee in stance; OK for dc home from PT standpoint    Follow Up Recommendations  Home health PT;Supervision - Intermittent     Equipment Recommendations  Rolling walker with 5" wheels;3in1 (PT)    Recommendations for Other Services       Precautions / Restrictions Precautions Precautions: Knee Precaution Booklet Issued: Yes (comment) Precaution Comments: Pt educated to not allow any pillow or bolster under knee for healing with optimal range of motion.  Restrictions RLE Weight Bearing: Weight bearing as tolerated    Mobility  Bed Mobility                  Transfers Overall transfer level: Needs assistance Equipment used: None;Rolling walker (2 wheeled) Transfers: Sit to/from Stand Sit to Stand: Supervision         General transfer comment: Good descent to and rise from low bench  Ambulation/Gait Ambulation/Gait assistance: Supervision;Min guard Ambulation Distance (Feet): 500 Feet (greater than) Assistive device: Rolling walker (2 wheeled);None Gait Pattern/deviations: Step-through pattern;Decreased stance time - right     General Gait Details: Cues for gait sequence and to activate R quad for stance stability; nice stable knee in stance; Walked about 50 feet without RW with noted increased gait  deviations, but no gross unsteadiness -- more pain R knee without UE support from [employer] Mobility    Modified Rankin (Stroke Patients Only)       Balance                                    Cognition Arousal/Alertness: Awake/alert Behavior During Therapy: WFL for tasks assessed/performed Overall Cognitive Status: Within Functional Limits for tasks assessed                      Exercises      General Comments        Pertinent Vitals/Pain Pain Assessment: 0-10 Pain Score: 4  Pain Location: Right Knee Pain Descriptors / Indicators: Aching Pain Intervention(s): Monitored during session    Home Living                      Prior Function            PT Goals (current goals can now be found in the care plan section) Acute Rehab PT Goals Patient Stated Goal: Walk without pain PT Goal Formulation: With patient Time For Goal Achievement: 06/08/16 Potential to Achieve Goals: Good Progress towards PT goals: Progressing toward goals    Frequency    7X/week      PT Plan Current plan remains appropriate    Co-evaluation             End of Session   Activity Tolerance: Patient tolerated treatment well Patient  left: Other (comment) (sitting on couch; cell phone with him) Nurse Communication: Mobility status PT Visit Diagnosis: Pain Pain - Right/Left: Right Pain - part of body: Knee     Time: [id number] PT Time Calculation (min) (ACUTE ONLY): 14 min  Charges:  $Gait Training: 8-22 mins                    G Codes:       Colletta Maryland 06-20-16, 9:40 AM  Roney Marion, [place] Pager [phone] Office [phone]

## 2016-06-02 NOTE — Progress Notes (Signed)
Pt discharged with wife home taking all personal belongings. IV discontinued, dry dressing applied. Discharge instructions with prescriptions provided with verbal understanding. Wife at bedside. Pt will follow up with Md per discharge instructions. No noted distress.

## 2016-06-03 DIAGNOSIS — I10 Essential (primary) hypertension: Secondary | ICD-10-CM | POA: Diagnosis not present

## 2016-06-03 DIAGNOSIS — G629 Polyneuropathy, unspecified: Secondary | ICD-10-CM | POA: Diagnosis not present

## 2016-06-03 DIAGNOSIS — Z471 Aftercare following joint replacement surgery: Secondary | ICD-10-CM | POA: Diagnosis not present

## 2016-06-03 DIAGNOSIS — M1990 Unspecified osteoarthritis, unspecified site: Secondary | ICD-10-CM | POA: Diagnosis not present

## 2016-06-03 DIAGNOSIS — F410 Panic disorder [episodic paroxysmal anxiety] without agoraphobia: Secondary | ICD-10-CM | POA: Diagnosis not present

## 2016-06-03 DIAGNOSIS — F329 Major depressive disorder, single episode, unspecified: Secondary | ICD-10-CM | POA: Diagnosis not present

## 2016-06-04 DIAGNOSIS — F329 Major depressive disorder, single episode, unspecified: Secondary | ICD-10-CM | POA: Diagnosis not present

## 2016-06-04 DIAGNOSIS — M1990 Unspecified osteoarthritis, unspecified site: Secondary | ICD-10-CM | POA: Diagnosis not present

## 2016-06-04 DIAGNOSIS — G629 Polyneuropathy, unspecified: Secondary | ICD-10-CM | POA: Diagnosis not present

## 2016-06-04 DIAGNOSIS — Z471 Aftercare following joint replacement surgery: Secondary | ICD-10-CM | POA: Diagnosis not present

## 2016-06-04 DIAGNOSIS — F410 Panic disorder [episodic paroxysmal anxiety] without agoraphobia: Secondary | ICD-10-CM | POA: Diagnosis not present

## 2016-06-04 DIAGNOSIS — I10 Essential (primary) hypertension: Secondary | ICD-10-CM | POA: Diagnosis not present

## 2016-06-07 DIAGNOSIS — Z471 Aftercare following joint replacement surgery: Secondary | ICD-10-CM | POA: Diagnosis not present

## 2016-06-07 DIAGNOSIS — F410 Panic disorder [episodic paroxysmal anxiety] without agoraphobia: Secondary | ICD-10-CM | POA: Diagnosis not present

## 2016-06-07 DIAGNOSIS — G629 Polyneuropathy, unspecified: Secondary | ICD-10-CM | POA: Diagnosis not present

## 2016-06-07 DIAGNOSIS — M1990 Unspecified osteoarthritis, unspecified site: Secondary | ICD-10-CM | POA: Diagnosis not present

## 2016-06-07 DIAGNOSIS — F329 Major depressive disorder, single episode, unspecified: Secondary | ICD-10-CM | POA: Diagnosis not present

## 2016-06-07 DIAGNOSIS — I10 Essential (primary) hypertension: Secondary | ICD-10-CM | POA: Diagnosis not present

## 2016-06-08 DIAGNOSIS — Z471 Aftercare following joint replacement surgery: Secondary | ICD-10-CM | POA: Diagnosis not present

## 2016-06-08 DIAGNOSIS — M1990 Unspecified osteoarthritis, unspecified site: Secondary | ICD-10-CM | POA: Diagnosis not present

## 2016-06-08 DIAGNOSIS — F329 Major depressive disorder, single episode, unspecified: Secondary | ICD-10-CM | POA: Diagnosis not present

## 2016-06-08 DIAGNOSIS — F410 Panic disorder [episodic paroxysmal anxiety] without agoraphobia: Secondary | ICD-10-CM | POA: Diagnosis not present

## 2016-06-08 DIAGNOSIS — I10 Essential (primary) hypertension: Secondary | ICD-10-CM | POA: Diagnosis not present

## 2016-06-08 DIAGNOSIS — G629 Polyneuropathy, unspecified: Secondary | ICD-10-CM | POA: Diagnosis not present

## 2016-06-09 DIAGNOSIS — M1990 Unspecified osteoarthritis, unspecified site: Secondary | ICD-10-CM | POA: Diagnosis not present

## 2016-06-09 DIAGNOSIS — F410 Panic disorder [episodic paroxysmal anxiety] without agoraphobia: Secondary | ICD-10-CM | POA: Diagnosis not present

## 2016-06-09 DIAGNOSIS — Z471 Aftercare following joint replacement surgery: Secondary | ICD-10-CM | POA: Diagnosis not present

## 2016-06-09 DIAGNOSIS — F329 Major depressive disorder, single episode, unspecified: Secondary | ICD-10-CM | POA: Diagnosis not present

## 2016-06-09 DIAGNOSIS — I10 Essential (primary) hypertension: Secondary | ICD-10-CM | POA: Diagnosis not present

## 2016-06-09 DIAGNOSIS — G629 Polyneuropathy, unspecified: Secondary | ICD-10-CM | POA: Diagnosis not present

## 2016-06-11 DIAGNOSIS — M1711 Unilateral primary osteoarthritis, right knee: Secondary | ICD-10-CM | POA: Diagnosis not present

## 2016-06-11 DIAGNOSIS — M6281 Muscle weakness (generalized): Secondary | ICD-10-CM | POA: Diagnosis not present

## 2016-06-11 DIAGNOSIS — M25661 Stiffness of right knee, not elsewhere classified: Secondary | ICD-10-CM | POA: Diagnosis not present

## 2016-06-11 DIAGNOSIS — M25561 Pain in right knee: Secondary | ICD-10-CM | POA: Diagnosis not present

## 2016-06-14 DIAGNOSIS — M1711 Unilateral primary osteoarthritis, right knee: Secondary | ICD-10-CM | POA: Diagnosis not present

## 2016-06-16 DIAGNOSIS — M6281 Muscle weakness (generalized): Secondary | ICD-10-CM | POA: Diagnosis not present

## 2016-06-16 DIAGNOSIS — M25661 Stiffness of right knee, not elsewhere classified: Secondary | ICD-10-CM | POA: Diagnosis not present

## 2016-06-16 DIAGNOSIS — R262 Difficulty in walking, not elsewhere classified: Secondary | ICD-10-CM | POA: Diagnosis not present

## 2016-06-16 DIAGNOSIS — M25561 Pain in right knee: Secondary | ICD-10-CM | POA: Diagnosis not present

## 2016-06-18 DIAGNOSIS — M25561 Pain in right knee: Secondary | ICD-10-CM | POA: Diagnosis not present

## 2016-06-18 DIAGNOSIS — M6281 Muscle weakness (generalized): Secondary | ICD-10-CM | POA: Diagnosis not present

## 2016-06-18 DIAGNOSIS — M1711 Unilateral primary osteoarthritis, right knee: Secondary | ICD-10-CM | POA: Diagnosis not present

## 2016-06-18 DIAGNOSIS — M25661 Stiffness of right knee, not elsewhere classified: Secondary | ICD-10-CM | POA: Diagnosis not present

## 2016-06-28 DIAGNOSIS — M1711 Unilateral primary osteoarthritis, right knee: Secondary | ICD-10-CM | POA: Diagnosis not present

## 2016-06-28 DIAGNOSIS — M6281 Muscle weakness (generalized): Secondary | ICD-10-CM | POA: Diagnosis not present

## 2016-06-28 DIAGNOSIS — M25561 Pain in right knee: Secondary | ICD-10-CM | POA: Diagnosis not present

## 2016-06-28 DIAGNOSIS — M25661 Stiffness of right knee, not elsewhere classified: Secondary | ICD-10-CM | POA: Diagnosis not present

## 2016-06-30 DIAGNOSIS — M1711 Unilateral primary osteoarthritis, right knee: Secondary | ICD-10-CM | POA: Diagnosis not present

## 2016-06-30 DIAGNOSIS — M25661 Stiffness of right knee, not elsewhere classified: Secondary | ICD-10-CM | POA: Diagnosis not present

## 2016-06-30 DIAGNOSIS — M25561 Pain in right knee: Secondary | ICD-10-CM | POA: Diagnosis not present

## 2016-06-30 DIAGNOSIS — M6281 Muscle weakness (generalized): Secondary | ICD-10-CM | POA: Diagnosis not present

## 2016-07-02 DIAGNOSIS — R262 Difficulty in walking, not elsewhere classified: Secondary | ICD-10-CM | POA: Diagnosis not present

## 2016-07-02 DIAGNOSIS — M6281 Muscle weakness (generalized): Secondary | ICD-10-CM | POA: Diagnosis not present

## 2016-07-02 DIAGNOSIS — M25661 Stiffness of right knee, not elsewhere classified: Secondary | ICD-10-CM | POA: Diagnosis not present

## 2016-07-02 DIAGNOSIS — M25561 Pain in right knee: Secondary | ICD-10-CM | POA: Diagnosis not present

## 2016-07-06 DIAGNOSIS — M25561 Pain in right knee: Secondary | ICD-10-CM | POA: Diagnosis not present

## 2016-07-06 DIAGNOSIS — R262 Difficulty in walking, not elsewhere classified: Secondary | ICD-10-CM | POA: Diagnosis not present

## 2016-07-06 DIAGNOSIS — M25661 Stiffness of right knee, not elsewhere classified: Secondary | ICD-10-CM | POA: Diagnosis not present

## 2016-07-06 DIAGNOSIS — M6281 Muscle weakness (generalized): Secondary | ICD-10-CM | POA: Diagnosis not present

## 2016-07-08 DIAGNOSIS — M25561 Pain in right knee: Secondary | ICD-10-CM | POA: Diagnosis not present

## 2016-07-08 DIAGNOSIS — R262 Difficulty in walking, not elsewhere classified: Secondary | ICD-10-CM | POA: Diagnosis not present

## 2016-07-08 DIAGNOSIS — M6281 Muscle weakness (generalized): Secondary | ICD-10-CM | POA: Diagnosis not present

## 2016-07-08 DIAGNOSIS — M25661 Stiffness of right knee, not elsewhere classified: Secondary | ICD-10-CM | POA: Diagnosis not present

## 2016-07-13 DIAGNOSIS — M25561 Pain in right knee: Secondary | ICD-10-CM | POA: Diagnosis not present

## 2016-07-14 DIAGNOSIS — R972 Elevated prostate specific antigen [PSA]: Secondary | ICD-10-CM | POA: Diagnosis not present

## 2016-07-15 DIAGNOSIS — M25661 Stiffness of right knee, not elsewhere classified: Secondary | ICD-10-CM | POA: Diagnosis not present

## 2016-07-15 DIAGNOSIS — R262 Difficulty in walking, not elsewhere classified: Secondary | ICD-10-CM | POA: Diagnosis not present

## 2016-07-15 DIAGNOSIS — M25561 Pain in right knee: Secondary | ICD-10-CM | POA: Diagnosis not present

## 2016-07-15 DIAGNOSIS — Z96651 Presence of right artificial knee joint: Secondary | ICD-10-CM | POA: Diagnosis not present

## 2016-07-20 DIAGNOSIS — M6281 Muscle weakness (generalized): Secondary | ICD-10-CM | POA: Diagnosis not present

## 2016-07-20 DIAGNOSIS — R262 Difficulty in walking, not elsewhere classified: Secondary | ICD-10-CM | POA: Diagnosis not present

## 2016-07-20 DIAGNOSIS — M25661 Stiffness of right knee, not elsewhere classified: Secondary | ICD-10-CM | POA: Diagnosis not present

## 2016-07-20 DIAGNOSIS — M25561 Pain in right knee: Secondary | ICD-10-CM | POA: Diagnosis not present

## 2016-07-21 DIAGNOSIS — Z8546 Personal history of malignant neoplasm of prostate: Secondary | ICD-10-CM | POA: Diagnosis not present

## 2016-08-24 DIAGNOSIS — M25561 Pain in right knee: Secondary | ICD-10-CM | POA: Diagnosis not present

## 2016-09-11 NOTE — Addendum Note (Signed)
Addendum  created 09/11/16 0945 by Duane Boston, MD   Sign clinical note

## 2016-12-28 DIAGNOSIS — Z96651 Presence of right artificial knee joint: Secondary | ICD-10-CM | POA: Diagnosis not present

## 2017-01-19 DIAGNOSIS — N182 Chronic kidney disease, stage 2 (mild): Secondary | ICD-10-CM | POA: Diagnosis not present

## 2017-01-19 DIAGNOSIS — N2581 Secondary hyperparathyroidism of renal origin: Secondary | ICD-10-CM | POA: Diagnosis not present

## 2017-01-25 DIAGNOSIS — N182 Chronic kidney disease, stage 2 (mild): Secondary | ICD-10-CM | POA: Diagnosis not present

## 2017-01-25 DIAGNOSIS — D631 Anemia in chronic kidney disease: Secondary | ICD-10-CM | POA: Diagnosis not present

## 2017-01-25 DIAGNOSIS — N2581 Secondary hyperparathyroidism of renal origin: Secondary | ICD-10-CM | POA: Diagnosis not present

## 2017-01-25 DIAGNOSIS — I10 Essential (primary) hypertension: Secondary | ICD-10-CM | POA: Diagnosis not present

## 2017-03-08 DIAGNOSIS — Z8546 Personal history of malignant neoplasm of prostate: Secondary | ICD-10-CM | POA: Diagnosis not present

## 2017-03-11 DIAGNOSIS — Z8546 Personal history of malignant neoplasm of prostate: Secondary | ICD-10-CM | POA: Diagnosis not present

## 2017-03-17 DIAGNOSIS — E785 Hyperlipidemia, unspecified: Secondary | ICD-10-CM | POA: Diagnosis not present

## 2017-03-17 DIAGNOSIS — Z0000 Encounter for general adult medical examination without abnormal findings: Secondary | ICD-10-CM | POA: Diagnosis not present

## 2017-03-17 DIAGNOSIS — C61 Malignant neoplasm of prostate: Secondary | ICD-10-CM | POA: Diagnosis not present

## 2017-03-17 DIAGNOSIS — D649 Anemia, unspecified: Secondary | ICD-10-CM | POA: Diagnosis not present

## 2017-03-17 DIAGNOSIS — I10 Essential (primary) hypertension: Secondary | ICD-10-CM | POA: Diagnosis not present

## 2017-04-01 DIAGNOSIS — Z0000 Encounter for general adult medical examination without abnormal findings: Secondary | ICD-10-CM | POA: Diagnosis not present

## 2017-04-01 DIAGNOSIS — D649 Anemia, unspecified: Secondary | ICD-10-CM | POA: Diagnosis not present

## 2017-04-01 DIAGNOSIS — E785 Hyperlipidemia, unspecified: Secondary | ICD-10-CM | POA: Diagnosis not present

## 2017-05-10 DIAGNOSIS — R9431 Abnormal electrocardiogram [ECG] [EKG]: Secondary | ICD-10-CM | POA: Diagnosis not present

## 2017-05-10 DIAGNOSIS — R001 Bradycardia, unspecified: Secondary | ICD-10-CM | POA: Diagnosis not present

## 2017-05-10 DIAGNOSIS — C61 Malignant neoplasm of prostate: Secondary | ICD-10-CM | POA: Diagnosis not present

## 2017-05-10 DIAGNOSIS — E785 Hyperlipidemia, unspecified: Secondary | ICD-10-CM | POA: Diagnosis not present

## 2017-05-10 DIAGNOSIS — I10 Essential (primary) hypertension: Secondary | ICD-10-CM | POA: Diagnosis not present

## 2017-05-10 DIAGNOSIS — C189 Malignant neoplasm of colon, unspecified: Secondary | ICD-10-CM | POA: Diagnosis not present

## 2017-05-11 DIAGNOSIS — I10 Essential (primary) hypertension: Secondary | ICD-10-CM | POA: Diagnosis not present

## 2017-06-13 DIAGNOSIS — N189 Chronic kidney disease, unspecified: Secondary | ICD-10-CM | POA: Diagnosis not present

## 2017-06-13 DIAGNOSIS — I10 Essential (primary) hypertension: Secondary | ICD-10-CM | POA: Diagnosis not present

## 2017-06-13 DIAGNOSIS — E785 Hyperlipidemia, unspecified: Secondary | ICD-10-CM | POA: Diagnosis not present

## 2017-06-13 DIAGNOSIS — C189 Malignant neoplasm of colon, unspecified: Secondary | ICD-10-CM | POA: Diagnosis not present

## 2017-06-13 DIAGNOSIS — C61 Malignant neoplasm of prostate: Secondary | ICD-10-CM | POA: Diagnosis not present

## 2017-06-28 DIAGNOSIS — J310 Chronic rhinitis: Secondary | ICD-10-CM | POA: Insufficient documentation

## 2017-06-28 DIAGNOSIS — J343 Hypertrophy of nasal turbinates: Secondary | ICD-10-CM | POA: Insufficient documentation

## 2017-06-28 DIAGNOSIS — J342 Deviated nasal septum: Secondary | ICD-10-CM | POA: Insufficient documentation

## 2017-06-28 DIAGNOSIS — T485X1A Poisoning by other anti-common-cold drugs, accidental (unintentional), initial encounter: Secondary | ICD-10-CM | POA: Diagnosis not present

## 2017-07-01 DIAGNOSIS — M25571 Pain in right ankle and joints of right foot: Secondary | ICD-10-CM | POA: Diagnosis not present

## 2017-07-01 DIAGNOSIS — M19071 Primary osteoarthritis, right ankle and foot: Secondary | ICD-10-CM | POA: Diagnosis not present

## 2017-07-01 DIAGNOSIS — B351 Tinea unguium: Secondary | ICD-10-CM | POA: Diagnosis not present

## 2017-07-01 DIAGNOSIS — M79671 Pain in right foot: Secondary | ICD-10-CM | POA: Diagnosis not present

## 2017-07-01 DIAGNOSIS — M65871 Other synovitis and tenosynovitis, right ankle and foot: Secondary | ICD-10-CM | POA: Diagnosis not present

## 2017-08-03 DIAGNOSIS — F329 Major depressive disorder, single episode, unspecified: Secondary | ICD-10-CM | POA: Diagnosis not present

## 2017-08-19 DIAGNOSIS — E785 Hyperlipidemia, unspecified: Secondary | ICD-10-CM | POA: Diagnosis not present

## 2017-08-19 DIAGNOSIS — N189 Chronic kidney disease, unspecified: Secondary | ICD-10-CM | POA: Diagnosis not present

## 2017-08-19 DIAGNOSIS — C61 Malignant neoplasm of prostate: Secondary | ICD-10-CM | POA: Diagnosis not present

## 2017-08-19 DIAGNOSIS — C189 Malignant neoplasm of colon, unspecified: Secondary | ICD-10-CM | POA: Diagnosis not present

## 2017-08-19 DIAGNOSIS — I129 Hypertensive chronic kidney disease with stage 1 through stage 4 chronic kidney disease, or unspecified chronic kidney disease: Secondary | ICD-10-CM | POA: Diagnosis not present

## 2017-09-07 DIAGNOSIS — F329 Major depressive disorder, single episode, unspecified: Secondary | ICD-10-CM | POA: Diagnosis not present

## 2017-10-17 DIAGNOSIS — N182 Chronic kidney disease, stage 2 (mild): Secondary | ICD-10-CM | POA: Diagnosis not present

## 2017-10-17 DIAGNOSIS — N2581 Secondary hyperparathyroidism of renal origin: Secondary | ICD-10-CM | POA: Diagnosis not present

## 2017-10-17 DIAGNOSIS — N189 Chronic kidney disease, unspecified: Secondary | ICD-10-CM | POA: Diagnosis not present

## 2017-10-19 ENCOUNTER — Emergency Department (HOSPITAL_COMMUNITY)
Admission: EM | Admit: 2017-10-19 | Discharge: 2017-10-19 | Discharge status: Against Medical Advice | Payer: Medicare Other | Source: Home / Self Care | Attending: Emergency Medicine | Admitting: Emergency Medicine

## 2017-10-19 ENCOUNTER — Encounter (HOSPITAL_COMMUNITY): Payer: Self-pay | Admitting: *Deleted

## 2017-10-19 ENCOUNTER — Emergency Department (HOSPITAL_COMMUNITY): Payer: Medicare Other

## 2017-10-19 DIAGNOSIS — F329 Major depressive disorder, single episode, unspecified: Secondary | ICD-10-CM | POA: Diagnosis not present

## 2017-10-19 DIAGNOSIS — Z7982 Long term (current) use of aspirin: Secondary | ICD-10-CM | POA: Insufficient documentation

## 2017-10-19 DIAGNOSIS — Z87891 Personal history of nicotine dependence: Secondary | ICD-10-CM | POA: Insufficient documentation

## 2017-10-19 DIAGNOSIS — J45909 Unspecified asthma, uncomplicated: Secondary | ICD-10-CM | POA: Diagnosis not present

## 2017-10-19 DIAGNOSIS — I129 Hypertensive chronic kidney disease with stage 1 through stage 4 chronic kidney disease, or unspecified chronic kidney disease: Secondary | ICD-10-CM | POA: Diagnosis not present

## 2017-10-19 DIAGNOSIS — N179 Acute kidney failure, unspecified: Secondary | ICD-10-CM | POA: Diagnosis not present

## 2017-10-19 DIAGNOSIS — I10 Essential (primary) hypertension: Secondary | ICD-10-CM | POA: Insufficient documentation

## 2017-10-19 DIAGNOSIS — Z79899 Other long term (current) drug therapy: Secondary | ICD-10-CM | POA: Insufficient documentation

## 2017-10-19 DIAGNOSIS — Z4682 Encounter for fitting and adjustment of non-vascular catheter: Secondary | ICD-10-CM | POA: Diagnosis not present

## 2017-10-19 DIAGNOSIS — K56699 Other intestinal obstruction unspecified as to partial versus complete obstruction: Secondary | ICD-10-CM | POA: Diagnosis not present

## 2017-10-19 DIAGNOSIS — K56609 Unspecified intestinal obstruction, unspecified as to partial versus complete obstruction: Secondary | ICD-10-CM | POA: Insufficient documentation

## 2017-10-19 DIAGNOSIS — R112 Nausea with vomiting, unspecified: Secondary | ICD-10-CM | POA: Diagnosis not present

## 2017-10-19 DIAGNOSIS — K5651 Intestinal adhesions [bands], with partial obstruction: Secondary | ICD-10-CM | POA: Diagnosis not present

## 2017-10-19 DIAGNOSIS — R109 Unspecified abdominal pain: Secondary | ICD-10-CM | POA: Diagnosis not present

## 2017-10-19 DIAGNOSIS — N189 Chronic kidney disease, unspecified: Secondary | ICD-10-CM | POA: Diagnosis not present

## 2017-10-19 LAB — LIPASE, BLOOD: Lipase: 40 U/L (ref 11–51)

## 2017-10-19 LAB — COMPREHENSIVE METABOLIC PANEL
ALK PHOS: 76 U/L (ref 38–126)
ALT: 21 U/L (ref 0–44)
AST: 27 U/L (ref 15–41)
Albumin: 4.3 g/dL (ref 3.5–5.0)
Anion gap: 10 (ref 5–15)
BUN: 19 mg/dL (ref 8–23)
CALCIUM: 9.6 mg/dL (ref 8.9–10.3)
CO2: 23 mmol/L (ref 22–32)
CREATININE: 1.86 mg/dL — AB (ref 0.61–1.24)
Chloride: 108 mmol/L (ref 98–111)
GFR, EST AFRICAN AMERICAN: 40 mL/min — AB (ref >60)
GFR, EST NON AFRICAN AMERICAN: 34 mL/min — AB (ref >60)
Glucose, Bld: 135 mg/dL — ABNORMAL HIGH (ref 70–99)
Potassium: 4 mmol/L (ref 3.5–5.1)
Sodium: 141 mmol/L (ref 135–145)
TOTAL PROTEIN: 7.8 g/dL (ref 6.5–8.1)
Total Bilirubin: 0.8 mg/dL (ref 0.3–1.2)

## 2017-10-19 LAB — URINALYSIS, ROUTINE W REFLEX MICROSCOPIC
Bilirubin Urine: NEGATIVE
Glucose, UA: NEGATIVE mg/dL
HGB URINE DIPSTICK: NEGATIVE
Ketones, ur: NEGATIVE mg/dL
LEUKOCYTES UA: NEGATIVE
Nitrite: NEGATIVE
PROTEIN: NEGATIVE mg/dL
Specific Gravity, Urine: 1.017 (ref 1.005–1.030)
pH: 5 (ref 5.0–8.0)

## 2017-10-19 LAB — CBC
HCT: 38.5 % — ABNORMAL LOW (ref 39.0–52.0)
Hemoglobin: 12.5 g/dL — ABNORMAL LOW (ref 13.0–17.0)
MCH: 28.4 pg (ref 26.0–34.0)
MCHC: 32.5 g/dL (ref 30.0–36.0)
MCV: 87.5 fL (ref 78.0–100.0)
PLATELETS: 274 10*3/uL (ref 150–400)
RBC: 4.4 MIL/uL (ref 4.22–5.81)
RDW: 13.5 % (ref 11.5–15.5)
WBC: 12.6 10*3/uL — ABNORMAL HIGH (ref 4.0–10.5)

## 2017-10-19 MED ORDER — IOPAMIDOL (ISOVUE-300) INJECTION 61%
80.0000 mL | Freq: Once | INTRAVENOUS | Status: AC | PRN
  Administered 2017-10-19: 80 mL via INTRAVENOUS

## 2017-10-19 MED ORDER — LORAZEPAM 2 MG/ML IJ SOLN
1.0000 mg | Freq: Once | INTRAMUSCULAR | Status: AC
  Administered 2017-10-19: 1 mg via INTRAVENOUS
  Filled 2017-10-19: qty 1

## 2017-10-19 MED ORDER — ONDANSETRON 4 MG PO TBDP
4.0000 mg | ORAL_TABLET | Freq: Once | ORAL | Status: AC | PRN
  Administered 2017-10-19: 4 mg via ORAL
  Filled 2017-10-19: qty 1

## 2017-10-19 MED ORDER — MORPHINE SULFATE (PF) 4 MG/ML IV SOLN
4.0000 mg | Freq: Once | INTRAVENOUS | Status: AC
  Administered 2017-10-19: 4 mg via INTRAVENOUS
  Filled 2017-10-19: qty 1

## 2017-10-19 MED ORDER — ONDANSETRON HCL 4 MG/2ML IJ SOLN
4.0000 mg | Freq: Once | INTRAMUSCULAR | Status: AC
  Administered 2017-10-19: 4 mg via INTRAVENOUS
  Filled 2017-10-19: qty 2

## 2017-10-19 MED ORDER — IOPAMIDOL (ISOVUE-300) INJECTION 61%
INTRAVENOUS | Status: AC
  Filled 2017-10-19: qty 100

## 2017-10-19 MED ORDER — LIDOCAINE HCL URETHRAL/MUCOSAL 2 % EX GEL
1.0000 "application " | Freq: Once | CUTANEOUS | Status: AC
  Administered 2017-10-19: 1 via TOPICAL
  Filled 2017-10-19: qty 5

## 2017-10-19 NOTE — ED Notes (Signed)
Pt was seen trying to leave the ED. Pt stated that he was tired of waiting and wanted to see his MD to do the surgery. I explained to the pt that with his obstruction there could be a risk of his bowel becoming ischemic and eventually dying pt walked off and stated that he was leaving. Wife followed pt. IV was removed by Estill Bamberg, RN. Bleeding controlled.

## 2017-10-19 NOTE — ED Triage Notes (Addendum)
Pt to ED by POV from home with c/o of abdominal pain RLQ radiating from belly button that started this morning. Pt started vomiting at 1700 with nausea as well as light brown watery diarrhea.  Pt states he has vomited yellow bile 3x since been here. Pt states pain is 10/10 and has hx of HTN

## 2017-10-19 NOTE — H&P (Signed)
George Tawil. is an 72 y.o. male.   Chief Complaint: N/V HPI: Pt developed progressively worsening RLQ pain today starting at 9 am that was followed by nausea and vomiting (bilious).  Pt has a h/o R colectomy 10 yrs ago for colon cancer.  He also has had prostate cancer treated with radiation.  Past Medical History:  Diagnosis Date  . Anxiety   . Arthritis    OSTEO IN KNEE  . Asthma    as child  . Colon cancer ([place])   . Depression   . Elevated prostate specific antigen (PSA)   . Generalized anxiety disorder 04/09/2015  . Hematospermia   . Hx antineoplastic chemotherapy 2010  . Hypertension   . 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 Incomplete bladder emptying   . Malignant neoplasm of prostate ([place])   . Nodular prostate with lower urinary tract symptoms   . Panic attacks 04/09/2015  . Primary localized osteoarthrosis of the knee, right 05/19/2016  . Prostatitis   . S/P total knee arthroplasty, left 05/19/2016  . Spermatocele   . Weak urinary stream     Past Surgical History:  Procedure Laterality Date  . COLON RESECTION   NOV 2009  . COLONOSCOPY    . PROSTATE BIOPSY    . SPERMATOCELECTOMY Left 05/07/2014   Procedure: LEFT SPERMATOCELECTOMY;  Surgeon: Malka So, MD;  Location: [facility];  Service: Urology;  Laterality: Left;  . TONSILLECTOMY    . TOTAL KNEE ARTHROPLASTY Left 04/21/2015   Procedure: LEFT TOTAL KNEE ARTHROPLASTY;  Surgeon: Elsie Saas, MD;  Location: [place];  Service: Orthopedics;  Laterality: Left;  . TOTAL KNEE ARTHROPLASTY Right 05/31/2016   Procedure: TOTAL KNEE ARTHROPLASTY;  Surgeon: Elsie Saas, MD;  Location: [place];  Service: Orthopedics;  Laterality: Right;  . TRANSURETHRAL RESECTION OF PROSTATE N/A 05/07/2014   Procedure: TRANSURETHRAL RESECTION OF THE PROSTATE WITH GYRUS INSTRUMENTS;  Surgeon: Malka So, MD;  Location[facility];  Service: Urology;  Laterality: N/A;    Family History  Problem Relation Age of Onset   . Stroke Father   . Cancer Brother        neoplasm of brain   Social History:  reports that he quit smoking about 45 years ago. His smoking use included cigarettes. He has a 0.75 pack-year smoking history. He has never used smokeless tobacco. He reports that he drinks alcohol. He reports that he does not use drugs.  Allergies:  Allergies  Allergen Reactions  . Amlodipine     Dizziness      (Not in a hospital admission)  Results for orders placed or performed during the hospital encounter of 10/19/17 (from the past 48 hour(s))  Urinalysis, Routine w reflex microscopic     Status: None   Collection Time: 10/19/17  7:22 PM  Result Value Ref Range   Color, Urine YELLOW YELLOW   APPearance CLEAR CLEAR   Specific Gravity, Urine 1.017 1.005 - 1.030   pH 5.0 5.0 - 8.0   Glucose, UA NEGATIVE NEGATIVE mg/dL   Hgb urine dipstick NEGATIVE NEGATIVE   Bilirubin Urine NEGATIVE NEGATIVE   Ketones, ur NEGATIVE NEGATIVE mg/dL   Protein, ur NEGATIVE NEGATIVE mg/dL   Nitrite NEGATIVE NEGATIVE   Leukocytes, UA NEGATIVE NEGATIVE    Comment: Performed at [facility], [place] [address], [place], [place] [address]  Lipase, blood     Status: None   Collection Time: 10/19/17  7:55  PM  Result Value Ref Range   Lipase 40 11 - 51 U/L    Comment: Performed at [facility], [place] [address] [place], [place] [address]  Comprehensive metabolic panel     Status: Abnormal   Collection Time: 10/19/17  7:55 PM  Result Value Ref Range   Sodium 141 135 - 145 mmol/L   Potassium 4.0 3.5 - 5.1 mmol/L   Chloride 108 98 - 111 mmol/L    Comment: Please note change in reference range.   CO2 23 22 - 32 mmol/L   Glucose, Bld 135 (H) 70 - 99 mg/dL    Comment: Please note change in reference range.   BUN 19 8 - 23 mg/dL    Comment: Please note change in reference range.   Creatinine, Ser 1.86 (H) 0.61 - 1.24 mg/dL   Calcium 9.6 8.9 - 10.3 mg/dL   Total Protein 7.8 6.5 - 8.1 g/dL    Albumin 4.3 3.5 - 5.0 g/dL   AST 27 15 - 41 U/L   ALT 21 0 - 44 U/L    Comment: Please note change in reference range.   Alkaline Phosphatase 76 38 - 126 U/L   Total Bilirubin 0.8 0.3 - 1.2 mg/dL   GFR calc non Af Amer 34 (L) >60 mL/min   GFR calc Af Amer 40 (L) >60 mL/min    Comment: (NOTE) The eGFR has been calculated using the CKD EPI equation. This calculation has not been validated in all clinical situations. eGFR's persistently <60 mL/min signify possible Chronic Kidney Disease.    Anion gap 10 5 - 15    Comment: Performed at [facility], [place] [address], [place], [place] [address]  CBC     Status: Abnormal   Collection Time: 10/19/17  7:55 PM  Result Value Ref Range   WBC 12.6 (H) 4.0 - 10.5 K/uL   RBC 4.40 4.22 - 5.81 MIL/uL   Hemoglobin 12.5 (L) 13.0 - 17.0 g/dL   HCT 38.5 (L) 39.0 - 52.0 %   MCV 87.5 78.0 - 100.0 fL   MCH 28.4 26.0 - 34.0 pg   MCHC 32.5 30.0 - 36.0 g/dL   RDW 13.5 11.5 - 15.5 %   Platelets 274 150 - 400 K/uL    Comment: Performed at [facility], [place] [address], [place], [place] [address]   Ct Abdomen Pelvis W Contrast  Result Date: 10/19/2017 CLINICAL DATA:  Generalized acute abdominal pain EXAM: CT ABDOMEN AND PELVIS WITH CONTRAST TECHNIQUE: Multidetector CT imaging of the abdomen and pelvis was performed using the standard protocol following bolus administration of intravenous contrast. CONTRAST:  7m ISOVUE-300 IOPAMIDOL (ISOVUE-300) INJECTION 61% COMPARISON:  09/15/2009 FINDINGS: Lower chest:  No acute finding Hepatobiliary: Numerous low densities within the liver that are chronic based on remote imaging.No evidence of biliary obstruction or stone. Pancreas: Generalized atrophy.  No acute finding Spleen: Subtle subcentimeter low densities, chronic and benign. Adrenals/Urinary Tract: Negative adrenals. No hydronephrosis or stone. Unremarkable bladder. Stomach/Bowel: Dilated and fluid-filled small bowel with  transition point at a kink anterior to the transverse colon, presumably adhesion. The obstruction is high-grade with decompressed bowel distally. No evidence of bowel ischemia. Right hemicolectomy. Vascular/Lymphatic: No acute finding. Minimal atheromatous change for age. No mass or adenopathy. Reproductive:Fiducial markers of the prostate. Other: No ascites or pneumoperitoneum. Musculoskeletal: No acute abnormalities. IMPRESSION: 1. High-grade small bowel obstruction with transition point in the ventral abdomen where there is kinked loop suggesting adhesive disease.  2. Right hemicolectomy. Electronically Signed   By: Monte Fantasia M.D.   On: 10/19/2017 21:11    Review of Systems  Constitutional: Negative for chills and fever.  Eyes: Negative for blurred vision.  Respiratory: Negative for cough and shortness of breath.   Cardiovascular: Negative for chest pain and palpitations.  Gastrointestinal: Positive for abdominal pain, diarrhea, nausea and vomiting.  Genitourinary: Negative for dysuria, frequency and urgency.  Skin: Negative for itching and rash.  Neurological: Negative for dizziness and headaches.    Blood pressure 133/90, pulse 60, temperature 98.1 F (36.7 C), temperature source Oral, resp. rate 18, height 6' (1.829 m), weight 90.7 kg (200 lb), SpO2 98 %. Physical Exam  Constitutional: He is oriented to person, place, and time. He appears well-developed and well-nourished.  HENT:  Head: Normocephalic and atraumatic.  Eyes: Pupils are equal, round, and reactive to light. Conjunctivae and EOM are normal.  Neck: Normal range of motion. Neck supple.  Cardiovascular: Normal rate and regular rhythm.  Respiratory: Effort normal. No respiratory distress.  GI: Soft. He exhibits distension. There is tenderness (mid-epigastric). There is no rebound and no guarding.  Musculoskeletal: Normal range of motion.  Neurological: He is alert and oriented to person, place, and time.  Skin: Skin is  warm and dry.     Assessment/Plan 72 y.o. M with high grade sbo most likely due to adhesive disease.  Insert NG and admit to the floor.  NPO and IVF's.  Will start SBO protocol in AM.    Rosario Adie., MD 5/57/3220, 10:03 PM

## 2017-10-19 NOTE — ED Notes (Signed)
Pt was seen trying to leave the ED, pt was stopped and patient said that he was tired of waiting and was going to see his primary doctor and let him do the surgery, it was explained that primary doctors don't do surgery and he would have to do this process all over again, pt insisted on leaving and not waiting anymore Pt's IV was removed and they exited the ED

## 2017-10-19 NOTE — ED Provider Notes (Addendum)
[facility] DEPT Provider Note   CSN: [id number] Arrival date & time: 10/19/17  1734     History   Chief Complaint Chief Complaint  Patient presents with  . Emesis  . Abdominal Pain    HPI George [name]. is a 72 y.o. male.  Pt presents to the ED today with n/v and abdominal pain.  Th ept said pain started this morning and n/v started around 1700.  The pt has never had anything like this in the past.  No f/c.  Pt does have a hx of right hemicolectomy for colon cancer about 10 years ago.  Otherwise, no other abdominal surgeries.     Past Medical History:  Diagnosis Date  . Anxiety   . Arthritis    OSTEO IN KNEE  . Asthma    as child  . Colon cancer ([place])   . Depression   . Elevated prostate specific antigen (PSA)   . Generalized anxiety disorder 04/09/2015  . Hematospermia   . Hx antineoplastic chemotherapy 2010  . Hypertension   . 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 Incomplete bladder emptying   . Malignant neoplasm of prostate ([place])   . Nodular prostate with lower urinary tract symptoms   . Panic attacks 04/09/2015  . Primary localized osteoarthrosis of the knee, right 05/19/2016  . Prostatitis   . S/P total knee arthroplasty, left 05/19/2016  . Spermatocele   . Weak urinary stream     Patient Active Problem List   Diagnosis Date Noted  . Primary localized osteoarthritis of right knee 05/31/2016  . Primary localized osteoarthrosis of the knee, right 05/19/2016  . S/P total knee arthroplasty, left 05/19/2016  . Dyspnea 01/20/2016  . DJD (degenerative joint disease) of knee 04/21/2015  . Primary localized osteoarthritis of left knee 04/09/2015  . Generalized anxiety disorder 04/09/2015  . Panic attacks 04/09/2015  . Spermatocele 05/08/2014  . Benign localized hyperplasia of prostate with urinary obstruction 05/07/2014  . Malignant neoplasm of prostate ([place]) 04/22/2014    Past Surgical  History:  Procedure Laterality Date  . COLON RESECTION   NOV 2009  . COLONOSCOPY    . PROSTATE BIOPSY    . SPERMATOCELECTOMY Left 05/07/2014   Procedure: LEFT SPERMATOCELECTOMY;  Surgeon: Malka So, MD;  [facility];  Service: Urology;  Laterality: Left;  . TONSILLECTOMY    . TOTAL KNEE ARTHROPLASTY Left 04/21/2015   Procedure: LEFT TOTAL KNEE ARTHROPLASTY;  Surgeon: Elsie Saas, MD;  Location: [place];  Service: Orthopedics;  Laterality: Left;  . TOTAL KNEE ARTHROPLASTY Right 05/31/2016   Procedure: TOTAL KNEE ARTHROPLASTY;  Surgeon: Elsie Saas, MD;  Location: [place];  Service: Orthopedics;  Laterality: Right;  . TRANSURETHRAL RESECTION OF PROSTATE N/A 05/07/2014   Procedure: TRANSURETHRAL RESECTION OF THE PROSTATE WITH GYRUS INSTRUMENTS;  Surgeon: Malka So, MD;  [facility];  Service: Urology;  Laterality: N/A;        Home Medications    Prior to Admission medications   Medication Sig Start Date End Date Taking? Authorizing Provider  atorvastatin (LIPITOR) 20 MG tablet Take 20 mg by mouth daily.   Yes [provider]  buPROPion (WELLBUTRIN XL) 150 MG 24 hr tablet Take 450 mg by mouth every morning.    Yes [provider]  felodipine (PLENDIL) 10 MG 24 hr tablet Take 1 tablet by mouth daily. 11/21/15  Yes [provider]  gabapentin (NEURONTIN) 800 MG  tablet Take 800 mg by mouth 3 (three) times daily.   Yes [provider]  losartan (COZAAR) 100 MG tablet Take 100 mg by mouth daily.   Yes [provider]  metoprolol succinate (TOPROL-XL) 100 MG 24 hr tablet Take 100 mg by mouth daily. Take with or immediately following a meal.   Yes [provider]  metoprolol succinate (TOPROL-XL) 50 MG 24 hr tablet Take 50 mg by mouth daily. Take with or immediately following a meal.   Yes [provider]  potassium chloride SA (K-DUR,KLOR-CON) 20 MEQ tablet Take 40 mEq by mouth daily.  09/01/14  Yes [provider]    spironolactone (ALDACTONE) 25 MG tablet Take 25 mg by mouth daily.   Yes [provider]  Vitamin D, Ergocalciferol, (DRISDOL) 50000 units CAPS capsule Take 50,000 Units by mouth every 7 (seven) days.   Yes [provider]  acetaminophen (TYLENOL) 325 MG tablet Take 2 tablets (650 mg total) by mouth every 6 (six) hours as needed for mild pain (or Fever >/= 101). Patient not taking: Reported on 10/19/2017 06/01/16   Matthew Saras, PA-C  aspirin EC 325 MG EC tablet 1 tab a day for the next 30 days to prevent blood clots Patient not taking: Reported on 10/19/2017 06/01/16   Shepperson, Kirstin, PA-C  cephALEXin (KEFLEX) 500 MG capsule Take 1 capsule (500 mg total) by mouth every 6 (six) hours. Patient not taking: Reported on 10/19/2017 06/02/16   Shepperson, Kirstin, PA-C  docusate sodium (COLACE) 100 MG capsule 1 tab 2 times a day while on narcotics.  STOOL SOFTENER Patient not taking: Reported on 10/19/2017 06/01/16   Shepperson, Kirstin, PA-C  ondansetron (ZOFRAN) 4 MG tablet Take 1 tablet (4 mg total) by mouth every 6 (six) hours as needed for nausea. Patient not taking: Reported on 10/19/2017 06/02/16   Matthew Saras, PA-C  oxyCODONE (ROXICODONE) 15 MG immediate release tablet 1 tablet every 3-4 hrs as needed for pain Patient not taking: Reported on 10/19/2017 06/01/16   Shepperson, Kirstin, PA-C  polyethylene glycol (MIRALAX / GLYCOLAX) packet 17grams in 6 oz of water twice a day until bowel movement.  LAXITIVE.  Restart if two days since last bowel movement Patient not taking: Reported on 10/19/2017 06/01/16   Matthew Saras, PA-C    Family History Family History  Problem Relation Age of Onset  . Stroke Father   . Cancer Brother        neoplasm of brain    Social History Social History   Tobacco Use  . Smoking status: Former Smoker    Packs/day: 0.25    Years: 3.00    Pack years: 0.75    Types: Cigarettes    Last attempt to quit: 04/12/1972    Years since  quitting: 45.5  . Smokeless tobacco: Never Used  . Tobacco comment: smoked 1 cigarette per day SOME DAYS  Substance Use Topics  . Alcohol use: Yes    Alcohol/week: 0.0 oz    Comment: OCCASIONAL  . Drug use: No     Allergies   Amlodipine   Review of Systems Review of Systems  Gastrointestinal: Positive for abdominal pain, nausea and vomiting.  All other systems reviewed and are negative.    Physical Exam Updated Vital Signs BP 133/90   Pulse 60   Temp 98.1 F (36.7 C) (Oral)   Resp 18   Ht 6' (1.829 m)   Wt 90.7 kg (200 lb)   SpO2 98%   BMI 27.12  kg/m   Physical Exam  Constitutional: He appears well-developed and well-nourished.  HENT:  Head: Normocephalic and atraumatic.  Mouth/Throat: Oropharynx is clear and moist.  Eyes: Pupils are equal, round, and reactive to light. EOM are normal.  Cardiovascular: Normal rate, regular rhythm, normal heart sounds and intact distal pulses.  Pulmonary/Chest: Effort normal and breath sounds normal.  Abdominal: Normal appearance. He exhibits distension. There is tenderness in the epigastric area.  Neurological: He is alert.  Skin: Skin is warm and dry. Capillary refill takes less than 2 seconds.  Psychiatric: He has a normal mood and affect. His behavior is normal.  Nursing note and vitals reviewed.    ED Treatments / Results  Labs (all labs ordered are listed, but only abnormal results are displayed) Labs Reviewed  COMPREHENSIVE METABOLIC PANEL - Abnormal; Notable for the following components:      Result Value   Glucose, Bld 135 (*)    Creatinine, Ser 1.86 (*)    GFR calc non Af Amer 34 (*)    GFR calc Af Amer 40 (*)    All other components within normal limits  CBC - Abnormal; Notable for the following components:   WBC 12.6 (*)    Hemoglobin 12.5 (*)    HCT 38.5 (*)    All other components within normal limits  LIPASE, BLOOD  URINALYSIS, ROUTINE W REFLEX MICROSCOPIC    EKG None  Radiology Ct Abdomen  Pelvis W Contrast  Result Date: 10/19/2017 CLINICAL DATA:  Generalized acute abdominal pain EXAM: CT ABDOMEN AND PELVIS WITH CONTRAST TECHNIQUE: Multidetector CT imaging of the abdomen and pelvis was performed using the standard protocol following bolus administration of intravenous contrast. CONTRAST:  44mL ISOVUE-300 IOPAMIDOL (ISOVUE-300) INJECTION 61% COMPARISON:  09/15/2009 FINDINGS: Lower chest:  No acute finding Hepatobiliary: Numerous low densities within the liver that are chronic based on remote imaging.No evidence of biliary obstruction or stone. Pancreas: Generalized atrophy.  No acute finding Spleen: Subtle subcentimeter low densities, chronic and benign. Adrenals/Urinary Tract: Negative adrenals. No hydronephrosis or stone. Unremarkable bladder. Stomach/Bowel: Dilated and fluid-filled small bowel with transition point at a kink anterior to the transverse colon, presumably adhesion. The obstruction is high-grade with decompressed bowel distally. No evidence of bowel ischemia. Right hemicolectomy. Vascular/Lymphatic: No acute finding. Minimal atheromatous change for age. No mass or adenopathy. Reproductive:Fiducial markers of the prostate. Other: No ascites or pneumoperitoneum. Musculoskeletal: No acute abnormalities. IMPRESSION: 1. High-grade small bowel obstruction with transition point in the ventral abdomen where there is kinked loop suggesting adhesive disease. 2. Right hemicolectomy. Electronically Signed   By: Monte Fantasia M.D.   On: 10/19/2017 21:11   Dg Abd Portable 1 View  Addendum Date: 10/19/2017   ADDENDUM REPORT: 10/19/2017 23:24 ADDENDUM: Acute findings discussed with and reconfirmed by Dr.Samyiah Halvorsen on 10/19/2017 at 11:20 pm. Electronically Signed   By: Elon Alas M.D.   On: 10/19/2017 23:24   Result Date: 10/19/2017 CLINICAL DATA:  Nasogastric tube position. EXAM: PORTABLE ABDOMEN - 1 VIEW COMPARISON:  CT abdomen and pelvis October 19, 2017 FINDINGS: Nasogastric tube  looped in region of distal esophagus, tip out of field of view. Gas distended small bowel measuring to 4 cm. Gas within nondistended large bowel. No intra-abdominal mass effect. Soft tissue planes and included osseous structures are unchanged. IMPRESSION: Nasogastric tube looped in region of distal esophagus. Recommend redirection. Electronically Signed: By: Elon Alas M.D. On: 10/19/2017 23:16    Procedures Procedures (including critical care time)  Medications Ordered in ED  Medications  iopamidol (ISOVUE-300) 61 % injection (has no administration in time range)  ondansetron (ZOFRAN-ODT) disintegrating tablet 4 mg (4 mg Oral Given 10/19/17 1835)  morphine 4 MG/ML injection 4 mg (4 mg Intravenous Given 10/19/17 1944)  ondansetron (ZOFRAN) injection 4 mg (4 mg Intravenous Given 10/19/17 1944)  iopamidol (ISOVUE-300) 61 % injection 80 mL (80 mLs Intravenous Contrast Given 10/19/17 2044)  LORazepam (ATIVAN) injection 1 mg (1 mg Intravenous Given 10/19/17 2214)  lidocaine (XYLOCAINE) 2 % jelly 1 application (1 application Topical Given 10/19/17 2214)     Initial Impression / Assessment and Plan / ED Course  I have reviewed the triage vital signs and the nursing notes.  Pertinent labs & imaging results that were available during my care of the patient were reviewed by me and considered in my medical decision making (see chart for details).    Pt is feeling better after IVFs, morphine, and zofran.  He was d/w Dr. Marcello Moores (surgery) who will admit pt.  She requested a NG tube which will be placed.  Charge nurse told me pt upset about wait and he walked out.  I did not see him leave and he was gone before I could talk to him.  I did call him and leave a message on his cell phone to try to get him to come back and told him his problem will not resolve on its own.  Final Clinical Impressions(s) / ED Diagnoses   Final diagnoses:  SBO (small bowel obstruction) [facility])    ED Discharge Orders     None       Isla Pence, MD 10/19/17 [id number]    Isla Pence, MD 10/19/17 2351

## 2017-10-19 NOTE — ED Notes (Signed)
This RN attempted to resituate the pts NG tube, pt could not tolerate this and proceeded to pull the NG tube out and stated, "Id rather have surgery."

## 2017-10-20 ENCOUNTER — Encounter (HOSPITAL_COMMUNITY): Payer: Self-pay | Admitting: Emergency Medicine

## 2017-10-20 ENCOUNTER — Emergency Department (HOSPITAL_COMMUNITY): Payer: Medicare Other

## 2017-10-20 ENCOUNTER — Other Ambulatory Visit: Payer: Self-pay

## 2017-10-20 ENCOUNTER — Inpatient Hospital Stay (HOSPITAL_COMMUNITY)
Admission: EM | Admit: 2017-10-20 | Discharge: 2017-10-22 | DRG: 389 | Disposition: A | Discharge status: Home / Self Care | Payer: Medicare Other | Attending: General Surgery | Admitting: General Surgery

## 2017-10-20 ENCOUNTER — Inpatient Hospital Stay (HOSPITAL_COMMUNITY): Payer: Medicare Other

## 2017-10-20 DIAGNOSIS — K5651 Intestinal adhesions [bands], with partial obstruction: Secondary | ICD-10-CM | POA: Diagnosis not present

## 2017-10-20 DIAGNOSIS — Z85038 Personal history of other malignant neoplasm of large intestine: Secondary | ICD-10-CM | POA: Diagnosis not present

## 2017-10-20 DIAGNOSIS — K56609 Unspecified intestinal obstruction, unspecified as to partial versus complete obstruction: Principal | ICD-10-CM | POA: Diagnosis present

## 2017-10-20 DIAGNOSIS — I129 Hypertensive chronic kidney disease with stage 1 through stage 4 chronic kidney disease, or unspecified chronic kidney disease: Secondary | ICD-10-CM | POA: Diagnosis present

## 2017-10-20 DIAGNOSIS — Z888 Allergy status to other drugs, medicaments and biological substances status: Secondary | ICD-10-CM | POA: Diagnosis not present

## 2017-10-20 DIAGNOSIS — Z9221 Personal history of antineoplastic chemotherapy: Secondary | ICD-10-CM | POA: Diagnosis not present

## 2017-10-20 DIAGNOSIS — Z9049 Acquired absence of other specified parts of digestive tract: Secondary | ICD-10-CM | POA: Diagnosis not present

## 2017-10-20 DIAGNOSIS — Z79899 Other long term (current) drug therapy: Secondary | ICD-10-CM | POA: Diagnosis not present

## 2017-10-20 DIAGNOSIS — G609 Hereditary and idiopathic neuropathy, unspecified: Secondary | ICD-10-CM | POA: Diagnosis present

## 2017-10-20 DIAGNOSIS — Z96653 Presence of artificial knee joint, bilateral: Secondary | ICD-10-CM | POA: Diagnosis present

## 2017-10-20 DIAGNOSIS — Z9079 Acquired absence of other genital organ(s): Secondary | ICD-10-CM

## 2017-10-20 DIAGNOSIS — F329 Major depressive disorder, single episode, unspecified: Secondary | ICD-10-CM | POA: Diagnosis present

## 2017-10-20 DIAGNOSIS — Z8719 Personal history of other diseases of the digestive system: Secondary | ICD-10-CM | POA: Diagnosis not present

## 2017-10-20 DIAGNOSIS — Z8546 Personal history of malignant neoplasm of prostate: Secondary | ICD-10-CM | POA: Diagnosis not present

## 2017-10-20 DIAGNOSIS — K5900 Constipation, unspecified: Secondary | ICD-10-CM | POA: Diagnosis not present

## 2017-10-20 DIAGNOSIS — N189 Chronic kidney disease, unspecified: Secondary | ICD-10-CM | POA: Diagnosis present

## 2017-10-20 DIAGNOSIS — J45909 Unspecified asthma, uncomplicated: Secondary | ICD-10-CM | POA: Diagnosis present

## 2017-10-20 DIAGNOSIS — Z87891 Personal history of nicotine dependence: Secondary | ICD-10-CM

## 2017-10-20 DIAGNOSIS — F411 Generalized anxiety disorder: Secondary | ICD-10-CM | POA: Diagnosis present

## 2017-10-20 DIAGNOSIS — N179 Acute kidney failure, unspecified: Secondary | ICD-10-CM | POA: Diagnosis present

## 2017-10-20 DIAGNOSIS — Z923 Personal history of irradiation: Secondary | ICD-10-CM

## 2017-10-20 DIAGNOSIS — R109 Unspecified abdominal pain: Secondary | ICD-10-CM | POA: Diagnosis not present

## 2017-10-20 LAB — COMPREHENSIVE METABOLIC PANEL
ALBUMIN: 4.4 g/dL (ref 3.5–5.0)
ALT: 19 U/L (ref 0–44)
ANION GAP: 9 (ref 5–15)
AST: 24 U/L (ref 15–41)
Alkaline Phosphatase: 74 U/L (ref 38–126)
BUN: 23 mg/dL (ref 8–23)
CHLORIDE: 106 mmol/L (ref 98–111)
CO2: 26 mmol/L (ref 22–32)
Calcium: 9.9 mg/dL (ref 8.9–10.3)
Creatinine, Ser: 2.11 mg/dL — ABNORMAL HIGH (ref 0.61–1.24)
GFR calc Af Amer: 34 mL/min — ABNORMAL LOW (ref >60)
GFR calc non Af Amer: 29 mL/min — ABNORMAL LOW (ref >60)
GLUCOSE: 125 mg/dL — AB (ref 70–99)
POTASSIUM: 3.8 mmol/L (ref 3.5–5.1)
SODIUM: 141 mmol/L (ref 135–145)
Total Bilirubin: 0.6 mg/dL (ref 0.3–1.2)
Total Protein: 8 g/dL (ref 6.5–8.1)

## 2017-10-20 LAB — CBC WITH DIFFERENTIAL/PLATELET
Basophils Absolute: 0 10*3/uL (ref 0.0–0.1)
Basophils Relative: 0 %
Eosinophils Absolute: 0.1 10*3/uL (ref 0.0–0.7)
Eosinophils Relative: 1 %
HCT: 38.8 % — ABNORMAL LOW (ref 39.0–52.0)
Hemoglobin: 12.5 g/dL — ABNORMAL LOW (ref 13.0–17.0)
Lymphocytes Relative: 12 %
Lymphs Abs: 1.4 10*3/uL (ref 0.7–4.0)
MCH: 28.5 pg (ref 26.0–34.0)
MCHC: 32.2 g/dL (ref 30.0–36.0)
MCV: 88.4 fL (ref 78.0–100.0)
Monocytes Absolute: 0.7 10*3/uL (ref 0.1–1.0)
Monocytes Relative: 6 %
Neutro Abs: 9.8 10*3/uL — ABNORMAL HIGH (ref 1.7–7.7)
Neutrophils Relative %: 81 %
Platelets: 300 10*3/uL (ref 150–400)
RBC: 4.39 MIL/uL (ref 4.22–5.81)
RDW: 13.7 % (ref 11.5–15.5)
WBC: 12 10*3/uL — ABNORMAL HIGH (ref 4.0–10.5)

## 2017-10-20 LAB — LIPASE, BLOOD: Lipase: 35 U/L (ref 11–51)

## 2017-10-20 MED ORDER — ONDANSETRON HCL 4 MG/2ML IJ SOLN
4.0000 mg | Freq: Once | INTRAMUSCULAR | Status: AC
  Administered 2017-10-20: 4 mg via INTRAVENOUS
  Filled 2017-10-20: qty 2

## 2017-10-20 MED ORDER — ONDANSETRON HCL 4 MG/2ML IJ SOLN: 4.0000 mg | Freq: Four times a day (QID) | INTRAMUSCULAR | Status: DC | PRN

## 2017-10-20 MED ORDER — ORAL CARE MOUTH RINSE
15.0000 mL | Freq: Two times a day (BID) | OROMUCOSAL | Status: DC
  Administered 2017-10-20 – 2017-10-21 (×2): 15 mL via OROMUCOSAL

## 2017-10-20 MED ORDER — DIATRIZOATE MEGLUMINE & SODIUM 66-10 % PO SOLN
90.0000 mL | Freq: Once | ORAL | Status: AC
  Administered 2017-10-21: 90 mL via NASOGASTRIC
  Filled 2017-10-20: qty 90

## 2017-10-20 MED ORDER — KCL IN DEXTROSE-NACL 20-5-0.45 MEQ/L-%-% IV SOLN
INTRAVENOUS | Status: DC
  Administered 2017-10-20 – 2017-10-22 (×5): via INTRAVENOUS
  Filled 2017-10-20 (×4): qty 1000

## 2017-10-20 MED ORDER — METOPROLOL TARTRATE 5 MG/5ML IV SOLN: 5.0000 mg | Freq: Four times a day (QID) | INTRAVENOUS | Status: DC | PRN

## 2017-10-20 MED ORDER — PHENOL 1.4 % MT LIQD
1.0000 | OROMUCOSAL | Status: DC | PRN
  Administered 2017-10-20: 1 via OROMUCOSAL
  Filled 2017-10-20: qty 177

## 2017-10-20 MED ORDER — ONDANSETRON 4 MG PO TBDP: 4.0000 mg | ORAL_TABLET | Freq: Four times a day (QID) | ORAL | Status: DC | PRN

## 2017-10-20 MED ORDER — IOPAMIDOL (ISOVUE-300) INJECTION 61%
INTRAVENOUS | Status: AC
  Filled 2017-10-20: qty 50

## 2017-10-20 MED ORDER — FAMOTIDINE IN NACL 20-0.9 MG/50ML-% IV SOLN: 20.0000 mg | Freq: Two times a day (BID) | INTRAVENOUS | Status: DC

## 2017-10-20 MED ORDER — SODIUM CHLORIDE 0.9 % IV BOLUS
1000.0000 mL | Freq: Once | INTRAVENOUS | Status: AC
  Administered 2017-10-20: 1000 mL via INTRAVENOUS

## 2017-10-20 MED ORDER — LIDOCAINE HCL URETHRAL/MUCOSAL 2 % EX GEL
CUTANEOUS | Status: AC
  Filled 2017-10-20: qty 30

## 2017-10-20 MED ORDER — HYDROMORPHONE HCL 1 MG/ML IJ SOLN
0.5000 mg | Freq: Once | INTRAMUSCULAR | Status: AC
  Administered 2017-10-20: 0.5 mg via INTRAVENOUS
  Filled 2017-10-20: qty 1

## 2017-10-20 MED ORDER — ENOXAPARIN SODIUM 40 MG/0.4ML ~~LOC~~ SOLN
40.0000 mg | SUBCUTANEOUS | Status: DC
  Administered 2017-10-20 – 2017-10-21 (×2): 40 mg via SUBCUTANEOUS
  Filled 2017-10-20 (×2): qty 0.4

## 2017-10-20 MED ORDER — FAMOTIDINE IN NACL 20-0.9 MG/50ML-% IV SOLN
20.0000 mg | INTRAVENOUS | Status: DC
  Administered 2017-10-20 – 2017-10-21 (×2): 20 mg via INTRAVENOUS
  Filled 2017-10-20 (×2): qty 50

## 2017-10-20 MED ORDER — MORPHINE SULFATE (PF) 2 MG/ML IV SOLN
2.0000 mg | INTRAVENOUS | Status: DC | PRN
  Administered 2017-10-20 – 2017-10-21 (×2): 2 mg via INTRAVENOUS
  Filled 2017-10-20 (×2): qty 1

## 2017-10-20 NOTE — ED Notes (Signed)
Bed: WA05 Expected date:  Expected time:  Means of arrival:  Comments: 

## 2017-10-20 NOTE — ED Notes (Signed)
Pt is alert and oriented x 4 and is  Verbally responsive. Pt denies pain at this time. Pt spouse is at bedside.

## 2017-10-20 NOTE — ED Notes (Signed)
Bed: WA04 Expected date:  Expected time:  Means of arrival:  Comments: 

## 2017-10-20 NOTE — Progress Notes (Signed)
CC: SBO  Subjective: Patient returns to ED after leaving AMA last night. See full H&P from Dr. Marcello Moores yesterday.  Patient reports initially he felt like abdominal pain and distention were better when he left last night. Was feeling ok until shortly before he came back to ED with worsened abdominal pain, distention, nausea and vomiting. Patient has thrown up several times, bilious emesis. Denies flatus or BM. Last BM was yesterday. Hx of R hemicolectomy in 2009, had been doing well since then.   ASA 325 daily listed in home medication but patient is no longer taking this. No other blood thinning medications.  Objective: Vital signs in last 24 hours: Temp:  [98.1 F (36.7 C)-98.4 F (36.9 C)] 98.4 F (36.9 C) (07/11 0915) Pulse Rate:  [55-64] 58 (07/11 1128) Resp:  [14-19] 14 (07/11 1128) BP: (116-170)/(78-95) 170/95 (07/11 1128) SpO2:  [97 %-100 %] 98 % (07/11 1128) Weight:  [90.7 kg (200 lb)] 90.7 kg (200 lb) (07/11 1014)    Intake/Output from previous day: No intake/output data recorded. Intake/Output this shift: Total I/O In: 1000 [IV Piggyback:1000] Out: -   General appearance: alert, cooperative and no distress Eyes: sclera non-icteric, EOMI, PERRL Ears: external ears normal bilaterally  Nose: nares patent Throat: lips, mucosa, and tongue normal; teeth and gums normal Neck: no adenopathy, no carotid bruit, no JVD and supple, symmetrical, trachea midline Resp: clear to auscultation bilaterally Cardio: regular rate and rhythm GI: tender (mid-epigastric), distention, no rebound or guarding Skin: Skin color, texture, turgor normal. No rashes or lesions  Lab Results:  Recent Labs    10/19/17 1955 10/20/17 1014  WBC 12.6* 12.0*  HGB 12.5* 12.5*  HCT 38.5* 38.8*  PLT 274 300    BMET Recent Labs    10/19/17 1955 10/20/17 1014  NA 141 141  K 4.0 3.8  CL 108 106  CO2 23 26  GLUCOSE 135* 125*  BUN 19 23  CREATININE 1.86* 2.11*  CALCIUM 9.6 9.9    PT/INR No results for input(s): LABPROT, INR in the last 72 hours.  Recent Labs  Lab 10/19/17 1955 10/20/17 1014  AST 27 24  ALT 21 19  ALKPHOS 76 74  BILITOT 0.8 0.6  PROT 7.8 8.0  ALBUMIN 4.3 4.4     Lipase     Component Value Date/Time   LIPASE 35 10/20/2017 1014   Prior to Admission medications   Medication Sig Start Date End Date Taking? Authorizing Provider  atorvastatin (LIPITOR) 20 MG tablet Take 20 mg by mouth daily.   Yes [provider]  buPROPion (WELLBUTRIN XL) 150 MG 24 hr tablet Take 450 mg by mouth every morning.    Yes [provider]  felodipine (PLENDIL) 10 MG 24 hr tablet Take 1 tablet by mouth daily. 11/21/15  Yes [provider]  gabapentin (NEURONTIN) 800 MG tablet Take 800 mg by mouth 3 (three) times daily.   Yes [provider]  losartan (COZAAR) 100 MG tablet Take 100 mg by mouth daily.   Yes [provider]  metoprolol succinate (TOPROL-XL) 100 MG 24 hr tablet Take 100 mg by mouth daily. Take with or immediately following a meal.   Yes [provider]  metoprolol succinate (TOPROL-XL) 50 MG 24 hr tablet Take 50 mg by mouth daily. Take with or immediately following a meal.   Yes [provider]  potassium chloride SA (K-DUR,KLOR-CON) 20 MEQ tablet Take 40 mEq by mouth daily.  09/01/14  Yes [provider]  spironolactone (ALDACTONE) 25 MG tablet Take 25 mg by mouth daily.   Yes [provider]  Vitamin D, Ergocalciferol, (DRISDOL) 50000 units CAPS capsule Take 50,000 Units by mouth every 7 (seven) days.   Yes [provider]  acetaminophen (TYLENOL) 325 MG tablet Take 2 tablets (650 mg total) by mouth every 6 (six) hours as needed for mild pain (or Fever >/= 101). Patient not taking: Reported on 10/19/2017 06/01/16   Matthew Saras, PA-C  aspirin EC 325 MG EC tablet 1 tab a day for the next 30 days to prevent blood clots Patient not taking: Reported on 10/19/2017  06/01/16   Shepperson, Kirstin, PA-C  docusate sodium (COLACE) 100 MG capsule 1 tab 2 times a day while on narcotics.  STOOL SOFTENER Patient not taking: Reported on 10/19/2017 06/01/16   Shepperson, Kirstin, PA-C  ondansetron (ZOFRAN) 4 MG tablet Take 1 tablet (4 mg total) by mouth every 6 (six) hours as needed for nausea. Patient not taking: Reported on 10/19/2017 06/02/16   Matthew Saras, PA-C  oxyCODONE (ROXICODONE) 15 MG immediate release tablet 1 tablet every 3-4 hrs as needed for pain Patient not taking: Reported on 10/19/2017 06/01/16   Shepperson, Kirstin, PA-C  polyethylene glycol (MIRALAX / GLYCOLAX) packet 17grams in 6 oz of water twice a day until bowel movement.  LAXITIVE.  Restart if two days since last bowel movement Patient not taking: Reported on 10/19/2017 06/01/16   Matthew Saras, PA-C      Medications:   Assessment/Plan Hx of colon cancer with right colon resection 02/2008 Dr Ronnald Collum Prostate cancer - radiation - therapy/TURP Hypertension Osteoarthritis with knee replacements Hx of depression  SBO - patient seen and admitted last night but left AMA from ED - returns today with the same symptoms - f/u KUB with persistent sbo - agrees to NGT placed in radiology, will allow time to decompress after placement - start small bowel protocol tomorrow AM  FEN: NPO, IVF; NGT to [facility]WS VTE: SCDs, lovenox ID: no abx indicated  LOS: 0 days   Brigid Re , [facility] 10/20/2017, 1:58 PM Pager: [phone] Consults: [phone] Mon-Fri 7:00 am-4:30 pm Sat-Sun 7:00 am-11:30 am

## 2017-10-20 NOTE — ED Provider Notes (Signed)
[place] DEPT Provider Note   CSN: [id number] Arrival date & time: 10/20/17  0906     History   Chief Complaint Chief Complaint  Patient presents with  . Abdominal Pain    SBO    HPI George [name]. is a 72 y.o. male.  Patient complains of abdominal pain.  He also has nausea and vomiting.  Patient was seen in the emergency department yesterday and had a CT scan that showed small bowel obstruction.  He left AMA.  The history is provided by the patient. No language interpreter was used.  Abdominal Pain   This is a new problem. The current episode started 2 days ago. The problem occurs constantly. The problem has not changed since onset.The pain is associated with an unknown factor. The pain is located in the epigastric region. The quality of the pain is aching. Associated symptoms include anorexia and vomiting. Pertinent negatives include diarrhea, frequency, hematuria and headaches. Nothing aggravates the symptoms.    Past Medical History:  Diagnosis Date  . Anxiety   . Arthritis    OSTEO IN KNEE  . Asthma    as child  . Colon cancer ([place])   . Depression   . Elevated prostate specific antigen (PSA)   . Generalized anxiety disorder 04/09/2015  . Hematospermia   . Hx antineoplastic chemotherapy 2010  . Hypertension   . 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 Incomplete bladder emptying   . Malignant neoplasm of prostate ([place])   . Nodular prostate with lower urinary tract symptoms   . Panic attacks 04/09/2015  . Primary localized osteoarthrosis of the knee, right 05/19/2016  . Prostatitis   . S/P total knee arthroplasty, left 05/19/2016  . Spermatocele   . Weak urinary stream     Patient Active Problem List   Diagnosis Date Noted  . Primary localized osteoarthritis of right knee 05/31/2016  . Primary localized osteoarthrosis of the knee, right 05/19/2016  . S/P total knee arthroplasty, left 05/19/2016    . Dyspnea 01/20/2016  . DJD (degenerative joint disease) of knee 04/21/2015  . Primary localized osteoarthritis of left knee 04/09/2015  . Generalized anxiety disorder 04/09/2015  . Panic attacks 04/09/2015  . Spermatocele 05/08/2014  . Benign localized hyperplasia of prostate with urinary obstruction 05/07/2014  . Malignant neoplasm of prostate ([place]) 04/22/2014    Past Surgical History:  Procedure Laterality Date  . COLON RESECTION   NOV 2009  . COLONOSCOPY    . PROSTATE BIOPSY    . SPERMATOCELECTOMY Left 05/07/2014   Procedure: LEFT SPERMATOCELECTOMY;  Surgeon: Malka So, MD;  Location: [facility];  Service: Urology;  Laterality: Left;  . TONSILLECTOMY    . TOTAL KNEE ARTHROPLASTY Left 04/21/2015   Procedure: LEFT TOTAL KNEE ARTHROPLASTY;  Surgeon: Elsie Saas, MD;  Location: [place];  Service: Orthopedics;  Laterality: Left;  . TOTAL KNEE ARTHROPLASTY Right 05/31/2016   Procedure: TOTAL KNEE ARTHROPLASTY;  Surgeon: Elsie Saas, MD;  Location: [place];  Service: Orthopedics;  Laterality: Right;  . TRANSURETHRAL RESECTION OF PROSTATE N/A 05/07/2014   Procedure: TRANSURETHRAL RESECTION OF THE PROSTATE WITH GYRUS INSTRUMENTS;  Surgeon: Malka So, MD;  Location: [facility];  Service: Urology;  Laterality: N/A;        Home Medications    Prior to Admission medications   Medication Sig Start Date End Date Taking? Authorizing Provider  atorvastatin (LIPITOR) 20 MG tablet Take 20 mg by  mouth daily.   Yes [provider]  buPROPion (WELLBUTRIN XL) 150 MG 24 hr tablet Take 450 mg by mouth every morning.    Yes [provider]  felodipine (PLENDIL) 10 MG 24 hr tablet Take 1 tablet by mouth daily. 11/21/15  Yes [provider]  gabapentin (NEURONTIN) 800 MG tablet Take 800 mg by mouth 3 (three) times daily.   Yes [provider]  losartan (COZAAR) 100 MG tablet Take 100 mg by mouth daily.   Yes [provider]  metoprolol succinate (TOPROL-XL) 100 MG  24 hr tablet Take 100 mg by mouth daily. Take with or immediately following a meal.   Yes [provider]  metoprolol succinate (TOPROL-XL) 50 MG 24 hr tablet Take 50 mg by mouth daily. Take with or immediately following a meal.   Yes [provider]  potassium chloride SA (K-DUR,KLOR-CON) 20 MEQ tablet Take 40 mEq by mouth daily.  09/01/14  Yes [provider]  spironolactone (ALDACTONE) 25 MG tablet Take 25 mg by mouth daily.   Yes [provider]  Vitamin D, Ergocalciferol, (DRISDOL) 50000 units CAPS capsule Take 50,000 Units by mouth every 7 (seven) days.   Yes [provider]  acetaminophen (TYLENOL) 325 MG tablet Take 2 tablets (650 mg total) by mouth every 6 (six) hours as needed for mild pain (or Fever >/= 101). Patient not taking: Reported on 10/19/2017 06/01/16   Matthew Saras, PA-C  aspirin EC 325 MG EC tablet 1 tab a day for the next 30 days to prevent blood clots Patient not taking: Reported on 10/19/2017 06/01/16   Shepperson, Kirstin, PA-C  docusate sodium (COLACE) 100 MG capsule 1 tab 2 times a day while on narcotics.  STOOL SOFTENER Patient not taking: Reported on 10/19/2017 06/01/16   Shepperson, Kirstin, PA-C  ondansetron (ZOFRAN) 4 MG tablet Take 1 tablet (4 mg total) by mouth every 6 (six) hours as needed for nausea. Patient not taking: Reported on 10/19/2017 06/02/16   Matthew Saras, PA-C  oxyCODONE (ROXICODONE) 15 MG immediate release tablet 1 tablet every 3-4 hrs as needed for pain Patient not taking: Reported on 10/19/2017 06/01/16   Shepperson, Kirstin, PA-C  polyethylene glycol (MIRALAX / GLYCOLAX) packet 17grams in 6 oz of water twice a day until bowel movement.  LAXITIVE.  Restart if two days since last bowel movement Patient not taking: Reported on 10/19/2017 06/01/16   Matthew Saras, PA-C    Family History Family History  Problem Relation Age of Onset  . Stroke Father   . Cancer Brother        neoplasm of brain      Social History Social History   Tobacco Use  . Smoking status: Former Smoker    Packs/day: 0.25    Years: 3.00    Pack years: 0.75    Types: Cigarettes    Last attempt to quit: 04/12/1972    Years since quitting: 45.5  . Smokeless tobacco: Never Used  . Tobacco comment: smoked 1 cigarette per day SOME DAYS  Substance Use Topics  . Alcohol use: Yes    Alcohol/week: 0.0 oz    Comment: OCCASIONAL  . Drug use: No     Allergies   Amlodipine   Review of Systems Review of Systems  Constitutional: Negative for appetite change and fatigue.  HENT: Negative for congestion, ear discharge and sinus pressure.   Eyes: Negative for discharge.  Respiratory: Negative for cough.   Cardiovascular: Negative for chest pain.  Gastrointestinal:  Positive for abdominal pain, anorexia and vomiting. Negative for diarrhea.  Genitourinary: Negative for frequency and hematuria.  Musculoskeletal: Negative for back pain.  Skin: Negative for rash.  Neurological: Negative for seizures and headaches.  Psychiatric/Behavioral: Negative for hallucinations.     Physical Exam Updated Vital Signs BP (!) 143/87   Pulse (!) 59   Temp 98.4 F (36.9 C) (Oral)   Resp 14   Ht 6' (1.829 m)   Wt 90.7 kg (200 lb)   SpO2 97%   BMI 27.12 kg/m   Physical Exam  Constitutional: He is oriented to person, place, and time. He appears well-developed.  HENT:  Head: Normocephalic.  Eyes: Conjunctivae and EOM are normal. No scleral icterus.  Neck: Neck supple. No thyromegaly present.  Cardiovascular: Normal rate and regular rhythm. Exam reveals no gallop and no friction rub.  No murmur heard. Pulmonary/Chest: No stridor. He has no wheezes. He has no rales. He exhibits no tenderness.  Abdominal: He exhibits distension. There is tenderness. There is no rebound.  Musculoskeletal: Normal range of motion. He exhibits no edema.  Lymphadenopathy:    He has no cervical adenopathy.  Neurological: He is oriented to  person, place, and time. He exhibits normal muscle tone. Coordination normal.  Skin: No rash noted. No erythema.  Psychiatric: He has a normal mood and affect. His behavior is normal.     ED Treatments / Results  Labs (all labs ordered are listed, but only abnormal results are displayed) Labs Reviewed  CBC WITH DIFFERENTIAL/PLATELET - Abnormal; Notable for the following components:      Result Value   WBC 12.0 (*)    Hemoglobin 12.5 (*)    HCT 38.8 (*)    Neutro Abs 9.8 (*)    All other components within normal limits  COMPREHENSIVE METABOLIC PANEL - Abnormal; Notable for the following components:   Glucose, Bld 125 (*)    Creatinine, Ser 2.11 (*)    GFR calc non Af Amer 29 (*)    GFR calc Af Amer 34 (*)    All other components within normal limits  LIPASE, BLOOD    EKG None  Radiology Ct Abdomen Pelvis W Contrast  Result Date: 10/19/2017 CLINICAL DATA:  Generalized acute abdominal pain EXAM: CT ABDOMEN AND PELVIS WITH CONTRAST TECHNIQUE: Multidetector CT imaging of the abdomen and pelvis was performed using the standard protocol following bolus administration of intravenous contrast. CONTRAST:  47mL ISOVUE-300 IOPAMIDOL (ISOVUE-300) INJECTION 61% COMPARISON:  09/15/2009 FINDINGS: Lower chest:  No acute finding Hepatobiliary: Numerous low densities within the liver that are chronic based on remote imaging.No evidence of biliary obstruction or stone. Pancreas: Generalized atrophy.  No acute finding Spleen: Subtle subcentimeter low densities, chronic and benign. Adrenals/Urinary Tract: Negative adrenals. No hydronephrosis or stone. Unremarkable bladder. Stomach/Bowel: Dilated and fluid-filled small bowel with transition point at a kink anterior to the transverse colon, presumably adhesion. The obstruction is high-grade with decompressed bowel distally. No evidence of bowel ischemia. Right hemicolectomy. Vascular/Lymphatic: No acute finding. Minimal atheromatous change for age. No mass  or adenopathy. Reproductive:Fiducial markers of the prostate. Other: No ascites or pneumoperitoneum. Musculoskeletal: No acute abnormalities. IMPRESSION: 1. High-grade small bowel obstruction with transition point in the ventral abdomen where there is kinked loop suggesting adhesive disease. 2. Right hemicolectomy. Electronically Signed   By: Monte Fantasia M.D.   On: 10/19/2017 21:11   Dg Abd Acute W/chest  Result Date: 10/20/2017 CLINICAL DATA:  Worsening abdominal pain with nausea and vomiting over the past  several days. Small bowel obstruction. EXAM: DG ABDOMEN ACUTE W/ 1V CHEST COMPARISON:  Abdominal x-ray and CT from yesterday. Chest x-ray dated December 23, 2015. FINDINGS: The cardiomediastinal silhouette is normal in size. Normal pulmonary vascularity. No focal consolidation, pleural effusion, or pneumothorax. The enteric tube has been removed. Persistent dilated loops of small bowel with air-fluid levels in the mid abdomen. Small amount of contrast within the bladder. No acute osseous abnormality. IMPRESSION: 1. Unchanged small bowel obstruction. 2.  No active cardiopulmonary disease. Electronically Signed   By: Titus Dubin M.D.   On: 10/20/2017 10:14   Dg Abd Portable 1 View  Addendum Date: 10/19/2017   ADDENDUM REPORT: 10/19/2017 23:24 ADDENDUM: Acute findings discussed with and reconfirmed by Dr.JULIE HAVILAND on 10/19/2017 at 11:20 pm. Electronically Signed   By: Elon Alas M.D.   On: 10/19/2017 23:24   Result Date: 10/19/2017 CLINICAL DATA:  Nasogastric tube position. EXAM: PORTABLE ABDOMEN - 1 VIEW COMPARISON:  CT abdomen and pelvis October 19, 2017 FINDINGS: Nasogastric tube looped in region of distal esophagus, tip out of field of view. Gas distended small bowel measuring to 4 cm. Gas within nondistended large bowel. No intra-abdominal mass effect. Soft tissue planes and included osseous structures are unchanged. IMPRESSION: Nasogastric tube looped in region of distal esophagus.  Recommend redirection. Electronically Signed: By: Elon Alas M.D. On: 10/19/2017 23:16    Procedures Procedures (including critical care time)  Medications Ordered in ED Medications  sodium chloride 0.9 % bolus 1,000 mL (0 mLs Intravenous Stopped 10/20/17 1216)  ondansetron (ZOFRAN) injection 4 mg (4 mg Intravenous Given 10/20/17 1016)  HYDROmorphone (DILAUDID) injection 0.5 mg (0.5 mg Intravenous Given 10/20/17 1228)  ondansetron (ZOFRAN) injection 4 mg (4 mg Intravenous Given 10/20/17 1227)     Initial Impression / Assessment and Plan / ED Course  I have reviewed the triage vital signs and the nursing notes.  Pertinent labs & imaging results that were available during my care of the patient were reviewed by me and considered in my medical decision making (see chart for details).    Abdominal series shows the patient still has the small bowel obstruction.  He will be admitted by general surgery Final Clinical Impressions(s) / ED Diagnoses   Final diagnoses:  SBO (small bowel obstruction) [facility])    ED Discharge Orders    None       Milton Ferguson, MD 10/20/17 1340

## 2017-10-20 NOTE — ED Notes (Signed)
ED TO INPATIENT HANDOFF REPORT  Name/Age/Gender George [name]. 72 y.o. male  Code Status    Code Status Orders  (From admission, onward)        Start     Ordered   10/20/17 1337  Full code  Continuous     10/20/17 1340    Code Status History    Date Active Date Inactive Code Status Order ID Comments User Context   05/31/2016 1615 06/02/2016 1413 Full Code [id number]  Nilda Riggs Inpatient   04/21/2015 1717 04/22/2015 2045 Full Code [id number]  Nilda Riggs Inpatient   05/07/2014 1226 05/08/2014 1522 Full Code [id number]  Malka So, MD Inpatient      Home/SNF/Other Home  Chief Complaint abd pain   Level of Care/Admitting Diagnosis ED Disposition    [facility] Dis[facility]: [facility] [[id number]]  Level of Care: Med-Surg [16]  Diagnosis: SBO (small bowel obstruction) [facility]) [[id number]]  Admitting Physician: Kempner, Mio  Attending Physician: CCS, MD [[id number]]  Estimated length of stay: past midnight tomorrow  Certification:: I certify this patient will need inpatient services for at least 2 midnights  Bed request comments: 5W  PT Class (Do Not Modify): Inpatient [101]  PT Acc Code (Do Not Modify): Private [1]       Medical History Past Medical History:  Diagnosis Date  . Anxiety   . Arthritis    OSTEO IN KNEE  . Asthma    as child  . Colon cancer ([place])   . Depression   . Elevated prostate specific antigen (PSA)   . Generalized anxiety disorder 04/09/2015  . Hematospermia   . Hx antineoplastic chemotherapy 2010  . Hypertension   . 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 Incomplete bladder emptying   . Malignant neoplasm of prostate ([place])   . Nodular prostate with lower urinary tract symptoms   . Panic attacks 04/09/2015  . Primary localized osteoarthrosis of the knee, right 05/19/2016  . Prostatitis   . S/P total knee arthroplasty, left 05/19/2016   . Spermatocele   . Weak urinary stream     Allergies Allergies  Allergen Reactions  . Amlodipine Other (See Comments)    Dizziness     IV Location/Drains/Wounds Patient Lines/Drains/Airways Status   Active Line/Drains/Airways    Name:   Placement date:   Placement time:   Site:   Days:   Peripheral IV 10/19/17 Left Forearm   10/19/17    1944    Forearm   1   Peripheral IV 10/20/17 Left Antecubital   10/20/17    1017    Antecubital   less than 1   Incision (Closed) 05/07/14 Scrotum Left   05/07/14    1009     1262   Incision (Closed) 04/21/15 Leg Left   04/21/15    1135     913   Incision (Closed) 05/31/16 Leg Right   05/31/16    1217     507          Labs/Imaging Results for orders placed or performed during the hospital encounter of 10/20/17 (from the past 48 hour(s))  CBC with Differential/Platelet     Status: Abnormal   Collection Time: 10/20/17 10:14 AM  Result Value Ref Range   WBC 12.0 (H) 4.0 - 10.5 K/uL   RBC 4.39 4.22 - 5.81 MIL/uL   Hemoglobin 12.5 (L) 13.0 - 17.0  g/dL   HCT 38.8 (L) 39.0 - 52.0 %   MCV 88.4 78.0 - 100.0 fL   MCH 28.5 26.0 - 34.0 pg   MCHC 32.2 30.0 - 36.0 g/dL   RDW 13.7 11.5 - 15.5 %   Platelets 300 150 - 400 K/uL   Neutrophils Relative % 81 %   Neutro Abs 9.8 (H) 1.7 - 7.7 K/uL   Lymphocytes Relative 12 %   Lymphs Abs 1.4 0.7 - 4.0 K/uL   Monocytes Relative 6 %   Monocytes Absolute 0.7 0.1 - 1.0 K/uL   Eosinophils Relative 1 %   Eosinophils Absolute 0.1 0.0 - 0.7 K/uL   Basophils Relative 0 %   Basophils Absolute 0.0 0.0 - 0.1 K/uL    Comment: Performed at [facility], [place] [address] [place], [place] [address]  Comprehensive metabolic panel     Status: Abnormal   Collection Time: 10/20/17 10:14 AM  Result Value Ref Range   Sodium 141 135 - 145 mmol/L   Potassium 3.8 3.5 - 5.1 mmol/L   Chloride 106 98 - 111 mmol/L    Comment: Please note change in reference range.   CO2 26 22 - 32 mmol/L   Glucose, Bld 125  (H) 70 - 99 mg/dL    Comment: Please note change in reference range.   BUN 23 8 - 23 mg/dL    Comment: Please note change in reference range.   Creatinine, Ser 2.11 (H) 0.61 - 1.24 mg/dL   Calcium 9.9 8.9 - 10.3 mg/dL   Total Protein 8.0 6.5 - 8.1 g/dL   Albumin 4.4 3.5 - 5.0 g/dL   AST 24 15 - 41 U/L   ALT 19 0 - 44 U/L    Comment: Please note change in reference range.   Alkaline Phosphatase 74 38 - 126 U/L   Total Bilirubin 0.6 0.3 - 1.2 mg/dL   GFR calc non Af Amer 29 (L) >60 mL/min   GFR calc Af Amer 34 (L) >60 mL/min    Comment: (NOTE) The eGFR has been calculated using the CKD EPI equation. This calculation has not been validated in all clinical situations. eGFR's persistently <60 mL/min signify possible Chronic Kidney Disease.    Anion gap 9 5 - 15    Comment: Performed at [facility], [place] [address] [place], [place] [address]  Lipase, blood     Status: None   Collection Time: 10/20/17 10:14 AM  Result Value Ref Range   Lipase 35 11 - 51 U/L    Comment: Performed at [facility], [place] [address], [place], [place] [address]   Ct Abdomen Pelvis W Contrast  Result Date: 10/19/2017 CLINICAL DATA:  Generalized acute abdominal pain EXAM: CT ABDOMEN AND PELVIS WITH CONTRAST TECHNIQUE: Multidetector CT imaging of the abdomen and pelvis was performed using the standard protocol following bolus administration of intravenous contrast. CONTRAST:  45m ISOVUE-300 IOPAMIDOL (ISOVUE-300) INJECTION 61% COMPARISON:  09/15/2009 FINDINGS: Lower chest:  No acute finding Hepatobiliary: Numerous low densities within the liver that are chronic based on remote imaging.No evidence of biliary obstruction or stone. Pancreas: Generalized atrophy.  No acute finding Spleen: Subtle subcentimeter low densities, chronic and benign. Adrenals/Urinary Tract: Negative adrenals. No hydronephrosis or stone. Unremarkable bladder. Stomach/Bowel: Dilated and fluid-filled small  bowel with transition point at a kink anterior to the transverse colon, presumably adhesion. The obstruction is high-grade with decompressed bowel distally. No evidence of bowel ischemia. Right hemicolectomy. Vascular/Lymphatic: No acute  finding. Minimal atheromatous change for age. No mass or adenopathy. Reproductive:Fiducial markers of the prostate. Other: No ascites or pneumoperitoneum. Musculoskeletal: No acute abnormalities. IMPRESSION: 1. High-grade small bowel obstruction with transition point in the ventral abdomen where there is kinked loop suggesting adhesive disease. 2. Right hemicolectomy. Electronically Signed   By: Monte Fantasia M.D.   On: 10/19/2017 21:11   Dg Abd Acute W/chest  Result Date: 10/20/2017 CLINICAL DATA:  Worsening abdominal pain with nausea and vomiting over the past several days. Small bowel obstruction. EXAM: DG ABDOMEN ACUTE W/ 1V CHEST COMPARISON:  Abdominal x-ray and CT from yesterday. Chest x-ray dated December 23, 2015. FINDINGS: The cardiomediastinal silhouette is normal in size. Normal pulmonary vascularity. No focal consolidation, pleural effusion, or pneumothorax. The enteric tube has been removed. Persistent dilated loops of small bowel with air-fluid levels in the mid abdomen. Small amount of contrast within the bladder. No acute osseous abnormality. IMPRESSION: 1. Unchanged small bowel obstruction. 2.  No active cardiopulmonary disease. Electronically Signed   By: Titus Dubin M.D.   On: 10/20/2017 10:14   Dg Abd Portable 1 View  Addendum Date: 10/19/2017   ADDENDUM REPORT: 10/19/2017 23:24 ADDENDUM: Acute findings discussed with and reconfirmed by Dr.JULIE HAVILAND on 10/19/2017 at 11:20 pm. Electronically Signed   By: Elon Alas M.D.   On: 10/19/2017 23:24   Result Date: 10/19/2017 CLINICAL DATA:  Nasogastric tube position. EXAM: PORTABLE ABDOMEN - 1 VIEW COMPARISON:  CT abdomen and pelvis October 19, 2017 FINDINGS: Nasogastric tube looped in region of  distal esophagus, tip out of field of view. Gas distended small bowel measuring to 4 cm. Gas within nondistended large bowel. No intra-abdominal mass effect. Soft tissue planes and included osseous structures are unchanged. IMPRESSION: Nasogastric tube looped in region of distal esophagus. Recommend redirection. Electronically Signed: By: Elon Alas M.D. On: 10/19/2017 23:16    Pending Labs Unresulted Labs (From admission, onward)   Start     Ordered   10/27/17 0500  Creatinine, serum  (enoxaparin (LOVENOX)    CrCl >/= 30 ml/min)  Weekly,   R    Comments:  while on enoxaparin therapy    10/20/17 1340   10/21/17 1941  Basic metabolic panel  Tomorrow morning,   R     10/20/17 1340   10/21/17 0500  CBC  Tomorrow morning,   R     10/20/17 1340   10/20/17 1337  CBC  (enoxaparin (LOVENOX)    CrCl >/= 30 ml/min)  Once,   R    Comments:  Baseline for enoxaparin therapy IF NOT ALREADY DRAWN.  Notify MD if PLT < 100 K.    10/20/17 1340   10/20/17 1337  Creatinine, serum  (enoxaparin (LOVENOX)    CrCl >/= 30 ml/min)  Once,   R    Comments:  Baseline for enoxaparin therapy IF NOT ALREADY DRAWN.    10/20/17 1340      Vitals/Pain Today's Vitals   10/20/17 1128 10/20/17 1245 10/20/17 1415 10/20/17 1423  BP: (!) 170/95 (!) 143/87 138/78 131/79  Pulse: (!) 58 (!) 59 62 63  Resp: 14   15  Temp:      TempSrc:      SpO2: 98% 97% 93% 95%  Weight:      Height:      PainSc:        Isolation Precautions No active isolations  Medications Medications  enoxaparin (LOVENOX) injection 40 mg (has no administration in time range)  dextrose  5 % and 0.45 % NaCl with KCl 20 mEq/L infusion (has no administration in time range)  morphine 2 MG/ML injection 2-4 mg (has no administration in time range)  ondansetron (ZOFRAN-ODT) disintegrating tablet 4 mg (has no administration in time range)    Or  ondansetron (ZOFRAN) injection 4 mg (has no administration in time range)  metoprolol tartrate  (LOPRESSOR) injection 5 mg (has no administration in time range)  diatrizoate meglumine-sodium (GASTROGRAFIN) 66-10 % solution 90 mL (has no administration in time range)  famotidine (PEPCID) IVPB 20 mg premix (has no administration in time range)  lidocaine (XYLOCAINE) 2 % jelly (has no administration in time range)  iopamidol (ISOVUE-300) 61 % injection (has no administration in time range)  sodium chloride 0.9 % bolus 1,000 mL (0 mLs Intravenous Stopped 10/20/17 1216)  ondansetron (ZOFRAN) injection 4 mg (4 mg Intravenous Given 10/20/17 1016)  HYDROmorphone (DILAUDID) injection 0.5 mg (0.5 mg Intravenous Given 10/20/17 1228)  ondansetron (ZOFRAN) injection 4 mg (4 mg Intravenous Given 10/20/17 1227)    Mobility walks

## 2017-10-20 NOTE — ED Notes (Signed)
Pt transported to XRAY °

## 2017-10-20 NOTE — ED Triage Notes (Signed)
Pt left last night and told to return related to SBO. Complaint of continued abdominal pain with n/v/d.

## 2017-10-21 ENCOUNTER — Inpatient Hospital Stay (HOSPITAL_COMMUNITY): Payer: Medicare Other

## 2017-10-21 LAB — BASIC METABOLIC PANEL
Anion gap: 8 (ref 5–15)
BUN: 18 mg/dL (ref 8–23)
CHLORIDE: 109 mmol/L (ref 98–111)
CO2: 24 mmol/L (ref 22–32)
CREATININE: 1.64 mg/dL — AB (ref 0.61–1.24)
Calcium: 8.8 mg/dL — ABNORMAL LOW (ref 8.9–10.3)
GFR calc non Af Amer: 40 mL/min — ABNORMAL LOW (ref >60)
GFR, EST AFRICAN AMERICAN: 46 mL/min — AB (ref >60)
Glucose, Bld: 111 mg/dL — ABNORMAL HIGH (ref 70–99)
POTASSIUM: 3.7 mmol/L (ref 3.5–5.1)
Sodium: 141 mmol/L (ref 135–145)

## 2017-10-21 LAB — CBC
HEMATOCRIT: 35.5 % — AB (ref 39.0–52.0)
Hemoglobin: 11.3 g/dL — ABNORMAL LOW (ref 13.0–17.0)
MCH: 28.3 pg (ref 26.0–34.0)
MCHC: 31.8 g/dL (ref 30.0–36.0)
MCV: 88.8 fL (ref 78.0–100.0)
Platelets: 265 10*3/uL (ref 150–400)
RBC: 4 MIL/uL — AB (ref 4.22–5.81)
RDW: 13.8 % (ref 11.5–15.5)
WBC: 9.5 10*3/uL (ref 4.0–10.5)

## 2017-10-21 MED ORDER — MENTHOL 3 MG MT LOZG: 1.0000 | LOZENGE | OROMUCOSAL | Status: DC | PRN

## 2017-10-21 MED ORDER — METOPROLOL SUCCINATE ER 50 MG PO TB24: 50.0000 mg | ORAL_TABLET | Freq: Every day | ORAL | Status: DC

## 2017-10-21 MED ORDER — METOPROLOL SUCCINATE ER 50 MG PO TB24
150.0000 mg | ORAL_TABLET | Freq: Every day | ORAL | Status: DC
  Administered 2017-10-21 – 2017-10-22 (×2): 150 mg via ORAL
  Filled 2017-10-21 (×2): qty 3

## 2017-10-21 MED ORDER — BUPROPION HCL ER (XL) 150 MG PO TB24
450.0000 mg | ORAL_TABLET | Freq: Every day | ORAL | Status: DC
  Administered 2017-10-21 – 2017-10-22 (×2): 450 mg via ORAL
  Filled 2017-10-21 (×2): qty 3

## 2017-10-21 MED ORDER — FELODIPINE ER 10 MG PO TB24
10.0000 mg | ORAL_TABLET | Freq: Every day | ORAL | Status: DC
  Administered 2017-10-21 – 2017-10-22 (×2): 10 mg via ORAL
  Filled 2017-10-21 (×2): qty 1

## 2017-10-21 MED ORDER — SPIRONOLACTONE 25 MG PO TABS
25.0000 mg | ORAL_TABLET | Freq: Every day | ORAL | Status: DC
  Administered 2017-10-21 – 2017-10-22 (×2): 25 mg via ORAL
  Filled 2017-10-21 (×2): qty 1

## 2017-10-21 MED ORDER — GABAPENTIN 400 MG PO CAPS
800.0000 mg | ORAL_CAPSULE | Freq: Three times a day (TID) | ORAL | Status: DC
  Administered 2017-10-21 – 2017-10-22 (×2): 800 mg via ORAL
  Filled 2017-10-21 (×2): qty 2

## 2017-10-21 MED ORDER — POLYETHYLENE GLYCOL 3350 17 G PO PACK: 17.0000 g | PACK | Freq: Every day | ORAL | Status: DC | PRN

## 2017-10-21 MED ORDER — DOCUSATE SODIUM 100 MG PO CAPS
100.0000 mg | ORAL_CAPSULE | Freq: Two times a day (BID) | ORAL | Status: DC
  Administered 2017-10-21 – 2017-10-22 (×2): 100 mg via ORAL
  Filled 2017-10-21 (×2): qty 1

## 2017-10-21 NOTE — Progress Notes (Signed)
    CC:  SBO  Subjective: Patient feels better, his stomach is less distended.  He is passing gas and has had a bowel movement this morning.  He got the Gastrografin this a.m. at 6 AM.  He should have a film shortly, and we can decide at that point what to do with his NG.  Objective: Vital signs in last 24 hours: Temp:  [98.4 F (36.9 C)-99.3 F (37.4 C)] 98.9 F (37.2 C) (07/12 0510) Pulse Rate:  [58-63] 60 (07/12 0510) Resp:  [14-18] 18 (07/12 0510) BP: (131-170)/(78-98) 161/97 (07/12 0510) SpO2:  [93 %-98 %] 94 % (07/12 0510)   250 from NG recorded 2136 IV 700 urine No BM listed. Afebrile, BP is up Creatinine is better WBC is better NG placed under fluoroscopy yesterday Small bowel protocol started at 6 AM film do this afternoon.  Intake/Output from previous day: 07/11 0701 - 07/12 0700 In: 2136.7 [I.V.:1086.7; IV Piggyback:1050] Out: 950 [Urine:700; Emesis/NG output:250] Intake/Output this shift: No intake/output data recorded.  General appearance: alert, cooperative and no distress Resp: clear to auscultation bilaterally GI: Soft, not really distended he is much more comfortable.  He reports flatus and a BM this a.m.  Lab Results:  Recent Labs    10/20/17 1014 10/21/17 0432  WBC 12.0* 9.5  HGB 12.5* 11.3*  HCT 38.8* 35.5*  PLT 300 265    BMET Recent Labs    10/20/17 1014 10/21/17 0432  NA 141 141  K 3.8 3.7  CL 106 109  CO2 26 24  GLUCOSE 125* 111*  BUN 23 18  CREATININE 2.11* 1.64*  CALCIUM 9.9 8.8*   PT/INR No results for input(s): LABPROT, INR in the last 72 hours.  Recent Labs  Lab 10/19/17 1955 10/20/17 1014  AST 27 24  ALT 21 19  ALKPHOS 76 74  BILITOT 0.8 0.6  PROT 7.8 8.0  ALBUMIN 4.3 4.4     Lipase     Component Value Date/Time   LIPASE 35 10/20/2017 1014     Medications: . enoxaparin (LOVENOX) injection  40 mg Subcutaneous Q24H  . mouth rinse  15 mL Mouth Rinse BID    Assessment/Plan Hx of colon cancer with  right colon resection 02/2008 Dr Ronnald Collum Prostate cancer - radiation - therapy/TURP Hypertension Acute on chronic kidney disease - creatinine is better Osteoarthritis with knee replacements Hx of depression  SBO - patient seen and admitted last night but left AMA from ED - returns today with the same symptoms - f/u KUB with persistent sbo - agrees to NGT placed in radiology, will allow time to decompress after placement - start small bowel protocol - results pending - +flatus and BM this AM 7/12    FEN: NPO, IVF; NGT to LIWS VTE: SCDs, lovenox ID: no abx indicated   Plan: Small bowel protocol is in progress he should have a film around noon.  He has had a bowel movement and is passing flatus.  NG was placed last evening but under fluoroscopy.  We will make a decision about the NG tube after the film.       LOS: 1 day    George [name] 10/21/2017 [phone]

## 2017-10-21 NOTE — Progress Notes (Signed)
Pt inquiry about home meds resuming, MD was called and orders were given.

## 2017-10-22 NOTE — Discharge Instructions (Signed)
Small Bowel Obstruction A small bowel obstruction is a blockage in the small bowel. The small bowel, which is also called the small intestine, is a Pelley, slender tube that connects the stomach to the colon. When a person eats and drinks, food and fluids go from the stomach to the small bowel. This is where most of the nutrients in the food and fluids are absorbed. A small bowel obstruction will prevent food and fluids from passing through the small bowel as they normally do during digestion. The small bowel can become partially or completely blocked. This can cause symptoms such as abdominal pain, vomiting, and bloating. If this condition is not treated, it can be dangerous because the small bowel could rupture. What are the causes? Common causes of this condition include:  Scar tissue from previous surgery or radiation treatment.  Recent surgery. This may cause the movements of the bowel to slow down and cause food to block the intestine.  Hernias.  Inflammatory bowel disease (colitis).  Twisting of the bowel (volvulus).  Tumors.  A foreign body.  Slipping of a part of the bowel into another part (intussusception).  What are the signs or symptoms? Symptoms of this condition include:  Abdominal pain. This may be dull cramps or sharp pain. It may occur in one area, or it may be present in the entire abdomen. Pain can range from mild to severe, depending on the degree of obstruction.  Nausea and vomiting. Vomit may be greenish or a yellow bile color.  Abdominal bloating.  Constipation.  Lack of passing gas.  Frequent belching.  Diarrhea. This may occur if the obstruction is partial and runny stool is able to leak around the obstruction.  How is this diagnosed? This condition may be diagnosed based on a physical exam, medical history, and X-rays of the abdomen. You may also have other tests, such as a CT scan of the abdomen and pelvis. How is this treated? Treatment for this  condition depends on the cause and severity of the problem. Treatment options may include:  Bed rest along with fluids and pain medicines that are given through an IV tube inserted into one of your veins. Sometimes, this is all that is needed for the obstruction to improve.  Following a simple diet. In some cases, a clear liquid diet may be required for several days. This allows the bowel to rest.  Placement of a small tube (nasogastric tube) into the stomach. When the bowel is blocked, it usually swells up like a balloon that is filled with air and fluids. The air and fluids may be removed by suction through the nasogastric tube. This can help with pain, discomfort, and nausea. It can also help the obstruction to clear up faster.  Surgery. This may be required if other treatments do not work. Bowel obstruction from a hernia may require early surgery and can be an emergency procedure. Surgery may also be required for scar tissue that causes frequent or severe obstructions.  Follow these instructions at home:  Get plenty of rest.  Follow instructions from your health care provider about eating restrictions. You may need to avoid solid foods and consume only clear liquids until your condition improves.  Take over-the-counter and prescription medicines only as told by your health care provider.  Keep all follow-up visits as told by your health care provider. This is important. Contact a health care provider if:  You have a fever.  You have chills. Get help right away if:  You have increased pain or cramping. °· You vomit blood. °· You have uncontrolled vomiting or nausea. °· You cannot drink fluids because of vomiting or pain. °· You develop confusion. °· You begin feeling very dry or thirsty (dehydrated). °· You have severe bloating. °· You feel extremely weak or you faint. °This information is not intended to replace advice given to you by your health care provider. Make sure you discuss any  questions you have with your health care provider. °Document Released: 06/15/2005 Document Revised: 11/24/2015 Document Reviewed: 05/23/2014 °Elsevier Interactive Patient Education © 2018 [employer] ° °

## 2017-10-22 NOTE — Progress Notes (Signed)
   Subjective/Chief Complaint: Tol fulls, has bowel function, feels normal, wants to go home   Objective: Vital signs in last 24 hours: Temp:  [98.3 F (36.8 C)-99.4 F (37.4 C)] 98.3 F (36.8 C) (07/13 0513) Pulse Rate:  [66-78] 66 (07/13 0954) Resp:  [16-18] 16 (07/13 0513) BP: (124-155)/(76-108) 130/90 (07/13 0954) SpO2:  [97 %-99 %] 97 % (07/13 0513) Last BM Date: 10/22/17  Intake/Output from previous day: 07/12 0701 - 07/13 0700 In: 1120 [P.O.:420; I.V.:700] Out: 950 [Urine:950] Intake/Output this shift: No intake/output data recorded.  Resp: clear to auscultation bilaterally Cardio: regular rate and rhythm GI: soft nd/nt bs prseent  Lab Results:  Recent Labs    10/20/17 1014 10/21/17 0432  WBC 12.0* 9.5  HGB 12.5* 11.3*  HCT 38.8* 35.5*  PLT 300 265   BMET Recent Labs    10/20/17 1014 10/21/17 0432  NA 141 141  K 3.8 3.7  CL 106 109  CO2 26 24  GLUCOSE 125* 111*  BUN 23 18  CREATININE 2.11* 1.64*  CALCIUM 9.9 8.8*   PT/INR No results for input(s): LABPROT, INR in the last 72 hours. ABG No results for input(s): PHART, HCO3 in the last 72 hours.  Invalid input(s): PCO2, PO2  Studies/Results: Dg Abd Portable 1v-small Bowel Obstruction Protocol-initial, 8 Hr Delay  Result Date: 10/21/2017 CLINICAL DATA:  Constipation with nausea and vomiting. EXAM: PORTABLE ABDOMEN - 1 VIEW COMPARISON:  None. FINDINGS: An NG tube terminates in the left upper quadrant, stomach. There is contrast throughout the length of the colon. No evidence of obstruction. No other acute abnormalities identified. IMPRESSION: NG tube is in good position. Contrast is seen throughout the colon. No acute abnormalities. Electronically Signed   By: Dorise Bullion III M.D   On: 10/21/2017 14:28   Dg Loyce Dys Tube Plc W/fl W/rad  Result Date: 10/20/2017 CLINICAL DATA:  Insertion of NG tube under fluoro for decompression. Hx of SBO EXAM: NASO G TUBE PLACEMENT WITH FL AND WITH RAD  FLUOROSCOPY TIME:  Fluoroscopy Time:  1.4 minutes Radiation Exposure Index (if provided by the fluoroscopic device): 23.4 mGy Number of Acquired Spot Images: 1 COMPARISON:  None. FINDINGS: Under fluoroscopic evaluation, a nasogastric tube was advanced into the stomach. The G-tube is adequately positioned in the stomach with proximal side holes distal to the GE junction. IMPRESSION: Nasogastric tube advanced to the stomach under fluoroscopic evaluation. Electronically Signed   By: Franki Cabot M.D.   On: 10/20/2017 15:20    Anti-infectives: Anti-infectives (From admission, onward)   None      Assessment/Plan: SBO  -resolved clinically and radiologically -dc home today on fulls and advance at home discussed with patient -discussed recurrence risk  Rolm Bookbinder 10/22/2017

## 2017-10-22 NOTE — Progress Notes (Signed)
Assessment unchanged. Pt verbalized understanding of dc instructions through teach back. No scripts. Discharged via foot per request and  accompanied by nurse and wife to front entrance.

## 2017-10-26 DIAGNOSIS — N183 Chronic kidney disease, stage 3 (moderate): Secondary | ICD-10-CM | POA: Diagnosis not present

## 2017-10-26 DIAGNOSIS — I129 Hypertensive chronic kidney disease with stage 1 through stage 4 chronic kidney disease, or unspecified chronic kidney disease: Secondary | ICD-10-CM | POA: Diagnosis not present

## 2017-10-26 DIAGNOSIS — D631 Anemia in chronic kidney disease: Secondary | ICD-10-CM | POA: Diagnosis not present

## 2017-10-26 DIAGNOSIS — N2581 Secondary hyperparathyroidism of renal origin: Secondary | ICD-10-CM | POA: Diagnosis not present

## 2017-10-26 NOTE — Discharge Summary (Signed)
Physician Discharge Summary  Patient ID: George [name]. MRN: [id number] DOB/AGE: [birth year] 72 y.o.  Admit date: 10/20/2017 Discharge date: 10/22/2017  Admission Diagnoses: SBO Hx of colon cancer with right colon resection 02/2008 Dr Ronnald Collum Prostate cancer - radiation - therapy/TURP Hypertension Acute on chronic kidney disease - creatinine is better Osteoarthritis with knee replacements Hx of depression    Discharge Diagnoses:  SBO Hx of colon cancer with right colon resection 02/2008 Dr Ronnald Collum Prostate cancer - radiation - therapy/TURP Hypertension Acute on chronic kidney disease - creatinine is better Osteoarthritis with knee replacements Hx of depression   Active Problems:   SBO (small bowel obstruction) (HCC)   PROCEDURES: Small bowel protocol  Hospital Course:  Chief Complaint: N/V HPI: Pt developed progressively worsening RLQ pain today starting at 9 am that was followed by nausea and vomiting (bilious).  Pt has a h/o R colectomy 10 yrs ago for colon cancer.  He also has had prostate cancer treated with radiation.  He was seen in the emergency department and admitted around 10 PM on 2/37/6283, by Dr. Leighton Ruff.  There have been difficulty with the NG tube and he subsequently signed out AMA.  He returned on 10/20/2017 and was admitted.  He agreed to allow radiology to place the NG tube.  He was decompressed over 9 started on the small bowel protocol early the following a.m.  He was feeling much better the following a.m.  His stomach was less distended he was passing gas and had a bowel movement that a.m.  He got Gastrografin at 6 AM.  Portable film obtained and completed at 1420 8 PM showed the NG was in good position contrast was seen throughout the colon with no acute abnormalities.  His NG tube was subsequently removed he was started on clear liquids.  The following a.m. he was advanced to full liquids and did well.  He was seen by Dr. Donne Hazel who reported  that he had resolved clinically and radiologically from a small bowel obstruction.  He was discharged on full liquids and advance at home.  No follow-up was required with our service..  Condition on discharge: Improving.  CBC Latest Ref Rng & Units 10/21/2017 10/20/2017 10/19/2017  WBC 4.0 - 10.5 K/uL 9.5 12.0(H) 12.6(H)  Hemoglobin 13.0 - 17.0 g/dL 11.3(L) 12.5(L) 12.5(L)  Hematocrit 39.0 - 52.0 % 35.5(L) 38.8(L) 38.5(L)  Platelets 150 - 400 K/uL 265 300 274    CMP Latest Ref Rng & Units 10/21/2017 10/20/2017 10/19/2017  Glucose 70 - 99 mg/dL 111(H) 125(H) 135(H)  BUN 8 - 23 mg/dL 18 23 19   Creatinine 0.61 - 1.24 mg/dL 1.64(H) 2.11(H) 1.86(H)  Sodium 135 - 145 mmol/L 141 141 141  Potassium 3.5 - 5.1 mmol/L 3.7 3.8 4.0  Chloride 98 - 111 mmol/L 109 106 108  CO2 22 - 32 mmol/L 24 26 23   Calcium 8.9 - 10.3 mg/dL 8.8(L) 9.9 9.6  Total Protein 6.5 - 8.1 g/dL - 8.0 7.8  Total Bilirubin 0.3 - 1.2 mg/dL - 0.6 0.8  Alkaline Phos 38 - 126 U/L - 74 76  AST 15 - 41 U/L - 24 27  ALT 0 - 44 U/L - 19 21   Disposition:    Allergies as of 10/22/2017      Reactions   Amlodipine Other (See Comments)   Dizziness       Medication List    TAKE these medications   acetaminophen 325 MG tablet Commonly known as:  TYLENOL Take  2 tablets (650 mg total) by mouth every 6 (six) hours as needed for mild pain (or Fever >/= 101).   atorvastatin 20 MG tablet Commonly known as:  LIPITOR Take 20 mg by mouth daily.   buPROPion 150 MG 24 hr tablet Commonly known as:  WELLBUTRIN XL Take 450 mg by mouth every morning.   docusate sodium 100 MG capsule Commonly known as:  COLACE 1 tab 2 times a day while on narcotics.  STOOL SOFTENER   felodipine 10 MG 24 hr tablet Commonly known as:  PLENDIL Take 1 tablet by mouth daily.   gabapentin 800 MG tablet Commonly known as:  NEURONTIN Take 800 mg by mouth 3 (three) times daily.   losartan 100 MG tablet Commonly known as:  COZAAR Take 100 mg by mouth  daily.   metoprolol succinate 100 MG 24 hr tablet Commonly known as:  TOPROL-XL Take 100 mg by mouth daily. Take with or immediately following a meal.   metoprolol succinate 50 MG 24 hr tablet Commonly known as:  TOPROL-XL Take 50 mg by mouth daily. Take with or immediately following a meal.   ondansetron 4 MG tablet Commonly known as:  ZOFRAN Take 1 tablet (4 mg total) by mouth every 6 (six) hours as needed for nausea.   oxyCODONE 15 MG immediate release tablet Commonly known as:  ROXICODONE 1 tablet every 3-4 hrs as needed for pain   polyethylene glycol packet Commonly known as:  MIRALAX / GLYCOLAX 17grams in 6 oz of water twice a day until bowel movement.  LAXITIVE.  Restart if two days since last bowel movement   potassium chloride SA 20 MEQ tablet Commonly known as:  K-DUR,KLOR-CON Take 40 mEq by mouth daily.   spironolactone 25 MG tablet Commonly known as:  ALDACTONE Take 25 mg by mouth daily.   Vitamin D (Ergocalciferol) 50000 units Caps capsule Commonly known as:  DRISDOL Take 50,000 Units by mouth every 7 (seven) days.        SignedEarnstine Regal 10/26/2017, 2:46 PM

## 2017-11-18 DIAGNOSIS — E539 Vitamin B deficiency, unspecified: Secondary | ICD-10-CM | POA: Diagnosis not present

## 2017-11-18 DIAGNOSIS — I10 Essential (primary) hypertension: Secondary | ICD-10-CM | POA: Diagnosis not present

## 2017-11-18 DIAGNOSIS — E785 Hyperlipidemia, unspecified: Secondary | ICD-10-CM | POA: Diagnosis not present

## 2017-11-18 DIAGNOSIS — E559 Vitamin D deficiency, unspecified: Secondary | ICD-10-CM | POA: Diagnosis not present

## 2017-11-18 DIAGNOSIS — R7309 Other abnormal glucose: Secondary | ICD-10-CM | POA: Diagnosis not present

## 2017-11-18 DIAGNOSIS — N189 Chronic kidney disease, unspecified: Secondary | ICD-10-CM | POA: Diagnosis not present

## 2017-11-22 DIAGNOSIS — Z79899 Other long term (current) drug therapy: Secondary | ICD-10-CM | POA: Diagnosis not present

## 2017-11-22 DIAGNOSIS — N189 Chronic kidney disease, unspecified: Secondary | ICD-10-CM | POA: Diagnosis not present

## 2017-11-22 DIAGNOSIS — E559 Vitamin D deficiency, unspecified: Secondary | ICD-10-CM | POA: Diagnosis not present

## 2017-11-22 DIAGNOSIS — I10 Essential (primary) hypertension: Secondary | ICD-10-CM | POA: Diagnosis not present

## 2017-11-22 DIAGNOSIS — R7303 Prediabetes: Secondary | ICD-10-CM | POA: Diagnosis not present

## 2017-11-22 DIAGNOSIS — E785 Hyperlipidemia, unspecified: Secondary | ICD-10-CM | POA: Diagnosis not present

## 2018-01-16 DIAGNOSIS — N183 Chronic kidney disease, stage 3 (moderate): Secondary | ICD-10-CM | POA: Diagnosis not present

## 2018-01-24 DIAGNOSIS — N183 Chronic kidney disease, stage 3 (moderate): Secondary | ICD-10-CM | POA: Diagnosis not present

## 2018-01-24 DIAGNOSIS — I129 Hypertensive chronic kidney disease with stage 1 through stage 4 chronic kidney disease, or unspecified chronic kidney disease: Secondary | ICD-10-CM | POA: Diagnosis not present

## 2018-01-24 DIAGNOSIS — D631 Anemia in chronic kidney disease: Secondary | ICD-10-CM | POA: Diagnosis not present

## 2018-01-24 DIAGNOSIS — N2581 Secondary hyperparathyroidism of renal origin: Secondary | ICD-10-CM | POA: Diagnosis not present

## 2018-01-31 ENCOUNTER — Encounter: Payer: Self-pay | Admitting: Neurology

## 2018-01-31 ENCOUNTER — Encounter

## 2018-01-31 ENCOUNTER — Telehealth: Payer: Self-pay | Admitting: *Deleted

## 2018-01-31 ENCOUNTER — Ambulatory Visit (INDEPENDENT_AMBULATORY_CARE_PROVIDER_SITE_OTHER): Payer: Medicare Other | Admitting: Neurology

## 2018-01-31 VITALS — BP 172/82 | HR 54 | Ht 72.0 in | Wt 205.0 lb

## 2018-01-31 DIAGNOSIS — I6381 Other cerebral infarction due to occlusion or stenosis of small artery: Secondary | ICD-10-CM | POA: Insufficient documentation

## 2018-01-31 DIAGNOSIS — M21371 Foot drop, right foot: Secondary | ICD-10-CM | POA: Diagnosis not present

## 2018-01-31 DIAGNOSIS — I639 Cerebral infarction, unspecified: Secondary | ICD-10-CM | POA: Insufficient documentation

## 2018-01-31 DIAGNOSIS — R7303 Prediabetes: Secondary | ICD-10-CM

## 2018-01-31 DIAGNOSIS — R2689 Other abnormalities of gait and mobility: Secondary | ICD-10-CM

## 2018-01-31 DIAGNOSIS — E538 Deficiency of other specified B group vitamins: Secondary | ICD-10-CM | POA: Diagnosis not present

## 2018-01-31 DIAGNOSIS — G629 Polyneuropathy, unspecified: Secondary | ICD-10-CM | POA: Insufficient documentation

## 2018-01-31 DIAGNOSIS — G620 Drug-induced polyneuropathy: Secondary | ICD-10-CM

## 2018-01-31 DIAGNOSIS — T451X5A Adverse effect of antineoplastic and immunosuppressive drugs, initial encounter: Secondary | ICD-10-CM | POA: Insufficient documentation

## 2018-01-31 MED ORDER — ASPIRIN EC 81 MG PO TBEC: 81.0000 mg | DELAYED_RELEASE_TABLET | Freq: Every day | ORAL | Status: DC

## 2018-01-31 MED ORDER — PREGABALIN 50 MG PO CAPS: 50.0000 mg | ORAL_CAPSULE | Freq: Two times a day (BID) | ORAL | 4 refills | Status: DC

## 2018-01-31 NOTE — Progress Notes (Signed)
[facility]    Provider:  Dr Jaynee Eagles Referring Provider: Charolette Forward, MD Primary Care Physician:  Charolette Forward, MD, [facility] Dr. Posey Pronto [facility])  CC:  Aching in both feet, numbness  HPI:  George [name]. is a 72 y.o. male here as requested by Dr. Terrence Dupont for numbness and tingling. PMHx CKD, HTN, HLD Vit B12 deficiency, Vit D Deficiency, prediabetes. He has had neuropathy for about 10 years now. It started in 2009 after colon cancer, s/p chemo and since then the neuropathy bothers him. Feels like he hit his funny bone on something and his foot being asleep. Not painful just aggravating, it wakes him up from sleep, it bothers when he walks, wearing shoes helps. Just the bottom of feet, continuous, tingling. Not painful but is uncomfortable and wakes him up at night. He was taking Gabapentin and he had weird dreams. He has not tried anything else, just living with it. Stable, not worsening. Symmetric in both feet. He feels possible "slapping" of his right foot. No autonomic complaints. No other focal neurologic deficits, associated symptoms, inciting events or modifiable factors.  Reviewed notes, labs and imaging from outside physicians, which showed:  10/21/2017 BUN 18 and Creat 1.64  CT head: IMPRESSION: Small lacunar infarct in the left basal ganglia - no definite acute Abnormality. Personally reviewed images and agree.  Review of Systems: Patient complains of symptoms per HPI as well as the following symptoms: insomnia, numbness, depression, anxiety, hearing loss. Pertinent negatives and positives per HPI. All others negative.   Social History   Socioeconomic History  . Marital status: Widowed    Spouse name: Not on file  . Number of children: 42  . Years of education: Not on file  . Highest education level: Not on file  Occupational History  . Occupation: retire  Social Needs  . Financial resource strain: Not on file  . Food insecurity:   Worry: Not on file    Inability: Not on file  . Transportation needs:    Medical: Not on file    Non-medical: Not on file  Tobacco Use  . Smoking status: Former Smoker    Packs/day: 0.25    Years: 3.00    Pack years: 0.75    Types: Cigarettes    Last attempt to quit: 04/12/1972    Years since quitting: 45.8  . Smokeless tobacco: Never Used  . Tobacco comment: smoked 1 cigarette per day SOME DAYS  Substance and Sexual Activity  . Alcohol use: Yes    Alcohol/week: 1.0 standard drinks    Types: 1 Cans of beer per week    Comment: OCCASIONAL  . Drug use: No  . Sexual activity: Yes  Lifestyle  . Physical activity:    Days per week: Not on file    Minutes per session: Not on file  . Stress: Not on file  Relationships  . Social connections:    Talks on phone: Not on file    Gets together: Not on file    Attends religious service: Not on file    Active member of club or organization: Not on file    Attends meetings of clubs or organizations: Not on file    Relationship status: Not on file  . Intimate partner violence:    Fear of current or ex partner: Not on file    Emotionally abused: Not on file    Physically abused: Not on file    Forced sexual activity: Not on file  Other Topics Concern  . Not on file  Social History Narrative   Shawnie Dapper will take care of him after his surgery    Her cell is [phone]    Family History  Problem Relation Age of Onset  . Stroke Father   . Cancer Brother        neoplasm of brain    Past Medical History:  Diagnosis Date  . Anxiety   . Arthritis    OSTEO IN KNEE  . Asthma    as child  . Colon cancer ([place])   . Depression   . Elevated prostate specific antigen (PSA)   . Generalized anxiety disorder 04/09/2015  . Hematospermia   . Hx antineoplastic chemotherapy 2010  . Hypertension   . 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 Incomplete bladder emptying   . Malignant neoplasm of prostate  ([place])   . Nodular prostate with lower urinary tract symptoms   . Panic attacks 04/09/2015  . Primary localized osteoarthrosis of the knee, right 05/19/2016  . Prostatitis   . S/P total knee arthroplasty, left 05/19/2016  . Spermatocele   . Weak urinary stream     Past Surgical History:  Procedure Laterality Date  . COLON RESECTION   NOV 2009  . COLONOSCOPY    . PROSTATE BIOPSY    . SPERMATOCELECTOMY Left 05/07/2014   Procedure: LEFT SPERMATOCELECTOMY;  Surgeon: Malka So, MD;  Location: [facility];  Service: Urology;  Laterality: Left;  . TONSILLECTOMY    . TOTAL KNEE ARTHROPLASTY Left 04/21/2015   Procedure: LEFT TOTAL KNEE ARTHROPLASTY;  Surgeon: Elsie Saas, MD;  Location: [facility];  Service: Orthopedics;  Laterality: Left;  . TOTAL KNEE ARTHROPLASTY Right 05/31/2016   Procedure: TOTAL KNEE ARTHROPLASTY;  Surgeon: Elsie Saas, MD;  Location: [facility];  Service: Orthopedics;  Laterality: Right;  . TRANSURETHRAL RESECTION OF PROSTATE N/A 05/07/2014   Procedure: TRANSURETHRAL RESECTION OF THE PROSTATE WITH GYRUS INSTRUMENTS;  Surgeon: Malka So, MD;  Location: [facility];  Service: Urology;  Laterality: N/A;    Current Outpatient Medications  Medication Sig Dispense Refill  . atorvastatin (LIPITOR) 20 MG tablet Take 20 mg by mouth daily.    Marland Kitchen buPROPion (WELLBUTRIN XL) 150 MG 24 hr tablet Take 450 mg by mouth every morning.     . felodipine (PLENDIL) 10 MG 24 hr tablet Take 1 tablet by mouth daily.    Marland Kitchen losartan (COZAAR) 100 MG tablet Take 100 mg by mouth daily.    . metoprolol succinate (TOPROL-XL) 100 MG 24 hr tablet Take 150 mg by mouth daily. Take with or immediately following a meal.     . metoprolol succinate (TOPROL-XL) 50 MG 24 hr tablet Take 50 mg by mouth daily. Take with or immediately following a meal.    . potassium chloride SA (K-DUR,KLOR-CON) 20 MEQ tablet Take 40 mEq by mouth daily.     Marland Kitchen spironolactone (ALDACTONE) 25 MG tablet Take 25 mg by mouth daily.    . Vitamin D,  Ergocalciferol, (DRISDOL) 50000 units CAPS capsule Take 50,000 Units by mouth every 7 (seven) days.    Marland Kitchen aspirin EC 81 MG tablet Take 1 tablet (81 mg total) by mouth daily. 30 tablet   . pregabalin (LYRICA) 50 MG capsule Take 1 capsule (50 mg total) by mouth 2 (two) times daily. 60 capsule 4   No current facility-administered medications for this visit.     Allergies as of 01/31/2018 -  Review Complete 01/31/2018  Allergen Reaction Noted  . Amlodipine Other (See Comments) 04/22/2014  . Gabapentin  01/31/2018    Vitals: BP (!) 172/82   Pulse (!) 54   Ht 6' (1.829 m)   Wt 205 lb (93 kg)   BMI 27.80 kg/m  Last Weight:  Wt Readings from Last 1 Encounters:  01/31/18 205 lb (93 kg)   Last Height:   Ht Readings from Last 1 Encounters:  01/31/18 6' (1.829 m)    Physical exam: Exam: Gen: NAD, conversant, well nourised, obese, well groomed                     CV: RRR, no +SEM. No Carotid Bruits. No peripheral edema, warm, nontender Eyes: Conjunctivae clear without exudates or hemorrhage  Neuro: Detailed Neurologic Exam  Speech:    Speech is normal; fluent and spontaneous with normal comprehension.  Cognition:    The patient is oriented to person, place, and time;     recent and remote memory intact;     language fluent;     normal attention, concentration,     fund of knowledge Cranial Nerves:    The pupils are equal, round, and reactive to light. The fundi are normal and spontaneous venous pulsations are present. Visual fields are full to finger confrontation. Extraocular movements are intact. Trigeminal sensation is intact and the muscles of mastication are normal. The face is symmetric. The palate elevates in the midline. Hearing intact. Voice is normal. Shoulder shrug is normal. The tongue has normal motion without fasciculations.   Coordination:    Normal finger to nose and heel to shin. Normal rapid alternating movements.   Gait:    Heel-toe and tandem gait are normal.   Mild imbalance with tandem.   Motor Observation:    No asymmetry, no atrophy, and no involuntary movements noted. Tone:    Normal muscle tone.    Posture:    Posture is normal. normal erect    Strength:    Strength is V/V in the upper and lower limbs.      Sensation: intact to LT, pinprick distally, temp, few seconds vibration distally.  Neg Romberg.      Reflex Exam:  DTR's:    Absent AJs otherwise deep tendon reflexes in the upper and lower extremities are normal bilaterally.   Toes:    Left upgoing? Difficult due to withdrawal.  Clonus:    Clonus is absent.      Assessment/Plan:   72 y.o. male here as requested by Dr. Terrence Dupont for numbness and tingling. PMHx CKD, HTN, HLD Vit B12 deficiency, Vit D Deficiency, prediabetes. He has had stable distal neuropathy in his feet since chemotherapy about 10 years ago. Other identified risk factors include pre-diabetes and b12 deficiency.   - He has B12 deficiency, says he is followed by [facility] Dr. Posey Pronto and takes supplements. He follows with Dr. Posey Pronto for Prediabetes. Discussed that both of these can worsen neuropathy and follow with Dr. Posey Pronto for close management.  - Patient feels he has some foot drop on the right, I don;t appreciate any weakness but will send to physical therapy for right foot drop exercises if worsens come back to clinic.  - Unfortunately there is no cure,  we can give patient medication to help with symptoms. He does not need to follow up with neurology for this, can be seen by pcp. Will start very low dose Lyrica 50mg  bid. Will send this to Dr.  Posey Pronto as fyi since patient has CKD and Lyrica is metabolized through the kidneys and he declines lab testing today. Last creat 1.64 which would make his CrCl 61ml/min ( Lyrica renal dosing 30-63ml/min dose 75 to 300 mg/day in 2 to 3 divided doses)  -   Patient declines B12 and Hgba1c testing or any further labs for other causes of neuropathy. I recommend if his B12  is still low that he consider injections, discussed with patient. Needs close follow up for management of hgba1c and other vascular risk factors.  - fall risk, discussed fall precautions  - recommend ASA 81mg  daily for secondary stroke prevention(CT showed lacunar infarct) which patient reports taking, and maintain strict control of hypertension with blood pressure goal below 130/90, diabetes with hemoglobin A1c goal below 6.5% and lipids with LDL cholesterol goal below 70 mg/dL. Discussed vascular risk factor in both stroke and neuropathy.   - patient can follow with pcp for management of polyneuropathy pain, nothing further from neurology but patient is welcome to come back to clinic anytime especially for worsening.  Meds ordered this encounter  Medications  . pregabalin (LYRICA) 50 MG capsule    Sig: Take 1 capsule (50 mg total) by mouth 2 (two) times daily.    Dispense:  60 capsule    Refill:  4  . aspirin EC 81 MG tablet    Sig: Take 1 tablet (81 mg total) by mouth daily.    Dispense:  30 tablet   Orders Placed This Encounter  Procedures  . Ambulatory referral to Physical Therapy   Cc: Dr. Terrence Dupont and Dr. Arsenio Katz, [facility] [address] [place], [place] [address]  Phone [phone] Fax [phone]

## 2018-01-31 NOTE — Telephone Encounter (Signed)
Faxed Lyrica prescription to [facility]. Received a receipt of confirmation.

## 2018-01-31 NOTE — Patient Instructions (Signed)
Start Lyrica twice daily   Peripheral Neuropathy Peripheral neuropathy is a type of nerve damage. It affects nerves that carry signals between the spinal cord and other parts of the body. These are called peripheral nerves. With peripheral neuropathy, one nerve or a group of nerves may be damaged. What are the causes? Many things can damage peripheral nerves. For some people with peripheral neuropathy, the cause is unknown. Some causes include:  Diabetes. This is the most common cause of peripheral neuropathy.  Injury to a nerve.  Pressure or stress on a nerve that lasts a long time.  Too little vitamin B. Alcoholism can lead to this.  Infections.  Autoimmune diseases, such as multiple sclerosis and systemic lupus erythematosus.  Inherited nerve diseases.  Some medicines, such as cancer drugs.  Toxic substances, such as lead and mercury.  Too little blood flowing to the legs.  Kidney disease.  Thyroid disease.  What are the signs or symptoms? Different people have different symptoms. The symptoms you have will depend on which of your nerves is damaged. Common symptoms include:  Loss of feeling (numbness) in the feet and hands.  Tingling in the feet and hands.  Pain that burns.  Very sensitive skin.  Weakness.  Not being able to move a part of the body (paralysis).  Muscle twitching.  Clumsiness or poor coordination.  Loss of balance.  Not being able to control your bladder.  Feeling dizzy.  Sexual problems.  How is this diagnosed? Peripheral neuropathy is a symptom, not a disease. Finding the cause of peripheral neuropathy can be hard. To figure that out, your health care provider will take a medical history and do a physical exam. A neurological exam will also be done. This involves checking things affected by your brain, spinal cord, and nerves (nervous system). For example, your health care provider will check your reflexes, how you move, and what you  can feel. Other types of tests may also be ordered, such as:  Blood tests.  A test of the fluid in your spinal cord.  Imaging tests, such as CT scans or an MRI.  Electromyography (EMG). This test checks the nerves that control muscles.  Nerve conduction velocity tests. These tests check how fast messages pass through your nerves.  Nerve biopsy. A small piece of nerve is removed. It is then checked under a microscope.  How is this treated?  Medicine is often used to treat peripheral neuropathy. Medicines may include: ? Pain-relieving medicines. Prescription or over-the-counter medicine may be suggested. ? Antiseizure medicine. This may be used for pain. ? Antidepressants. These also may help ease pain from neuropathy. ? Lidocaine. This is a numbing medicine. You might wear a patch or be given a shot. ? Mexiletine. This medicine is typically used to help control irregular heart rhythms.  Surgery. Surgery may be needed to relieve pressure on a nerve or to destroy a nerve that is causing pain.  Physical therapy to help movement.  Assistive devices to help movement. Follow these instructions at home:  Only take over-the-counter or prescription medicines as directed by your health care provider. Follow the instructions carefully for any given medicines. Do not take any other medicines without first getting approval from your health care provider.  If you have diabetes, work closely with your health care provider to keep your blood sugar under control.  If you have numbness in your feet: ? Check every day for signs of injury or infection. Watch for redness, warmth, and swelling. ?  Wear padded socks and comfortable shoes. These help protect your feet.  Do not do things that put pressure on your damaged nerve.  Do not smoke. Smoking keeps blood from getting to damaged nerves.  Avoid or limit alcohol. Too much alcohol can cause a lack of B vitamins. These vitamins are needed for  healthy nerves.  Develop a good support system. Coping with peripheral neuropathy can be stressful. Talk to a mental health specialist or join a support group if you are struggling.  Follow up with your health care provider as directed. Contact a health care provider if:  You have new signs or symptoms of peripheral neuropathy.  You are struggling emotionally from dealing with peripheral neuropathy.  You have a fever. Get help right away if:  You have an injury or infection that is not healing.  You feel very dizzy or begin vomiting.  You have chest pain.  You have trouble breathing. This information is not intended to replace advice given to you by your health care provider. Make sure you discuss any questions you have with your health care provider. Document Released: 03/19/2002 Document Revised: 09/04/2015 Document Reviewed: 12/04/2012 Elsevier Interactive Patient Education  2017 Elsevier Inc.  Pregabalin capsules What is this medicine? PREGABALIN (pre GAB a lin) is used to treat nerve pain from diabetes, shingles, spinal cord injury, and fibromyalgia. It is also used to control seizures in epilepsy. This medicine may be used for other purposes; ask your health care provider or pharmacist if you have questions. COMMON BRAND NAME(S): Lyrica What should I tell my health care provider before I take this medicine? They need to know if you have any of these conditions: -bleeding problems -heart disease, including heart failure -history of alcohol or drug abuse -kidney disease -suicidal thoughts, plans, or attempt; a previous suicide attempt by you or a family member -an unusual or allergic reaction to pregabalin, gabapentin, other medicines, foods, dyes, or preservatives -pregnant or trying to get pregnant or trying to conceive with your partner -breast-feeding How should I use this medicine? Take this medicine by mouth with a glass of water. Follow the directions on the  prescription label. You can take this medicine with or without food. Take your doses at regular intervals. Do not take your medicine more often than directed. Do not stop taking except on your doctor's advice. A special MedGuide will be given to you by the pharmacist with each prescription and refill. Be sure to read this information carefully each time. Talk to your pediatrician regarding the use of this medicine in children. Special care may be needed. Overdosage: If you think you have taken too much of this medicine contact a poison control center or emergency room at once. NOTE: This medicine is only for you. Do not share this medicine with others. What if I miss a dose? If you miss a dose, take it as soon as you can. If it is almost time for your next dose, take only that dose. Do not take double or extra doses. What may interact with this medicine? -alcohol -certain medicines for blood pressure like captopril, enalapril, or lisinopril -certain medicines for diabetes, like pioglitazone or rosiglitazone -certain medicines for anxiety or sleep -narcotic medicines for pain This list may not describe all possible interactions. Give your health care provider a list of all the medicines, herbs, non-prescription drugs, or dietary supplements you use. Also tell them if you smoke, drink alcohol, or use illegal drugs. Some items may interact with your  medicine. What should I watch for while using this medicine? Tell your doctor or healthcare professional if your symptoms do not start to get better or if they get worse. Visit your doctor or health care professional for regular checks on your progress. Do not stop taking except on your doctor's advice. You may develop a severe reaction. Your doctor will tell you how much medicine to take. Wear a medical identification bracelet or chain if you are taking this medicine for seizures, and carry a card that describes your disease and details of your medicine and  dosage times. You may get drowsy or dizzy. Do not drive, use machinery, or do anything that needs mental alertness until you know how this medicine affects you. Do not stand or sit up quickly, especially if you are an older patient. This reduces the risk of dizzy or fainting spells. Alcohol may interfere with the effect of this medicine. Avoid alcoholic drinks. If you have a heart condition, like congestive heart failure, and notice that you are retaining water and have swelling in your hands or feet, contact your health care provider immediately. The use of this medicine may increase the chance of suicidal thoughts or actions. Pay special attention to how you are responding while on this medicine. Any worsening of mood, or thoughts of suicide or dying should be reported to your health care professional right away. This medicine has caused reduced sperm counts in some men. This may interfere with the ability to father a child. You should talk to your doctor or health care professional if you are concerned about your fertility. Women who become pregnant while using this medicine for seizures may enroll in the [place] Pregnancy Registry by calling [phone]. This registry collects information about the safety of antiepileptic drug use during pregnancy. What side effects may I notice from receiving this medicine? Side effects that you should report to your doctor or health care professional as soon as possible: -allergic reactions like skin rash, itching or hives, swelling of the face, lips, or tongue -breathing problems -changes in vision -chest pain -confusion -jerking or unusual movements of any part of your body -loss of memory -muscle pain, tenderness, or weakness -suicidal thoughts or other mood changes -swelling of the ankles, feet, hands -unusual bruising or bleeding Side effects that usually do not require medical attention (report to your doctor or health care  professional if they continue or are bothersome): -dizziness -drowsiness -dry mouth -headache -nausea -tremors -trouble sleeping -weight gain This list may not describe all possible side effects. Call your doctor for medical advice about side effects. You may report side effects to FDA at 1-800-FDA-1088. Where should I keep my medicine? Keep out of the reach of children. This medicine can be abused. Keep your medicine in a safe place to protect it from theft. Do not share this medicine with anyone. Selling or giving away this medicine is dangerous and against the law. This medicine may cause accidental overdose and death if it taken by other adults, children, or pets. Mix any unused medicine with a substance like cat litter or coffee grounds. Then throw the medicine away in a sealed container like a sealed bag or a coffee can with a lid. Do not use the medicine after the expiration date. Store at room temperature between 15 and 30 degrees C (59 and 86 degrees F). NOTE: This sheet is a summary. It may not cover all possible information. If you have questions about this medicine,  talk to your doctor, pharmacist, or health care provider.  2018 Elsevier/Gold Standard (2015-05-01 10:26:12)

## 2018-02-24 DIAGNOSIS — N189 Chronic kidney disease, unspecified: Secondary | ICD-10-CM | POA: Diagnosis not present

## 2018-02-24 DIAGNOSIS — I10 Essential (primary) hypertension: Secondary | ICD-10-CM | POA: Diagnosis not present

## 2018-02-24 DIAGNOSIS — E538 Deficiency of other specified B group vitamins: Secondary | ICD-10-CM | POA: Diagnosis not present

## 2018-02-24 DIAGNOSIS — E785 Hyperlipidemia, unspecified: Secondary | ICD-10-CM | POA: Diagnosis not present

## 2018-02-24 DIAGNOSIS — R7303 Prediabetes: Secondary | ICD-10-CM | POA: Diagnosis not present

## 2018-02-24 DIAGNOSIS — E559 Vitamin D deficiency, unspecified: Secondary | ICD-10-CM | POA: Diagnosis not present

## 2018-06-14 DIAGNOSIS — L602 Onychogryphosis: Secondary | ICD-10-CM | POA: Diagnosis not present

## 2018-06-14 DIAGNOSIS — B351 Tinea unguium: Secondary | ICD-10-CM | POA: Diagnosis not present

## 2018-06-14 DIAGNOSIS — R2241 Localized swelling, mass and lump, right lower limb: Secondary | ICD-10-CM | POA: Diagnosis not present

## 2018-06-14 DIAGNOSIS — M25571 Pain in right ankle and joints of right foot: Secondary | ICD-10-CM | POA: Diagnosis not present

## 2018-06-14 DIAGNOSIS — M19071 Primary osteoarthritis, right ankle and foot: Secondary | ICD-10-CM | POA: Diagnosis not present

## 2018-06-20 DIAGNOSIS — B351 Tinea unguium: Secondary | ICD-10-CM | POA: Diagnosis not present

## 2018-06-30 DIAGNOSIS — E785 Hyperlipidemia, unspecified: Secondary | ICD-10-CM | POA: Diagnosis not present

## 2018-06-30 DIAGNOSIS — E538 Deficiency of other specified B group vitamins: Secondary | ICD-10-CM | POA: Diagnosis not present

## 2018-06-30 DIAGNOSIS — I10 Essential (primary) hypertension: Secondary | ICD-10-CM | POA: Diagnosis not present

## 2018-06-30 DIAGNOSIS — E559 Vitamin D deficiency, unspecified: Secondary | ICD-10-CM | POA: Diagnosis not present

## 2018-06-30 DIAGNOSIS — R7303 Prediabetes: Secondary | ICD-10-CM | POA: Diagnosis not present

## 2018-06-30 DIAGNOSIS — N189 Chronic kidney disease, unspecified: Secondary | ICD-10-CM | POA: Diagnosis not present

## 2018-08-28 DIAGNOSIS — E559 Vitamin D deficiency, unspecified: Secondary | ICD-10-CM | POA: Diagnosis not present

## 2018-08-28 DIAGNOSIS — R0609 Other forms of dyspnea: Secondary | ICD-10-CM | POA: Diagnosis not present

## 2018-08-28 DIAGNOSIS — E538 Deficiency of other specified B group vitamins: Secondary | ICD-10-CM | POA: Diagnosis not present

## 2018-08-28 DIAGNOSIS — N189 Chronic kidney disease, unspecified: Secondary | ICD-10-CM | POA: Diagnosis not present

## 2018-08-28 DIAGNOSIS — I10 Essential (primary) hypertension: Secondary | ICD-10-CM | POA: Diagnosis not present

## 2018-08-28 DIAGNOSIS — R7303 Prediabetes: Secondary | ICD-10-CM | POA: Diagnosis not present

## 2018-08-28 DIAGNOSIS — E785 Hyperlipidemia, unspecified: Secondary | ICD-10-CM | POA: Diagnosis not present

## 2018-09-29 DIAGNOSIS — E538 Deficiency of other specified B group vitamins: Secondary | ICD-10-CM | POA: Diagnosis not present

## 2018-09-29 DIAGNOSIS — N189 Chronic kidney disease, unspecified: Secondary | ICD-10-CM | POA: Diagnosis not present

## 2018-09-29 DIAGNOSIS — R7303 Prediabetes: Secondary | ICD-10-CM | POA: Diagnosis not present

## 2018-09-29 DIAGNOSIS — I10 Essential (primary) hypertension: Secondary | ICD-10-CM | POA: Diagnosis not present

## 2018-09-29 DIAGNOSIS — E559 Vitamin D deficiency, unspecified: Secondary | ICD-10-CM | POA: Diagnosis not present

## 2018-09-29 DIAGNOSIS — I5020 Unspecified systolic (congestive) heart failure: Secondary | ICD-10-CM | POA: Diagnosis not present

## 2019-03-06 DIAGNOSIS — C61 Malignant neoplasm of prostate: Secondary | ICD-10-CM | POA: Diagnosis not present

## 2019-03-06 DIAGNOSIS — M545 Low back pain: Secondary | ICD-10-CM | POA: Diagnosis not present

## 2019-03-16 DIAGNOSIS — R7303 Prediabetes: Secondary | ICD-10-CM | POA: Diagnosis not present

## 2019-03-16 DIAGNOSIS — E538 Deficiency of other specified B group vitamins: Secondary | ICD-10-CM | POA: Diagnosis not present

## 2019-03-16 DIAGNOSIS — E559 Vitamin D deficiency, unspecified: Secondary | ICD-10-CM | POA: Diagnosis not present

## 2019-03-16 DIAGNOSIS — I5030 Unspecified diastolic (congestive) heart failure: Secondary | ICD-10-CM | POA: Diagnosis not present

## 2019-03-16 DIAGNOSIS — I10 Essential (primary) hypertension: Secondary | ICD-10-CM | POA: Diagnosis not present

## 2019-03-16 DIAGNOSIS — N189 Chronic kidney disease, unspecified: Secondary | ICD-10-CM | POA: Diagnosis not present

## 2019-03-20 DIAGNOSIS — I129 Hypertensive chronic kidney disease with stage 1 through stage 4 chronic kidney disease, or unspecified chronic kidney disease: Secondary | ICD-10-CM | POA: Diagnosis not present

## 2019-03-20 DIAGNOSIS — N183 Chronic kidney disease, stage 3 unspecified: Secondary | ICD-10-CM | POA: Diagnosis not present

## 2019-03-20 DIAGNOSIS — N2581 Secondary hyperparathyroidism of renal origin: Secondary | ICD-10-CM | POA: Diagnosis not present

## 2019-03-20 DIAGNOSIS — D631 Anemia in chronic kidney disease: Secondary | ICD-10-CM | POA: Diagnosis not present

## 2019-04-16 ENCOUNTER — Other Ambulatory Visit: Payer: Self-pay | Admitting: Cardiology

## 2019-04-16 ENCOUNTER — Ambulatory Visit
Admission: RE | Admit: 2019-04-16 | Discharge: 2019-04-16 | Disposition: A | Discharge status: Home / Self Care | Payer: Medicare Other | Source: Ambulatory Visit | Attending: Cardiology | Admitting: Cardiology

## 2019-04-16 DIAGNOSIS — R0600 Dyspnea, unspecified: Secondary | ICD-10-CM

## 2019-06-15 DIAGNOSIS — I5030 Unspecified diastolic (congestive) heart failure: Secondary | ICD-10-CM | POA: Diagnosis not present

## 2019-06-15 DIAGNOSIS — R7303 Prediabetes: Secondary | ICD-10-CM | POA: Diagnosis not present

## 2019-06-15 DIAGNOSIS — E559 Vitamin D deficiency, unspecified: Secondary | ICD-10-CM | POA: Diagnosis not present

## 2019-06-15 DIAGNOSIS — E538 Deficiency of other specified B group vitamins: Secondary | ICD-10-CM | POA: Diagnosis not present

## 2019-06-15 DIAGNOSIS — E785 Hyperlipidemia, unspecified: Secondary | ICD-10-CM | POA: Diagnosis not present

## 2019-06-15 DIAGNOSIS — N189 Chronic kidney disease, unspecified: Secondary | ICD-10-CM | POA: Diagnosis not present

## 2019-06-15 DIAGNOSIS — I10 Essential (primary) hypertension: Secondary | ICD-10-CM | POA: Diagnosis not present

## 2019-07-04 DIAGNOSIS — J343 Hypertrophy of nasal turbinates: Secondary | ICD-10-CM | POA: Diagnosis not present

## 2019-07-04 DIAGNOSIS — R040 Epistaxis: Secondary | ICD-10-CM | POA: Insufficient documentation

## 2019-07-04 DIAGNOSIS — J342 Deviated nasal septum: Secondary | ICD-10-CM | POA: Diagnosis not present

## 2019-07-04 DIAGNOSIS — J310 Chronic rhinitis: Secondary | ICD-10-CM | POA: Diagnosis not present

## 2019-09-24 DIAGNOSIS — I10 Essential (primary) hypertension: Secondary | ICD-10-CM | POA: Diagnosis not present

## 2019-09-24 DIAGNOSIS — N189 Chronic kidney disease, unspecified: Secondary | ICD-10-CM | POA: Diagnosis not present

## 2019-09-24 DIAGNOSIS — R7303 Prediabetes: Secondary | ICD-10-CM | POA: Diagnosis not present

## 2019-09-24 DIAGNOSIS — I5030 Unspecified diastolic (congestive) heart failure: Secondary | ICD-10-CM | POA: Diagnosis not present

## 2019-09-24 DIAGNOSIS — E785 Hyperlipidemia, unspecified: Secondary | ICD-10-CM | POA: Diagnosis not present

## 2019-10-31 ENCOUNTER — Ambulatory Visit (INDEPENDENT_AMBULATORY_CARE_PROVIDER_SITE_OTHER): Payer: Medicare Other | Admitting: Otolaryngology

## 2019-11-12 DIAGNOSIS — Z8546 Personal history of malignant neoplasm of prostate: Secondary | ICD-10-CM | POA: Diagnosis not present

## 2019-11-12 DIAGNOSIS — N5201 Erectile dysfunction due to arterial insufficiency: Secondary | ICD-10-CM | POA: Diagnosis not present

## 2019-12-26 DIAGNOSIS — E785 Hyperlipidemia, unspecified: Secondary | ICD-10-CM | POA: Diagnosis not present

## 2019-12-26 DIAGNOSIS — I10 Essential (primary) hypertension: Secondary | ICD-10-CM | POA: Diagnosis not present

## 2019-12-26 DIAGNOSIS — N189 Chronic kidney disease, unspecified: Secondary | ICD-10-CM | POA: Diagnosis not present

## 2019-12-26 DIAGNOSIS — I5030 Unspecified diastolic (congestive) heart failure: Secondary | ICD-10-CM | POA: Diagnosis not present

## 2020-01-31 DIAGNOSIS — M25552 Pain in left hip: Secondary | ICD-10-CM | POA: Diagnosis not present

## 2020-01-31 DIAGNOSIS — M19011 Primary osteoarthritis, right shoulder: Secondary | ICD-10-CM | POA: Diagnosis not present

## 2020-02-07 DIAGNOSIS — M25552 Pain in left hip: Secondary | ICD-10-CM | POA: Diagnosis not present

## 2020-02-16 DIAGNOSIS — Z23 Encounter for immunization: Secondary | ICD-10-CM | POA: Diagnosis not present

## 2020-02-27 DIAGNOSIS — M25552 Pain in left hip: Secondary | ICD-10-CM | POA: Diagnosis not present

## 2020-03-15 DIAGNOSIS — M545 Low back pain, unspecified: Secondary | ICD-10-CM | POA: Diagnosis not present

## 2020-03-19 DIAGNOSIS — M545 Low back pain, unspecified: Secondary | ICD-10-CM | POA: Diagnosis not present

## 2020-04-07 DIAGNOSIS — M5416 Radiculopathy, lumbar region: Secondary | ICD-10-CM | POA: Diagnosis not present

## 2020-04-23 DIAGNOSIS — M5416 Radiculopathy, lumbar region: Secondary | ICD-10-CM | POA: Diagnosis not present

## 2020-04-25 DIAGNOSIS — M5416 Radiculopathy, lumbar region: Secondary | ICD-10-CM | POA: Diagnosis not present

## 2020-05-12 DIAGNOSIS — D631 Anemia in chronic kidney disease: Secondary | ICD-10-CM | POA: Diagnosis not present

## 2020-05-12 DIAGNOSIS — N2581 Secondary hyperparathyroidism of renal origin: Secondary | ICD-10-CM | POA: Diagnosis not present

## 2020-05-12 DIAGNOSIS — I129 Hypertensive chronic kidney disease with stage 1 through stage 4 chronic kidney disease, or unspecified chronic kidney disease: Secondary | ICD-10-CM | POA: Diagnosis not present

## 2020-05-12 DIAGNOSIS — N189 Chronic kidney disease, unspecified: Secondary | ICD-10-CM | POA: Diagnosis not present

## 2020-05-12 DIAGNOSIS — N183 Chronic kidney disease, stage 3 unspecified: Secondary | ICD-10-CM | POA: Diagnosis not present

## 2020-05-15 ENCOUNTER — Other Ambulatory Visit: Payer: Self-pay | Admitting: Internal Medicine

## 2020-05-15 DIAGNOSIS — N183 Chronic kidney disease, stage 3 unspecified: Secondary | ICD-10-CM

## 2020-05-27 ENCOUNTER — Ambulatory Visit
Admission: RE | Admit: 2020-05-27 | Discharge: 2020-05-27 | Disposition: A | Discharge status: Home / Self Care | Payer: Medicare Other | Source: Ambulatory Visit | Attending: Internal Medicine | Admitting: Internal Medicine

## 2020-05-27 DIAGNOSIS — N183 Chronic kidney disease, stage 3 unspecified: Secondary | ICD-10-CM

## 2020-06-02 DIAGNOSIS — Z8546 Personal history of malignant neoplasm of prostate: Secondary | ICD-10-CM | POA: Diagnosis not present

## 2020-06-09 DIAGNOSIS — Z8546 Personal history of malignant neoplasm of prostate: Secondary | ICD-10-CM | POA: Diagnosis not present

## 2020-06-09 DIAGNOSIS — N5201 Erectile dysfunction due to arterial insufficiency: Secondary | ICD-10-CM | POA: Diagnosis not present

## 2020-08-11 DIAGNOSIS — E785 Hyperlipidemia, unspecified: Secondary | ICD-10-CM | POA: Diagnosis not present

## 2020-08-11 DIAGNOSIS — R7303 Prediabetes: Secondary | ICD-10-CM | POA: Diagnosis not present

## 2020-08-11 DIAGNOSIS — E559 Vitamin D deficiency, unspecified: Secondary | ICD-10-CM | POA: Diagnosis not present

## 2020-09-01 DIAGNOSIS — D631 Anemia in chronic kidney disease: Secondary | ICD-10-CM | POA: Diagnosis not present

## 2020-09-01 DIAGNOSIS — N2581 Secondary hyperparathyroidism of renal origin: Secondary | ICD-10-CM | POA: Diagnosis not present

## 2020-09-01 DIAGNOSIS — I129 Hypertensive chronic kidney disease with stage 1 through stage 4 chronic kidney disease, or unspecified chronic kidney disease: Secondary | ICD-10-CM | POA: Diagnosis not present

## 2020-09-01 DIAGNOSIS — N1832 Chronic kidney disease, stage 3b: Secondary | ICD-10-CM | POA: Diagnosis not present

## 2020-09-26 DIAGNOSIS — L600 Ingrowing nail: Secondary | ICD-10-CM | POA: Diagnosis not present

## 2020-10-01 DIAGNOSIS — E785 Hyperlipidemia, unspecified: Secondary | ICD-10-CM | POA: Diagnosis not present

## 2020-10-01 DIAGNOSIS — I5030 Unspecified diastolic (congestive) heart failure: Secondary | ICD-10-CM | POA: Diagnosis not present

## 2020-10-01 DIAGNOSIS — E538 Deficiency of other specified B group vitamins: Secondary | ICD-10-CM | POA: Diagnosis not present

## 2020-10-01 DIAGNOSIS — I10 Essential (primary) hypertension: Secondary | ICD-10-CM | POA: Diagnosis not present

## 2020-10-01 DIAGNOSIS — E559 Vitamin D deficiency, unspecified: Secondary | ICD-10-CM | POA: Diagnosis not present

## 2020-10-01 DIAGNOSIS — N189 Chronic kidney disease, unspecified: Secondary | ICD-10-CM | POA: Diagnosis not present

## 2020-10-01 DIAGNOSIS — I959 Hypotension, unspecified: Secondary | ICD-10-CM | POA: Diagnosis not present

## 2020-10-28 DIAGNOSIS — Z23 Encounter for immunization: Secondary | ICD-10-CM | POA: Diagnosis not present

## 2020-12-03 DIAGNOSIS — C61 Malignant neoplasm of prostate: Secondary | ICD-10-CM | POA: Diagnosis not present

## 2020-12-08 ENCOUNTER — Emergency Department (HOSPITAL_BASED_OUTPATIENT_CLINIC_OR_DEPARTMENT_OTHER): Payer: Medicare Other | Admitting: Radiology

## 2020-12-08 ENCOUNTER — Encounter (HOSPITAL_BASED_OUTPATIENT_CLINIC_OR_DEPARTMENT_OTHER): Payer: Self-pay

## 2020-12-08 ENCOUNTER — Emergency Department (HOSPITAL_BASED_OUTPATIENT_CLINIC_OR_DEPARTMENT_OTHER)
Admission: EM | Admit: 2020-12-08 | Discharge: 2020-12-08 | Discharge status: Against Medical Advice | Payer: Medicare Other | Attending: Emergency Medicine | Admitting: Emergency Medicine

## 2020-12-08 ENCOUNTER — Other Ambulatory Visit: Payer: Self-pay

## 2020-12-08 ENCOUNTER — Emergency Department (HOSPITAL_BASED_OUTPATIENT_CLINIC_OR_DEPARTMENT_OTHER): Admission: RE | Admit: 2020-12-08 | Payer: Medicare Other | Source: Ambulatory Visit

## 2020-12-08 DIAGNOSIS — Z87891 Personal history of nicotine dependence: Secondary | ICD-10-CM | POA: Insufficient documentation

## 2020-12-08 DIAGNOSIS — Z96653 Presence of artificial knee joint, bilateral: Secondary | ICD-10-CM | POA: Diagnosis not present

## 2020-12-08 DIAGNOSIS — Z8546 Personal history of malignant neoplasm of prostate: Secondary | ICD-10-CM | POA: Insufficient documentation

## 2020-12-08 DIAGNOSIS — M79605 Pain in left leg: Secondary | ICD-10-CM | POA: Insufficient documentation

## 2020-12-08 DIAGNOSIS — Z85038 Personal history of other malignant neoplasm of large intestine: Secondary | ICD-10-CM | POA: Diagnosis not present

## 2020-12-08 DIAGNOSIS — Z79899 Other long term (current) drug therapy: Secondary | ICD-10-CM | POA: Insufficient documentation

## 2020-12-08 DIAGNOSIS — I10 Essential (primary) hypertension: Secondary | ICD-10-CM | POA: Insufficient documentation

## 2020-12-08 DIAGNOSIS — M1612 Unilateral primary osteoarthritis, left hip: Secondary | ICD-10-CM | POA: Diagnosis not present

## 2020-12-08 MED ORDER — OXYCODONE-ACETAMINOPHEN 5-325 MG PO TABS
1.0000 | ORAL_TABLET | Freq: Once | ORAL | Status: AC
  Administered 2020-12-08: 1 via ORAL
  Filled 2020-12-08: qty 1

## 2020-12-08 NOTE — ED Notes (Signed)
ED Provider at bedside. 

## 2020-12-08 NOTE — ED Triage Notes (Signed)
Pt arrives POV with his wife with c/o left anterior thigh pain from left groin to left knee for approximately 10 days.  He has tried Tylenol and Bengay without improvement.  Pt walked from waiting area in NAD.

## 2020-12-08 NOTE — ED Provider Notes (Signed)
[facility] EMERGENCY DEPT Provider Note   CSN: [id number] Arrival date & time: 12/08/20  [id number]     History Chief Complaint  Patient presents with   Leg Pain    George [name]. is a 75 y.o. male.  Presents to ER with concern for leg pain.  Patient states that about 10 days ago he thinks he may have strained his left leg but did not recall any specific fall.  Has been having worsening pain ever since.  Pain is currently mild to moderate, in his left thigh that radiates to his left knee.  Is able to ambulate.  No numbness or weakness in his leg.  No skin color changes, no swelling.  Has history of bilateral knee replacements.  Believes it was [employer].  4 to 5 years ago.  HPI     Past Medical History:  Diagnosis Date   Anxiety    Arthritis    OSTEO IN KNEE   Asthma    as child   Colon cancer ([place])    Depression    Elevated prostate specific antigen (PSA)    Generalized anxiety disorder 04/09/2015   Hematospermia    Hx antineoplastic chemotherapy 2010   Hypertension    Idiopathic peripheral neuropathy    TOES OF BOTH FEET DUE TO CHEMO   Incomplete bladder emptying    Malignant neoplasm of prostate ([place])    Nodular prostate with lower urinary tract symptoms    Panic attacks 04/09/2015   Primary localized osteoarthrosis of the knee, right 05/19/2016   Prostatitis    S/P total knee arthroplasty, left 05/19/2016   Spermatocele    Weak urinary stream     Patient Active Problem List   Diagnosis Date Noted   Polyneuropathy 01/31/2018   Chemotherapy-induced neuropathy ([place]) 01/31/2018   B12 deficiency 01/31/2018   Prediabetes 01/31/2018   Basal ganglia infarction ([place]) 01/31/2018   SBO (small bowel obstruction) ([place]) 10/20/2017   Primary localized osteoarthritis of right knee 05/31/2016   Primary localized osteoarthrosis of the knee, right 05/19/2016   S/P total knee arthroplasty, left 05/19/2016   Dyspnea 01/20/2016   DJD (degenerative joint disease)  of knee 04/21/2015   Primary localized osteoarthritis of left knee 04/09/2015   Generalized anxiety disorder 04/09/2015   Panic attacks 04/09/2015   Spermatocele 05/08/2014   Benign localized hyperplasia of prostate with urinary obstruction 05/07/2014   Malignant neoplasm of prostate ([place]) 04/22/2014    Past Surgical History:  Procedure Laterality Date   COLON RESECTION   NOV 2009   COLONOSCOPY     PROSTATE BIOPSY     SPERMATOCELECTOMY Left 05/07/2014   Procedure: LEFT SPERMATOCELECTOMY;  Surgeon: Malka So, MD;  Location: [facility];  Service: Urology;  Laterality: Left;   TONSILLECTOMY     TOTAL KNEE ARTHROPLASTY Left 04/21/2015   Procedure: LEFT TOTAL KNEE ARTHROPLASTY;  Surgeon: Elsie Saas, MD;  Location: [facility];  Service: Orthopedics;  Laterality: Left;   TOTAL KNEE ARTHROPLASTY Right 05/31/2016   Procedure: TOTAL KNEE ARTHROPLASTY;  Surgeon: Elsie Saas, MD;  Location: [facility];  Service: Orthopedics;  Laterality: Right;   TRANSURETHRAL RESECTION OF PROSTATE N/A 05/07/2014   Procedure: TRANSURETHRAL RESECTION OF THE PROSTATE WITH GYRUS INSTRUMENTS;  Surgeon: Malka So, MD;  Location: [facility];  Service: Urology;  Laterality: N/A;       Family History  Problem Relation Age of Onset   Stroke Father    Cancer Brother        neoplasm  of brain    Social History   Tobacco Use   Smoking status: Former    Packs/day: 0.25    Years: 3.00    Pack years: 0.75    Types: Cigarettes    Quit date: 04/12/1972    Years since quitting: 48.6   Smokeless tobacco: Never   Tobacco comments:    smoked 1 cigarette per day SOME DAYS  Vaping Use   Vaping Use: Never used  Substance Use Topics   Alcohol use: Yes    Alcohol/week: 1.0 standard drink    Types: 1 Cans of beer per week    Comment: OCCASIONAL   Drug use: No    Home Medications Prior to Admission medications   Medication Sig Start Date End Date Taking? Authorizing Provider  atorvastatin (LIPITOR) 20 MG tablet Take 20 mg by  mouth daily.   Yes [provider]  buPROPion (WELLBUTRIN XL) 150 MG 24 hr tablet Take 450 mg by mouth every morning.    Yes [provider]  felodipine (PLENDIL) 10 MG 24 hr tablet Take 1 tablet by mouth daily. 11/21/15  Yes [provider]  hydrochlorothiazide (HYDRODIURIL) 25 MG tablet Take 25 mg by mouth daily.   Yes [provider]  losartan (COZAAR) 100 MG tablet Take 100 mg by mouth daily.   Yes [provider]  metoprolol succinate (TOPROL-XL) 50 MG 24 hr tablet Take 50 mg by mouth daily. Take with or immediately following a meal.   Yes [provider]  metoprolol succinate (TOPROL-XL) 100 MG 24 hr tablet Take 150 mg by mouth daily. Take with or immediately following a meal.     [provider]  potassium chloride SA (K-DUR,KLOR-CON) 20 MEQ tablet Take 40 mEq by mouth daily.  09/01/14   [provider]  pregabalin (LYRICA) 50 MG capsule Take 1 capsule (50 mg total) by mouth 2 (two) times daily. 01/31/18   Melvenia Beam, MD  spironolactone (ALDACTONE) 25 MG tablet Take 25 mg by mouth daily.    [provider]  Vitamin D, Ergocalciferol, (DRISDOL) 50000 units CAPS capsule Take 50,000 Units by mouth every 7 (seven) days.    [provider]    Allergies    Amlodipine and Gabapentin  Review of Systems   Review of Systems  Constitutional:  Negative for chills and fever.  HENT:  Negative for ear pain and sore throat.   Eyes:  Negative for pain and visual disturbance.  Respiratory:  Negative for cough and shortness of breath.   Cardiovascular:  Negative for chest pain and palpitations.  Gastrointestinal:  Negative for abdominal pain and vomiting.  Genitourinary:  Negative for dysuria and hematuria.  Musculoskeletal:  Positive for arthralgias. Negative for back pain.  Skin:  Negative for color change and rash.  Neurological:  Negative for seizures and syncope.  All other systems reviewed and are  negative.  Physical Exam Updated Vital Signs BP (!) 123/96 (BP Location: Right Arm)   Pulse 82   Temp 97.9 F (36.6 C) (Oral)   Resp 18   Ht 6' (1.829 m)   Wt 90.7 kg   SpO2 99%   BMI 27.12 kg/m   Physical Exam Vitals and nursing note reviewed.  Constitutional:      Appearance: He is well-developed.  HENT:     Head: Normocephalic and atraumatic.  Eyes:     Conjunctiva/sclera: Conjunctivae normal.  Cardiovascular:     Rate and Rhythm: Normal rate.  Pulses: Normal pulses.  Pulmonary:     Effort: Pulmonary effort is normal. No respiratory distress.  Musculoskeletal:        General: No deformity or signs of injury.     Cervical back: Neck supple.     Comments: LLE: some slight TTP to left upper leg, no swelling or deformity, normal dp/pt pulses, normal sensation, normal motor throughout  Skin:    General: Skin is warm and dry.  Neurological:     General: No focal deficit present.     Mental Status: He is alert.  Psychiatric:        Mood and Affect: Mood normal.        Behavior: Behavior normal.    ED Results / Procedures / Treatments   Labs (all labs ordered are listed, but only abnormal results are displayed) Labs Reviewed - No data to display  EKG None  Radiology DG Femur Min 2 Views Left  Result Date: 12/08/2020 CLINICAL DATA:  Left hip pain for several days, initial encounter EXAM: LEFT FEMUR 2 VIEWS COMPARISON:  None. FINDINGS: Visualized pelvic ring is intact. Degenerative changes of the right hip joint are noted. Degenerative changes of lumbar spine are seen as well. Left knee replacement is noted. No acute fracture or dislocation is seen. No soft tissue abnormality is noted. IMPRESSION: Mild degenerative change without acute abnormality. Electronically Signed   By: Inez Catalina M.D.   On: 12/08/2020 09:22    Procedures Procedures   Medications Ordered in ED Medications  oxyCODONE-acetaminophen (PERCOCET/ROXICET) 5-325 MG per tablet 1 tablet (1 tablet  Oral Given 12/08/20 [id number])    ED Course  I have reviewed the triage vital signs and the nursing notes.  Pertinent labs & imaging results that were available during my care of the patient were reviewed by me and considered in my medical decision making (see chart for details).    MDM Rules/Calculators/A&P                           75 year old male presents to ER with concern for left leg pain.  On exam neurovascularly intact.  No obvious physical exam abnormality.  Plain films negative.  My suspicion was low, had recommended a plan to check DVT study however patient left before treatment was complete and this test was not complete.    Final Clinical Impression(s) / ED Diagnoses Final diagnoses:  Pain of left lower extremity    Rx / DC Orders ED Discharge Orders     None        Lucrezia Starch, MD 12/09/20 [phone]

## 2020-12-08 NOTE — ED Notes (Signed)
Pt left before treatment complete.  Dr. Roslynn Amble notified.

## 2020-12-11 DIAGNOSIS — S76112A Strain of left quadriceps muscle, fascia and tendon, initial encounter: Secondary | ICD-10-CM | POA: Diagnosis not present

## 2020-12-11 DIAGNOSIS — M170 Bilateral primary osteoarthritis of knee: Secondary | ICD-10-CM | POA: Diagnosis not present

## 2021-01-19 DIAGNOSIS — N1832 Chronic kidney disease, stage 3b: Secondary | ICD-10-CM | POA: Diagnosis not present

## 2021-01-26 DIAGNOSIS — N2581 Secondary hyperparathyroidism of renal origin: Secondary | ICD-10-CM | POA: Diagnosis not present

## 2021-01-26 DIAGNOSIS — D631 Anemia in chronic kidney disease: Secondary | ICD-10-CM | POA: Diagnosis not present

## 2021-01-26 DIAGNOSIS — I129 Hypertensive chronic kidney disease with stage 1 through stage 4 chronic kidney disease, or unspecified chronic kidney disease: Secondary | ICD-10-CM | POA: Diagnosis not present

## 2021-01-26 DIAGNOSIS — N1832 Chronic kidney disease, stage 3b: Secondary | ICD-10-CM | POA: Diagnosis not present

## 2021-02-23 DIAGNOSIS — Z23 Encounter for immunization: Secondary | ICD-10-CM | POA: Diagnosis not present

## 2021-02-27 ENCOUNTER — Inpatient Hospital Stay (HOSPITAL_BASED_OUTPATIENT_CLINIC_OR_DEPARTMENT_OTHER)
Admission: EM | Admit: 2021-02-27 | Discharge: 2021-03-05 | DRG: 064 | Disposition: A | Discharge status: Rehabilitation Facility | Payer: Medicare Other | Attending: Student | Admitting: Student

## 2021-02-27 ENCOUNTER — Emergency Department (HOSPITAL_BASED_OUTPATIENT_CLINIC_OR_DEPARTMENT_OTHER): Payer: Medicare Other

## 2021-02-27 ENCOUNTER — Other Ambulatory Visit: Payer: Self-pay

## 2021-02-27 ENCOUNTER — Encounter (HOSPITAL_BASED_OUTPATIENT_CLINIC_OR_DEPARTMENT_OTHER): Payer: Self-pay | Admitting: Emergency Medicine

## 2021-02-27 DIAGNOSIS — I639 Cerebral infarction, unspecified: Secondary | ICD-10-CM | POA: Diagnosis present

## 2021-02-27 DIAGNOSIS — I63431 Cerebral infarction due to embolism of right posterior cerebral artery: Secondary | ICD-10-CM | POA: Diagnosis not present

## 2021-02-27 DIAGNOSIS — G8194 Hemiplegia, unspecified affecting left nondominant side: Secondary | ICD-10-CM | POA: Diagnosis not present

## 2021-02-27 DIAGNOSIS — F32A Depression, unspecified: Secondary | ICD-10-CM | POA: Diagnosis present

## 2021-02-27 DIAGNOSIS — E538 Deficiency of other specified B group vitamins: Secondary | ICD-10-CM | POA: Diagnosis present

## 2021-02-27 DIAGNOSIS — N179 Acute kidney failure, unspecified: Secondary | ICD-10-CM | POA: Diagnosis not present

## 2021-02-27 DIAGNOSIS — Z7901 Long term (current) use of anticoagulants: Secondary | ICD-10-CM | POA: Diagnosis not present

## 2021-02-27 DIAGNOSIS — E785 Hyperlipidemia, unspecified: Secondary | ICD-10-CM | POA: Diagnosis present

## 2021-02-27 DIAGNOSIS — Z79899 Other long term (current) drug therapy: Secondary | ICD-10-CM

## 2021-02-27 DIAGNOSIS — R9431 Abnormal electrocardiogram [ECG] [EKG]: Secondary | ICD-10-CM | POA: Diagnosis not present

## 2021-02-27 DIAGNOSIS — Z823 Family history of stroke: Secondary | ICD-10-CM

## 2021-02-27 DIAGNOSIS — T451X5A Adverse effect of antineoplastic and immunosuppressive drugs, initial encounter: Secondary | ICD-10-CM | POA: Diagnosis present

## 2021-02-27 DIAGNOSIS — G620 Drug-induced polyneuropathy: Secondary | ICD-10-CM | POA: Diagnosis not present

## 2021-02-27 DIAGNOSIS — R531 Weakness: Secondary | ICD-10-CM | POA: Diagnosis not present

## 2021-02-27 DIAGNOSIS — F411 Generalized anxiety disorder: Secondary | ICD-10-CM | POA: Diagnosis present

## 2021-02-27 DIAGNOSIS — Z9049 Acquired absence of other specified parts of digestive tract: Secondary | ICD-10-CM | POA: Diagnosis not present

## 2021-02-27 DIAGNOSIS — I129 Hypertensive chronic kidney disease with stage 1 through stage 4 chronic kidney disease, or unspecified chronic kidney disease: Secondary | ICD-10-CM | POA: Diagnosis not present

## 2021-02-27 DIAGNOSIS — I10 Essential (primary) hypertension: Secondary | ICD-10-CM | POA: Diagnosis not present

## 2021-02-27 DIAGNOSIS — I4891 Unspecified atrial fibrillation: Secondary | ICD-10-CM | POA: Diagnosis not present

## 2021-02-27 DIAGNOSIS — N1832 Chronic kidney disease, stage 3b: Secondary | ICD-10-CM | POA: Diagnosis not present

## 2021-02-27 DIAGNOSIS — Z96653 Presence of artificial knee joint, bilateral: Secondary | ICD-10-CM | POA: Diagnosis present

## 2021-02-27 DIAGNOSIS — F410 Panic disorder [episodic paroxysmal anxiety] without agoraphobia: Secondary | ICD-10-CM | POA: Diagnosis present

## 2021-02-27 DIAGNOSIS — Z20822 Contact with and (suspected) exposure to covid-19: Secondary | ICD-10-CM | POA: Diagnosis present

## 2021-02-27 DIAGNOSIS — J45909 Unspecified asthma, uncomplicated: Secondary | ICD-10-CM | POA: Diagnosis present

## 2021-02-27 DIAGNOSIS — H5347 Heteronymous bilateral field defects: Secondary | ICD-10-CM | POA: Diagnosis present

## 2021-02-27 DIAGNOSIS — H53462 Homonymous bilateral field defects, left side: Secondary | ICD-10-CM | POA: Diagnosis not present

## 2021-02-27 DIAGNOSIS — I629 Nontraumatic intracranial hemorrhage, unspecified: Secondary | ICD-10-CM | POA: Diagnosis not present

## 2021-02-27 DIAGNOSIS — I69392 Facial weakness following cerebral infarction: Secondary | ICD-10-CM | POA: Diagnosis not present

## 2021-02-27 DIAGNOSIS — Z888 Allergy status to other drugs, medicaments and biological substances status: Secondary | ICD-10-CM | POA: Diagnosis not present

## 2021-02-27 DIAGNOSIS — F05 Delirium due to known physiological condition: Secondary | ICD-10-CM | POA: Diagnosis not present

## 2021-02-27 DIAGNOSIS — T451X5S Adverse effect of antineoplastic and immunosuppressive drugs, sequela: Secondary | ICD-10-CM | POA: Diagnosis not present

## 2021-02-27 DIAGNOSIS — F3341 Major depressive disorder, recurrent, in partial remission: Secondary | ICD-10-CM | POA: Diagnosis not present

## 2021-02-27 DIAGNOSIS — I69354 Hemiplegia and hemiparesis following cerebral infarction affecting left non-dominant side: Secondary | ICD-10-CM | POA: Diagnosis not present

## 2021-02-27 DIAGNOSIS — Z87891 Personal history of nicotine dependence: Secondary | ICD-10-CM | POA: Diagnosis not present

## 2021-02-27 DIAGNOSIS — I6781 Acute cerebrovascular insufficiency: Secondary | ICD-10-CM | POA: Diagnosis not present

## 2021-02-27 DIAGNOSIS — G9349 Other encephalopathy: Secondary | ICD-10-CM | POA: Diagnosis not present

## 2021-02-27 DIAGNOSIS — I6300 Cerebral infarction due to thrombosis of unspecified precerebral artery: Secondary | ICD-10-CM | POA: Diagnosis not present

## 2021-02-27 DIAGNOSIS — H5340 Unspecified visual field defects: Secondary | ICD-10-CM | POA: Diagnosis present

## 2021-02-27 DIAGNOSIS — I69393 Ataxia following cerebral infarction: Secondary | ICD-10-CM | POA: Diagnosis not present

## 2021-02-27 DIAGNOSIS — I611 Nontraumatic intracerebral hemorrhage in hemisphere, cortical: Secondary | ICD-10-CM | POA: Diagnosis present

## 2021-02-27 DIAGNOSIS — Z808 Family history of malignant neoplasm of other organs or systems: Secondary | ICD-10-CM | POA: Diagnosis not present

## 2021-02-27 DIAGNOSIS — I6389 Other cerebral infarction: Secondary | ICD-10-CM | POA: Diagnosis not present

## 2021-02-27 DIAGNOSIS — I482 Chronic atrial fibrillation, unspecified: Secondary | ICD-10-CM

## 2021-02-27 DIAGNOSIS — Z85038 Personal history of other malignant neoplasm of large intestine: Secondary | ICD-10-CM

## 2021-02-27 DIAGNOSIS — R278 Other lack of coordination: Secondary | ICD-10-CM | POA: Diagnosis present

## 2021-02-27 DIAGNOSIS — I69398 Other sequelae of cerebral infarction: Secondary | ICD-10-CM | POA: Diagnosis not present

## 2021-02-27 DIAGNOSIS — Z9221 Personal history of antineoplastic chemotherapy: Secondary | ICD-10-CM | POA: Diagnosis not present

## 2021-02-27 DIAGNOSIS — R29713 NIHSS score 13: Secondary | ICD-10-CM | POA: Diagnosis not present

## 2021-02-27 DIAGNOSIS — E876 Hypokalemia: Secondary | ICD-10-CM

## 2021-02-27 DIAGNOSIS — R519 Headache, unspecified: Secondary | ICD-10-CM | POA: Diagnosis not present

## 2021-02-27 DIAGNOSIS — R110 Nausea: Secondary | ICD-10-CM | POA: Diagnosis present

## 2021-02-27 DIAGNOSIS — Z8546 Personal history of malignant neoplasm of prostate: Secondary | ICD-10-CM

## 2021-02-27 LAB — COMPREHENSIVE METABOLIC PANEL
ALT: 13 U/L (ref 0–44)
AST: 24 U/L (ref 15–41)
Albumin: 4.7 g/dL (ref 3.5–5.0)
Alkaline Phosphatase: 76 U/L (ref 38–126)
Anion gap: 13 (ref 5–15)
BUN: 12 mg/dL (ref 8–23)
CO2: 22 mmol/L (ref 22–32)
Calcium: 9.9 mg/dL (ref 8.9–10.3)
Chloride: 107 mmol/L (ref 98–111)
Creatinine, Ser: 1.63 mg/dL — ABNORMAL HIGH (ref 0.61–1.24)
GFR, Estimated: 43 mL/min — ABNORMAL LOW (ref >60)
Glucose, Bld: 97 mg/dL (ref 70–99)
Potassium: 3.2 mmol/L — ABNORMAL LOW (ref 3.5–5.1)
Sodium: 142 mmol/L (ref 135–145)
Total Bilirubin: 0.6 mg/dL (ref 0.3–1.2)
Total Protein: 8.4 g/dL — ABNORMAL HIGH (ref 6.5–8.1)

## 2021-02-27 LAB — RESP PANEL BY RT-PCR (FLU A&B, COVID) ARPGX2
Influenza A by PCR: NEGATIVE
Influenza B by PCR: NEGATIVE
SARS Coronavirus 2 by RT PCR: NEGATIVE

## 2021-02-27 LAB — CBC WITH DIFFERENTIAL/PLATELET
Abs Immature Granulocytes: 0.03 10*3/uL (ref 0.00–0.07)
Basophils Absolute: 0 10*3/uL (ref 0.0–0.1)
Basophils Relative: 0 %
Eosinophils Absolute: 0.2 10*3/uL (ref 0.0–0.5)
Eosinophils Relative: 2 %
HCT: 41.6 % (ref 39.0–52.0)
Hemoglobin: 13.4 g/dL (ref 13.0–17.0)
Immature Granulocytes: 0 %
Lymphocytes Relative: 18 %
Lymphs Abs: 1.9 10*3/uL (ref 0.7–4.0)
MCH: 28.9 pg (ref 26.0–34.0)
MCHC: 32.2 g/dL (ref 30.0–36.0)
MCV: 89.8 fL (ref 80.0–100.0)
Monocytes Absolute: 0.7 10*3/uL (ref 0.1–1.0)
Monocytes Relative: 7 %
Neutro Abs: 7.6 10*3/uL (ref 1.7–7.7)
Neutrophils Relative %: 73 %
Platelets: 294 10*3/uL (ref 150–400)
RBC: 4.63 MIL/uL (ref 4.22–5.81)
RDW: 14.2 % (ref 11.5–15.5)
WBC: 10.4 10*3/uL (ref 4.0–10.5)
nRBC: 0 % (ref 0.0–0.2)

## 2021-02-27 LAB — CBC
HCT: 43.8 % (ref 39.0–52.0)
Hemoglobin: 13.6 g/dL (ref 13.0–17.0)
MCH: 27.9 pg (ref 26.0–34.0)
MCHC: 31.1 g/dL (ref 30.0–36.0)
MCV: 89.9 fL (ref 80.0–100.0)
Platelets: 306 10*3/uL (ref 150–400)
RBC: 4.87 MIL/uL (ref 4.22–5.81)
RDW: 13.9 % (ref 11.5–15.5)
WBC: 10.2 10*3/uL (ref 4.0–10.5)
nRBC: 0 % (ref 0.0–0.2)

## 2021-02-27 LAB — TROPONIN I (HIGH SENSITIVITY)
Troponin I (High Sensitivity): 13 ng/L (ref <18)
Troponin I (High Sensitivity): 27 ng/L — ABNORMAL HIGH (ref <18)

## 2021-02-27 LAB — URINALYSIS, ROUTINE W REFLEX MICROSCOPIC
Bilirubin Urine: NEGATIVE
Glucose, UA: NEGATIVE mg/dL
Hgb urine dipstick: NEGATIVE
Ketones, ur: NEGATIVE mg/dL
Leukocytes,Ua: NEGATIVE
Nitrite: NEGATIVE
Protein, ur: NEGATIVE mg/dL
Specific Gravity, Urine: 1.005 (ref 1.005–1.030)
pH: 5.5 (ref 5.0–8.0)

## 2021-02-27 LAB — CREATININE, SERUM
Creatinine, Ser: 1.6 mg/dL — ABNORMAL HIGH (ref 0.61–1.24)
GFR, Estimated: 44 mL/min — ABNORMAL LOW (ref >60)

## 2021-02-27 LAB — CBG MONITORING, ED: Glucose-Capillary: 102 mg/dL — ABNORMAL HIGH (ref 70–99)

## 2021-02-27 LAB — RAPID URINE DRUG SCREEN, HOSP PERFORMED
Amphetamines: NOT DETECTED
Barbiturates: NOT DETECTED
Benzodiazepines: NOT DETECTED
Cocaine: NOT DETECTED
Opiates: NOT DETECTED
Tetrahydrocannabinol: NOT DETECTED

## 2021-02-27 LAB — PROTIME-INR
INR: 1 (ref 0.8–1.2)
Prothrombin Time: 13.1 seconds (ref 11.4–15.2)

## 2021-02-27 LAB — MAGNESIUM: Magnesium: 2.1 mg/dL (ref 1.7–2.4)

## 2021-02-27 MED ORDER — ASPIRIN EC 81 MG PO TBEC
81.0000 mg | DELAYED_RELEASE_TABLET | Freq: Every day | ORAL | Status: DC
  Administered 2021-02-28 – 2021-03-01 (×2): 81 mg via ORAL
  Filled 2021-02-27 (×2): qty 1

## 2021-02-27 MED ORDER — SODIUM CHLORIDE 0.9 % IV BOLUS
1000.0000 mL | Freq: Once | INTRAVENOUS | Status: AC
  Administered 2021-02-27: 1000 mL via INTRAVENOUS

## 2021-02-27 MED ORDER — POTASSIUM CHLORIDE 20 MEQ PO PACK
20.0000 meq | PACK | Freq: Once | ORAL | Status: AC
  Administered 2021-02-27: 20 meq via ORAL
  Filled 2021-02-27: qty 1

## 2021-02-27 MED ORDER — ASPIRIN EC 81 MG PO TBEC: 81.0000 mg | DELAYED_RELEASE_TABLET | Freq: Every day | ORAL | Status: DC

## 2021-02-27 MED ORDER — IOHEXOL 350 MG/ML SOLN
100.0000 mL | Freq: Once | INTRAVENOUS | Status: AC | PRN
  Administered 2021-02-27: 75 mL via INTRAVENOUS

## 2021-02-27 MED ORDER — ENOXAPARIN SODIUM 40 MG/0.4ML IJ SOSY
40.0000 mg | PREFILLED_SYRINGE | INTRAMUSCULAR | Status: DC
  Filled 2021-02-27: qty 0.4

## 2021-02-27 MED ORDER — POTASSIUM CHLORIDE 10 MEQ/100ML IV SOLN
10.0000 meq | INTRAVENOUS | Status: AC
  Administered 2021-02-27 (×2): 10 meq via INTRAVENOUS
  Filled 2021-02-27 (×2): qty 100

## 2021-02-27 MED ORDER — ATORVASTATIN CALCIUM 10 MG PO TABS: 20.0000 mg | ORAL_TABLET | Freq: Every day | ORAL | Status: DC

## 2021-02-27 MED ORDER — PREGABALIN 25 MG PO CAPS
50.0000 mg | ORAL_CAPSULE | Freq: Two times a day (BID) | ORAL | Status: DC
  Administered 2021-02-27 – 2021-03-01 (×4): 50 mg via ORAL
  Filled 2021-02-27 (×4): qty 2

## 2021-02-27 MED ORDER — METOPROLOL SUCCINATE ER 50 MG PO TB24
150.0000 mg | ORAL_TABLET | Freq: Every day | ORAL | Status: DC
  Administered 2021-02-27 – 2021-02-28 (×2): 150 mg via ORAL
  Filled 2021-02-27 (×2): qty 1

## 2021-02-27 MED ORDER — DILTIAZEM HCL-DEXTROSE 125-5 MG/125ML-% IV SOLN (PREMIX)
5.0000 mg/h | INTRAVENOUS | Status: DC
  Administered 2021-02-27: 5 mg/h via INTRAVENOUS
  Filled 2021-02-27: qty 125

## 2021-02-27 MED ORDER — POTASSIUM CHLORIDE 20 MEQ PO PACK
40.0000 meq | PACK | Freq: Once | ORAL | Status: DC
Start: 2021-02-27 — End: 2021-02-27

## 2021-02-27 MED ORDER — BUPROPION HCL ER (XL) 150 MG PO TB24
450.0000 mg | ORAL_TABLET | Freq: Every morning | ORAL | Status: DC
  Administered 2021-02-28 – 2021-03-05 (×6): 450 mg via ORAL
  Filled 2021-02-27 (×6): qty 3

## 2021-02-27 MED ORDER — CLEVIDIPINE BUTYRATE 0.5 MG/ML IV EMUL: 0.0000 mg/h | INTRAVENOUS | Status: DC

## 2021-02-27 MED ORDER — HYDROXYZINE HCL 10 MG PO TABS
10.0000 mg | ORAL_TABLET | Freq: Three times a day (TID) | ORAL | Status: DC | PRN
  Administered 2021-03-03 – 2021-03-04 (×4): 10 mg via ORAL
  Filled 2021-02-27 (×4): qty 1

## 2021-02-27 MED ORDER — STROKE: EARLY STAGES OF RECOVERY BOOK
Freq: Once | Status: AC
  Filled 2021-02-27: qty 1

## 2021-02-27 MED ORDER — LABETALOL HCL 5 MG/ML IV SOLN: 20.0000 mg | Freq: Once | INTRAVENOUS | Status: DC

## 2021-02-27 MED ORDER — ATORVASTATIN CALCIUM 80 MG PO TABS
80.0000 mg | ORAL_TABLET | Freq: Every day | ORAL | Status: DC
  Administered 2021-02-28 – 2021-03-05 (×6): 80 mg via ORAL
  Filled 2021-02-27 (×6): qty 1

## 2021-02-27 MED ORDER — ACETAMINOPHEN 160 MG/5ML PO SOLN: 650.0000 mg | ORAL | Status: DC | PRN

## 2021-02-27 MED ORDER — METOPROLOL TARTRATE 25 MG PO TABS
50.0000 mg | ORAL_TABLET | Freq: Once | ORAL | Status: AC
  Administered 2021-02-27: 50 mg via ORAL
  Filled 2021-02-27: qty 2

## 2021-02-27 MED ORDER — MAGNESIUM SULFATE IN D5W 1-5 GM/100ML-% IV SOLN
1.0000 g | Freq: Once | INTRAVENOUS | Status: AC
  Administered 2021-02-27: 1 g via INTRAVENOUS
  Filled 2021-02-27: qty 100

## 2021-02-27 MED ORDER — ATORVASTATIN CALCIUM 40 MG PO TABS
40.0000 mg | ORAL_TABLET | Freq: Every day | ORAL | Status: DC
  Filled 2021-02-27: qty 1

## 2021-02-27 MED ORDER — ACETAMINOPHEN 325 MG PO TABS
650.0000 mg | ORAL_TABLET | ORAL | Status: DC | PRN
  Administered 2021-02-28 – 2021-03-01 (×3): 650 mg via ORAL
  Filled 2021-02-27 (×3): qty 2

## 2021-02-27 MED ORDER — HYDROCODONE-ACETAMINOPHEN 5-325 MG PO TABS
1.0000 | ORAL_TABLET | Freq: Four times a day (QID) | ORAL | Status: DC | PRN
Start: 2021-02-27 — End: 2021-02-27

## 2021-02-27 MED ORDER — ASPIRIN 81 MG PO CHEW
81.0000 mg | CHEWABLE_TABLET | Freq: Once | ORAL | Status: AC
  Administered 2021-02-27: 81 mg via ORAL
  Filled 2021-02-27: qty 1

## 2021-02-27 MED ORDER — ACETAMINOPHEN 650 MG RE SUPP: 650.0000 mg | RECTAL | Status: DC | PRN

## 2021-02-27 MED ORDER — HYDROCODONE-ACETAMINOPHEN 5-325 MG PO TABS
1.0000 | ORAL_TABLET | Freq: Four times a day (QID) | ORAL | Status: DC | PRN
  Administered 2021-02-27 – 2021-03-03 (×5): 1 via ORAL
  Filled 2021-02-27 (×5): qty 1

## 2021-02-27 MED ORDER — VITAMIN D (ERGOCALCIFEROL) 1.25 MG (50000 UNIT) PO CAPS
50000.0000 [IU] | ORAL_CAPSULE | ORAL | Status: DC
  Administered 2021-02-27: 50000 [IU] via ORAL
  Filled 2021-02-27: qty 1

## 2021-02-27 NOTE — ED Notes (Signed)
Pt reports flu vaccine 1 week pta, symptoms worsening since vaccine

## 2021-02-27 NOTE — Consult Note (Signed)
NEURO HOSPITALIST CONSULT NOTE   Requestig physician: Dr. Earnest Conroy  Reason for Consult: Early subacute right PCA stroke  History obtained from:  Patient and Chart     HPI:                                                                                                                                          George [name]. is a 75 y.o. male with a PMHx of colon cancer s/p resection and chemotherapy, prostate cancer s/p TURP, chemotherapy induced peripheral neuropathy and HTN who presented to [facility] today with new onset of left sided weakness. He had been having a bad headache on Thursday night, then woke up at 0530 with the weakness. He had been feeling unwell generally since Monday after he had a flu shot that AM; however, there was no weakness or vision change until awakening today. CBG was 102 on arrival to the ED. Exam at [facility] revealed left sided weakness and a left lateral visual field cut. CT head was obtained, revealing a large right PCA territory ischemic infarction involving the right occipital lobe and right thalamus.   CT head with CTA of head and neck: 1. Subacute right PCA territorial infarct with probable small foci of petechial hemorrhage. 2. Possible focal occlusion of a P2 branch at the level of the ambient cistern, though the PCA is identified within the hypodense right occipital lobe. 3. Otherwise, patent vasculature of the head and neck with no significant atherosclerotic disease. 4. Small remote infarct in the right parietal lobe.  Past Medical History:  Diagnosis Date   Anxiety    Arthritis    OSTEO IN KNEE   Asthma    as child   Colon cancer ([place])    Depression    Elevated prostate specific antigen (PSA)    Generalized anxiety disorder 04/09/2015   Hematospermia    Hx antineoplastic chemotherapy 2010   Hypertension    Idiopathic peripheral neuropathy    TOES OF BOTH FEET DUE TO CHEMO   Incomplete bladder emptying     Malignant neoplasm of prostate ([place])    Nodular prostate with lower urinary tract symptoms    Panic attacks 04/09/2015   Primary localized osteoarthrosis of the knee, right 05/19/2016   Prostatitis    S/P total knee arthroplasty, left 05/19/2016   Spermatocele    Weak urinary stream     Past Surgical History:  Procedure Laterality Date   COLON RESECTION   NOV 2009   COLONOSCOPY     PROSTATE BIOPSY     SPERMATOCELECTOMY Left 05/07/2014   Procedure: LEFT SPERMATOCELECTOMY;  Surgeon: Malka So, MD;  Location: [facility];  Service: Urology;  Laterality: Left;   TONSILLECTOMY     TOTAL KNEE ARTHROPLASTY Left 04/21/2015  Procedure: LEFT TOTAL KNEE ARTHROPLASTY;  Surgeon: Elsie Saas, MD;  Location: [facility];  Service: Orthopedics;  Laterality: Left;   TOTAL KNEE ARTHROPLASTY Right 05/31/2016   Procedure: TOTAL KNEE ARTHROPLASTY;  Surgeon: Elsie Saas, MD;  Location: [facility];  Service: Orthopedics;  Laterality: Right;   TRANSURETHRAL RESECTION OF PROSTATE N/A 05/07/2014   Procedure: TRANSURETHRAL RESECTION OF THE PROSTATE WITH GYRUS INSTRUMENTS;  Surgeon: Malka So, MD;  Location: [facility];  Service: Urology;  Laterality: N/A;    Family History  Problem Relation Age of Onset   Stroke Father    Cancer Brother        neoplasm of brain            Social History:  reports that he quit smoking about 48 years ago. His smoking use included cigarettes. He has a 0.75 pack-year smoking history. He has never used smokeless tobacco. He reports current alcohol use of about 1.0 standard drink per week. He reports that he does not use drugs.  Allergies  Allergen Reactions   Amlodipine Other (See Comments)    Dizziness    Gabapentin     hallucinations    MEDICATIONS:                                                                                                                     Prior to Admission:  Medications Prior to Admission  Medication Sig Dispense Refill Last Dose   atorvastatin (LIPITOR) 20  MG tablet Take 20 mg by mouth daily.      buPROPion (WELLBUTRIN XL) 150 MG 24 hr tablet Take 450 mg by mouth every morning.       felodipine (PLENDIL) 10 MG 24 hr tablet Take 1 tablet by mouth daily.      hydrochlorothiazide (HYDRODIURIL) 25 MG tablet Take 25 mg by mouth daily.      losartan (COZAAR) 100 MG tablet Take 100 mg by mouth daily.      metoprolol succinate (TOPROL-XL) 100 MG 24 hr tablet Take 150 mg by mouth daily. Take with or immediately following a meal.       metoprolol succinate (TOPROL-XL) 50 MG 24 hr tablet Take 50 mg by mouth daily. Take with or immediately following a meal.      potassium chloride SA (K-DUR,KLOR-CON) 20 MEQ tablet Take 40 mEq by mouth daily.       pregabalin (LYRICA) 50 MG capsule Take 1 capsule (50 mg total) by mouth 2 (two) times daily. 60 capsule 4    spironolactone (ALDACTONE) 25 MG tablet Take 25 mg by mouth daily.      Vitamin D, Ergocalciferol, (DRISDOL) 50000 units CAPS capsule Take 50,000 Units by mouth every 7 (seven) days.      Scheduled:  [START ON 02/28/2021] aspirin EC  81 mg Oral Daily   [START ON 02/28/2021] atorvastatin  80 mg Oral Daily   [START ON 02/28/2021] buPROPion  450 mg Oral q morning   metoprolol succinate  150 mg Oral Daily  pregabalin  50 mg Oral BID   Vitamin D (Ergocalciferol)  50,000 Units Oral Q7 days   Continuous:  diltiazem (CARDIZEM) infusion 5 mg/hr (02/27/21 1946)   potassium chloride 10 mEq (02/27/21 2104)     ROS:                                                                                                                                       No facial droop. No aphasia. No confusion. Some trouble ambulating. Left sided incoordination. No right sided numbness or weakness. Right temporal headache. Has had persistent headache since earlier this week. No dysphagia. No urinary incontinence. No sensory loss. Other ROS as per HPI.    Blood pressure (!) 147/96, pulse (!) 115, temperature 99.5 F (37.5 C),  temperature source Oral, resp. rate 11, SpO2 96 %.   General Examination:                                                                                                       Physical Exam  HEENT-  [place]/AT    Lungs- Respirations unlabored Extremities- No edema  Neurological Examination Mental Status:  Alert, oriented to city, state, year and month but not the day of the week. Thought content appropriate.  Speech fluent with intact comprehension and naming. Able to follow all commands without difficulty. Cranial Nerves: II: Left homonymous hemianopsia. PERRL.    III,IV, VI: ptosis not present, extra-ocular motions intact bilaterally pupils equal, round, reactive to light and accommodation V,VII: smile symmetric, facial light touch sensation normal bilaterally VIII: hearing normal bilaterally IX,X: uvula rises symmetrically XI: bilateral shoulder shrug XII: midline tongue extension Motor: Right : Upper extremity   5/5    Left:     Upper extremity   5/5  Lower extremity   5/5     Lower extremity   5/5 Tone and bulk:normal tone throughout; no atrophy noted Sensory: Pinprick and light touch intact throughout, bilaterally Deep Tendon Reflexes: 2+ and symmetric throughout Plantars: Right: downgoing   Left: downgoing Cerebellar: normal finger-to-nose, normal rapid alternating movements and normal heel-to-shin test Gait: normal gait and station   Lab Results: Basic Metabolic Panel: Recent Labs  Lab 02/27/21 0937  NA 142  K 3.2*  CL 107  CO2 22  GLUCOSE 97  BUN 12  CREATININE 1.63*  CALCIUM 9.9  MG 2.1    CBC: Recent Labs  Lab 02/27/21 0937  WBC 10.4  NEUTROABS 7.6  HGB 13.4  HCT 41.6  MCV 89.8  PLT 294    Cardiac Enzymes: No results for input(s): CKTOTAL, CKMB, CKMBINDEX, TROPONINI in the last 168 hours.  Lipid Panel: No results for input(s): CHOL, TRIG, HDL, CHOLHDL, VLDL, LDLCALC in the last 168 hours.  Imaging: CT ANGIO HEAD NECK W WO CM  Result Date:  02/27/2021 CLINICAL DATA:  Weakness on left side, headache EXAM: CT ANGIOGRAPHY HEAD AND NECK TECHNIQUE: Multidetector CT imaging of the head and neck was performed using the standard protocol during bolus administration of intravenous contrast. Multiplanar CT image reconstructions and MIPs were obtained to evaluate the vascular anatomy. Carotid stenosis measurements (when applicable) are obtained utilizing NASCET criteria, using the distal internal carotid diameter as the denominator. CONTRAST:  54mL OMNIPAQUE IOHEXOL 350 MG/ML SOLN COMPARISON:  CT head 02/17/2009 FINDINGS: CT HEAD FINDINGS Brain: There is confluent hypodensity throughout the right PCA distribution including the thalamus consistent with subacute infarct. There are small foci of hyperdensity within the infarct territory suspicious for petechial hemorrhage. There is no other evidence of infarct or acute hemorrhage. There is no acute extra-axial fluid collection. There is mild parenchymal volume loss. The ventricles are not enlarged. There is a remote infarct a remote infarct in the right superior parietal lobule. There is no solid mass lesion.  There is no midline shift. Vascular: See below. Skull: Normal. Negative for fracture or focal lesion. Sinuses: The imaged paranasal sinuses are clear. Orbits: Metallic density anterior to the right globe is unchanged since 2010. The globes and orbits are otherwise unremarkable. Review of the MIP images confirms the above findings CTA NECK FINDINGS Aortic arch: Standard branching. Imaged portion shows no evidence of aneurysm or dissection. No significant stenosis of the major arch vessel origins. Right carotid system: The right common, internal, and external carotid arteries are patent, without hemodynamically significant stenosis, occlusion, dissection, or aneurysm. Left carotid system: The left common, internal, and external carotid arteries are patent, without hemodynamically significant stenosis,  occlusion, dissection, or aneurysm. Vertebral arteries: The vertebral arteries are patent, without hemodynamically significant stenosis, occlusion, dissection, or aneurysm. Skeleton: There is multilevel degenerative change of the cervical spine, most advanced at C7-T1. There is no acute osseous abnormality or aggressive osseous lesion. Other neck: The soft tissues of the neck are unremarkable. Upper chest: The imaged lung apices are clear. Review of the MIP images confirms the above findings CTA HEAD FINDINGS Anterior circulation: The intracranial internal carotid arteries are patent. The bilateral ACAs and MCAs are patent. There is no aneurysm. Posterior circulation: The bilateral V4 segments are patent. The basilar artery is patent. The right P1 and proximal P2 segments are patent. There is possible focal occlusion of a P2 branch at the level of the ambient cistern, though evaluation is made difficult by adjacent enhancing venous structures. The PCA is identified within the occipital lobe hypodensity (15-92). The left PCA is patent. The posterior communicating arteries are not identified. Venous sinuses: Patent. Anatomic variants: As above. Review of the MIP images confirms the above findings IMPRESSION: 1. Subacute right PCA territorial infarct with probable small foci of petechial hemorrhage. 2. Possible focal occlusion of a P2 branch at the level of the ambient cistern, though the PCA is identified within the hypodense right occipital lobe. 3. Otherwise, patent vasculature of the head and neck with no significant atherosclerotic disease. 4. Small remote infarct in the right parietal lobe. These results were called by telephone at the time of interpretation on 02/27/2021 at 12:00 pm to provider Wynona Dove , who verbally acknowledged  these results. Electronically Signed   By: Valetta Mole M.D.   On: 02/27/2021 12:05   DG Chest Port 1 View  Result Date: 02/27/2021 CLINICAL DATA:  Stroke symptoms EXAM:  PORTABLE CHEST 1 VIEW COMPARISON:  Chest radiograph 04/16/2019 FINDINGS: The heart is at the upper limits of normal for size, exaggerated by AP technique. There is unchanged slight asymmetric elevation of the right hemidiaphragm. There is no focal consolidation or pulmonary edema. There is no pleural effusion or pneumothorax. There is no acute osseous abnormality. IMPRESSION: No radiographic evidence of acute cardiopulmonary process. Electronically Signed   By: Valetta Mole M.D.   On: 02/27/2021 10:32     Assessment: 75 year old male with subacute right PCA territory ischemic infarction 1. Exam reveals left homonymous hemianopsia and left hemiataxia. 2. CT head: Subacute right PCA territorial infarct with probable small foci of petechial hemorrhage. Small remote infarct in the right parietal lobe. 3. CTA of head and neck: Possible focal occlusion of a P2 branch at the level of the ambient cistern, though the PCA is identified within the hypodense right occipital lobe. Otherwise, patent vasculature of the head and neck with no significant atherosclerotic disease.   4. Stroke risk factors: Cancer history and HTN   Recommendations: 1. HgbA1c, fasting lipid panel 2. MRI of the brain without contrast 3. PT consult, OT consult, Speech consult 4. Echocardiogram 5. Atorvastatin 6. Agree with starting ASA 7. Risk factor modification 8. Telemetry monitoring 9. Frequent neuro checks 10 NPO until passes stroke swallow screen   Electronically signed: Dr. Kerney Elbe 02/27/2021, 7:14 PM

## 2021-02-27 NOTE — ED Triage Notes (Signed)
Pt arrives op[facility]  with driver, edp at bedside, Pt endorses weakness today per wife. Weakness noted on left side. Wife endorses "pt c/o bad HA last night", weakness when waking at 0530 today

## 2021-02-27 NOTE — H&P (Signed)
History and Physical    DOA: 02/27/2021  PCP: Charolette Forward, MD  Patient coming from: home  Chief Complaint: left sided weakness  HPI: George [name]. is a 75 y.o. male with history h/o colon cancer s/p resection, prostate cancer, anxiety/depression, osteoarthritis of bilateral knees who lives at home with wife presented to [facility] ED with complaints of persistent headaches and left-sided weakness since early this week.  He reports getting a flu shot on Monday morning (11/14) and later in the day started experiencing severe frontal and bitemporal headaches (right greater than left), 10/10 associated with coordination difficulties that were noted by wife later in the evening.  He states his left side seem to have "trouble coordinating" and he attributed his symptoms to the flu shot he had received earlier in the day.  Next day he was noted to have difficulty ambulating and with coordination primarily due to left-sided weakness but he did not want to come to the hospital.  He states his nephew convinced him to come to the hospital this morning due to persistent headaches and left-sided weakness not getting better. ED work-up: Patient noted to have left eye visual defects, hemianopsia as well as left hemiparesis.  Stroke work-up was pursued and CT/CTA head and neck revealed subacute right PCA territorial infarct with probable small foci of petechial hemorrhage. Small remote infarct in the right parietal lobe.Possible focal occlusion of a P2 branch at the level of the ambient cistern, though the PCA is identified within the hypodense right occipital lobe.  Case was discussed by EDP with stroke neurology who recommended admission to medicine and okay to give baby aspirin with SBP goal <170. Patient was accepted to telemetry bed at [facility].  While awaiting transfer patient went into new onset A. fib with RVR-heart rate 150s to 180s.  He takes metoprolol at home, this was resumed and heart rate improved to  110s. On arrival here, patient still seems to be in A. fib with heart rate 107-135.  He is noted to have right gaze preference and still complaining of right temporal headache 5/10.  He denies noticing any vision changes at home although reports trouble balancing himself and partly attributes to possible vision changes.  Denies any trouble swallowing or family members noting any facial droop or speech changes.  Denies any sensory loss or urinary incontinence.  Denies smoking or drug use but does drink alcohol about 3 times a week-mostly rum/wine.  Never had any problems with alcohol withdrawal and can go for months without drinking.    Review of Systems: As per HPI, otherwise review of systems negative.    Past Medical History:  Diagnosis Date   Anxiety    Arthritis    OSTEO IN KNEE   Asthma    as child   Colon cancer ([place])    Depression    Elevated prostate specific antigen (PSA)    Generalized anxiety disorder 04/09/2015   Hematospermia    Hx antineoplastic chemotherapy 2010   Hypertension    Idiopathic peripheral neuropathy    TOES OF BOTH FEET DUE TO CHEMO   Incomplete bladder emptying    Malignant neoplasm of prostate ([place])    Nodular prostate with lower urinary tract symptoms    Panic attacks 04/09/2015   Primary localized osteoarthrosis of the knee, right 05/19/2016   Prostatitis    S/P total knee arthroplasty, left 05/19/2016   Spermatocele    Weak urinary stream     Past Surgical History:  Procedure Laterality  Date   COLON RESECTION   NOV 2009   COLONOSCOPY     PROSTATE BIOPSY     SPERMATOCELECTOMY Left 05/07/2014   Procedure: LEFT SPERMATOCELECTOMY;  Surgeon: Malka So, MD;  Location: [facility];  Service: Urology;  Laterality: Left;   TONSILLECTOMY     TOTAL KNEE ARTHROPLASTY Left 04/21/2015   Procedure: LEFT TOTAL KNEE ARTHROPLASTY;  Surgeon: Elsie Saas, MD;  Location: [facility];  Service: Orthopedics;  Laterality: Left;   TOTAL KNEE ARTHROPLASTY Right 05/31/2016    Procedure: TOTAL KNEE ARTHROPLASTY;  Surgeon: Elsie Saas, MD;  Location: [facility];  Service: Orthopedics;  Laterality: Right;   TRANSURETHRAL RESECTION OF PROSTATE N/A 05/07/2014   Procedure: TRANSURETHRAL RESECTION OF THE PROSTATE WITH GYRUS INSTRUMENTS;  Surgeon: Malka So, MD;  Location: [facility];  Service: Urology;  Laterality: N/A;    Social history:  reports that he quit smoking about 48 years ago. His smoking use included cigarettes. He has a 0.75 pack-year smoking history. He has never used smokeless tobacco. He reports current alcohol use of about 1.0 standard drink per week. He reports that he does not use drugs.   Allergies  Allergen Reactions   Amlodipine Other (See Comments)    Dizziness    Gabapentin     hallucinations    Family History  Problem Relation Age of Onset   Stroke Father    Cancer Brother        neoplasm of brain      Prior to Admission medications   Medication Sig Start Date End Date Taking? Authorizing Provider  atorvastatin (LIPITOR) 20 MG tablet Take 20 mg by mouth daily.    [provider]  buPROPion (WELLBUTRIN XL) 150 MG 24 hr tablet Take 450 mg by mouth every morning.     [provider]  felodipine (PLENDIL) 10 MG 24 hr tablet Take 1 tablet by mouth daily. 11/21/15   [provider]  hydrochlorothiazide (HYDRODIURIL) 25 MG tablet Take 25 mg by mouth daily.    [provider]  losartan (COZAAR) 100 MG tablet Take 100 mg by mouth daily.    [provider]  metoprolol succinate (TOPROL-XL) 100 MG 24 hr tablet Take 150 mg by mouth daily. Take with or immediately following a meal.     [provider]  metoprolol succinate (TOPROL-XL) 50 MG 24 hr tablet Take 50 mg by mouth daily. Take with or immediately following a meal.    [provider]  potassium chloride SA (K-DUR,KLOR-CON) 20 MEQ tablet Take 40 mEq by mouth daily.  09/01/14   [provider]  pregabalin (LYRICA) 50 MG capsule  Take 1 capsule (50 mg total) by mouth 2 (two) times daily. 01/31/18   Melvenia Beam, MD  spironolactone (ALDACTONE) 25 MG tablet Take 25 mg by mouth daily.    [provider]  Vitamin D, Ergocalciferol, (DRISDOL) 50000 units CAPS capsule Take 50,000 Units by mouth every 7 (seven) days.    [provider]    Physical Exam: Vitals:   02/27/21 1227 02/27/21 1423 02/27/21 1856 02/27/21 1949  BP: (!) 141/113 (!) 148/88 (!) 147/96 (!) 127/96  Pulse: (!) 124 (!) 115  (!) 116  Resp: (!) 22  11   Temp: 99 F (37.2 C)  99.5 F (37.5 C) 98.9 F (37.2 C)  TempSrc: Oral  Oral Oral  SpO2: 97%  96% 100%    Constitutional: Appears calm, comfortable, right gaze preference noted.  Able to participate in  history taking/physical examination, speech clear. Eyes: PERRL, lids and conjunctivae normal.  No ptosis ENMT: Mucous membranes are moist. Posterior pharynx clear of any exudate or lesions.Normal dentition.  No facial droop. Neck: normal, supple, no masses, no thyromegaly Respiratory: clear to auscultation bilaterally, no wheezing, no crackles. Normal respiratory effort. No accessory muscle use.  Cardiovascular: Regular rate and rhythm, no murmurs / rubs / gallops. No extremity edema. 2+ pedal pulses. No carotid bruits.  Abdomen: no tenderness, no masses palpated. No hepatosplenomegaly. Bowel sounds positive.  Musculoskeletal: no clubbing / cyanosis. No joint deformity upper and lower extremities. Good ROM, no contractures. Normal muscle tone.  Neurologic: CN 2-12 appear intact-could not appreciate facial droop. Sensation intact to crude touch bilaterally, DTR normal. Strength 4/5 in left upper and left lower extremity.  Positive pronator drift on the left.  Past-pointing on finger-to-nose test on left side. Psychiatric: Normal judgment and insight. Alert and oriented x 3. Normal mood.  SKIN/catheters: no rashes, lesions, ulcers. No induration.  Old surgical scars along bilateral knee  joints from TKA  Labs on Admission: I have personally reviewed following labs and imaging studies  CBC: Recent Labs  Lab 02/27/21 0937 02/27/21 1945  WBC 10.4 10.2  NEUTROABS 7.6  --   HGB 13.4 13.6  HCT 41.6 43.8  MCV 89.8 89.9  PLT 294 481   Basic Metabolic Panel: Recent Labs  Lab 02/27/21 0937  NA 142  K 3.2*  CL 107  CO2 22  GLUCOSE 97  BUN 12  CREATININE 1.63*  CALCIUM 9.9  MG 2.1   GFR: CrCl cannot be calculated (Unknown ideal weight.). Recent Labs  Lab 02/27/21 0937 02/27/21 1945  WBC 10.4 10.2   Liver Function Tests: Recent Labs  Lab 02/27/21 0937  AST 24  ALT 13  ALKPHOS 76  BILITOT 0.6  PROT 8.4*  ALBUMIN 4.7   No results for input(s): LIPASE, AMYLASE in the last 168 hours. No results for input(s): AMMONIA in the last 168 hours. Coagulation Profile: Recent Labs  Lab 02/27/21 0937  INR 1.0   Cardiac Enzymes: No results for input(s): CKTOTAL, CKMB, CKMBINDEX, TROPONINI in the last 168 hours. BNP (last 3 results) No results for input(s): PROBNP in the last 8760 hours. HbA1C: No results for input(s): HGBA1C in the last 72 hours. CBG: Recent Labs  Lab 02/27/21 0942  GLUCAP 102*   Lipid Profile: No results for input(s): CHOL, HDL, LDLCALC, TRIG, CHOLHDL, LDLDIRECT in the last 72 hours. Thyroid Function Tests: No results for input(s): TSH, T4TOTAL, FREET4, T3FREE, THYROIDAB in the last 72 hours. Anemia Panel: No results for input(s): VITAMINB12, FOLATE, FERRITIN, TIBC, IRON, RETICCTPCT in the last 72 hours. Urine analysis:    Component Value Date/Time   COLORURINE COLORLESS (A) 02/27/2021 1126   APPEARANCEUR CLEAR 02/27/2021 1126   LABSPEC 1.005 02/27/2021 1126   PHURINE 5.5 02/27/2021 [place] 02/27/2021 1126   [place] 02/27/2021 1126   [place] 02/27/2021 [place] 02/27/2021 1126   [place] 02/27/2021 1126   UROBILINOGEN 0.2 02/16/2009 1615   NITRITE NEGATIVE  02/27/2021 1126   LEUKOCYTESUR NEGATIVE 02/27/2021 1126    Radiological Exams on Admission: Personally reviewed  CT ANGIO HEAD NECK W WO CM  Result Date: 02/27/2021 CLINICAL DATA:  Weakness on left side, headache EXAM: CT ANGIOGRAPHY HEAD AND NECK TECHNIQUE: Multidetector CT imaging of the head and neck was performed using the standard protocol during bolus administration of intravenous contrast. Multiplanar CT image reconstructions  and MIPs were obtained to evaluate the vascular anatomy. Carotid stenosis measurements (when applicable) are obtained utilizing NASCET criteria, using the distal internal carotid diameter as the denominator. CONTRAST:  74mL OMNIPAQUE IOHEXOL 350 MG/ML SOLN COMPARISON:  CT head 02/17/2009 FINDINGS: CT HEAD FINDINGS Brain: There is confluent hypodensity throughout the right PCA distribution including the thalamus consistent with subacute infarct. There are small foci of hyperdensity within the infarct territory suspicious for petechial hemorrhage. There is no other evidence of infarct or acute hemorrhage. There is no acute extra-axial fluid collection. There is mild parenchymal volume loss. The ventricles are not enlarged. There is a remote infarct a remote infarct in the right superior parietal lobule. There is no solid mass lesion.  There is no midline shift. Vascular: See below. Skull: Normal. Negative for fracture or focal lesion. Sinuses: The imaged paranasal sinuses are clear. Orbits: Metallic density anterior to the right globe is unchanged since 2010. The globes and orbits are otherwise unremarkable. Review of the MIP images confirms the above findings CTA NECK FINDINGS Aortic arch: Standard branching. Imaged portion shows no evidence of aneurysm or dissection. No significant stenosis of the major arch vessel origins. Right carotid system: The right common, internal, and external carotid arteries are patent, without hemodynamically significant stenosis, occlusion,  dissection, or aneurysm. Left carotid system: The left common, internal, and external carotid arteries are patent, without hemodynamically significant stenosis, occlusion, dissection, or aneurysm. Vertebral arteries: The vertebral arteries are patent, without hemodynamically significant stenosis, occlusion, dissection, or aneurysm. Skeleton: There is multilevel degenerative change of the cervical spine, most advanced at C7-T1. There is no acute osseous abnormality or aggressive osseous lesion. Other neck: The soft tissues of the neck are unremarkable. Upper chest: The imaged lung apices are clear. Review of the MIP images confirms the above findings CTA HEAD FINDINGS Anterior circulation: The intracranial internal carotid arteries are patent. The bilateral ACAs and MCAs are patent. There is no aneurysm. Posterior circulation: The bilateral V4 segments are patent. The basilar artery is patent. The right P1 and proximal P2 segments are patent. There is possible focal occlusion of a P2 branch at the level of the ambient cistern, though evaluation is made difficult by adjacent enhancing venous structures. The PCA is identified within the occipital lobe hypodensity (15-92). The left PCA is patent. The posterior communicating arteries are not identified. Venous sinuses: Patent. Anatomic variants: As above. Review of the MIP images confirms the above findings IMPRESSION: 1. Subacute right PCA territorial infarct with probable small foci of petechial hemorrhage. 2. Possible focal occlusion of a P2 branch at the level of the ambient cistern, though the PCA is identified within the hypodense right occipital lobe. 3. Otherwise, patent vasculature of the head and neck with no significant atherosclerotic disease. 4. Small remote infarct in the right parietal lobe. These results were called by telephone at the time of interpretation on 02/27/2021 at 12:00 pm to provider Wynona Dove , who verbally acknowledged these results.  Electronically Signed   By: Valetta Mole M.D.   On: 02/27/2021 12:05   DG Chest Port 1 View  Result Date: 02/27/2021 CLINICAL DATA:  Stroke symptoms EXAM: PORTABLE CHEST 1 VIEW COMPARISON:  Chest radiograph 04/16/2019 FINDINGS: The heart is at the upper limits of normal for size, exaggerated by AP technique. There is unchanged slight asymmetric elevation of the right hemidiaphragm. There is no focal consolidation or pulmonary edema. There is no pleural effusion or pneumothorax. There is no acute osseous abnormality. IMPRESSION: No radiographic evidence  of acute cardiopulmonary process. Electronically Signed   By: Valetta Mole M.D.   On: 02/27/2021 10:32    EKG: Independently reviewed.  Atrial fibrillation with heart rate 110, nonspecific ST-T changes, QTc 528 ms     Assessment and Plan:   Principal Problem:   CVA (cerebral vascular accident) ([place]) Active Problems:   Atrial fibrillation with RVR (HCC)   Hypokalemia   Prolonged QT interval   Generalized anxiety disorder   Chemotherapy-induced neuropathy ([facility])   B12 deficiency    1.Acute PCA stroke with small petechial hemorrhages: Left-sided discoordination and visual changes consistent with CT findings although may have additional strokes given left hemiparesis and underlying A. fib.  Per neurology recommendations, continue 81 mg aspirin.  Will obtain MRI to rule out additional strokes given problem #2 and possibility of embolic phenomenon.  Systolic BP goal <093.  Current BP at goal.  Resume metoprolol given problem #2.  Hold other home meds.  Increase statin dose to high intensity-40 mg.  Patient to undergo bedside swallow evaluation and advance diet as tolerated.  CTA findings as above, will obtain echo.  PT/OT evaluations and lipid profile in AM.  Continue neurochecks, telemetry monitoring and requested nursing staff to inform neurology of patient's arrival.  2.  New onset atrial fibrillation: Patient's heart rate still uncontrolled,  resumed home dose metoprolol XL.  Will add Cardizem drip for additional rate control.  No anticoagulation per neurology recommendations given small petechial hemorrhages on CT.  Follow-up echo report.   3.  Headaches: In the setting of problem #1.  Analgesics as needed ordered.  4.  Prolonged QTC: Correct electrolytes, avoid QT prolonging agents and repeat EKG in a.m. magnesium IV x1.  Already on beta-blockers for problem #2.  5.  Hypokalemia: Replace to goal close to 4.0.  Check magnesium and replace as needed.  Noted to be on potassium supplements along with diuretics at home.  6.  CKD stage IIIb: Baseline creatinine appears to be around 1.6-1.8 with GFR 30-45.  Currently creatinine at baseline.  Hold home meds including ACE inhibitor/diuretics for permissive hypertension in the setting of problem #1.  Repeat labs in AM.  7.  History of colon and prostate cancer, chemo induced peripheral neuropathy: Resume Lyrica  8.  Anxiety/depression: Resume home meds  10.  Vitamin B12 deficiency: Resume home meds.  Recheck level  DVT prophylaxis: SCDs for now, Lovenox in a.m. if okay per neurology  COVID screen: Negative  Code Status: Full code as confirmed with patient.Health care proxy would be his wife [name]  Patient/Family Communication: Discussed with patient and all questions answered to satisfaction.  Consults called: Neurology Admission status :I certify that at the point of admission it is my clinical judgment that the patient will require inpatient hospital care spanning beyond 2 midnights from the point of admission due to high intensity of service and high frequency of surveillance required.Inpatient status is judged to be reasonable and necessary in order to provide the required intensity of service to ensure the patient's safety. The patient's presenting symptoms, physical exam findings, and initial radiographic and laboratory data in the context of their chronic comorbidities is felt to  place them at high risk for further clinical deterioration. The following factors support the patient status of inpatient : Acute ischemic stroke with focal deficits/visual defects and new onset A. fib with RVR     Guilford Shi MD [facility] Pager in [place]  If 7PM-7AM, please contact night-coverage [email or web address]   02/27/2021, 8:18 PM

## 2021-02-27 NOTE — ED Notes (Signed)
Pt's CBG result was 102. Informed [employer] manager.

## 2021-02-27 NOTE — Progress Notes (Signed)
75 y/o flu shot Monday morning, later in the evening noted some weakness but attributed to flu shot. Symptoms persistent over the last 5 days-presented to [facility] ED.  CTH-PCA infarct, petechial hemorrhage. PCA patent.  BP stable with SBP 140s. Amlodipine on hold. Left sided weakness , left lateral visual field cut.  EDP at [facility] d/w Dr Louretta Shorten for stroke w/u .  EDP to check with neuro and initiate atleast baby ASA/statins if okay per neuro (given petechial hemorrhage).  Accepted to telemetry bed

## 2021-02-27 NOTE — ED Provider Notes (Signed)
[facility] Provider Note   CSN: [id number] Arrival date & time: 02/27/21  8099     History Chief Complaint  Patient presents with   Stroke Symptoms    George [name]. is a 75 y.o. male.  This is a 75 y.o. male with significant medical history as below, including asthma, prostate malignancy, panic attacks who presents to the ED with complaint of left sided weakness, blurry vision. Pt with onset of symptoms Monday evening after having flu shot, feels symptoms have progressively worsened since onset, did not remit at any time between Monday and presentation this morning. Left arm and left leg weakness, no numbness or tingling. Difficulty walking that has been progressive. No back pain or urinary complaints/incontinence or overflow. He had mild HA last night which has improved. No fevers or chills. No recent medication or dietary changes. No illicit drug use, no recent ETOH. He has not felt this in the past.   The history is provided by the patient. No language interpreter was used.      Past Medical History:  Diagnosis Date   Anxiety    Arthritis    OSTEO IN KNEE   Asthma    as child   Colon cancer ([place])    Depression    Elevated prostate specific antigen (PSA)    Generalized anxiety disorder 04/09/2015   Hematospermia    Hx antineoplastic chemotherapy 2010   Hypertension    Idiopathic peripheral neuropathy    TOES OF BOTH FEET DUE TO CHEMO   Incomplete bladder emptying    Malignant neoplasm of prostate ([place])    Nodular prostate with lower urinary tract symptoms    Panic attacks 04/09/2015   Primary localized osteoarthrosis of the knee, right 05/19/2016   Prostatitis    S/P total knee arthroplasty, left 05/19/2016   Spermatocele    Weak urinary stream     Patient Active Problem List   Diagnosis Date Noted   CVA (cerebral vascular accident) ([place]) 02/27/2021   Polyneuropathy 01/31/2018   Chemotherapy-induced neuropathy ([place]) 01/31/2018    B12 deficiency 01/31/2018   Prediabetes 01/31/2018   Basal ganglia infarction ([place]) 01/31/2018   SBO (small bowel obstruction) ([place]) 10/20/2017   Primary localized osteoarthritis of right knee 05/31/2016   Primary localized osteoarthrosis of the knee, right 05/19/2016   S/P total knee arthroplasty, left 05/19/2016   Dyspnea 01/20/2016   DJD (degenerative joint disease) of knee 04/21/2015   Primary localized osteoarthritis of left knee 04/09/2015   Generalized anxiety disorder 04/09/2015   Panic attacks 04/09/2015   Spermatocele 05/08/2014   Benign localized hyperplasia of prostate with urinary obstruction 05/07/2014   Malignant neoplasm of prostate ([place]) 04/22/2014    Past Surgical History:  Procedure Laterality Date   COLON RESECTION   NOV 2009   COLONOSCOPY     PROSTATE BIOPSY     SPERMATOCELECTOMY Left 05/07/2014   Procedure: LEFT SPERMATOCELECTOMY;  Surgeon: Malka So, MD;  Location: [facility];  Service: Urology;  Laterality: Left;   TONSILLECTOMY     TOTAL KNEE ARTHROPLASTY Left 04/21/2015   Procedure: LEFT TOTAL KNEE ARTHROPLASTY;  Surgeon: Elsie Saas, MD;  Location: [facility];  Service: Orthopedics;  Laterality: Left;   TOTAL KNEE ARTHROPLASTY Right 05/31/2016   Procedure: TOTAL KNEE ARTHROPLASTY;  Surgeon: Elsie Saas, MD;  Location: [facility];  Service: Orthopedics;  Laterality: Right;   TRANSURETHRAL RESECTION OF PROSTATE N/A 05/07/2014   Procedure: TRANSURETHRAL RESECTION OF THE PROSTATE WITH GYRUS INSTRUMENTS;  Surgeon: Jenny Reichmann  Keene Breath, MD;  Location: [facility];  Service: Urology;  Laterality: N/A;       Family History  Problem Relation Age of Onset   Stroke Father    Cancer Brother        neoplasm of brain    Social History   Tobacco Use   Smoking status: Former    Packs/day: 0.25    Years: 3.00    Pack years: 0.75    Types: Cigarettes    Quit date: 04/12/1972    Years since quitting: 48.9   Smokeless tobacco: Never   Tobacco comments:    smoked 1 cigarette per day  SOME DAYS  Vaping Use   Vaping Use: Never used  Substance Use Topics   Alcohol use: Yes    Alcohol/week: 1.0 standard drink    Types: 1 Cans of beer per week    Comment: OCCASIONAL   Drug use: No    Home Medications Prior to Admission medications   Medication Sig Start Date End Date Taking? Authorizing Provider  atorvastatin (LIPITOR) 20 MG tablet Take 20 mg by mouth daily.    [provider]  buPROPion (WELLBUTRIN XL) 150 MG 24 hr tablet Take 450 mg by mouth every morning.     [provider]  felodipine (PLENDIL) 10 MG 24 hr tablet Take 1 tablet by mouth daily. 11/21/15   [provider]  hydrochlorothiazide (HYDRODIURIL) 25 MG tablet Take 25 mg by mouth daily.    [provider]  losartan (COZAAR) 100 MG tablet Take 100 mg by mouth daily.    [provider]  metoprolol succinate (TOPROL-XL) 100 MG 24 hr tablet Take 150 mg by mouth daily. Take with or immediately following a meal.     [provider]  metoprolol succinate (TOPROL-XL) 50 MG 24 hr tablet Take 50 mg by mouth daily. Take with or immediately following a meal.    [provider]  potassium chloride SA (K-DUR,KLOR-CON) 20 MEQ tablet Take 40 mEq by mouth daily.  09/01/14   [provider]  pregabalin (LYRICA) 50 MG capsule Take 1 capsule (50 mg total) by mouth 2 (two) times daily. 01/31/18   Melvenia Beam, MD  spironolactone (ALDACTONE) 25 MG tablet Take 25 mg by mouth daily.    [provider]  Vitamin D, Ergocalciferol, (DRISDOL) 50000 units CAPS capsule Take 50,000 Units by mouth every 7 (seven) days.    [provider]    Allergies    Amlodipine and Gabapentin  Review of Systems   Review of Systems  Constitutional:  Negative for chills and fever.  HENT:  Negative for facial swelling and trouble swallowing.   Eyes:  Negative for photophobia and visual disturbance.  Respiratory:  Negative for cough and shortness of breath.    Cardiovascular:  Negative for chest pain and palpitations.  Gastrointestinal:  Negative for abdominal pain, nausea and vomiting.  Endocrine: Negative for polydipsia and polyuria.  Genitourinary:  Negative for difficulty urinating and hematuria.  Musculoskeletal:  Positive for gait problem. Negative for joint swelling.  Skin:  Negative for pallor and rash.  Neurological:  Positive for weakness and headaches. Negative for syncope.  Psychiatric/Behavioral:  Negative for agitation and confusion.    Physical Exam Updated Vital Signs BP (!) 127/96 (BP Location: Right Arm)   Pulse (!) 116   Temp 98.9 F (37.2 C) (Oral)   Resp 11   SpO2 100%   Physical Exam Vitals and nursing note reviewed.  Constitutional:  General: He is not in acute distress.    Appearance: He is well-developed.  HENT:     Head: Normocephalic and atraumatic.     Right Ear: External ear normal.     Left Ear: External ear normal.     Mouth/Throat:     Mouth: Mucous membranes are moist.  Eyes:     General: No scleral icterus.    Extraocular Movements: Extraocular movements intact.     Pupils: Pupils are equal, round, and reactive to light.  Cardiovascular:     Rate and Rhythm: Normal rate and regular rhythm.     Pulses: Normal pulses.     Heart sounds: Normal heart sounds.  Pulmonary:     Effort: Pulmonary effort is normal. No respiratory distress.     Breath sounds: Normal breath sounds.  Abdominal:     General: Abdomen is flat.     Palpations: Abdomen is soft.     Tenderness: There is no abdominal tenderness.  Musculoskeletal:        General: Normal range of motion.     Cervical back: Full passive range of motion without pain and normal range of motion.     Right lower leg: No edema.     Left lower leg: No edema.  Skin:    General: Skin is warm and dry.     Capillary Refill: Capillary refill takes less than 2 seconds.  Neurological:     Mental Status: He is alert and oriented to person, place, and  time.     GCS: GCS eye subscore is 4. GCS verbal subscore is 5. GCS motor subscore is 6.     Cranial Nerves: Cranial nerves 2-12 are intact.     Sensory: Sensation is intact.     Motor: Weakness present.     Coordination: Finger-Nose-Finger Test abnormal.     Comments: Strength 3/5 LUE and LLE, strength 5/5 RUE and RLE. Ataxia with finger to nose testing to LUE. He has diminished vision to left only with his left eye in the lateral fields (upper and low)   Psychiatric:        Mood and Affect: Mood normal.        Behavior: Behavior normal.    ED Results / Procedures / Treatments   Labs (all labs ordered are listed, but only abnormal results are displayed) Labs Reviewed  COMPREHENSIVE METABOLIC PANEL - Abnormal; Notable for the following components:      Result Value   Potassium 3.2 (*)    Creatinine, Ser 1.63 (*)    Total Protein 8.4 (*)    GFR, Estimated 43 (*)    All other components within normal limits  URINALYSIS, ROUTINE W REFLEX MICROSCOPIC - Abnormal; Notable for the following components:   Color, Urine COLORLESS (*)    All other components within normal limits  CBG MONITORING, ED - Abnormal; Notable for the following components:   Glucose-Capillary 102 (*)    All other components within normal limits  RESP PANEL BY RT-PCR (FLU A&B, COVID) ARPGX2  MAGNESIUM  CBC WITH DIFFERENTIAL/PLATELET  PROTIME-INR  RAPID URINE DRUG SCREEN, HOSP PERFORMED  HEMOGLOBIN A1C  LIPID PANEL  CBC  CREATININE, SERUM  TROPONIN I (HIGH SENSITIVITY)  TROPONIN I (HIGH SENSITIVITY)    EKG EKG Interpretation  Date/Time:  Friday February 27 2021 10:22:09 EST Ventricular Rate:  114 PR Interval:    QRS Duration: 108 QT Interval:  385 QTC Calculation: 528 R Axis:   -16 Text Interpretation: Atrial fibrillation  Minimal ST depression, anterior leads Prolonged QT interval Confirmed by Wynona Dove ([id number]) on 02/27/2021 12:17:24 PM  Radiology CT ANGIO HEAD NECK W WO CM  Result Date:  02/27/2021 CLINICAL DATA:  Weakness on left side, headache EXAM: CT ANGIOGRAPHY HEAD AND NECK TECHNIQUE: Multidetector CT imaging of the head and neck was performed using the standard protocol during bolus administration of intravenous contrast. Multiplanar CT image reconstructions and MIPs were obtained to evaluate the vascular anatomy. Carotid stenosis measurements (when applicable) are obtained utilizing NASCET criteria, using the distal internal carotid diameter as the denominator. CONTRAST:  56mL OMNIPAQUE IOHEXOL 350 MG/ML SOLN COMPARISON:  CT head 02/17/2009 FINDINGS: CT HEAD FINDINGS Brain: There is confluent hypodensity throughout the right PCA distribution including the thalamus consistent with subacute infarct. There are small foci of hyperdensity within the infarct territory suspicious for petechial hemorrhage. There is no other evidence of infarct or acute hemorrhage. There is no acute extra-axial fluid collection. There is mild parenchymal volume loss. The ventricles are not enlarged. There is a remote infarct a remote infarct in the right superior parietal lobule. There is no solid mass lesion.  There is no midline shift. Vascular: See below. Skull: Normal. Negative for fracture or focal lesion. Sinuses: The imaged paranasal sinuses are clear. Orbits: Metallic density anterior to the right globe is unchanged since 2010. The globes and orbits are otherwise unremarkable. Review of the MIP images confirms the above findings CTA NECK FINDINGS Aortic arch: Standard branching. Imaged portion shows no evidence of aneurysm or dissection. No significant stenosis of the major arch vessel origins. Right carotid system: The right common, internal, and external carotid arteries are patent, without hemodynamically significant stenosis, occlusion, dissection, or aneurysm. Left carotid system: The left common, internal, and external carotid arteries are patent, without hemodynamically significant stenosis,  occlusion, dissection, or aneurysm. Vertebral arteries: The vertebral arteries are patent, without hemodynamically significant stenosis, occlusion, dissection, or aneurysm. Skeleton: There is multilevel degenerative change of the cervical spine, most advanced at C7-T1. There is no acute osseous abnormality or aggressive osseous lesion. Other neck: The soft tissues of the neck are unremarkable. Upper chest: The imaged lung apices are clear. Review of the MIP images confirms the above findings CTA HEAD FINDINGS Anterior circulation: The intracranial internal carotid arteries are patent. The bilateral ACAs and MCAs are patent. There is no aneurysm. Posterior circulation: The bilateral V4 segments are patent. The basilar artery is patent. The right P1 and proximal P2 segments are patent. There is possible focal occlusion of a P2 branch at the level of the ambient cistern, though evaluation is made difficult by adjacent enhancing venous structures. The PCA is identified within the occipital lobe hypodensity (15-92). The left PCA is patent. The posterior communicating arteries are not identified. Venous sinuses: Patent. Anatomic variants: As above. Review of the MIP images confirms the above findings IMPRESSION: 1. Subacute right PCA territorial infarct with probable small foci of petechial hemorrhage. 2. Possible focal occlusion of a P2 branch at the level of the ambient cistern, though the PCA is identified within the hypodense right occipital lobe. 3. Otherwise, patent vasculature of the head and neck with no significant atherosclerotic disease. 4. Small remote infarct in the right parietal lobe. These results were called by telephone at the time of interpretation on 02/27/2021 at 12:00 pm to provider Wynona Dove , who verbally acknowledged these results. Electronically Signed   By: Valetta Mole M.D.   On: 02/27/2021 12:05   DG [facility]  Result Date: 02/27/2021 CLINICAL DATA:  Stroke symptoms EXAM:  PORTABLE CHEST 1 VIEW COMPARISON:  Chest radiograph 04/16/2019 FINDINGS: The heart is at the upper limits of normal for size, exaggerated by AP technique. There is unchanged slight asymmetric elevation of the right hemidiaphragm. There is no focal consolidation or pulmonary edema. There is no pleural effusion or pneumothorax. There is no acute osseous abnormality. IMPRESSION: No radiographic evidence of acute cardiopulmonary process. Electronically Signed   By: Valetta Mole M.D.   On: 02/27/2021 10:32    Procedures .Critical Care Performed by: Jeanell Sparrow, DO Authorized by: Jeanell Sparrow, DO   Critical care provider statement:    Critical care time (minutes):  36   Critical care time was exclusive of:  Separately billable procedures and treating other patients   Critical care was necessary to treat or prevent imminent or life-threatening deterioration of the following conditions:  CNS failure or compromise   Critical care was time spent personally by me on the following activities:  Ordering and performing treatments and interventions, ordering and review of laboratory studies, ordering and review of radiographic studies, pulse oximetry, re-evaluation of patient's condition, review of old charts, obtaining history from patient or surrogate, examination of patient, evaluation of patient's response to treatment and development of treatment plan with patient or surrogate   Care discussed with: admitting provider     Medications Ordered in ED Medications  metoprolol succinate (TOPROL-XL) 24 hr tablet 150 mg (has no administration in time range)  buPROPion (WELLBUTRIN XL) 24 hr tablet 450 mg (has no administration in time range)  pregabalin (LYRICA) capsule 50 mg (has no administration in time range)  Vitamin D (Ergocalciferol) (DRISDOL) capsule 50,000 Units (has no administration in time range)   stroke: mapping our early stages of recovery book (has no administration in time range)   acetaminophen (TYLENOL) tablet 650 mg (has no administration in time range)    Or  acetaminophen (TYLENOL) 160 MG/5ML solution 650 mg (has no administration in time range)    Or  acetaminophen (TYLENOL) suppository 650 mg (has no administration in time range)  enoxaparin (LOVENOX) injection 40 mg (has no administration in time range)  aspirin EC tablet 81 mg (has no administration in time range)  atorvastatin (LIPITOR) tablet 40 mg (has no administration in time range)  diltiazem (CARDIZEM) 125 mg in dextrose 5% 125 mL (1 mg/mL) infusion (5 mg/hr Intravenous New Bag/Given 02/27/21 1946)  sodium chloride 0.9 % bolus 1,000 mL ( Intravenous Stopped 02/27/21 1253)  iohexol (OMNIPAQUE) 350 MG/ML injection 100 mL (75 mLs Intravenous Contrast Given 02/27/21 1120)  metoprolol tartrate (LOPRESSOR) tablet 50 mg (50 mg Oral Given 02/27/21 1423)  aspirin chewable tablet 81 mg (81 mg Oral Given 02/27/21 1423)    ED Course  I have reviewed the triage vital signs and the nursing notes.  Pertinent labs & imaging results that were available during my care of the patient were reviewed by me and considered in my medical decision making (see chart for details).  Clinical Course as of 02/27/21 1956  Fri Feb 27, 2021  1211 IMPRESSION: 1. Subacute right PCA territorial infarct with probable small foci of petechial hemorrhage. 2. Possible focal occlusion of a P2 branch at the level of the ambient cistern, though the PCA is identified within the hypodense right occipital lobe. 3. Otherwise, patent vasculature of the head and neck with no significant atherosclerotic disease. 4. Small remote infarct in the right parietal lobe.     [SG]  Clinical Course User Index [SG] Jeanell Sparrow, DO   MDM Rules/Calculators/A&P                           CC: stroke like symptoms   This patient complains of above; this involves an extensive number of treatment options and is a complaint that carries with it a  high risk of complications and morbidity. Vital signs were reviewed. Serious etiologies considered.  Record review:   Previous records obtained and reviewed   Additional history obtained from wife  Work up as above, notable for:  Labs & imaging results that were available during my care of the patient were reviewed by me and considered in my medical decision making.   I ordered imaging studies which included ct angio head neck and I independently visualized and interpreted imaging which showed PCA infarct with petechial hemorrhage, PCA is patent per rads  Management: I went to re-assess the pt and HR was 150-180, afib on the monitor. He did not take lopressor this AM, will give this now. He does not have history of afib per chart review. If not converted with bb will give ccb if needed. Recommend cardiology evaluation given apparent new onset AFIB.   D/w Dr Leonel Ramsay, recommends admission/stroke w/u. Okay with ASA 81mg , No Anticoagulation for now, SBP goal <180. D/w Dr Earnest Conroy who accepts pt for admission.          This chart was dictated using voice recognition software.  Despite best efforts to proofread,  errors can occur which can change the documentation meaning.  Final Clinical Impression(s) / ED Diagnoses Final diagnoses:  Ischemic stroke ([place])  Intracranial hemorrhage ([place])  Atrial fibrillation with RVR [facility])    Rx / DC Orders ED Discharge Orders     None        Jeanell Sparrow, DO 02/27/21 1957

## 2021-02-28 ENCOUNTER — Inpatient Hospital Stay (HOSPITAL_COMMUNITY): Payer: Medicare Other

## 2021-02-28 DIAGNOSIS — I6300 Cerebral infarction due to thrombosis of unspecified precerebral artery: Secondary | ICD-10-CM | POA: Diagnosis not present

## 2021-02-28 DIAGNOSIS — I6389 Other cerebral infarction: Secondary | ICD-10-CM

## 2021-02-28 DIAGNOSIS — N179 Acute kidney failure, unspecified: Secondary | ICD-10-CM | POA: Diagnosis not present

## 2021-02-28 LAB — ECHOCARDIOGRAM COMPLETE
Height: 73 in
S' Lateral: 3 cm
Weight: 3146.41 oz

## 2021-02-28 LAB — HEMOGLOBIN A1C
Hgb A1c MFr Bld: 5.4 % (ref 4.8–5.6)
Mean Plasma Glucose: 108.28 mg/dL

## 2021-02-28 LAB — BASIC METABOLIC PANEL
Anion gap: 7 (ref 5–15)
BUN: 9 mg/dL (ref 8–23)
CO2: 23 mmol/L (ref 22–32)
Calcium: 8.8 mg/dL — ABNORMAL LOW (ref 8.9–10.3)
Chloride: 107 mmol/L (ref 98–111)
Creatinine, Ser: 1.6 mg/dL — ABNORMAL HIGH (ref 0.61–1.24)
GFR, Estimated: 44 mL/min — ABNORMAL LOW (ref >60)
Glucose, Bld: 119 mg/dL — ABNORMAL HIGH (ref 70–99)
Potassium: 3.2 mmol/L — ABNORMAL LOW (ref 3.5–5.1)
Sodium: 137 mmol/L (ref 135–145)

## 2021-02-28 LAB — VITAMIN B12: Vitamin B-12: 124 pg/mL — ABNORMAL LOW (ref 180–914)

## 2021-02-28 LAB — LIPID PANEL
Cholesterol: 119 mg/dL (ref 0–200)
HDL: 36 mg/dL — ABNORMAL LOW (ref >40)
LDL Cholesterol: 58 mg/dL (ref 0–99)
Total CHOL/HDL Ratio: 3.3 RATIO
Triglycerides: 126 mg/dL (ref <150)
VLDL: 25 mg/dL (ref 0–40)

## 2021-02-28 LAB — MAGNESIUM: Magnesium: 2.3 mg/dL (ref 1.7–2.4)

## 2021-02-28 MED ORDER — SODIUM CHLORIDE 0.9 % IV SOLN: INTRAVENOUS | Status: DC

## 2021-02-28 MED ORDER — VITAMIN B-12 1000 MCG PO TABS: 1000.0000 ug | ORAL_TABLET | Freq: Every day | ORAL | Status: DC

## 2021-02-28 MED ORDER — POTASSIUM CHLORIDE CRYS ER 20 MEQ PO TBCR
40.0000 meq | EXTENDED_RELEASE_TABLET | Freq: Once | ORAL | Status: AC
  Administered 2021-02-28: 40 meq via ORAL
  Filled 2021-02-28: qty 2

## 2021-02-28 MED ORDER — APIXABAN 5 MG PO TABS
5.0000 mg | ORAL_TABLET | Freq: Two times a day (BID) | ORAL | Status: DC
  Administered 2021-02-28 – 2021-03-05 (×11): 5 mg via ORAL
  Filled 2021-02-28 (×11): qty 1

## 2021-02-28 MED ORDER — CYANOCOBALAMIN 1000 MCG/ML IJ SOLN
1000.0000 ug | Freq: Every day | INTRAMUSCULAR | Status: DC
  Administered 2021-02-28 – 2021-03-05 (×6): 1000 ug via INTRAMUSCULAR
  Filled 2021-02-28 (×6): qty 1

## 2021-02-28 NOTE — Progress Notes (Signed)
PROGRESS NOTE    George [name].  FFM:[id number] DOB: [birth year] DOA: 02/27/2021 PCP: Charolette Forward, MD   Brief Narrative: 75 year old with past medical history significant for colon cancer status postresection, prostate cancer, anxiety/depression, osteoarthritis of  bilateral knees, who presented to the ED complaining of persistent legs and left-sided weakness since Thursday (11/17).  He has been feeling sick since Monday after he got the flu shot.   Evaluation in the ED patient was found to have left visual defect, hemianopsia not, left hemiparesis.  CT CTA head and neck revealed subacute right PCA territorial infarct with probably small foci of petechial hemorrhage.  Possible focal occlusion of P2 branch.  Patient subsequently developed A. fib with RVR.  He was started on his metoprolol and Cardizem drip. Neurology has been consulted.  Failure for the recommendation is anticoagulation.  Patient unable to get MRI due to following mental left eye.    Assessment & Plan:   Principal Problem:   CVA (cerebral vascular accident) ([place]) Active Problems:   Generalized anxiety disorder   Chemotherapy-induced neuropathy ([place])   B12 deficiency   Atrial fibrillation with RVR ([facility])   Hypokalemia   Prolonged QT interval  1-Acute PCA stroke with a small petechial hemorrhage: -Patient presented with left side discoordination and visual changes.  -CTA; :Subacute right PCA territorial infarct with probable small foci of petechial hemorrhage.  Possible focal occlusion of a P2 branch at the level of the ambient cistern, though the PCA is identified within the hypodense right occipital lobe. Small remote infarct in the right parietal lobe. -Patient also found to have A. fib with RVR. -Per Neurology  continue with aspirin. -Unable to proceed with MRI due to patient having foreign metal left eye. -Continue with the statins. -LDL:58 A1C:5.8 -PT, OT consulted.   2-New onset A. fib: Troponin not  significantly elevated. Metoprolol increase to 150 from home dose of 50 mg. I will hold Cardizem drip for now.  Heart rate in the 70s  Headache: Setting of #1: As needed Tylenol  Prolong QT: replete electrolytes.   Hypokalemia: Replete orally.   CKD stage IIIb: Getting baseline around 1.6--- 1.8. Stable, monitor renal function.   History of colon and prostate cancer, status posts induced peripheral neuropathy  B12 deficiency: B 12 low at 124. Will start supplement. IM.    Estimated body mass index is 25.94 kg/m as calculated from the following:   Height as of this encounter: 6\' 1"  (1.854 m).   Weight as of this encounter: 89.2 kg.   DVT prophylaxis: SCD Code Status: Full code Family Communication: care discussed with wife who was at bedside.  Disposition Plan:  Status is: Inpatient  Remains inpatient appropriate because: stroke, a fib RR        Consultants:  Neurology  Procedures:  ECHO pending.  Antimicrobials:    Subjective: He is alert, follows command, left side disc ordination. Left side hemianopsia.   Objective: Vitals:   02/27/21 2343 02/28/21 0143 02/28/21 0332 02/28/21 0530  BP: (!) 155/86 (!) 161/96 140/85 (!) 130/91  Pulse: 92 72 68 70  Resp: 20 17 15 17   Temp: 98.4 F (36.9 C) 97.9 F (36.6 C) 98.6 F (37 C) 98.3 F (36.8 C)  TempSrc: Oral Oral Oral Oral  SpO2: 98% 100% 98% 97%  Weight:      Height:        Intake/Output Summary (Last 24 hours) at 02/28/2021 0732 Last data filed at 02/28/2021 0422 Gross per 24 hour  Intake  1520.87 ml  Output --  Net 1520.87 ml   Filed Weights   02/27/21 1949  Weight: 89.2 kg    Examination:  General exam: Appears calm and comfortable  Respiratory system: Clear to auscultation. Respiratory effort normal. Cardiovascular system: S1 & S2 heard, RRR. No JVD, murmurs, rubs, gallops or clicks. No pedal edema. Gastrointestinal system: Abdomen is nondistended, soft and nontender. No organomegaly  or masses felt. Normal bowel sounds heard. Central nervous system: Alert and oriented. Moves all 4 extremities. Left upper and lower extremity loss of coordination.  Extremities: Symmetric 5 x 5 power.   Data Reviewed: I have personally reviewed following labs and imaging studies  CBC: Recent Labs  Lab 02/27/21 0937 02/27/21 1945  WBC 10.4 10.2  NEUTROABS 7.6  --   HGB 13.4 13.6  HCT 41.6 43.8  MCV 89.8 89.9  PLT 294 272   Basic Metabolic Panel: Recent Labs  Lab 02/27/21 0937 02/27/21 1945 02/28/21 0027  NA 142  --  137  K 3.2*  --  3.2*  CL 107  --  107  CO2 22  --  23  GLUCOSE 97  --  119*  BUN 12  --  9  CREATININE 1.63* 1.60* 1.60*  CALCIUM 9.9  --  8.8*  MG 2.1  --  2.3   GFR: Estimated Creatinine Clearance: 43.7 mL/min (A) (by C-G formula based on SCr of 1.6 mg/dL (H)). Liver Function Tests: Recent Labs  Lab 02/27/21 0937  AST 24  ALT 13  ALKPHOS 76  BILITOT 0.6  PROT 8.4*  ALBUMIN 4.7   No results for input(s): LIPASE, AMYLASE in the last 168 hours. No results for input(s): AMMONIA in the last 168 hours. Coagulation Profile: Recent Labs  Lab 02/27/21 0937  INR 1.0   Cardiac Enzymes: No results for input(s): CKTOTAL, CKMB, CKMBINDEX, TROPONINI in the last 168 hours. BNP (last 3 results) No results for input(s): PROBNP in the last 8760 hours. HbA1C: Recent Labs    02/28/21 0027  HGBA1C 5.4   CBG: Recent Labs  Lab 02/27/21 0942  GLUCAP 102*   Lipid Profile: Recent Labs    02/28/21 0027  CHOL 119  HDL 36*  LDLCALC 58  TRIG 126  CHOLHDL 3.3   Thyroid Function Tests: No results for input(s): TSH, T4TOTAL, FREET4, T3FREE, THYROIDAB in the last 72 hours. Anemia Panel: Recent Labs    02/28/21 0027  VITAMINB12 124*   Sepsis Labs: No results for input(s): PROCALCITON, LATICACIDVEN in the last 168 hours.  Recent Results (from the past 240 hour(s))  Resp Panel by RT-PCR (Flu A&B, Covid) Nasopharyngeal Swab     Status: None    Collection Time: 02/27/21  9:47 AM   Specimen: Nasopharyngeal Swab; Nasopharyngeal(NP) swabs in vial transport medium  Result Value Ref Range Status   SARS Coronavirus 2 by RT PCR NEGATIVE NEGATIVE Final    Comment: (NOTE) SARS-CoV-2 target nucleic acids are NOT DETECTED.  The SARS-CoV-2 RNA is generally detectable in upper respiratory specimens during the acute phase of infection. The lowest concentration of SARS-CoV-2 viral copies this assay can detect is 138 copies/mL. A negative result does not preclude SARS-Cov-2 infection and should not be used as the sole basis for treatment or other patient management decisions. A negative result may occur with  improper specimen collection/handling, submission of specimen other than nasopharyngeal swab, presence of viral mutation(s) within the areas targeted by this assay, and inadequate number of viral copies(<138 copies/mL). A negative result must be  combined with clinical observations, patient history, and epidemiological information. The expected result is Negative.  Fact Sheet for Patients:  EntrepreneurPulse.com.au  Fact Sheet for Healthcare Providers:  IncredibleEmployment.be  This test is no t yet approved or cleared by the [place] FDA and  has been authorized for detection and/or diagnosis of SARS-CoV-2 by FDA under an Emergency Use Authorization (EUA). This EUA will remain  in effect (meaning this test can be used) for the duration of the COVID-19 declaration under Section 564(b)(1) of the Act, 21 U.S.C.section 360bbb-3(b)(1), unless the authorization is terminated  or revoked sooner.       Influenza A by PCR NEGATIVE NEGATIVE Final   Influenza B by PCR NEGATIVE NEGATIVE Final    Comment: (NOTE) The Xpert Xpress SARS-CoV-2/FLU/RSV plus assay is intended as an aid in the diagnosis of influenza from Nasopharyngeal swab specimens and should not be used as a sole basis for treatment.  Nasal washings and aspirates are unacceptable for Xpert Xpress SARS-CoV-2/FLU/RSV testing.  Fact Sheet for Patients: EntrepreneurPulse.com.au  Fact Sheet for Healthcare Providers: IncredibleEmployment.be  This test is not yet approved or cleared by the [place] FDA and has been authorized for detection and/or diagnosis of SARS-CoV-2 by FDA under an Emergency Use Authorization (EUA). This EUA will remain in effect (meaning this test can be used) for the duration of the COVID-19 declaration under Section 564(b)(1) of the Act, 21 U.S.C. section 360bbb-3(b)(1), unless the authorization is terminated or revoked.  Performed at [facility], [address], [place], [place] [address]          Radiology Studies: CT ANGIO HEAD NECK W WO CM  Result Date: 02/27/2021 CLINICAL DATA:  Weakness on left side, headache EXAM: CT ANGIOGRAPHY HEAD AND NECK TECHNIQUE: Multidetector CT imaging of the head and neck was performed using the standard protocol during bolus administration of intravenous contrast. Multiplanar CT image reconstructions and MIPs were obtained to evaluate the vascular anatomy. Carotid stenosis measurements (when applicable) are obtained utilizing NASCET criteria, using the distal internal carotid diameter as the denominator. CONTRAST:  56mL OMNIPAQUE IOHEXOL 350 MG/ML SOLN COMPARISON:  CT head 02/17/2009 FINDINGS: CT HEAD FINDINGS Brain: There is confluent hypodensity throughout the right PCA distribution including the thalamus consistent with subacute infarct. There are small foci of hyperdensity within the infarct territory suspicious for petechial hemorrhage. There is no other evidence of infarct or acute hemorrhage. There is no acute extra-axial fluid collection. There is mild parenchymal volume loss. The ventricles are not enlarged. There is a remote infarct a remote infarct in the right superior parietal lobule. There  is no solid mass lesion.  There is no midline shift. Vascular: See below. Skull: Normal. Negative for fracture or focal lesion. Sinuses: The imaged paranasal sinuses are clear. Orbits: Metallic density anterior to the right globe is unchanged since 2010. The globes and orbits are otherwise unremarkable. Review of the MIP images confirms the above findings CTA NECK FINDINGS Aortic arch: Standard branching. Imaged portion shows no evidence of aneurysm or dissection. No significant stenosis of the major arch vessel origins. Right carotid system: The right common, internal, and external carotid arteries are patent, without hemodynamically significant stenosis, occlusion, dissection, or aneurysm. Left carotid system: The left common, internal, and external carotid arteries are patent, without hemodynamically significant stenosis, occlusion, dissection, or aneurysm. Vertebral arteries: The vertebral arteries are patent, without hemodynamically significant stenosis, occlusion, dissection, or aneurysm. Skeleton: There is multilevel degenerative change of the cervical spine, most advanced at C7-T1. There  is no acute osseous abnormality or aggressive osseous lesion. Other neck: The soft tissues of the neck are unremarkable. Upper chest: The imaged lung apices are clear. Review of the MIP images confirms the above findings CTA HEAD FINDINGS Anterior circulation: The intracranial internal carotid arteries are patent. The bilateral ACAs and MCAs are patent. There is no aneurysm. Posterior circulation: The bilateral V4 segments are patent. The basilar artery is patent. The right P1 and proximal P2 segments are patent. There is possible focal occlusion of a P2 branch at the level of the ambient cistern, though evaluation is made difficult by adjacent enhancing venous structures. The PCA is identified within the occipital lobe hypodensity (15-92). The left PCA is patent. The posterior communicating arteries are not identified.  Venous sinuses: Patent. Anatomic variants: As above. Review of the MIP images confirms the above findings IMPRESSION: 1. Subacute right PCA territorial infarct with probable small foci of petechial hemorrhage. 2. Possible focal occlusion of a P2 branch at the level of the ambient cistern, though the PCA is identified within the hypodense right occipital lobe. 3. Otherwise, patent vasculature of the head and neck with no significant atherosclerotic disease. 4. Small remote infarct in the right parietal lobe. These results were called by telephone at the time of interpretation on 02/27/2021 at 12:00 pm to provider Wynona Dove , who verbally acknowledged these results. Electronically Signed   By: Valetta Mole M.D.   On: 02/27/2021 12:05   DG Chest Port 1 View  Result Date: 02/27/2021 CLINICAL DATA:  Stroke symptoms EXAM: PORTABLE CHEST 1 VIEW COMPARISON:  Chest radiograph 04/16/2019 FINDINGS: The heart is at the upper limits of normal for size, exaggerated by AP technique. There is unchanged slight asymmetric elevation of the right hemidiaphragm. There is no focal consolidation or pulmonary edema. There is no pleural effusion or pneumothorax. There is no acute osseous abnormality. IMPRESSION: No radiographic evidence of acute cardiopulmonary process. Electronically Signed   By: Valetta Mole M.D.   On: 02/27/2021 10:32        Scheduled Meds:  aspirin EC  81 mg Oral Daily   atorvastatin  80 mg Oral Daily   buPROPion  450 mg Oral q morning   cyanocobalamin  1,000 mcg Intramuscular Daily   metoprolol succinate  150 mg Oral Daily   potassium chloride  40 mEq Oral Once   pregabalin  50 mg Oral BID   [START ON 03/07/2021] vitamin B-12  1,000 mcg Oral Daily   Vitamin D (Ergocalciferol)  50,000 Units Oral Q7 days   Continuous Infusions:  diltiazem (CARDIZEM) infusion 5 mg/hr (02/28/21 0422)     LOS: 1 day    Time spent: 35 minutes.     Elmarie Shiley, MD [facility]   If 7PM-7AM,  please contact night-coverage [email or web address]  02/28/2021, 7:32 AM

## 2021-02-28 NOTE — Evaluation (Addendum)
Occupational Therapy Evaluation Patient Details Name: George [name]. MRN: [id number] DOB: 01/27/1946 Today's Date: 02/28/2021   History of Present Illness 75 y.o. male presents to [facility] hospital on 02/27/2021 with HA and L weakness. L hemianopsia also noted in ED. CT head revealed subacute R PCA infarct with small foci of petechial hemorrhage, as well as small infarct in parietal lobe. PMH includes colon cancer s/p resection, prostate cancer, anxiety/depression, osteoarthritis of bilateral knees.   Clinical Impression   PTA, pt lives with spouse and reports complete Independence in all daily tasks. Pt presents now with diagnoses above and deficits in coordination, vision, cognition (safety awareness), strength and balance. Pt currently requires Max A x 2 for BSC transfers, Min A for UB ADLs and Max A x 2 for LB ADLs due to deficits. Noted L visual field inattention and poor coordination of motor movements increasing fall risk. Recommend CIR for intensive therapies at DC.       Recommendations for follow up therapy are one component of a multi-disciplinary discharge planning process, led by the attending physician.  Recommendations may be updated based on patient status, additional functional criteria and insurance authorization.   Follow Up Recommendations  Acute inpatient rehab (3hours/day)    Assistance Recommended at Discharge Frequent or constant Supervision/Assistance  Functional Status Assessment  Patient has had a recent decline in their functional status and demonstrates the ability to make significant improvements in function in a reasonable and predictable amount of time.  Equipment Recommendations  BSC/3in1;Other (comment) (Rolling walker; TBD pending progress)    Recommendations for Other Services Rehab consult     Precautions / Restrictions Precautions Precautions: Fall;Other (comment) Precaution Comments: L inattention Restrictions Weight Bearing Restrictions: No       Mobility Bed Mobility Overal bed mobility: Needs Assistance Bed Mobility: Supine to Sit;Sit to Supine     Supine to sit: Min assist;HOB elevated Sit to supine: Supervision   General bed mobility comments: impulsively returning to supine, no assist needed but cues for safety    Transfers Overall transfer level: Needs assistance   Transfers: Sit to/from Stand;Bed to chair/wheelchair/BSC Sit to Stand: Max assist;+2 physical assistance;+2 safety/equipment Stand pivot transfers: Max assist;+2 physical assistance;+2 safety/equipment         General transfer comment: +2 assist for safety/balance with noted L sided coordinatin deficits and decreased awareness      Balance Overall balance assessment: Needs assistance Sitting-balance support: No upper extremity supported;Feet supported Sitting balance-Leahy Scale: Poor Sitting balance - Comments: intermittent lateral LOB, cues to correct   Standing balance support: Bilateral upper extremity supported;No upper extremity supported;During functional activity Standing balance-Leahy Scale: Poor Standing balance comment: reliant on max external assist                           ADL either performed or assessed with clinical judgement   ADL Overall ADL's : Needs assistance/impaired Eating/Feeding: Minimal assistance;Sitting   Grooming: Minimal assistance;Sitting   Upper Body Bathing: Moderate assistance;Sitting   Lower Body Bathing: Maximal assistance;+2 for physical assistance;+2 for safety/equipment;Sit to/from stand   Upper Body Dressing : Moderate assistance;Sitting   Lower Body Dressing: Maximal assistance;+2 for physical assistance;+2 for safety/equipment;Sit to/from stand   Toilet Transfer: Maximal assistance;+2 for physical assistance;+2 for safety/equipment;Stand-pivot;BSC/3in1 Toilet Transfer Details (indicate cue type and reason): Max A x 2 for pivot to/from [facility] with manual assist. On entry, pt nephew  assisting pt to EOB for bathroom mobility - due to noted L  sided deficits, encouraged use of BSC instead due to high fall risk Toileting- Clothing Manipulation and Hygiene: Maximal assistance;+2 for physical assistance;+2 for safety/equipment;Sit to/from stand Toileting - Clothing Manipulation Details (indicate cue type and reason): unable to void on BSC. +2 assist for clothing mgmt and balance       General ADL Comments: Noted deficits in cognition (attention, impulsive), vision and L sided coordination     Vision Baseline Vision/History: 1 Wears glasses (for driving) Ability to See in Adequate Light: 2 Moderately impaired Patient Visual Report: Other (comment) (hemianopsia per chart) Vision Assessment?: Yes;Vision impaired- to be further tested in functional context Eye Alignment: Impaired (comment) Ocular Range of Motion: Restricted on the left Alignment/Gaze Preference: Gaze right;Head turned Tracking/Visual Pursuits: Impaired - to be further tested in functional context Saccades: Overshoots;Undershoots Visual Fields: Impaired-to be further tested in functional context;Left visual field deficit Depth Perception: Overshoots;Undershoots Additional Comments: difficulty scanning, but with target in midline - able to track to L. unable to find objects on L side with cues only, denies blurriness or double vision, to be further assessed     Perception     Praxis      Pertinent Vitals/Pain Pain Assessment: Faces Faces Pain Scale: Hurts little more Pain Location: head Pain Descriptors / Indicators: Headache Pain Intervention(s): Monitored during session     Hand Dominance Right   Extremity/Trunk Assessment Upper Extremity Assessment Upper Extremity Assessment: LUE deficits/detail;RUE deficits/detail RUE Deficits / Details: strength 4/5, coordination deficits L > R RUE Coordination: decreased fine motor;decreased gross motor LUE Deficits / Details: strength 4/5, coordination  deficits L > R LUE Sensation: decreased proprioception LUE Coordination: decreased fine motor;decreased gross motor   Lower Extremity Assessment Lower Extremity Assessment: Defer to PT evaluation   Cervical / Trunk Assessment Cervical / Trunk Assessment: Normal   Communication Communication Communication: Receptive difficulties;Expressive difficulties   Cognition Arousal/Alertness: Awake/alert Behavior During Therapy: Restless;Impulsive Overall Cognitive Status: Impaired/Different from baseline Area of Impairment: Orientation;Attention;Memory;Following commands;Safety/judgement;Awareness;Problem solving                 Orientation Level: Disoriented to;Situation Current Attention Level: Sustained Memory: Decreased recall of precautions Following Commands: Follows one step commands inconsistently;Follows one step commands with increased time Safety/Judgement: Decreased awareness of safety;Decreased awareness of deficits Awareness: Emergent Problem Solving: Difficulty sequencing;Requires verbal cues;Requires tactile cues General Comments: Pt very impulsive, unaware of vision/coordination and balance deficits. poor attention to tasks requiring frequent cues to redirect. able to report date, place     General Comments  Nephew present and physically assisting pt    Exercises     Shoulder Instructions      Home Living Family/patient expects to be discharged to:: Private residence Living Arrangements: Spouse/significant other Available Help at Discharge: Family;Available 24 hours/day Type of Home: House Home Access: Level entry     Home Layout: One level     Bathroom Shower/Tub: Occupational psychologist: Standard Bathroom Accessibility: Yes How Accessible: Accessible via wheelchair Home Equipment: None          Prior Functioning/Environment Prior Level of Function : Independent/Modified Independent;Driving             Mobility Comments: no use  of AD ADLs Comments: Drives, Independent in all daily tasks. previously designed and worked on houses        OT Problem List: Decreased strength;Decreased activity tolerance;Impaired balance (sitting and/or standing);Decreased coordination;Impaired vision/perception;Decreased cognition;Decreased safety awareness;Decreased knowledge of use of DME or AE;Decreased knowledge of precautions;Impaired UE functional use  OT Treatment/Interventions: Self-care/ADL training;Therapeutic exercise;DME and/or AE instruction;Therapeutic activities;Patient/family education;Balance training    OT Goals(Current goals can be found in the care plan section) Acute Rehab OT Goals Patient Stated Goal: be able to walk to bathroom OT Goal Formulation: With patient Time For Goal Achievement: 03/14/21 Potential to Achieve Goals: Good ADL Goals Pt Will Perform Grooming: with min guard assist;standing Pt Will Perform Lower Body Bathing: with min guard assist;sit to/from stand Pt Will Perform Lower Body Dressing: with min guard assist;sit to/from stand Pt Will Transfer to Toilet: with min guard assist;stand pivot transfer;bedside commode  OT Frequency: Min 2X/week   Barriers to D/C:            Co-evaluation              AM-PAC OT "6 Clicks" Daily Activity     Outcome Measure Help from another person eating meals?: A Little Help from another person taking care of personal grooming?: A Little Help from another person toileting, which includes using toliet, bedpan, or urinal?: A Lot Help from another person bathing (including washing, rinsing, drying)?: A Lot Help from another person to put on and taking off regular upper body clothing?: A Little Help from another person to put on and taking off regular lower body clothing?: A Lot 6 Click Score: 15   End of Session Equipment Utilized During Treatment: Gait belt Nurse Communication: Mobility status  Activity Tolerance: Patient limited by  fatigue Patient left: in bed;with call bell/phone within reach;with family/visitor present;Other (comment) (with PT)  OT Visit Diagnosis: Unsteadiness on feet (R26.81);Other abnormalities of gait and mobility (R26.89);Muscle weakness (generalized) (M62.81);Other symptoms and signs involving cognitive function;Hemiplegia and hemiparesis Hemiplegia - Right/Left: Left Hemiplegia - dominant/non-dominant: Non-Dominant Hemiplegia - caused by: Nontraumatic intracerebral hemorrhage                Time: 0340-3524 OT Time Calculation (min): 24 min Charges:  OT General Charges $OT Visit: 1 Visit OT Evaluation $OT Eval Moderate Complexity: 1 Mod OT Treatments $Self Care/Home Management : 8-22 mins  Malachy Chamber, OTR/L Acute Rehab Services Office: [phone]   Layla Maw 02/28/2021, 1:47 PM

## 2021-02-28 NOTE — Progress Notes (Signed)
  Echocardiogram 2D Echocardiogram has been performed.  George [name] 02/28/2021, 3:33 PM

## 2021-02-28 NOTE — Progress Notes (Signed)
ANTICOAGULATION CONSULT NOTE - Initial Consult  Pharmacy Consult for Eliquis dosing Indication: atrial fibrillation (new onset)  Allergies  Allergen Reactions   Amlodipine Other (See Comments)    Dizziness    Gabapentin     hallucinations    Patient Measurements: Height: 6\' 1"  (185.4 cm) Weight: 89.2 kg (196 lb 10.4 oz) IBW/kg (Calculated) : 79.9  Vital Signs: Temp: 98.3 F (36.8 C) (11/19 0530) Temp Source: Oral (11/19 0530) BP: 121/89 (11/19 0800) Pulse Rate: 75 (11/19 0800)  Labs: Recent Labs    02/27/21 0937 02/27/21 1945 02/28/21 0027  HGB 13.4 13.6  --   HCT 41.6 43.8  --   PLT 294 306  --   LABPROT 13.1  --   --   INR 1.0  --   --   CREATININE 1.63* 1.60* 1.60*  TROPONINIHS 13 27*  --     Estimated Creatinine Clearance: 43.7 mL/min (A) (by C-G formula based on SCr of 1.6 mg/dL (H)).   Medical History: Past Medical History:  Diagnosis Date   Anxiety    Arthritis    OSTEO IN KNEE   Asthma    as child   Colon cancer ([place])    Depression    Elevated prostate specific antigen (PSA)    Generalized anxiety disorder 04/09/2015   Hematospermia    Hx antineoplastic chemotherapy 2010   Hypertension    Idiopathic peripheral neuropathy    TOES OF BOTH FEET DUE TO CHEMO   Incomplete bladder emptying    Malignant neoplasm of prostate ([place])    Nodular prostate with lower urinary tract symptoms    Panic attacks 04/09/2015   Primary localized osteoarthrosis of the knee, right 05/19/2016   Prostatitis    S/P total knee arthroplasty, left 05/19/2016   Spermatocele    Weak urinary stream     Medications:  See MAR  Assessment: Admitted with CVA with petechial hemorrhage. SCr 1.60, Wt >60kg, age <80 years. CBC normal, no signs of other bleeding noted. Discussed with neuro- still recommended in setting of small hemorrhage.   Goal of Therapy:  Stroke prevention in setting of atrial fibrillation.   Plan:  Start apixaban 5 mg BID Monitor CBC and signs of  bleeding  George [name] 02/28/2021,12:56 PM

## 2021-02-28 NOTE — Progress Notes (Signed)
Pt brought down down to MRI for exam. Pt A/O per nurse. Pt verbally safety screened. Pt was able to provide a detailed surgical history. Pt was specifically asked if any metal shrapnel or metal fragment injuries. Pt denied any metal shrapnel or fragments anywhere in body. Pt prepared for exam and began scanning. Upon scanning the first image localizer, noticed significant metal artifact in eye region. Immediately stopped scan, removed pt from scanner, asked if pt was ok, pt said yes. Asked pt if any pain or warmth in eye region. Pt denied any pain or warmth. Again asked pt if any metal fragments in eyes or any surgeries to the eyes. Pt again denied. Noticed pt had prior CT imaging. Noticed fragment in eye region. Called a radiologist (Dr. Quintella Baton). Assessed situation and stated unsafe to continue with exam. Pt made aware and transferred back to bed from imaging table. Pt sent back to ED via pt transport.

## 2021-02-28 NOTE — Progress Notes (Addendum)
STROKE TEAM PROGRESS NOTE   INTERVAL HISTORY He is lying in bed in NAD. No family at bedside. He states he did not get his MRI due to being told he has metal in his eye. No new neurological events overnight. Patient with left hemianopsia, left ataxia left weakness. He complains of a headache. Informed him he has new onset Atrial fibrillation and that we will be starting Eliquis  CT scan of the head shows a right PCA infarct and CT angiogram shows right P2 occlusion.  Echocardiogram is pending.  Vitamin B12 is low and has been replaced Vitals:   02/28/21 0332 02/28/21 0530 02/28/21 0800 02/28/21 1255  BP: 140/85 (!) 130/91 121/89 (!) 143/93  Pulse: 68 70 75 85  Resp: 15 17 14 18   Temp: 98.6 F (37 C) 98.3 F (36.8 C)  99.4 F (37.4 C)  TempSrc: Oral Oral  Oral  SpO2: 98% 97% 98%   Weight:      Height:       CBC:  Recent Labs  Lab 02/27/21 0937 02/27/21 1945  WBC 10.4 10.2  NEUTROABS 7.6  --   HGB 13.4 13.6  HCT 41.6 43.8  MCV 89.8 89.9  PLT 294 093   Basic Metabolic Panel:  Recent Labs  Lab 02/27/21 0937 02/27/21 1945 02/28/21 0027  NA 142  --  137  K 3.2*  --  3.2*  CL 107  --  107  CO2 22  --  23  GLUCOSE 97  --  119*  BUN 12  --  9  CREATININE 1.63* 1.60* 1.60*  CALCIUM 9.9  --  8.8*  MG 2.1  --  2.3   Lipid Panel:  Recent Labs  Lab 02/28/21 0027  CHOL 119  TRIG 126  HDL 36*  CHOLHDL 3.3  VLDL 25  LDLCALC 58   HgbA1c:  Recent Labs  Lab 02/28/21 0027  HGBA1C 5.4   Urine Drug Screen:  Recent Labs  Lab 02/27/21 1126  LABOPIA NONE DETECTED  COCAINSCRNUR NONE DETECTED  LABBENZ NONE DETECTED  AMPHETMU NONE DETECTED  THCU NONE DETECTED  LABBARB NONE DETECTED    Alcohol Level No results for input(s): ETH in the last 168 hours.  IMAGING past 24 hours No results found.  PHYSICAL EXAM  Temp:  [97.9 F (36.6 C)-99.5 F (37.5 C)] 99.4 F (37.4 C) (11/19 1255) Pulse Rate:  [68-116] 85 (11/19 1255) Resp:  [11-20] 18 (11/19 1255) BP:  (121-161)/(85-96) 143/93 (11/19 1255) SpO2:  [96 %-100 %] 98 % (11/19 0800) Weight:  [89.2 kg] 89.2 kg (11/18 1949)  General - Well nourished, well developed, in no apparent distress.  Ophthalmologic - fundi not visualized due to noncooperation.  Cardiovascular - irregular  Mental Status -  Level of arousal and orientation to time, place, and person were intact. Language including expression, naming, repetition, comprehension was assessed and found intact. Attention span and concentration were normal.  Recall 3/3. Recent and remote memory were intact. Fund of Knowledge was assessed and was intact.  Cranial Nerves II - XII - II - left dense homonymous hemianopsia III, IV, VI - Extraocular movements intact. V - Facial sensation intact bilaterally. VII - Facial movement intact bilaterally. VIII - Hearing & vestibular intact bilaterally. X - Palate elevates symmetrically. XI - Chin turning & shoulder shrug intact bilaterally. XII - Tongue protrusion intact.  Motor Strength - RUE and RLE 5/5, LUE 5/5 and  LLE 4/5.  Bulk was normal and fasciculations were absent.   Motor  Tone - Muscle tone was assessed at the neck and appendages and was normal.  Sensory - Light touch, temperature/pinprick were assessed and were symmetrical.    Coordination - left mild finger-to-nose ataxia  Tremor was absent.  Gait and Station - deferred.   ASSESSMENT/PLAN [name]. is a 75 y.o. male with history of 75 y.o. male with history h/o colon cancer s/p resection, prostate cancer, anxiety/depression, osteoarthritis of bilateral knees who lives at home with wife presented to [facility] ED with complaints of persistent headaches and left-sided weakness since early this week.  He reports getting a flu shot on Monday morning (11/14) and later in the day started experiencing severe frontal and bitemporal headaches (right greater than left), 10/10 associated with coordination difficulties that were noted by wife  later in the evening.  He states his left side seem to have "trouble coordinating" and he attributed his symptoms to the flu shot he had received earlier in the day.  Next day he was noted to have difficulty ambulating and with coordination primarily due to left-sided weakness but he did not want to come to the hospital  Stroke: Subacute Right PCA  infarct embolic secondary to new onset A Fib  CTH and CTA head & neck  1. Subacute right PCA territorial infarct with probable small foci of petechial hemorrhage. 2. Possible focal occlusion of a P2 branch at the level of the ambient cistern, though the PCA is identified within the hypodense right occipital lobe. 3. Otherwise, patent vasculature of the head and neck with no significant atherosclerotic disease. 4. Small remote infarct in the right parietal lobe MRI  Unable to obtain due to metal in eye 2D Echo ordered EKG with A Fib LDL 58 HgbA1c 5.4 VTE prophylaxis - SCD's    Diet   Diet Heart Room service appropriate? Yes; Fluid consistency: Thin   No antithrombotic prior to admission, now on  Eliquis BID .  Therapy recommendations:  pending  Disposition:  pending  Hypertension Home meds:  losartan Toprol XL Stable Permissive hypertension (OK if < 220/120) but gradually normalize in 5-7 days Long-term BP goal normotensive  Hyperlipidemia Home meds:  atorvastatin 20mg  , resumed in hospital LDL 58, goal < 70 Continue statin at discharge  New Onset A fib Started on Eliquis 5 mg BID EKG  with A fib  Echo pending   Other Stroke Risk Factors Advanced Age >/= 73  Former Cigarette smoker   Hospital day # 1  Beulah Gandy, NP   STROKE MD NOTE :  I have personally obtained history,examined this patient, reviewed notes, independently viewed imaging studies, participated in medical decision making and plan of care.ROS completed by me personally and pertinent positives fully documented  I have made any additions or clarifications  directly to the above note. Agree with note above.  Patient presented with left-sided peripheral vision loss due to subacute right PCA infarct from new onset A. fib.  Cannot have an MRI due to metal in his eye.  Continue ongoing stroke work-up and aggressive risk factor modification start Eliquis for stroke prevention.  Mobilize out of bed.  Therapy consults.  Replace vitamin B12 as per primary team..  Discussed with Dr. Tyrell Antonio.  Patient was advised not to drive till his peripheral vision loss improves.  Patient was advised not to drive till peripheral vision loss improves.  Greater than 50% time during this 35-minute visit was spent in counseling and coordination of his embolic stroke, diagnosis of new atrial fibrillation  and answered patient's questions.  Stroke team will sign off.  Kindly call for questions  Antony Contras, MD Medical Director [place] Pager: [phone] 02/28/2021 4:13 PM   To contact Stroke Continuity provider, please refer to [email or web address] After hours, contact General Neurology

## 2021-02-28 NOTE — Evaluation (Signed)
Physical Therapy Evaluation Patient Details Name: George [name]. MRN: [id number] DOB: [birth year] Today's Date: 02/28/2021  History of Present Illness  75 y.o. male presents to [facility] hospital on [facility]/27/[facility]21 with HA and L weakness. L hemianopsia also noted in ED. CT head revealed subacute R PCA infarct with small foci of petechial hemorrhage, as well as small infarct in parietal lobe. PMH includes colon cancer s/p resection, prostate cancer, anxiety/depression, osteoarthritis of bilateral knees.  Clinical Impression  Pt presents to PT with deficits in functional mobility, gait, balance, coordination, awareness, cognition, vision. Pt with significant L sided coordination deficits as well as L inattention noted during session. Pt leaving LLE behind when performing bed mobility, placing LLE in vulnerable positions. Pt refuses out of bed mobility at this time despite PT encouragement. PT recommends CIR placement as the pt was very independent prior to admission and demonstrates the potential to make significant functional gains with high intensity inpatient PT services.       Recommendations for follow up therapy are one component of a multi-disciplinary discharge planning process, led by the attending physician.  Recommendations may be updated based on patient status, additional functional criteria and insurance authorization.  Follow Up Recommendations Acute inpatient rehab (3hours/day)    Assistance Recommended at Discharge Frequent or constant Supervision/Assistance  Functional Status Assessment Patient has had a recent decline in their functional status and demonstrates the ability to make significant improvements in function in a reasonable and predictable amount of time.  Equipment Recommendations   (TBD)    Recommendations for Other Services Rehab consult     Precautions / Restrictions Precautions Precautions: Fall;Other (comment) Precaution Comments: L inattention Restrictions Weight  Bearing Restrictions: No      Mobility  Bed Mobility Overal bed mobility: Needs Assistance Bed Mobility: Supine to Sit;Sit to Supine     Supine to sit: Min assist Sit to supine: Min guard   General bed mobility comments: pt leaving LLE behind due to poor awareness, requires cueing to assist LLE and bring awareness to LLE positioning. Pt transitions from supine<>sit and back 3 times during session    Transfers Overall transfer level:  (pt refuses transfers, wanting to eat and sleep before mobilizing further)   Transfers: Sit to/from Stand;Bed to chair/wheelchair/BSC Sit to Stand: Max assist;+2 physical assistance;+2 safety/equipment Stand pivot transfers: Max assist;+2 physical assistance;+2 safety/equipment         General transfer comment: +2 assist for safety/balance with noted L sided coordinatin deficits and decreased awareness    Ambulation/Gait                  Stairs            Wheelchair Mobility    Modified Rankin (Stroke Patients Only) Modified Rankin (Stroke Patients Only) Pre-Morbid Rankin Score: No symptoms Modified Rankin: Moderately severe disability     Balance Overall balance assessment: Needs assistance Sitting-balance support: No upper extremity supported;Feet supported Sitting balance-Leahy Scale: Poor Sitting balance - Comments: minA   Standing balance support: Bilateral upper extremity supported;No upper extremity supported;During functional activity Standing balance-Leahy Scale: Poor Standing balance comment: reliant on max external assist                             Pertinent Vitals/Pain Pain Assessment: No/denies pain Faces Pain Scale: Hurts little more Pain Location: head Pain Descriptors / Indicators: Headache Pain Intervention(s): Monitored during session    Home Living Family/patient expects to be discharged  to:: Private residence Living Arrangements: Spouse/significant other Available Help at  Discharge: Family;Available 24 hours/day Type of Home: House Home Access: Level entry       Home Layout: One level Home Equipment: None      Prior Function Prior Level of Function : Independent/Modified Independent;Driving             Mobility Comments: no use of AD ADLs Comments: Drives, Independent in all daily tasks. previously designed and worked on houses     Journalist, newspaper   Dominant Hand: Right    Extremity/Trunk Assessment   Upper Extremity Assessment Upper Extremity Assessment: Defer to OT evaluation RUE Deficits / Details: strength 4/5, coordination deficits L > R RUE Coordination: decreased fine motor;decreased gross motor LUE Deficits / Details: strength 4/5, coordination deficits L > R LUE Sensation: decreased proprioception LUE Coordination: decreased fine motor;decreased gross motor    Lower Extremity Assessment Lower Extremity Assessment: LLE deficits/detail LLE Deficits / Details: 4+/5 strength grossly, ROM WFL. LLE Coordination: decreased gross motor    Cervical / Trunk Assessment Cervical / Trunk Assessment: Normal  Communication   Communication: Receptive difficulties;Expressive difficulties  Cognition Arousal/Alertness: Awake/alert Behavior During Therapy: Impulsive Overall Cognitive Status: Impaired/Different from baseline Area of Impairment: Orientation;Attention;Memory;Following commands;Safety/judgement;Awareness;Problem solving                 Orientation Level: Disoriented to;Situation Current Attention Level: Sustained Memory: Decreased recall of precautions Following Commands: Follows one step commands with increased time Safety/Judgement: Decreased awareness of safety;Decreased awareness of deficits Awareness: Intellectual Problem Solving: Difficulty sequencing;Requires verbal cues;Requires tactile cues General Comments: Pt very impulsive, unaware of vision/coordination and balance deficits. poor attention to tasks  requiring frequent cues to redirect. able to report date, place        General Comments General comments (skin integrity, edema, etc.): nephew present and supportive, VSS during session    Exercises     Assessment/Plan    PT Assessment Patient needs continued PT services  PT Problem List Decreased activity tolerance;Decreased balance;Decreased mobility;Decreased coordination;Decreased cognition;Decreased safety awareness;Decreased knowledge of precautions       PT Treatment Interventions DME instruction;Gait training;Stair training;Functional mobility training;Therapeutic activities;Therapeutic exercise;Balance training;Neuromuscular re-education;Patient/family education    PT Goals (Current goals can be found in the Care Plan section)  Acute Rehab PT Goals Patient Stated Goal: to return to independence PT Goal Formulation: With patient Time For Goal Achievement: 03/14/21 Potential to Achieve Goals: Fair    Frequency Min 4X/week   Barriers to discharge        Co-evaluation               AM-PAC PT "6 Clicks" Mobility  Outcome Measure Help needed turning from your back to your side while in a flat bed without using bedrails?: A Little Help needed moving from lying on your back to sitting on the side of a flat bed without using bedrails?: A Little Help needed moving to and from a bed to a chair (including a wheelchair)?: Total Help needed standing up from a chair using your arms (e.g., wheelchair or bedside chair)?: Total Help needed to walk in hospital room?: Total Help needed climbing 3-5 steps with a railing? : Total 6 Click Score: 10    End of Session   Activity Tolerance: Patient limited by fatigue Patient left: in bed;with call bell/phone within reach;with bed alarm set;with family/visitor present Nurse Communication: Mobility status PT Visit Diagnosis: Other abnormalities of gait and mobility (R26.89);Other symptoms and signs involving the nervous system  (  R29.898)    Time: 4287-6811 PT Time Calculation (min) (ACUTE ONLY): 9 min   Charges:   PT Evaluation $PT Eval Low Complexity: 1 Low          Zenaida Niece, PT, DPT Acute Rehabilitation Pager: [phone] Office [phone]   Zenaida Niece 02/28/2021, 2:49 PM

## 2021-03-01 ENCOUNTER — Inpatient Hospital Stay (HOSPITAL_COMMUNITY): Payer: Medicare Other

## 2021-03-01 DIAGNOSIS — N179 Acute kidney failure, unspecified: Secondary | ICD-10-CM | POA: Diagnosis not present

## 2021-03-01 LAB — URINALYSIS, ROUTINE W REFLEX MICROSCOPIC
Bacteria, UA: NONE SEEN
Bilirubin Urine: NEGATIVE
Glucose, UA: NEGATIVE mg/dL
Hgb urine dipstick: NEGATIVE
Ketones, ur: NEGATIVE mg/dL
Leukocytes,Ua: NEGATIVE
Nitrite: NEGATIVE
Protein, ur: 30 mg/dL — AB
Specific Gravity, Urine: 1.03 (ref 1.005–1.030)
pH: 5 (ref 5.0–8.0)

## 2021-03-01 LAB — BASIC METABOLIC PANEL
Anion gap: 7 (ref 5–15)
BUN: 19 mg/dL (ref 8–23)
CO2: 22 mmol/L (ref 22–32)
Calcium: 8.3 mg/dL — ABNORMAL LOW (ref 8.9–10.3)
Chloride: 107 mmol/L (ref 98–111)
Creatinine, Ser: 2.05 mg/dL — ABNORMAL HIGH (ref 0.61–1.24)
GFR, Estimated: 33 mL/min — ABNORMAL LOW (ref >60)
Glucose, Bld: 118 mg/dL — ABNORMAL HIGH (ref 70–99)
Potassium: 3.2 mmol/L — ABNORMAL LOW (ref 3.5–5.1)
Sodium: 136 mmol/L (ref 135–145)

## 2021-03-01 LAB — CBC
HCT: 33.7 % — ABNORMAL LOW (ref 39.0–52.0)
Hemoglobin: 10.9 g/dL — ABNORMAL LOW (ref 13.0–17.0)
MCH: 29.1 pg (ref 26.0–34.0)
MCHC: 32.3 g/dL (ref 30.0–36.0)
MCV: 89.9 fL (ref 80.0–100.0)
Platelets: 269 10*3/uL (ref 150–400)
RBC: 3.75 MIL/uL — ABNORMAL LOW (ref 4.22–5.81)
RDW: 14 % (ref 11.5–15.5)
WBC: 10.3 10*3/uL (ref 4.0–10.5)
nRBC: 0 % (ref 0.0–0.2)

## 2021-03-01 LAB — GLUCOSE, CAPILLARY
Glucose-Capillary: 87 mg/dL (ref 70–99)
Glucose-Capillary: 111 mg/dL — ABNORMAL HIGH (ref 70–99)

## 2021-03-01 MED ORDER — CHLORHEXIDINE GLUCONATE CLOTH 2 % EX PADS
6.0000 | MEDICATED_PAD | Freq: Every day | CUTANEOUS | Status: DC
  Administered 2021-03-01 – 2021-03-05 (×5): 6 via TOPICAL

## 2021-03-01 MED ORDER — POTASSIUM CHLORIDE CRYS ER 20 MEQ PO TBCR
40.0000 meq | EXTENDED_RELEASE_TABLET | Freq: Once | ORAL | Status: AC
  Administered 2021-03-01: 40 meq via ORAL
  Filled 2021-03-01: qty 2

## 2021-03-01 MED ORDER — METOPROLOL SUCCINATE ER 100 MG PO TB24
100.0000 mg | ORAL_TABLET | Freq: Every day | ORAL | Status: DC
  Administered 2021-03-01 – 2021-03-03 (×3): 100 mg via ORAL
  Filled 2021-03-01 (×3): qty 1

## 2021-03-01 NOTE — Evaluation (Signed)
Speech Language Pathology Evaluation Patient Details Name: Owain Eckerman. MRN: [id number] DOB: [birth year] Today's Date: 03/01/2021 Time: 1345-1400 SLP Time Calculation (min) (ACUTE ONLY): 15 min  Problem List:  Patient Active Problem List   Diagnosis Date Noted   CVA (cerebral vascular accident) ([place]) 02/27/2021   Atrial fibrillation with RVR ([place]) 02/27/2021   Hypokalemia 02/27/2021   Prolonged QT interval 02/27/2021   Polyneuropathy 01/31/2018   Chemotherapy-induced neuropathy ([place]) 01/31/2018   B12 deficiency 01/31/2018   Prediabetes 01/31/2018   Basal ganglia infarction ([place]) 01/31/2018   SBO (small bowel obstruction) ([place]) 10/20/2017   Primary localized osteoarthritis of right knee 05/31/2016   Primary localized osteoarthrosis of the knee, right 05/19/2016   S/P total knee arthroplasty, left 05/19/2016   Dyspnea 01/20/2016   DJD (degenerative joint disease) of knee 04/21/2015   Primary localized osteoarthritis of left knee 04/09/2015   Generalized anxiety disorder 04/09/2015   Panic attacks 04/09/2015   Spermatocele 05/08/2014   Benign localized hyperplasia of prostate with urinary obstruction 05/07/2014   Malignant neoplasm of prostate ([place]) 04/22/2014   Past Medical History:  Past Medical History:  Diagnosis Date   Anxiety    Arthritis    OSTEO IN KNEE   Asthma    as child   Colon cancer ([place])    Depression    Elevated prostate specific antigen (PSA)    Generalized anxiety disorder 04/09/2015   Hematospermia    Hx antineoplastic chemotherapy 2010   Hypertension    Idiopathic peripheral neuropathy    TOES OF BOTH FEET DUE TO CHEMO   Incomplete bladder emptying    Malignant neoplasm of prostate ([place])    Nodular prostate with lower urinary tract symptoms    Panic attacks 04/09/2015   Primary localized osteoarthrosis of the knee, right 05/19/2016   Prostatitis    S/P total knee arthroplasty, left 05/19/2016   Spermatocele    Weak urinary stream    Past  Surgical History:  Past Surgical History:  Procedure Laterality Date   COLON RESECTION   NOV 2009   COLONOSCOPY     PROSTATE BIOPSY     SPERMATOCELECTOMY Left 05/07/2014   Procedure: LEFT SPERMATOCELECTOMY;  Surgeon: Malka So, MD;  Location: [facility];  Service: Urology;  Laterality: Left;   TONSILLECTOMY     TOTAL KNEE ARTHROPLASTY Left 04/21/2015   Procedure: LEFT TOTAL KNEE ARTHROPLASTY;  Surgeon: Elsie Saas, MD;  Location: [place];  Service: Orthopedics;  Laterality: Left;   TOTAL KNEE ARTHROPLASTY Right 05/31/2016   Procedure: TOTAL KNEE ARTHROPLASTY;  Surgeon: Elsie Saas, MD;  Location: [place];  Service: Orthopedics;  Laterality: Right;   TRANSURETHRAL RESECTION OF PROSTATE N/A 05/07/2014   Procedure: TRANSURETHRAL RESECTION OF THE PROSTATE WITH GYRUS INSTRUMENTS;  Surgeon: Malka So, MD;  Location: [facility];  Service: Urology;  Laterality: N/A;   HPI:  75 y.o. male presents to [facility] hospital on 02/27/2021 with HA and L weakness. L hemianopsia also noted in ED. CT head revealed subacute R PCA infarct with small foci of petechial hemorrhage, as well as small infarct in parietal lobe. PMH includes colon cancer s/p resection, prostate cancer, anxiety/depression, osteoarthritis of bilateral knees.   Assessment / Plan / Recommendation Clinical Impression  Patient presents with a mild cognitive impairment as per this assessment. He participated in completing SLUMS testing and received a score of 28/30, placing him in the range of 'Normal'. He exhibited difficulties in self-monitoring, organizing and problem solving at more complex level. He appears with  improving awareness to his deficits (left side visual neglect, etc) and memory appears intact. SLP is not recommending acute level services but patient would benefit from more complex level cognitive evaluation at next venue of care to determine need for services. Patient and spouse both in agreement.    SLP Assessment  SLP  Recommendation/Assessment: All further Speech Lanaguage Pathology  needs can be addressed in the next venue of care SLP Visit Diagnosis: Cognitive communication deficit (R41.841)    Recommendations for follow up therapy are one component of a multi-disciplinary discharge planning process, led by the attending physician.  Recommendations may be updated based on patient status, additional functional criteria and insurance authorization.    Follow Up Recommendations  Acute inpatient rehab (3hours/day)    Assistance Recommended at Discharge  Other (comment) (pending progress)  Functional Status Assessment Patient has had a recent decline in their functional status and demonstrates the ability to make significant improvements in function in a reasonable and predictable amount of time.  Frequency and Duration           SLP Evaluation Cognition  Overall Cognitive Status: Impaired/Different from baseline Arousal/Alertness: Awake/alert Orientation Level: Oriented X4 Year: 2022 Month: November Day of Week: Correct Memory: Appears intact (recalled 5/5 words after 2 minute delay) Problem Solving: Impaired Problem Solving Impairment: Verbal complex Executive Function: Self Monitoring Self Monitoring: Impaired Self Monitoring Impairment: Verbal complex Safety/Judgment: Appears intact       Comprehension  Auditory Comprehension Overall Auditory Comprehension: Appears within functional limits for tasks assessed    Expression Expression Primary Mode of Expression: Verbal Verbal Expression Overall Verbal Expression: Appears within functional limits for tasks assessed Initiation: No impairment Repetition: No impairment Naming: No impairment Pragmatics: No impairment   Oral / Motor  Oral Motor/Sensory Function Overall Oral Motor/Sensory Function: Within functional limits Motor Speech Overall Motor Speech: Appears within functional limits for tasks assessed Respiration: Within functional  limits Resonance: Within functional limits Articulation: Within functional limitis Intelligibility: Intelligible Motor Planning: Witnin functional limits   GO                   Sonia Baller, MA, CCC-SLP Speech Therapy

## 2021-03-01 NOTE — Progress Notes (Signed)
PROGRESS NOTE    George [name].  ZOX:[id number] DOB: [birth year] DOA: 02/27/2021 PCP: Charolette Forward, MD   Brief Narrative: 75 year old with past medical history significant for colon cancer status postresection, prostate cancer, anxiety/depression, osteoarthritis of  bilateral knees, who presented to the ED complaining of persistent legs and left-sided weakness since Thursday (11/17).  He has been feeling sick since Monday after he got the flu shot.   Evaluation in the ED patient was found to have left visual defect, hemianopsia not, left hemiparesis.  CT CTA head and neck revealed subacute right PCA territorial infarct with probably small foci of petechial hemorrhage.  Possible focal occlusion of P2 branch.  Patient subsequently developed A. fib with RVR.  He was started on his metoprolol and Cardizem drip. Neurology has been consulted.  Failure for the recommendation is anticoagulation.  Patient unable to get MRI due to following mental left eye.    Assessment & Plan:   Principal Problem:   CVA (cerebral vascular accident) ([place]) Active Problems:   Generalized anxiety disorder   Chemotherapy-induced neuropathy ([place])   B12 deficiency   Atrial fibrillation with RVR (H[facility])   Hypokalemia   Prolonged QT interval  1-Acute PCA stroke with a small petechial hemorrhage: -Patient presented with left side discoordination and visual changes.  -CTA; :Subacute right PCA territorial infarct with probable small foci of petechial hemorrhage.  Possible focal occlusion of a P2 branch at the level of the ambient cistern, though the PCA is identified within the hypodense right occipital lobe. Small remote infarct in the right parietal lobe. -Patient also found to have A. fib with RVR. -Unable to proceed with MRI due to patient having foreign metal left eye. -Continue with the statins. -LDL:58 -A1C:5.8 -PT, OT consulted.  -Started on Eliquis per neurology.   2-New onset A. fib: Troponin not  significantly elevated. Home dose of Metoprolol  50 mg. Hold off Cardizem.  HR 55---change metoprolol to Metoprolol 100 mg  Headache: Setting of #1: As needed Tylenol Continue with vicodin.   Prolong QT: replete electrolytes.   Hypokalemia: Replete orally.   CKD stage IIIb: Getting baseline around 1.6--- 1.8. Worsening renal function up to 2.0.  Renal US negative for hydronephrosis.  Continue with IV fluids.  Place foley to rule out retention.  Strict I and o.   History of colon and prostate cancer, status posts induced peripheral neuropathy  B12 deficiency: B 12 low at 124. Started supplement. IM.    Estimated body mass index is 27.08 kg/m as calculated from the following:   Height as of this encounter: 6\' 1"  (1.854 m).   Weight as of this encounter: 93.1 kg.   DVT prophylaxis: SCD Code Status: Full code Family Communication: care discussed with wife who was at bedside.  Disposition Plan:  Status is: Inpatient  Remains inpatient appropriate because: stroke, a fib RR        Consultants:  Neurology  Procedures:  ECHO pending.  Antimicrobials:    Subjective: He is doing ok, report headaches. He had headaches earlier yesterday,.   Objective: Vitals:   02/28/21 2000 03/01/21 0000 03/01/21 0400 03/01/21 1236  BP: (!) 153/103 115/72 140/80 127/85  Pulse: 70 61 64   Resp: 18 15 16 20   Temp: 99.5 F (37.5 C) 98.9 F (37.2 C) 99 F (37.2 C) 97.8 F (36.6 C)  TempSrc: Oral Axillary Axillary Oral  SpO2: 100% 98% 97% 97%  Weight:   93.1 kg   Height:  Intake/Output Summary (Last 24 hours) at 03/01/2021 1408 Last data filed at 03/01/2021 0300 Gross per 24 hour  Intake 1597.06 ml  Output --  Net 1597.06 ml    Filed Weights   02/27/21 1949 03/01/21 0400  Weight: 89.2 kg 93.1 kg    Examination:  General exam: NAD Respiratory system: CTA Cardiovascular system: S 1, S 2 IRR Gastrointestinal system: BS present, soft, nt Central nervous  system: Alert, moves 4 extremities. extremities. Left upper and lower extremity loss of coordination.  Extremities: No edema   Data Reviewed: I have personally reviewed following labs and imaging studies  CBC: Recent Labs  Lab 02/27/21 0937 02/27/21 1945 03/01/21 0033  WBC 10.4 10.2 10.3  NEUTROABS 7.6  --   --   HGB 13.4 13.6 10.9*  HCT 41.6 43.8 33.7*  MCV 89.8 89.9 89.9  PLT 294 306 956    Basic Metabolic Panel: Recent Labs  Lab 02/27/21 0937 02/27/21 1945 02/28/21 0027 03/01/21 0033  NA 142  --  137 136  K 3.2*  --  3.2* 3.2*  CL 107  --  107 107  CO2 22  --  23 22  GLUCOSE 97  --  119* 118*  BUN 12  --  9 19  CREATININE 1.63* 1.60* 1.60* 2.05*  CALCIUM 9.9  --  8.8* 8.3*  MG 2.1  --  2.3  --     GFR: Estimated Creatinine Clearance: 34.1 mL/min (A) (by C-G formula based on SCr of 2.05 mg/dL (H)). Liver Function Tests: Recent Labs  Lab 02/27/21 0937  AST 24  ALT 13  ALKPHOS 76  BILITOT 0.6  PROT 8.4*  ALBUMIN 4.7    No results for input(s): LIPASE, AMYLASE in the last 168 hours. No results for input(s): AMMONIA in the last 168 hours. Coagulation Profile: Recent Labs  Lab 02/27/21 0937  INR 1.0    Cardiac Enzymes: No results for input(s): CKTOTAL, CKMB, CKMBINDEX, TROPONINI in the last 168 hours. BNP (last 3 results) No results for input(s): PROBNP in the last 8760 hours. HbA1C: Recent Labs    02/28/21 0027  HGBA1C 5.4    CBG: Recent Labs  Lab 02/27/21 0942 03/01/21 1241  GLUCAP 102* 87    Lipid Profile: Recent Labs    02/28/21 0027  CHOL 119  HDL 36*  LDLCALC 58  TRIG 126  CHOLHDL 3.3    Thyroid Function Tests: No results for input(s): TSH, T4TOTAL, FREET4, T3FREE, THYROIDAB in the last 72 hours. Anemia Panel: Recent Labs    02/28/21 0027  VITAMINB12 124*    Sepsis Labs: No results for input(s): PROCALCITON, LATICACIDVEN in the last 168 hours.  Recent Results (from the past 240 hour(s))  Resp Panel by RT-PCR  (Flu A&B, Covid) Nasopharyngeal Swab     Status: None   Collection Time: 02/27/21  9:47 AM   Specimen: Nasopharyngeal Swab; Nasopharyngeal(NP) swabs in vial transport medium  Result Value Ref Range Status   SARS Coronavirus 2 by RT PCR NEGATIVE NEGATIVE Final    Comment: (NOTE) SARS-CoV-2 target nucleic acids are NOT DETECTED.  The SARS-CoV-2 RNA is generally detectable in upper respiratory specimens during the acute phase of infection. The lowest concentration of SARS-CoV-2 viral copies this assay can detect is 138 copies/mL. A negative result does not preclude SARS-Cov-2 infection and should not be used as the sole basis for treatment or other patient management decisions. A negative result may occur with  improper specimen collection/handling, submission of specimen other than nasopharyngeal  swab, presence of viral mutation(s) within the areas targeted by this assay, and inadequate number of viral copies(<138 copies/mL). A negative result must be combined with clinical observations, patient history, and epidemiological information. The expected result is Negative.  Fact Sheet for Patients:  EntrepreneurPulse.com.au  Fact Sheet for Healthcare Providers:  IncredibleEmployment.be  This test is no t yet approved or cleared by the [place] FDA and  has been authorized for detection and/or diagnosis of SARS-CoV-2 by FDA under an Emergency Use Authorization (EUA). This EUA will remain  in effect (meaning this test can be used) for the duration of the COVID-19 declaration under Section 564(b)(1) of the Act, 21 U.S.C.section 360bbb-3(b)(1), unless the authorization is terminated  or revoked sooner.       Influenza A by PCR NEGATIVE NEGATIVE Final   Influenza B by PCR NEGATIVE NEGATIVE Final    Comment: (NOTE) The Xpert Xpress SARS-CoV-2/FLU/RSV plus assay is intended as an aid in the diagnosis of influenza from Nasopharyngeal swab specimens  and should not be used as a sole basis for treatment. Nasal washings and aspirates are unacceptable for Xpert Xpress SARS-CoV-2/FLU/RSV testing.  Fact Sheet for Patients: EntrepreneurPulse.com.au  Fact Sheet for Healthcare Providers: IncredibleEmployment.be  This test is not yet approved or cleared by the [place] FDA and has been authorized for detection and/or diagnosis of SARS-CoV-2 by FDA under an Emergency Use Authorization (EUA). This EUA will remain in effect (meaning this test can be used) for the duration of the COVID-19 declaration under Section 564(b)(1) of the Act, 21 U.S.C. section 360bbb-3(b)(1), unless the authorization is terminated or revoked.  Performed at [facility], [address], [place], [place] [address]           Radiology Studies: US RENAL  Result Date: 03/01/2021 CLINICAL DATA:  AK I EXAM: RENAL / URINARY TRACT ULTRASOUND COMPLETE COMPARISON:  Renal ultrasound 05/27/2020 FINDINGS: Right Kidney: Renal measurements: 10.0 x 5.0 x 4.2 cm = volume: 108 mL. Echogenicity within normal limits. No mass or hydronephrosis visualized. Left Kidney: Renal measurements: 9.2 x 4.9 x 5.2 cm = volume: 123 mL. Echogenicity within normal limits. No mass or hydronephrosis visualized. Bladder: Appears normal for degree of bladder distention. Other: None. IMPRESSION: Unremarkable sonographic appearance of the bilateral kidneys. Electronically Signed   By: Audie Pinto M.D.   On: 03/01/2021 11:28   ECHOCARDIOGRAM COMPLETE  Result Date: 02/28/2021    ECHOCARDIOGRAM REPORT   Patient Name:   George [name]. Date of Exam: 02/28/2021 Medical Rec #:  [id number]         Height:       73.0 in Accession #:    [id number]        Weight:       196.6 lb Date of Birth:  26-Jul-1945         BSA:          2.136 m Patient Age:    38 years          BP:           143/93 mmHg Patient Gender: M                 HR:           89  bpm. Exam Location:  Inpatient Procedure: 2D Echo Indications:    stroke  History:        Patient has no prior history of Echocardiogram examinations.  Arrythmias:Atrial Fibrillation.  Sonographer:    Johny Chess RDCS Referring Phys: [id number] [place]  1. Pt in atrial fibrillation at time of study.  2. Left ventricular ejection fraction, by estimation, is 60 to 65%. The left ventricle has normal function. The left ventricle has no regional wall motion abnormalities. Left ventricular diastolic function could not be evaluated.  3. Right ventricular systolic function is normal. The right ventricular size is normal.  4. The mitral valve is normal in structure. Trivial mitral valve regurgitation. No evidence of mitral stenosis.  5. The aortic valve is abnormal. Aortic valve regurgitation is not visualized. Aortic valve sclerosis is present, with no evidence of aortic valve stenosis. Comparison(s): No prior Echocardiogram. FINDINGS  Left Ventricle: Left ventricular ejection fraction, by estimation, is 60 to 65%. The left ventricle has normal function. The left ventricle has no regional wall motion abnormalities. The left ventricular internal cavity size was normal in size. There is  no left ventricular hypertrophy. Left ventricular diastolic function could not be evaluated due to atrial fibrillation. Left ventricular diastolic function could not be evaluated. Right Ventricle: The right ventricular size is normal. Right ventricular systolic function is normal. Left Atrium: Left atrial size was normal in size. Right Atrium: Right atrial size was normal in size. Pericardium: There is no evidence of pericardial effusion. Mitral Valve: The mitral valve is normal in structure. Trivial mitral valve regurgitation. No evidence of mitral valve stenosis. Tricuspid Valve: The tricuspid valve is normal in structure. Tricuspid valve regurgitation is mild . No evidence of tricuspid stenosis.  Aortic Valve: The aortic valve is abnormal. Aortic valve regurgitation is not visualized. Aortic valve sclerosis is present, with no evidence of aortic valve stenosis. Pulmonic Valve: The pulmonic valve was normal in structure. Pulmonic valve regurgitation is trivial. No evidence of pulmonic stenosis. Aorta: The aortic root is normal in size and structure. Venous: The inferior vena cava was not well visualized. IAS/Shunts: The interatrial septum was not well visualized. Additional Comments: Pt in atrial fibrillation at time of study.  LEFT VENTRICLE PLAX 2D LVIDd:         4.80 cm LVIDs:         3.00 cm LV PW:         0.90 cm LV IVS:        0.80 cm LVOT diam:     1.90 cm LVOT Area:     2.84 cm  RIGHT VENTRICLE RV S prime:     10.80 cm/s TAPSE (M-mode): 1.8 cm LEFT ATRIUM             Index        RIGHT ATRIUM           Index LA diam:        4.00 cm 1.87 cm/m   RA Area:     13.50 cm LA Vol (A2C):   55.3 ml 25.89 ml/m  RA Volume:   31.40 ml  14.70 ml/m LA Vol (A4C):   53.8 ml 25.18 ml/m LA Biplane Vol: 54.6 ml 25.56 ml/m   AORTA Ao Root diam: 2.90 cm Ao Asc diam:  3.40 cm TRICUSPID VALVE TR Peak grad:   18.1 mmHg TR Vmax:        213.00 cm/s  SHUNTS Systemic Diam: 1.90 cm Kirk Ruths MD Electronically signed by Kirk Ruths MD Signature Date/Time: 02/28/2021/4:19:34 PM    Final         Scheduled Meds:  apixaban  5 mg Oral BID  aspirin EC  81 mg Oral Daily   atorvastatin  80 mg Oral Daily   buPROPion  450 mg Oral q morning   Chlorhexidine Gluconate Cloth  6 each Topical Daily   cyanocobalamin  1,000 mcg Intramuscular Daily   metoprolol succinate  100 mg Oral Daily   [START ON 03/07/2021] vitamin B-12  1,000 mcg Oral Daily   Vitamin D (Ergocalciferol)  50,000 Units Oral Q7 days   Continuous Infusions:  sodium chloride 75 mL/hr at 03/01/21 0813     LOS: 2 days    Time spent: 35 minutes.     Elmarie Shiley, MD [facility]   If 7PM-7AM, please contact  night-coverage [email or web address]  03/01/2021, 2:08 PM

## 2021-03-01 NOTE — Progress Notes (Signed)
Physical Therapy Treatment Patient Details Name: George [name]. MRN: [id number] DOB: 1945-12-30 Today's Date: 03/01/2021   History of Present Illness 75 y.o. male presents to [facility] hospital on 02/27/2021 with HA and L weakness. L hemianopsia also noted in ED. CT head revealed subacute R PCA infarct with small foci of petechial hemorrhage, as well as small infarct in parietal lobe. PMH includes colon cancer s/p resection, prostate cancer, anxiety/depression, osteoarthritis of bilateral knees.    PT Comments    Pt tolerates treatment well, progressing to transfer training and early gait. Pt continues to demonstrate impaired coordination of L side as well as prominent L neglect. Pt does show awareness of imbalance, however continues to have reduced recall of neglect. Pt will benefit from continued aggressive mobilization to reduce falls risk and to aide in a return to independence.   Recommendations for follow up therapy are one component of a multi-disciplinary discharge planning process, led by the attending physician.  Recommendations may be updated based on patient status, additional functional criteria and insurance authorization.  Follow Up Recommendations  Acute inpatient rehab (3hours/day)     Assistance Recommended at Discharge Frequent or constant Supervision/Assistance  Equipment Recommendations   (TBD)    Recommendations for Other Services       Precautions / Restrictions Precautions Precautions: Fall;Other (comment) Precaution Comments: L inattention Restrictions Weight Bearing Restrictions: No     Mobility  Bed Mobility Overal bed mobility: Needs Assistance Bed Mobility: Supine to Sit     Supine to sit: Supervision          Transfers Overall transfer level: Needs assistance Equipment used: 1 person hand held assist Transfers: Sit to/from Stand;Bed to chair/wheelchair/BSC Sit to Stand: Mod assist     Step pivot transfers: Mod assist     General  transfer comment: pt with impaired coordination of L side, also with L neglect, unaware of location of chair without PT cues    Ambulation/Gait Ambulation/Gait assistance: Mod assist Gait Distance (Feet): 5 Feet Assistive device: 1 person hand held assist Gait Pattern/deviations: Step-to pattern;Ataxic Gait velocity: reduced Gait velocity interpretation: <1.31 ft/sec, indicative of household ambulator   General Gait Details: pt with slowed step-to gait, reduced step length bilaterally, pt with ataxia of LLE   Stairs             Wheelchair Mobility    Modified Rankin (Stroke Patients Only) Modified Rankin (Stroke Patients Only) Pre-Morbid Rankin Score: No symptoms Modified Rankin: Moderately severe disability     Balance Overall balance assessment: Needs assistance Sitting-balance support: Feet supported;Single extremity supported Sitting balance-Leahy Scale: Poor Sitting balance - Comments: reliant on UE support, otherwise posterior lean Postural control: Posterior lean Standing balance support: Single extremity supported;Bilateral upper extremity supported Standing balance-Leahy Scale: Poor Standing balance comment: minA with UE support of PT                            Cognition Arousal/Alertness: Awake/alert Behavior During Therapy: WFL for tasks assessed/performed Overall Cognitive Status: Impaired/Different from baseline Area of Impairment: Awareness;Safety/judgement                         Safety/Judgement: Decreased awareness of deficits Awareness: Emergent            Exercises      General Comments General comments (skin integrity, edema, etc.): VSS on RA, spouse present, PT encourages family/visitors to sit on left side of pt  to encourage L awareness. PT positions pt so that pt has to look left to watch football game      Pertinent Vitals/Pain Pain Assessment: No/denies pain    Home Living                           Prior Function            PT Goals (current goals can now be found in the care plan section) Acute Rehab PT Goals Patient Stated Goal: to return to independence Progress towards PT goals: Progressing toward goals    Frequency    Min 4X/week      PT Plan Current plan remains appropriate    Co-evaluation              AM-PAC PT "6 Clicks" Mobility   Outcome Measure  Help needed turning from your back to your side while in a flat bed without using bedrails?: A Little Help needed moving from lying on your back to sitting on the side of a flat bed without using bedrails?: A Little Help needed moving to and from a bed to a chair (including a wheelchair)?: A Lot Help needed standing up from a chair using your arms (e.g., wheelchair or bedside chair)?: A Lot Help needed to walk in hospital room?: A Lot Help needed climbing 3-5 steps with a railing? : Total 6 Click Score: 13    End of Session   Activity Tolerance: Patient tolerated treatment well Patient left: in chair;with call bell/phone within reach;with chair alarm set;with family/visitor present Nurse Communication: Mobility status PT Visit Diagnosis: Other abnormalities of gait and mobility (R26.89);Other symptoms and signs involving the nervous system (R29.898)     Time: 1225-1301 PT Time Calculation (min) (ACUTE ONLY): 36 min  Charges:  $Gait Training: 8-22 mins $Therapeutic Activity: 8-22 mins                     Zenaida Niece, PT, DPT Acute Rehabilitation Pager: [phone] Office [place] 03/01/2021, 1:09 PM

## 2021-03-02 DIAGNOSIS — N179 Acute kidney failure, unspecified: Secondary | ICD-10-CM | POA: Diagnosis not present

## 2021-03-02 LAB — BASIC METABOLIC PANEL
Anion gap: 10 (ref 5–15)
BUN: 15 mg/dL (ref 8–23)
CO2: 21 mmol/L — ABNORMAL LOW (ref 22–32)
Calcium: 8.7 mg/dL — ABNORMAL LOW (ref 8.9–10.3)
Chloride: 109 mmol/L (ref 98–111)
Creatinine, Ser: 1.72 mg/dL — ABNORMAL HIGH (ref 0.61–1.24)
GFR, Estimated: 40 mL/min — ABNORMAL LOW (ref >60)
Glucose, Bld: 88 mg/dL (ref 70–99)
Potassium: 3.6 mmol/L (ref 3.5–5.1)
Sodium: 140 mmol/L (ref 135–145)

## 2021-03-02 LAB — CBC
HCT: 36 % — ABNORMAL LOW (ref 39.0–52.0)
Hemoglobin: 11.6 g/dL — ABNORMAL LOW (ref 13.0–17.0)
MCH: 29.5 pg (ref 26.0–34.0)
MCHC: 32.2 g/dL (ref 30.0–36.0)
MCV: 91.6 fL (ref 80.0–100.0)
Platelets: 249 10*3/uL (ref 150–400)
RBC: 3.93 MIL/uL — ABNORMAL LOW (ref 4.22–5.81)
RDW: 13.9 % (ref 11.5–15.5)
WBC: 10.4 10*3/uL (ref 4.0–10.5)
nRBC: 0 % (ref 0.0–0.2)

## 2021-03-02 LAB — URINE CULTURE: Culture: NO GROWTH

## 2021-03-02 NOTE — Discharge Instructions (Signed)

## 2021-03-02 NOTE — Progress Notes (Signed)
Physical Therapy Treatment Patient Details Name: George [name]. MRN: [id number] DOB: 14-Jul-1945 Today's Date: 03/02/2021   History of Present Illness 75 y.o. male presents to [facility] hospital on 02/27/2021 with HA and L weakness. L hemianopsia also noted in ED. CT head revealed subacute R PCA infarct with small foci of petechial hemorrhage, as well as small infarct in parietal lobe. PMH includes colon cancer s/p resection, prostate cancer, anxiety/depression, osteoarthritis of bilateral knees.    PT Comments    Patient with better awareness of deficits and able to increase ambulation distance with 2 person assist using RW. He continues with ataxic movements of left side.    Recommendations for follow up therapy are one component of a multi-disciplinary discharge planning process, led by the attending physician.  Recommendations may be updated based on patient status, additional functional criteria and insurance authorization.  Follow Up Recommendations  Acute inpatient rehab (3hours/day)     Assistance Recommended at Discharge Frequent or constant Supervision/Assistance  Equipment Recommendations   (TBD)    Recommendations for Other Services       Precautions / Restrictions Precautions Precautions: Fall;Other (comment) Precaution Comments: L inattention     Mobility  Bed Mobility Overal bed mobility: Needs Assistance Bed Mobility: Supine to Sit     Supine to sit: Supervision     General bed mobility comments: pt leaving LUE behind due to poor awareness,    Transfers Overall transfer level: Needs assistance Equipment used: Rolling walker (2 wheels) Transfers: Sit to/from Stand;Bed to chair/wheelchair/BSC Sit to Stand: Mod assist;+2 physical assistance           General transfer comment: from EOB +2 mod assist with LUE placed up on RW to improve awarenss of LUE; able to maintain grip with left hand; repeated x 3 reps    Ambulation/Gait Ambulation/Gait assistance:  Min assist;+2 safety/equipment Gait Distance (Feet): 10 Feet (seated rest; 35) Assistive device: Rolling walker (2 wheels) Gait Pattern/deviations: Step-to pattern;Ataxic Gait velocity: reduced     General Gait Details: pt with slowed step-to gait, reduced step length bilaterally, pt with ataxia of LLE   Stairs             Wheelchair Mobility    Modified Rankin (Stroke Patients Only) Modified Rankin (Stroke Patients Only) Pre-Morbid Rankin Score: No symptoms Modified Rankin: Moderately severe disability     Balance Overall balance assessment: Needs assistance Sitting-balance support: Feet supported Sitting balance-Leahy Scale: Fair     Standing balance support: Bilateral upper extremity supported Standing balance-Leahy Scale: Poor Standing balance comment: minA with UE support of PT                            Cognition Arousal/Alertness: Awake/alert Behavior During Therapy: WFL for tasks assessed/performed Overall Cognitive Status: Impaired/Different from baseline Area of Impairment: Awareness                         Safety/Judgement: Decreased awareness of deficits Awareness: Emergent Problem Solving: Requires verbal cues General Comments: pt able to initially name all his deficits EXCEPT visual; by end of session, stated he also thought his vision was off on the left; only slightly impulsive, but redirectable        Exercises      General Comments        Pertinent Vitals/Pain Pain Assessment: No/denies pain Faces Pain Scale: No hurt    Home Living  Prior Function            PT Goals (current goals can now be found in the care plan section) Acute Rehab PT Goals Patient Stated Goal: to return to independence Time For Goal Achievement: 03/14/21 Potential to Achieve Goals: Fair Progress towards PT goals: Progressing toward goals    Frequency    Min 4X/week      PT Plan Current  plan remains appropriate    Co-evaluation              AM-PAC PT "6 Clicks" Mobility   Outcome Measure  Help needed turning from your back to your side while in a flat bed without using bedrails?: A Little Help needed moving from lying on your back to sitting on the side of a flat bed without using bedrails?: A Little Help needed moving to and from a bed to a chair (including a wheelchair)?: A Lot Help needed standing up from a chair using your arms (e.g., wheelchair or bedside chair)?: A Lot Help needed to walk in hospital room?: Total Help needed climbing 3-5 steps with a railing? : Total 6 Click Score: 12    End of Session Equipment Utilized During Treatment: Gait belt Activity Tolerance: Patient tolerated treatment well Patient left: in chair;with call bell/phone within reach;with chair alarm set;with family/visitor present Nurse Communication: Mobility status PT Visit Diagnosis: Other abnormalities of gait and mobility (R26.89);Other symptoms and signs involving the nervous system (K95.747)     Time: 3403-7096 PT Time Calculation (min) (ACUTE ONLY): 29 min  Charges:  $Gait Training: 23-37 mins                      Arby Barrette, PT [place]  Pager [phone] Office [phone]    [name] 03/02/2021, 1:02 PM

## 2021-03-02 NOTE — Care Management Important Message (Signed)
Important Message  Patient Details  Name: George [name]. MRN: [id number] Date of Birth: 11-Jun-1945   Medicare Important Message Given:  Yes     Derico Mitton 03/02/2021, 2:24 PM

## 2021-03-02 NOTE — Progress Notes (Signed)
Occupational Therapy Treatment Patient Details Name: George [name]. MRN: [id number] DOB: Aug 20, 1945 Today's Date: 03/02/2021   History of present illness 75 y.o. male presents to [facility] hospital on 02/27/2021 with HA and L weakness. L hemianopsia also noted in ED. CT head revealed subacute R PCA infarct with small foci of petechial hemorrhage, as well as small infarct in parietal lobe. PMH includes colon cancer s/p resection, prostate cancer, anxiety/depression, osteoarthritis of bilateral knees.   OT comments  [name] is progressing well towards his goals. He declined OOB activity this session but agreeable to bed/Eob activity. Session focused on attending to and locating items to the L of him. Pt required increased time and verbal cues to scan environment. Educated on scanning patterns and he demonstrated great understanding. Pt also required increased time and min A to obtain items in L environment with LUE. Pt verbalized great awareness of his deficits. He continues to benefit from OT acutely. D/c recommendation remains appropriate.    Recommendations for follow up therapy are one component of a multi-disciplinary discharge planning process, led by the attending physician.  Recommendations may be updated based on patient status, additional functional criteria and insurance authorization.    Follow Up Recommendations  Acute inpatient rehab (3hours/day)    Assistance Recommended at Discharge Frequent or constant Supervision/Assistance  Equipment Recommendations  BSC/3in1;Other (comment)    Recommendations for Other Services Rehab consult    Precautions / Restrictions Precautions Precautions: Fall;Other (comment) Precaution Comments: L inattention Restrictions Weight Bearing Restrictions: No       Mobility Bed Mobility Overal bed mobility: Needs Assistance Bed Mobility: Supine to Sit     Supine to sit: Supervision     General bed mobility comments: verbal cues for LUE     Transfers Overall transfer level: Needs assistance Equipment used: Rolling walker (2 wheels) Transfers: Sit to/from Stand;Bed to chair/wheelchair/BSC Sit to Stand: Mod assist;+2 physical assistance           General transfer comment: pt declined OOB this session     Balance Overall balance assessment: Needs assistance Sitting-balance support: Feet supported Sitting balance-Leahy Scale: Fair     Standing balance support: Bilateral upper extremity supported Standing balance-Leahy Scale: Poor Standing balance comment: minA with UE support of PT                           ADL either performed or assessed with clinical judgement   ADL Overall ADL's : Needs assistance/impaired                                       General ADL Comments: pt delined ADLs this session. Spent attending to and locating and obtaining items to his left    Extremity/Trunk Assessment Upper Extremity Assessment RUE Deficits / Details: strength 4/5, coordination deficits L > R RUE Coordination: decreased fine motor;decreased gross motor LUE Deficits / Details: strength 4/5, coordination deficits L > R LUE Sensation: decreased proprioception LUE Coordination: decreased fine motor;decreased gross motor   Lower Extremity Assessment Lower Extremity Assessment: Defer to PT evaluation        Vision   Vision Assessment?: Yes;Vision impaired- to be further tested in functional context Eye Alignment: Impaired (comment) Ocular Range of Motion: Restricted on the left Alignment/Gaze Preference: Gaze right;Head turned Tracking/Visual Pursuits: Impaired - to be further tested in functional context Saccades: Overshoots;Undershoots Visual Fields: Impaired-to be  further tested in functional context;Left visual field deficit Additional Comments: pt able to locate items to his L this session with incrsaed time and verbal cues   Perception     Praxis      Cognition  Arousal/Alertness: Awake/alert Behavior During Therapy: WFL for tasks assessed/performed Overall Cognitive Status: Impaired/Different from baseline Area of Impairment: Awareness                   Current Attention Level: Sustained Memory: Decreased recall of precautions   Safety/Judgement: Decreased awareness of deficits Awareness: Emergent Problem Solving: Requires verbal cues General Comments: Pt with improved awareness of deficits stating that his L side is incoordinated and weak and he has trouble "paying attention" to the L side. Pt also admitted that he is at risk for falls.                General Comments VSS on RA    Pertinent Vitals/ Pain       Pain Assessment: No/denies pain Faces Pain Scale: No hurt         Frequency  Min 2X/week        Progress Toward Goals  OT Goals(current goals can now be found in the care plan section)     Acute Rehab OT Goals Patient Stated Goal: get some rest OT Goal Formulation: With patient Time For Goal Achievement: 03/14/21 Potential to Achieve Goals: Good ADL Goals Pt Will Perform Grooming: with min guard assist;standing Pt Will Perform Lower Body Bathing: with min guard assist;sit to/from stand Pt Will Perform Lower Body Dressing: with min guard assist;sit to/from stand Pt Will Transfer to Toilet: with min guard assist;stand pivot transfer;bedside commode  Plan Discharge plan remains appropriate       AM-PAC OT "6 Clicks" Daily Activity     Outcome Measure   Help from another person eating meals?: A Little Help from another person taking care of personal grooming?: A Little Help from another person toileting, which includes using toliet, bedpan, or urinal?: A Lot Help from another person bathing (including washing, rinsing, drying)?: A Lot Help from another person to put on and taking off regular upper body clothing?: A Little Help from another person to put on and taking off regular lower body clothing?: A  Lot 6 Click Score: 15    End of Session    OT Visit Diagnosis: Unsteadiness on feet (R26.81);Other abnormalities of gait and mobility (R26.89);Muscle weakness (generalized) (M62.81);Other symptoms and signs involving cognitive function;Hemiplegia and hemiparesis Hemiplegia - Right/Left: Left Hemiplegia - dominant/non-dominant: Non-Dominant Hemiplegia - caused by: Nontraumatic intracerebral hemorrhage   Activity Tolerance Patient limited by fatigue   Patient Left in bed;with call bell/phone within reach;with bed alarm set   Nurse Communication Mobility status        Time: 2536-6440 OT Time Calculation (min): 20 min  Charges: OT General Charges $OT Visit: 1 Visit OT Treatments $Therapeutic Activity: 8-22 mins   Reese Stockman A Bilaal Leib 03/02/2021, 4:51 PM

## 2021-03-02 NOTE — Progress Notes (Signed)
Inpatient Rehab Admissions Coordinator:   Met with patient and his family (wife and daughter) at bedside to discuss CIR recommendations.  I reviewed 3 hrs/day of therapy, physician follow up, and average length of stay to be about 2 weeks.  Pt will have full support from his wife at discharge.  I reviewed Medicare coverage with pt and family.  Will follow for admission pending bed availability.   Shann Medal, PT, DPT Admissions Coordinator [phone] 03/02/21  3:08 PM

## 2021-03-02 NOTE — PMR Pre-admission (Signed)
PMR Admission Coordinator Pre-Admission Assessment  Patient: George [name]. is an 75 y.o., male MRN: [id number] DOB: 09-03-1945 Height: _0  (185.4 cm) Weight: 93.1 kg  Insurance Information HMO:     PPO:      PCP:      IPA:      80/20:      OTHER:  PRIMARY: Medicare A/B      Policy#: [id number]      Subscriber: pt CM Name:       Phone#:      Fax#:  Pre-Cert#: verified Civil engineer, contracting:  Benefits:  Phone #:      Name:  Eff. Date: 10/10/2008 A/B     Deduct: $1556      Out of Pocket Max: n/a      Life Max: n/a CIR: 100%      SNF: 20 full days Outpatient: 80%     Co-Ins: 20% Home Health: 100%      Co-Pay:  DME: 80%     Co-Ins: 20% Providers:  SECONDARY: Generic Commercial      Policy#: [id number]     Phone#: [phone]  Financial Counselor:       Phone#:   The "Data Collection Information Summary" for patients in Inpatient Rehabilitation Facilities with attached "Privacy Act [place] Records" was provided and verbally reviewed with: Patient and Family  Emergency Contact Information Contact Information     Name Relation Home Work Mobile   [place] Spouse [phone]     [place],Queenie Daughter [phone]         Current Medical History  Patient Admitting Diagnosis: R CVA  History of Present Illness: George, [name]. is a 75 year old right-handed male with history of colon cancer status postresection 2009 with chemotherapy, chemotherapy-induced peripheral neuropathy, prostate cancer with TURP 2016, anxiety/depression, hypertension, quit smoking 48 years ago, osteoarthritis bilateral knees status post TKA.   Presented 02/27/2021 med [facility] with acute onset of left-sided weakness as well as headache .  CT angiogram head and neck showed subacute right PCA territory infarct with probable small foci of petechial hemorrhage.  Possible focal occlusion of P2 branch at the level of the ambient cistern.  Small remote infarct right parietal lobe.   Patient did not receive tPA.  Admission chemistry unremarkable except creatinine 1.63-2.05, potassium 3.2, troponin 27, urinalysis negative nitrite.  Patient subsequently developed atrial fibrillation with RVR.  He was started on metoprolol as well as Cardizem drip.  Echocardiogram with ejection fraction of 60 to 65% no wall motion abnormalities.  Neurology follow-up maintained on Eliquis for CVA prophylaxis as well as atrial fibrillation.  AKI with creatinine 1.63 renal ultrasound negative for hydronephrosis initially maintained on IV fluids with latest creatinine 1.62.  He is tolerating a regular diet.  Therapy evaluations completed due to patient's left-sided weakness and decreased functional mobility was recommended for a comprehensive rehab program.  Complete NIHSS TOTAL: 6  Patient's medical record from [facility] has been reviewed by the rehabilitation admission coordinator and physician.  Past Medical History  Past Medical History:  Diagnosis Date   Anxiety    Arthritis    OSTEO IN KNEE   Asthma    as child   Colon cancer ([place])    Depression    Elevated prostate specific antigen (PSA)    Generalized anxiety disorder 04/09/2015   Hematospermia    Hx antineoplastic chemotherapy 2010   Hypertension    Idiopathic peripheral neuropathy    TOES OF BOTH  FEET DUE TO CHEMO   Incomplete bladder emptying    Malignant neoplasm of prostate (HCC)    Nodular prostate with lower urinary tract symptoms    Panic attacks 04/09/2015   Primary localized osteoarthrosis of the knee, right 05/19/2016   Prostatitis    S/P total knee arthroplasty, left 05/19/2016   Spermatocele    Weak urinary stream     Has the patient had major surgery during 100 days prior to admission? No  Family History   family history includes Cancer in his brother; Stroke in his father.  Current Medications  Current Facility-Administered Medications:    acetaminophen (TYLENOL) tablet 650 mg, 650 mg, Oral, Q4H PRN, 650 mg  at 03/01/21 2012 **OR** acetaminophen (TYLENOL) 160 MG/5ML solution 650 mg, 650 mg, Per Tube, Q4H PRN **OR** acetaminophen (TYLENOL) suppository 650 mg, 650 mg, Rectal, Q4H PRN, Earnest Conroy, Neelima, MD   apixaban (ELIQUIS) tablet 5 mg, 5 mg, Oral, BID, Regalado, Belkys A, MD, 5 mg at 03/04/21 0830   atorvastatin (LIPITOR) tablet 80 mg, 80 mg, Oral, Daily, Kamineni, Neelima, MD, 80 mg at 03/04/21 0830   buPROPion (WELLBUTRIN XL) 24 hr tablet 450 mg, 450 mg, Oral, q morning, Kamineni, Neelima, MD, 450 mg at 03/04/21 2800   Chlorhexidine Gluconate Cloth 2 % PADS 6 each, 6 each, Topical, Daily, Regalado, Belkys A, MD, 6 each at 03/03/21 0859   cyanocobalamin ((VITAMIN B-12)) injection 1,000 mcg, 1,000 mcg, Intramuscular, Daily, Regalado, Belkys A, MD, 1,000 mcg at 03/04/21 0830   HYDROcodone-acetaminophen (NORCO/VICODIN) 5-325 MG per tablet 1 tablet, 1 tablet, Oral, Q6H PRN, Guilford Shi, MD, 1 tablet at 03/03/21 0955   hydrOXYzine (ATARAX/VISTARIL) tablet 10 mg, 10 mg, Oral, TID PRN, Guilford Shi, MD, 10 mg at 03/04/21 0829   metoprolol succinate (TOPROL-XL) 24 hr tablet 125 mg, 125 mg, Oral, Daily, Regalado, Belkys A, MD, 125 mg at 03/04/21 0829   metoprolol tartrate (LOPRESSOR) injection 2.5 mg, 2.5 mg, Intravenous, Q6H PRN, Regalado, Belkys A, MD, 2.5 mg at 03/03/21 1050   potassium chloride SA (KLOR-CON) CR tablet 40 mEq, 40 mEq, Oral, Q4H, Gonfa, Taye T, MD, 40 mEq at 03/04/21 0830   [START ON 03/07/2021] vitamin B-12 (CYANOCOBALAMIN) tablet 1,000 mcg, 1,000 mcg, Oral, Daily, Regalado, Belkys A, MD   Vitamin D (Ergocalciferol) (DRISDOL) capsule 50,000 Units, 50,000 Units, Oral, Q7 days, Guil[place] Shi, MD, 50,000 Units at 02/27/21 2050  Patients Current Diet:  Diet Order             Diet Heart Room service appropriate? Yes; Fluid consistency: Thin  Diet effective now                   Precautions / Restrictions Precautions Precautions: Fall, Other (comment) Precaution  Comments: L inattention Restrictions Weight Bearing Restrictions: No   Has the patient had 2 or more falls or a fall with injury in the past year? No  Prior Activity Level Community (5-7x/wk): independent prior to admit with no device, driving, managing all ADLs and iADLs  Prior Functional Level Self Care: Did the patient need help bathing, dressing, using the toilet or eating? Independent  Indoor Mobility: Did the patient need assistance with walking from room to room (with or without device)? Independent  Stairs: Did the patient need assistance with internal or external stairs (with or without device)? Independent  Functional Cognition: Did the patient need help planning regular tasks such as shopping or remembering to take medications? Independent  Patient Information Are you of Hispanic, Latino/a,or [place]  origin?: A. No, not of Hispanic, Latino/a, or Spanish origin What is your race?: B. Black or African American Do you need or want an interpreter to communicate with a doctor or health care staff?: 0. No  Patient's Response To:  Health Literacy and Transportation Is the patient able to respond to health literacy and transportation needs?: Yes Health Literacy - How often do you need to have someone help you when you read instructions, pamphlets, or other written material from your doctor or pharmacy?: Never In the past 12 months, has lack of transportation kept you from medical appointments or from getting medications?: No In the past 12 months, has lack of transportation kept you from meetings, work, or from getting things needed for daily living?: No  Development worker, international aid / [place] Devices/Equipment: None Home Equipment: None  Prior Device Use: Indicate devices/aids used by the patient prior to current illness, exacerbation or injury? None of the above  Current Functional Level Cognition  Arousal/Alertness: Awake/alert Overall Cognitive Status:  Impaired/Different from baseline Current Attention Level: Sustained Orientation Level: Oriented X4 Following Commands: Follows one step commands with increased time Safety/Judgement: Decreased awareness of deficits General Comments: Pt with improved awareness of deficits stating that his L side is incoordinated and weak and he has trouble "paying attention" to the L side. Pt also admitted that he is at risk for falls. Memory: Appears intact (recalled 5/5 words after 2 minute delay) Problem Solving: Impaired Problem Solving Impairment: Verbal complex Executive Function: Self Monitoring Self Monitoring: Impaired Self Monitoring Impairment: Verbal complex Safety/Judgment: Appears intact    Extremity Assessment (includes Sensation/Coordination)  Upper Extremity Assessment: Defer to OT evaluation RUE Deficits / Details: strength 4/5, coordination deficits L > R RUE Coordination: decreased fine motor, decreased gross motor LUE Deficits / Details: strength 4/5, coordination deficits L > R LUE Sensation: decreased proprioception LUE Coordination: decreased fine motor, decreased gross motor  Lower Extremity Assessment: Defer to PT evaluation LLE Deficits / Details: 4+/5 strength grossly, ROM WFL. LLE Coordination: decreased gross motor    ADLs  Overall ADL's : Needs assistance/impaired Eating/Feeding: Minimal assistance, Sitting Grooming: Minimal assistance, Sitting Upper Body Bathing: Moderate assistance, Sitting Lower Body Bathing: Maximal assistance, +2 for physical assistance, +2 for safety/equipment, Sit to/from stand Upper Body Dressing : Moderate assistance, Sitting Lower Body Dressing: Maximal assistance, +2 for physical assistance, +2 for safety/equipment, Sit to/from stand Toilet Transfer: Maximal assistance, +2 for physical assistance, +2 for safety/equipment, Stand-pivot, BSC/3in1 Toilet Transfer Details (indicate cue type and reason): Max A x 2 for pivot to/from [facility] with manual  assist. On entry, pt nephew assisting pt to EOB for bathroom mobility - due to noted L sided deficits, encouraged use of BSC instead due to high fall risk Toileting- Clothing Manipulation and Hygiene: Maximal assistance, +2 for physical assistance, +2 for safety/equipment, Sit to/from stand Toileting - Clothing Manipulation Details (indicate cue type and reason): unable to void on BSC. +2 assist for clothing mgmt and balance General ADL Comments: pt delined ADLs this session. Spent attending to and locating and obtaining items to his left    Mobility  Overal bed mobility: Needs Assistance Bed Mobility: Supine to Sit Supine to sit: Supervision Sit to supine: Supervision General bed mobility comments: verbal cues for LLE and LUE as coming off the bed (leaving them behind)    Transfers  Overall transfer level: Needs assistance Equipment used: Rolling walker (2 wheels) Transfers: Sit to/from Stand Sit to Stand: Mod assist Bed to/from chair/wheelchair/BSC transfer type::  Step pivot Stand pivot transfers: Max assist, +2 physical assistance, +2 safety/equipment Step pivot transfers: Mod assist General transfer comment: dyscoordinated as trying to push off the bed and imbalance requiring mod assist to maintain balance and get LUE on RW    Ambulation / Gait / Stairs / Wheelchair Mobility  Ambulation/Gait Ambulation/Gait assistance: Herbalist (Feet): 30 Feet Assistive device: Rolling walker (2 wheels) Gait Pattern/deviations: Step-to pattern, Ataxic General Gait Details: pt with slowed step-to gait, reduced step length bilaterally, pt with ataxia of LLE Gait velocity: reduced Gait velocity interpretation: <1.31 ft/sec, indicative of household ambulator    Posture / Balance Dynamic Sitting Balance Sitting balance - Comments: reliant on UE support, otherwise posterior lean Balance Overall balance assessment: Needs assistance Sitting-balance support: Feet supported Sitting  balance-Leahy Scale: Fair Sitting balance - Comments: reliant on UE support, otherwise posterior lean Postural control: Posterior lean Standing balance support: Bilateral upper extremity supported Standing balance-Leahy Scale: Poor Standing balance comment: minA with UE support of PT    Special needs/care consideration N/a   Previous Home Environment (from acute therapy documentation) Living Arrangements: Spouse/significant other  Lives With: Spouse Available Help at Discharge: Family, Available 24 hours/day Type of Home: House Home Layout: One level Home Access: Level entry Bathroom Shower/Tub: Multimedia programmer: Standard Bathroom Accessibility: Yes How Accessible: Accessible via wheelchair Home Care Services: No  Discharge Living Setting Plans for Discharge Living Setting: Patient's home, Lives with (comment) (spouse) Type of Home at Discharge: House Discharge Home Layout: One level Discharge Home Access: Level entry Discharge Bathroom Shower/Tub: Walk-in shower Discharge Bathroom Toilet: Standard Discharge Bathroom Accessibility: Yes How Accessible: Accessible via wheelchair Does the patient have any problems obtaining your medications?: No  Social/Family/Support Systems Patient Roles: Spouse Anticipated Caregiver: spouse Denice Paradise) and daughter Gwendel Hanson) Anticipated Caregiver's Contact Information: Denice Paradise [phone] Ability/Limitations of Caregiver: n/a Caregiver Availability: 24/7 Discharge Plan Discussed with Primary Caregiver: Yes Is Caregiver In Agreement with Plan?: Yes Does Caregiver/Family have Issues with Lodging/Transportation while Pt is in Rehab?: No  Goals Patient/Family Goal for Rehab: PT/OT supervision to mod I, SLP supervision Expected length of stay: 13-15 days Pt/Family Agrees to Admission and willing to participate: Yes Program Orientation Provided & Reviewed with Pt/Caregiver Including Roles  & Responsibilities: Yes  Decrease burden of Care  through IP rehab admission: n/a  Possible need for SNF placement upon discharge: no  Patient Condition: I have reviewed medical records from [facility], spoken with CM, and patient, spouse, and daughter. I met with patient at the bedside for inpatient rehabilitation assessment.  Patient will benefit from ongoing PT, OT, and SLP, can actively participate in 3 hours of therapy a day 5 days of the week, and can make measurable gains during the admission.  Patient will also benefit from the coordinated team approach during an Inpatient Acute Rehabilitation admission.  The patient will receive intensive therapy as well as Rehabilitation physician, nursing, social worker, and care management interventions.  Due to safety, skin/wound care, disease management, medication administration, pain management, and patient education the patient requires 24 hour a day rehabilitation nursing.  The patient is currently mod to max assist +2 with mobility and basic ADLs.  Discharge setting and therapy post discharge at home with home health is anticipated.  Patient has agreed to participate in the Acute Inpatient Rehabilitation Program and will admit tomorrow.  Preadmission Screen Completed By:  Michel Santee, PT, DPT 03/04/2021 10:08 AM ______________________________________________________________________   Discussed status with Dr. Dagoberto Ligas on 03/04/21  at 10:08 AM  and received approval for admission 11/24.   Admission Coordinator:  Michel Santee, PT, DPT time 10:08 AM Sudie Grumbling 03/04/21    Assessment/Plan: Diagnosis: Does the need for close, 24 hr/day Medical supervision in concert with the patient's rehab needs make it unreasonable for this patient to be served in a less intensive setting? Yes Co-Morbidities requiring supervision/potential complications: Afib with RVR, R PCA stroke with L hemi and L hemianopsia; anxiety/depression; chemo based neuropathy Due to bladder management, bowel management, safety,  skin/wound care, disease management, medication administration, pain management, and patient education, does the patient require 24 hr/day rehab nursing? Yes Does the patient require coordinated care of a physician, rehab nurse, PT, OT, and SLP to address physical and functional deficits in the context of the above medical diagnosis(es)? Yes Addressing deficits in the following areas: balance, endurance, locomotion, strength, transferring, bowel/bladder control, bathing, dressing, feeding, grooming, toileting, cognition, and speech Can the patient actively participate in an intensive therapy program of at least 3 hrs of therapy 5 days a week? Yes The potential for patient to make measurable gains while on inpatient rehab is good Anticipated functional outcomes upon discharge from inpatient rehab: modified independent and supervision PT, modified independent and supervision OT, supervision SLP Estimated rehab length of stay to reach the above functional goals is: 13-15 days Anticipated discharge destination: Home 10. Overall Rehab/Functional Prognosis: good   MD Signature:

## 2021-03-02 NOTE — Progress Notes (Addendum)
PROGRESS NOTE    George [name].  HYQ:[id number] DOB: 1945/10/10 DOA: 02/27/2021 PCP: Charolette Forward, MD   Brief Narrative: 75 year old with past medical history significant for colon cancer status postresection, prostate cancer, anxiety/depression, osteoarthritis of  bilateral knees, who presented to the ED complaining of persistent legs and left-sided weakness since Thursday (11/17).  He has been feeling sick since Monday after he got the flu shot.   Evaluation in the ED patient was found to have left visual defect, hemianopsia not, left hemiparesis.  CT CTA head and neck revealed subacute right PCA territorial infarct with probably small foci of petechial hemorrhage.  Possible focal occlusion of P2 branch.  Patient subsequently developed A. fib with RVR.  He was started on his metoprolol and Cardizem drip. Neurology has been consulted.  Failure for the recommendation is anticoagulation.  Patient unable to get MRI due to following mental left eye.  Assessment & Plan:   Principal Problem:   CVA (cerebral vascular accident) ([place]) Active Problems:   Generalized anxiety disorder   Chemotherapy-induced neuropathy ([place])   B12 deficiency   Atrial fibrillation with RVR (HCC)   Hypokalemia   Prolonged QT interval  1-Acute PCA stroke with a small petechial hemorrhage: -Patient presented with left side discoordination and visual changes.  -CTA; :Subacute right PCA territorial infarct with probable small foci of petechial hemorrhage.  Possible focal occlusion of a P2 branch at the level of the ambient cistern, though the PCA is identified within the hypodense right occipital lobe. Small remote infarct in the right parietal lobe. -Patient also found to have A. fib with RVR. -Unable to proceed with MRI due to patient having foreign metal left eye. -Continue with the statins. -LDL:58 -A1C:5.8 -PT, OT consulted. Recommend CIR>  -Started on Eliquis per neurology.  -Stable.   2-New onset A.  fib: Troponin not significantly elevated. Home dose of Metoprolol  50 mg. Hold off Cardizem.  HR 55---change metoprolol to Metoprolol 100 mg Started on Eliquis.   Headache: Setting of #1: As needed Tylenol Continue with vicodin.  Improving.   Prolong QT: replete electrolytes.   Hypokalemia: Replaced.   AKI on CKD stage IIIb: Getting baseline around 1.6--- 1.8. Worsening renal function up to 2.0.  Renal US negative for hydronephrosis.  Continue with IV fluids.  Improved, cr down to 1.7.  History of colon and prostate cancer, status posts induced peripheral neuropathy  B12 deficiency: B 12 low at 124. Started supplement. IM.    Estimated body mass index is 27.08 kg/m as calculated from the following:   Height as of this encounter: 6\' 1"  (1.854 m).   Weight as of this encounter: 93.1 kg.   DVT prophylaxis: SCD Code Status: Full code Family Communication: care discussed with wife who was at bedside.  Disposition Plan:  Status is: Inpatient  Remains inpatient appropriate because: stroke, a fib RRR. Awaiting CIR eval.         Consultants:  Neurology  Procedures:  ECHO pending.  Antimicrobials:    Subjective: He is feeling well. Notice some numbness tingling toes, last couple days. No other worsening or new neuro deficit.  Headaches improving.   Objective: Vitals:   03/01/21 2300 03/02/21 0330 03/02/21 0817 03/02/21 1146  BP: (!) 146/82 (!) 147/91 (!) 140/95 122/69  Pulse: (!) 50 60 (!) 56 62  Resp: 15 (!) 21 17 19   Temp: 99 F (37.2 C) 98.9 F (37.2 C) 98.1 F (36.7 C) 98.1 F (36.7 C)  TempSrc: Oral  Oral  Oral  SpO2: 97% 98% 100% 98%  Weight:      Height:        Intake/Output Summary (Last 24 hours) at 03/02/2021 1236 Last data filed at 03/02/2021 1128 Gross per 24 hour  Intake --  Output 1000 ml  Net -1000 ml    Filed Weights   02/27/21 1949 03/01/21 0400  Weight: 89.2 kg 93.1 kg    Examination:  General exam: NAD Respiratory  system: CTA Cardiovascular system: S 1, S 2 IRR Gastrointestinal system: BS present, soft, nt Central nervous system: Alert, moves 4 extremities. extremities. Left upper and lower extremity loss of coordination. Neuro exam stable.  Extremities: no edema   Data Reviewed: I have personally reviewed following labs and imaging studies  CBC: Recent Labs  Lab 02/27/21 0937 02/27/21 1945 03/01/21 0033 03/02/21 0733  WBC 10.4 10.2 10.3 10.4  NEUTROABS 7.6  --   --   --   HGB 13.4 13.6 10.9* 11.6*  HCT 41.6 43.8 33.7* 36.0*  MCV 89.8 89.9 89.9 91.6  PLT 294 306 269 086    Basic Metabolic Panel: Recent Labs  Lab 02/27/21 0937 02/27/21 1945 02/28/21 0027 03/01/21 0033 03/02/21 0733  NA 142  --  137 136 140  K 3.2*  --  3.2* 3.2* 3.6  CL 107  --  107 107 109  CO2 22  --  23 22 21*  GLUCOSE 97  --  119* 118* 88  BUN 12  --  9 19 15   CREATININE 1.63* 1.60* 1.60* 2.05* 1.72*  CALCIUM 9.9  --  8.8* 8.3* 8.7*  MG 2.1  --  2.3  --   --     GFR: Estimated Creatinine Clearance: 40.6 mL/min (A) (by C-G formula based on SCr of 1.72 mg/dL (H)). Liver Function Tests: Recent Labs  Lab 02/27/21 0937  AST 24  ALT 13  ALKPHOS 76  BILITOT 0.6  PROT 8.4*  ALBUMIN 4.7    No results for input(s): LIPASE, AMYLASE in the last 168 hours. No results for input(s): AMMONIA in the last 168 hours. Coagulation Profile: Recent Labs  Lab 02/27/21 0937  INR 1.0    Cardiac Enzymes: No results for input(s): CKTOTAL, CKMB, CKMBINDEX, TROPONINI in the last 168 hours. BNP (last 3 results) No results for input(s): PROBNP in the last 8760 hours. HbA1C: Recent Labs    02/28/21 0027  HGBA1C 5.4    CBG: Recent Labs  Lab 02/27/21 0942 03/01/21 1241 03/01/21 1651  GLUCAP 102* 87 111*    Lipid Profile: Recent Labs    02/28/21 0027  CHOL 119  HDL 36*  LDLCALC 58  TRIG 126  CHOLHDL 3.3    Thyroid Function Tests: No results for input(s): TSH, T4TOTAL, FREET4, T3FREE, THYROIDAB  in the last 72 hours. Anemia Panel: Recent Labs    02/28/21 0027  VITAMINB12 124*    Sepsis Labs: No results for input(s): PROCALCITON, LATICACIDVEN in the last 168 hours.  Recent Results (from the past 240 hour(s))  Resp Panel by RT-PCR (Flu A&B, Covid) Nasopharyngeal Swab     Status: None   Collection Time: 02/27/21  9:47 AM   Specimen: Nasopharyngeal Swab; Nasopharyngeal(NP) swabs in vial transport medium  Result Value Ref Range Status   SARS Coronavirus 2 by RT PCR NEGATIVE NEGATIVE Final    Comment: (NOTE) SARS-CoV-2 target nucleic acids are NOT DETECTED.  The SARS-CoV-2 RNA is generally detectable in upper respiratory specimens during the acute phase of infection. The lowest concentration  of SARS-CoV-2 viral copies this assay can detect is 138 copies/mL. A negative result does not preclude SARS-Cov-2 infection and should not be used as the sole basis for treatment or other patient management decisions. A negative result may occur with  improper specimen collection/handling, submission of specimen other than nasopharyngeal swab, presence of viral mutation(s) within the areas targeted by this assay, and inadequate number of viral copies(<138 copies/mL). A negative result must be combined with clinical observations, patient history, and epidemiological information. The expected result is Negative.  Fact Sheet for Patients:  EntrepreneurPulse.com.au  Fact Sheet for Healthcare Providers:  IncredibleEmployment.be  This test is no t yet approved or cleared by the [place] FDA and  has been authorized for detection and/or diagnosis of SARS-CoV-2 by FDA under an Emergency Use Authorization (EUA). This EUA will remain  in effect (meaning this test can be used) for the duration of the COVID-19 declaration under Section 564(b)(1) of the Act, 21 U.S.C.section 360bbb-3(b)(1), unless the authorization is terminated  or revoked sooner.        Influenza A by PCR NEGATIVE NEGATIVE Final   Influenza B by PCR NEGATIVE NEGATIVE Final    Comment: (NOTE) The Xpert Xpress SARS-CoV-2/FLU/RSV plus assay is intended as an aid in the diagnosis of influenza from Nasopharyngeal swab specimens and should not be used as a sole basis for treatment. Nasal washings and aspirates are unacceptable for Xpert Xpress SARS-CoV-2/FLU/RSV testing.  Fact Sheet for Patients: EntrepreneurPulse.com.au  Fact Sheet for Healthcare Providers: IncredibleEmployment.be  This test is not yet approved or cleared by the [place] FDA and has been authorized for detection and/or diagnosis of SARS-CoV-2 by FDA under an Emergency Use Authorization (EUA). This EUA will remain in effect (meaning this test can be used) for the duration of the COVID-19 declaration under Section 564(b)(1) of the Act, 21 U.S.C. section 360bbb-3(b)(1), unless the authorization is terminated or revoked.  Performed at [facility], [address] [place], [place] [address]   Urine Culture     Status: None   Collection Time: 03/01/21 10:09 AM   Specimen: Urine, Clean Catch  Result Value Ref Range Status   Specimen Description URINE, CLEAN CATCH  Final   Special Requests NONE  Final   Culture   Final    NO GROWTH Performed at [facility], [place] [address], [place], [place] [address]    Report Status 03/02/2021 FINAL  Final          Radiology Studies: US RENAL  Result Date: 03/01/2021 CLINICAL DATA:  AK I EXAM: RENAL / URINARY TRACT ULTRASOUND COMPLETE COMPARISON:  Renal ultrasound 05/27/2020 FINDINGS: Right Kidney: Renal measurements: 10.0 x 5.0 x 4.2 cm = volume: 108 mL. Echogenicity within normal limits. No mass or hydronephrosis visualized. Left Kidney: Renal measurements: 9.2 x 4.9 x 5.2 cm = volume: 123 mL. Echogenicity within normal limits. No mass or hydronephrosis visualized. Bladder: Appears normal for  degree of bladder distention. Other: None. IMPRESSION: Unremarkable sonographic appearance of the bilateral kidneys. Electronically Signed   By: Audie Pinto M.D.   On: 03/01/2021 11:28   ECHOCARDIOGRAM COMPLETE  Result Date: 02/28/2021    ECHOCARDIOGRAM REPORT   Patient Name:   George [name]. Date of Exam: 02/28/2021 Medical Rec #:  [id number]         Height:       73.0 in Accession #:    [id number]        Weight:  196.6 lb Date of Birth:  Sep 24, 1945         BSA:          2.136 m Patient Age:    1 years          BP:           143/93 mmHg Patient Gender: M                 HR:           89 bpm. Exam Location:  Inpatient Procedure: 2D Echo Indications:    stroke  History:        Patient has no prior history of Echocardiogram examinations.                 Arrythmias:Atrial Fibrillation.  Sonographer:    Johny Chess RDCS Referring Phys: [id number] [place]  1. Pt in atrial fibrillation at time of study.  2. Left ventricular ejection fraction, by estimation, is 60 to 65%. The left ventricle has normal function. The left ventricle has no regional wall motion abnormalities. Left ventricular diastolic function could not be evaluated.  3. Right ventricular systolic function is normal. The right ventricular size is normal.  4. The mitral valve is normal in structure. Trivial mitral valve regurgitation. No evidence of mitral stenosis.  5. The aortic valve is abnormal. Aortic valve regurgitation is not visualized. Aortic valve sclerosis is present, with no evidence of aortic valve stenosis. Comparison(s): No prior Echocardiogram. FINDINGS  Left Ventricle: Left ventricular ejection fraction, by estimation, is 60 to 65%. The left ventricle has normal function. The left ventricle has no regional wall motion abnormalities. The left ventricular internal cavity size was normal in size. There is  no left ventricular hypertrophy. Left ventricular diastolic function could not be evaluated due to  atrial fibrillation. Left ventricular diastolic function could not be evaluated. Right Ventricle: The right ventricular size is normal. Right ventricular systolic function is normal. Left Atrium: Left atrial size was normal in size. Right Atrium: Right atrial size was normal in size. Pericardium: There is no evidence of pericardial effusion. Mitral Valve: The mitral valve is normal in structure. Trivial mitral valve regurgitation. No evidence of mitral valve stenosis. Tricuspid Valve: The tricuspid valve is normal in structure. Tricuspid valve regurgitation is mild . No evidence of tricuspid stenosis. Aortic Valve: The aortic valve is abnormal. Aortic valve regurgitation is not visualized. Aortic valve sclerosis is present, with no evidence of aortic valve stenosis. Pulmonic Valve: The pulmonic valve was normal in structure. Pulmonic valve regurgitation is trivial. No evidence of pulmonic stenosis. Aorta: The aortic root is normal in size and structure. Venous: The inferior vena cava was not well visualized. IAS/Shunts: The interatrial septum was not well visualized. Additional Comments: Pt in atrial fibrillation at time of study.  LEFT VENTRICLE PLAX 2D LVIDd:         4.80 cm LVIDs:         3.00 cm LV PW:         0.90 cm LV IVS:        0.80 cm LVOT diam:     1.90 cm LVOT Area:     2.84 cm  RIGHT VENTRICLE RV S prime:     10.80 cm/s TAPSE (M-mode): 1.8 cm LEFT ATRIUM             Index        RIGHT ATRIUM           Index LA  diam:        4.00 cm 1.87 cm/m   RA Area:     13.50 cm LA Vol (A2C):   55.3 ml 25.89 ml/m  RA Volume:   31.40 ml  14.70 ml/m LA Vol (A4C):   53.8 ml 25.18 ml/m LA Biplane Vol: 54.6 ml 25.56 ml/m   AORTA Ao Root diam: 2.90 cm Ao Asc diam:  3.40 cm TRICUSPID VALVE TR Peak grad:   18.1 mmHg TR Vmax:        213.00 cm/s  SHUNTS Systemic Diam: 1.90 cm Kirk Ruths MD Electronically signed by Kirk Ruths MD Signature Date/Time: 02/28/2021/4:19:34 PM    Final         Scheduled Meds:   apixaban  5 mg Oral BID   atorvastatin  80 mg Oral Daily   buPROPion  450 mg Oral q morning   Chlorhexidine Gluconate Cloth  6 each Topical Daily   cyanocobalamin  1,000 mcg Intramuscular Daily   metoprolol succinate  100 mg Oral Daily   [START ON 03/07/2021] vitamin B-12  1,000 mcg Oral Daily   Vitamin D (Ergocalciferol)  50,000 Units Oral Q7 days   Continuous Infusions:  sodium chloride 75 mL/hr at 03/02/21 0328     LOS: 3 days    Time spent: 35 minutes.     Elmarie Shiley, MD [facility]   If 7PM-7AM, please contact night-coverage [email or web address]  03/02/2021, 12:36 PM

## 2021-03-03 DIAGNOSIS — N179 Acute kidney failure, unspecified: Secondary | ICD-10-CM | POA: Diagnosis not present

## 2021-03-03 LAB — BASIC METABOLIC PANEL
Anion gap: 7 (ref 5–15)
BUN: 13 mg/dL (ref 8–23)
CO2: 19 mmol/L — ABNORMAL LOW (ref 22–32)
Calcium: 8.4 mg/dL — ABNORMAL LOW (ref 8.9–10.3)
Chloride: 110 mmol/L (ref 98–111)
Creatinine, Ser: 1.62 mg/dL — ABNORMAL HIGH (ref 0.61–1.24)
GFR, Estimated: 43 mL/min — ABNORMAL LOW (ref >60)
Glucose, Bld: 88 mg/dL (ref 70–99)
Potassium: 3.3 mmol/L — ABNORMAL LOW (ref 3.5–5.1)
Sodium: 136 mmol/L (ref 135–145)

## 2021-03-03 MED ORDER — POTASSIUM CHLORIDE CRYS ER 20 MEQ PO TBCR
40.0000 meq | EXTENDED_RELEASE_TABLET | Freq: Once | ORAL | Status: AC
  Administered 2021-03-03: 40 meq via ORAL
  Filled 2021-03-03: qty 2

## 2021-03-03 MED ORDER — METOPROLOL SUCCINATE ER 25 MG PO TB24
125.0000 mg | ORAL_TABLET | Freq: Every day | ORAL | Status: DC
  Administered 2021-03-04 – 2021-03-05 (×2): 125 mg via ORAL
  Filled 2021-03-03 (×2): qty 1

## 2021-03-03 MED ORDER — METOPROLOL TARTRATE 5 MG/5ML IV SOLN
2.5000 mg | Freq: Four times a day (QID) | INTRAVENOUS | Status: DC | PRN
  Administered 2021-03-03: 2.5 mg via INTRAVENOUS
  Filled 2021-03-03: qty 5

## 2021-03-03 MED ORDER — METOPROLOL SUCCINATE ER 25 MG PO TB24
25.0000 mg | ORAL_TABLET | Freq: Every day | ORAL | Status: DC
Start: 2021-03-03 — End: 2021-03-04
  Administered 2021-03-03: 25 mg via ORAL
  Filled 2021-03-03: qty 1

## 2021-03-03 NOTE — Progress Notes (Signed)
PROGRESS NOTE    George [name].  SFK:[id number] DOB: 05-Mar-1946 DOA: 02/27/2021 PCP: Charolette Forward, MD   Brief Narrative: 75 year old with past medical history significant for colon cancer status postresection, prostate cancer, anxiety/depression, osteoarthritis of  bilateral knees, who presented to the ED complaining of persistent legs and left-sided weakness since Thursday (11/17).  He has been feeling sick since Monday after he got the flu shot.   Evaluation in the ED patient was found to have left visual defect, hemianopsia not, left hemiparesis.  CT CTA head and neck revealed subacute right PCA territorial infarct with probably small foci of petechial hemorrhage.  Possible focal occlusion of P2 branch.  Patient subsequently developed A. fib with RVR.  He was started on his metoprolol and Cardizem drip. Neurology has been consulted.  Failure for the recommendation is anticoagulation.  Patient unable to get MRI due to following mental left eye.  Assessment & Plan:   Principal Problem:   CVA (cerebral vascular accident) ([place]) Active Problems:   Generalized anxiety disorder   Chemotherapy-induced neuropathy ([place])   B12 deficiency   Atrial fibrillation with RVR (HCC)   Hypokalemia   Prolonged QT interval  1-Acute PCA stroke with a small petechial hemorrhage: -Patient presented with left side discoordination and visual changes.  -CTA; :Subacute right PCA territorial infarct with probable small foci of petechial hemorrhage.  Possible focal occlusion of a P2 branch at the level of the ambient cistern, though the PCA is identified within the hypodense right occipital lobe. Small remote infarct in the right parietal lobe. -Patient also found to have A. fib with RVR. -Unable to proceed with MRI due to patient having foreign metal left eye. -Continue with the statins. -LDL:58 -A1C:5.8 -PT, OT consulted. Recommend CIR>  -Started on Eliquis per neurology.  -Stable.   2-New onset A.  fib: Troponin not significantly elevated. Home dose Metoprolol  50 mg. Hold off Cardizem.  Started on Eliquis.  HR elevated this am, plan to increase metoprolol Xl to 125 mg daily.   Headache: Setting of #1: Continue with vicodin.  Improving.  Neuro exam stable.   Prolong QT: replete electrolytes.   Hypokalemia: Replaced.   AKI on CKD stage IIIb: Creatinine  baseline around 1.6--- 1.8. Worsening renal function up to 2.0.  Renal US negative for hydronephrosis.  NLS fluids.  Improved, cr down to 1.6  History of colon and prostate cancer, status posts induced peripheral neuropathy  B12 deficiency: B 12 low at 124. Started supplement. IM.   Depression; Psych consulted for medication management.t   Estimated body mass index is 27.08 kg/m as calculated from the following:   Height as of this encounter: 6\' 1"  (1.854 m).   Weight as of this encounter: 93.1 kg.   DVT prophylaxis: SCD Code Status: Full code Family Communication: care discussed with wife who was at bedside.  Disposition Plan:  Status is: Inpatient  Remains inpatient appropriate because: stroke, a fib RRR. Awaiting CIR bed. Stable for transfer        Consultants:  Neurology  Procedures:  ECHO pending.  Antimicrobials:    Subjective: He is having headaches this am. Over all feels headaches are improving.  Wife is concern he is getting depress.   Objective: Vitals:   03/03/21 1211 03/03/21 1357 03/03/21 1359 03/03/21 1630  BP: 132/90   (!) 123/92  Pulse: (!) 108 (!) 120 (!) 102 87  Resp: 16   16  Temp: 98.4 F (36.9 C)   98.8 F (37.1 C)  TempSrc: Oral   Oral  SpO2: 96%  94% 100%  Weight:      Height:        Intake/Output Summary (Last 24 hours) at 03/03/2021 1659 Last data filed at 03/03/2021 1248 Gross per 24 hour  Intake 240 ml  Output 1700 ml  Net -1460 ml    Filed Weights   02/27/21 1949 03/01/21 0400  Weight: 89.2 kg 93.1 kg    Examination:  General exam:  NAD Respiratory system: CTA Cardiovascular system: S 1, S 2 IRR Gastrointestinal system: BS present, soft, nt Central nervous system: Alert, moves 4 extremities. extremities. Left upper and lower extremity loss of coordination. Neuro exam stable.  Extremities: no edema   Data Reviewed: I have personally reviewed following labs and imaging studies  CBC: Recent Labs  Lab 02/27/21 0937 02/27/21 1945 03/01/21 0033 03/02/21 0733  WBC 10.4 10.2 10.3 10.4  NEUTROABS 7.6  --   --   --   HGB 13.4 13.6 10.9* 11.6*  HCT 41.6 43.8 33.7* 36.0*  MCV 89.8 89.9 89.9 91.6  PLT 294 306 269 329    Basic Metabolic Panel: Recent Labs  Lab 02/27/21 0937 02/27/21 1945 02/28/21 0027 03/01/21 0033 03/02/21 0733 03/03/21 0055  NA 142  --  137 136 140 136  K 3.2*  --  3.2* 3.2* 3.6 3.3*  CL 107  --  107 107 109 110  CO2 22  --  23 22 21* 19*  GLUCOSE 97  --  119* 118* 88 88  BUN 12  --  9 19 15 13   CREATININE 1.63* 1.60* 1.60* 2.05* 1.72* 1.62*  CALCIUM 9.9  --  8.8* 8.3* 8.7* 8.4*  MG 2.1  --  2.3  --   --   --     GFR: Estimated Creatinine Clearance: 43.2 mL/min (A) (by C-G formula based on SCr of 1.62 mg/dL (H)). Liver Function Tests: Recent Labs  Lab 02/27/21 0937  AST 24  ALT 13  ALKPHOS 76  BILITOT 0.6  PROT 8.4*  ALBUMIN 4.7    No results for input(s): LIPASE, AMYLASE in the last 168 hours. No results for input(s): AMMONIA in the last 168 hours. Coagulation Profile: Recent Labs  Lab 02/27/21 0937  INR 1.0    Cardiac Enzymes: No results for input(s): CKTOTAL, CKMB, CKMBINDEX, TROPONINI in the last 168 hours. BNP (last 3 results) No results for input(s): PROBNP in the last 8760 hours. HbA1C: No results for input(s): HGBA1C in the last 72 hours.  CBG: Recent Labs  Lab 02/27/21 0942 03/01/21 1241 03/01/21 1651  GLUCAP 102* 87 111*    Lipid Profile: No results for input(s): CHOL, HDL, LDLCALC, TRIG, CHOLHDL, LDLDIRECT in the last 72 hours.  Thyroid  Function Tests: No results for input(s): TSH, T4TOTAL, FREET4, T3FREE, THYROIDAB in the last 72 hours. Anemia Panel: No results for input(s): VITAMINB12, FOLATE, FERRITIN, TIBC, IRON, RETICCTPCT in the last 72 hours.  Sepsis Labs: No results for input(s): PROCALCITON, LATICACIDVEN in the last 168 hours.  Recent Results (from the past 240 hour(s))  Resp Panel by RT-PCR (Flu A&B, Covid) Nasopharyngeal Swab     Status: None   Collection Time: 02/27/21  9:47 AM   Specimen: Nasopharyngeal Swab; Nasopharyngeal(NP) swabs in vial transport medium  Result Value Ref Range Status   SARS Coronavirus 2 by RT PCR NEGATIVE NEGATIVE Final    Comment: (NOTE) SARS-CoV-2 target nucleic acids are NOT DETECTED.  The SARS-CoV-2 RNA is generally detectable in upper respiratory  specimens during the acute phase of infection. The lowest concentration of SARS-CoV-2 viral copies this assay can detect is 138 copies/mL. A negative result does not preclude SARS-Cov-2 infection and should not be used as the sole basis for treatment or other patient management decisions. A negative result may occur with  improper specimen collection/handling, submission of specimen other than nasopharyngeal swab, presence of viral mutation(s) within the areas targeted by this assay, and inadequate number of viral copies(<138 copies/mL). A negative result must be combined with clinical observations, patient history, and epidemiological information. The expected result is Negative.  Fact Sheet for Patients:  EntrepreneurPulse.com.au  Fact Sheet for Healthcare Providers:  IncredibleEmployment.be  This test is no t yet approved or cleared by the [place] FDA and  has been authorized for detection and/or diagnosis of SARS-CoV-2 by FDA under an Emergency Use Authorization (EUA). This EUA will remain  in effect (meaning this test can be used) for the duration of the COVID-19 declaration under  Section 564(b)(1) of the Act, 21 U.S.C.section 360bbb-3(b)(1), unless the authorization is terminated  or revoked sooner.       Influenza A by PCR NEGATIVE NEGATIVE Final   Influenza B by PCR NEGATIVE NEGATIVE Final    Comment: (NOTE) The Xpert Xpress SARS-CoV-2/FLU/RSV plus assay is intended as an aid in the diagnosis of influenza from Nasopharyngeal swab specimens and should not be used as a sole basis for treatment. Nasal washings and aspirates are unacceptable for Xpert Xpress SARS-CoV-2/FLU/RSV testing.  Fact Sheet for Patients: EntrepreneurPulse.com.au  Fact Sheet for Healthcare Providers: IncredibleEmployment.be  This test is not yet approved or cleared by the [place] FDA and has been authorized for detection and/or diagnosis of SARS-CoV-2 by FDA under an Emergency Use Authorization (EUA). This EUA will remain in effect (meaning this test can be used) for the duration of the COVID-19 declaration under Section 564(b)(1) of the Act, 21 U.S.C. section 360bbb-3(b)(1), unless the authorization is terminated or revoked.  Performed at [facility], [address], [place], [place] [address]   Urine Culture     Status: None   Collection Time: 03/01/21 10:09 AM   Specimen: Urine, Clean Catch  Result Value Ref Range Status   Specimen Description URINE, CLEAN CATCH  Final   Special Requests NONE  Final   Culture   Final    NO GROWTH Performed at [facility], [place] [address], [place], [place] [address]    Report Status 03/02/2021 FINAL  Final          Radiology Studies: No results found.      Scheduled Meds:  apixaban  5 mg Oral BID   atorvastatin  80 mg Oral Daily   buPROPion  450 mg Oral q morning   Chlorhexidine Gluconate Cloth  6 each Topical Daily   cyanocobalamin  1,000 mcg Intramuscular Daily   [START ON 03/04/2021] metoprolol succinate  125 mg Oral Daily   metoprolol succinate  25 mg  Oral Daily   [START ON 03/07/2021] vitamin B-12  1,000 mcg Oral Daily   Vitamin D (Ergocalciferol)  50,000 Units Oral Q7 days   Continuous Infusions:  sodium chloride 75 mL/hr at 03/03/21 0420     LOS: 4 days    Time spent: 35 minutes.     Elmarie Shiley, MD [facility]   If 7PM-7AM, please contact night-coverage [email or web address]  03/03/2021, 4:59 PM

## 2021-03-03 NOTE — Progress Notes (Signed)
ANTICOAGULATION CONSULT NOTE - Follow-up Consult  Pharmacy Consult for Eliquis dosing Indication: atrial fibrillation (new onset)  Allergies  Allergen Reactions   Amlodipine Other (See Comments)    Dizziness    Gabapentin     hallucinations    Patient Measurements: Height: 6\' 1"  (185.4 cm) Weight: 93.1 kg (205 lb 4 oz) IBW/kg (Calculated) : 79.9  Vital Signs: Temp: 98.5 F (36.9 C) (11/22 0753) Temp Source: Oral (11/22 0753) BP: 132/95 (11/22 0753) Pulse Rate: 109 (11/22 0753)  Labs: Recent Labs    03/01/21 0033 03/02/21 0733 03/03/21 0055  HGB 10.9* 11.6*  --   HCT 33.7* 36.0*  --   PLT 269 249  --   CREATININE 2.05* 1.72* 1.62*     Estimated Creatinine Clearance: 43.2 mL/min (A) (by C-G formula based on SCr of 1.62 mg/dL (H)).   Medical History: Past Medical History:  Diagnosis Date   Anxiety    Arthritis    OSTEO IN KNEE   Asthma    as child   Colon cancer ([place])    Depression    Elevated prostate specific antigen (PSA)    Generalized anxiety disorder 04/09/2015   Hematospermia    Hx antineoplastic chemotherapy 2010   Hypertension    Idiopathic peripheral neuropathy    TOES OF BOTH FEET DUE TO CHEMO   Incomplete bladder emptying    Malignant neoplasm of prostate ([place])    Nodular prostate with lower urinary tract symptoms    Panic attacks 04/09/2015   Primary localized osteoarthrosis of the knee, right 05/19/2016   Prostatitis    S/P total knee arthroplasty, left 05/19/2016   Spermatocele    Weak urinary stream     Medications:  See MAR  Assessment: Admitted with CVA with petechial hemorrhage. SCr 1.60, Wt >60kg, age <80 years. CBC stable, no signs of bleeding noted. 11/19- Discussed with neuro- still recommended in setting of small hemorrhage.   Goal of Therapy:  Stroke prevention in setting of atrial fibrillation.   Plan:  Continue apixaban 5 mg BID Monitor CBC and signs of bleeding TOC consulted to help with medication  cost/coverage Pharmacy will sign off and monitor peripherally    Thank you for allowing [place] to participate in this patients care. Jens Som, PharmD 03/03/2021 10:32 AM  **Pharmacist phone directory can be found on [email or web address] listed under [place]**

## 2021-03-03 NOTE — Progress Notes (Signed)
Inpatient Rehab Admissions Coordinator:   I have no beds available for this patient to admit to CIR today.  Will continue to follow for timing of potential admission pending bed availability.  Shann Medal, PT, DPT Admissions Coordinator [phone] 03/03/21  11:58 AM

## 2021-03-03 NOTE — H&P (Signed)
Physical Medicine and Rehabilitation Admission H&P    Chief Complaint  Patient presents with   Stroke Symptoms  : HPI: George [name], George [name]. is a 75 year old right-handed male with history of colon cancer status postresection 2009 with chemotherapy, chemotherapy-induced peripheral neuropathy, prostate cancer with TURP 2016, anxiety/depression, hypertension, quit smoking 48 years ago, osteoarthritis bilateral knees status post TKA.  Per chart review lives with spouse.  1 level home with level entry.  Independent ADLs prior to admission and driving.  Presented 02/27/2021 med [facility] with acute onset of left-sided weakness as well as headache .  CT angiogram head and neck showed subacute right PCA territory infarct with probable small foci of petechial hemorrhage.  Possible focal occlusion of P2 branch at the level of the ambient cistern.  Small remote infarct right parietal lobe.  Patient did not receive tPA.  Admission chemistry unremarkable except creatinine 1.63-2.05, potassium 3.2, troponin 27, urinalysis negative nitrite.  Patient subsequently developed atrial fibrillation with RVR.  He was started on metoprolol as well as Cardizem drip.  Echocardiogram with ejection fraction of 60 to 65% no wall motion abnormalities.  Neurology follow-up maintained on Eliquis for CVA prophylaxis as well as atrial fibrillation.  AKI with creatinine 1.63 renal ultrasound negative for hydronephrosis initially maintained on IV fluids with latest creatinine 1.64.  He is tolerating a regular diet.  Therapy evaluations completed due to patient's left-sided weakness and decreased functional mobility was admitted for a comprehensive rehab program.   Pt reports having increased panic attacks- on high dose Wellbutrin- doesn't want to change due to depression in past- but willing to try Paxil which is SSRI to add for anxiety.  LBM last night; peeing OK.  No pain; c/o coordination issues.    Review of Systems   Constitutional:  Negative for chills and fever.  HENT:  Negative for hearing loss.   Eyes:  Negative for blurred vision.  Respiratory:  Negative for cough and shortness of breath.   Cardiovascular:  Positive for palpitations. Negative for chest pain and leg swelling.  Gastrointestinal:  Positive for constipation. Negative for heartburn, nausea and vomiting.  Genitourinary:  Negative for dysuria and hematuria.  Musculoskeletal:  Positive for joint pain and myalgias.  Skin:  Negative for rash.  Neurological:  Positive for weakness.  Psychiatric/Behavioral:  Positive for depression.        Anxiety/panic attacks  All other systems reviewed and are negative. Past Medical History:  Diagnosis Date   Anxiety    Arthritis    OSTEO IN KNEE   Asthma    as child   Colon cancer ([place])    Depression    Elevated prostate specific antigen (PSA)    Generalized anxiety disorder 04/09/2015   Hematospermia    Hx antineoplastic chemotherapy 2010   Hypertension    Idiopathic peripheral neuropathy    TOES OF BOTH FEET DUE TO CHEMO   Incomplete bladder emptying    Malignant neoplasm of prostate ([place])    Nodular prostate with lower urinary tract symptoms    Panic attacks 04/09/2015   Primary localized osteoarthrosis of the knee, right 05/19/2016   Prostatitis    S/P total knee arthroplasty, left 05/19/2016   Spermatocele    Weak urinary stream    Past Surgical History:  Procedure Laterality Date   COLON RESECTION   NOV 2009   COLONOSCOPY     PROSTATE BIOPSY     SPERMATOCELECTOMY Left 05/07/2014   Procedure: LEFT SPERMATOCELECTOMY;  Surgeon: Jenny Reichmann  Keene Breath, MD;  Location: [facility];  Service: Urology;  Laterality: Left;   TONSILLECTOMY     TOTAL KNEE ARTHROPLASTY Left 04/21/2015   Procedure: LEFT TOTAL KNEE ARTHROPLASTY;  Surgeon: Elsie Saas, MD;  Location: [facility];  Service: Orthopedics;  Laterality: Left;   TOTAL KNEE ARTHROPLASTY Right 05/31/2016   Procedure: TOTAL KNEE ARTHROPLASTY;  Surgeon: Elsie Saas, MD;  Location: [facility];  Service: Orthopedics;  Laterality: Right;   TRANSURETHRAL RESECTION OF PROSTATE N/A 05/07/2014   Procedure: TRANSURETHRAL RESECTION OF THE PROSTATE WITH GYRUS INSTRUMENTS;  Surgeon: Malka So, MD;  Location: [facility];  Service: Urology;  Laterality: N/A;   Family History  Problem Relation Age of Onset   Stroke Father    Cancer Brother        neoplasm of brain   Social History:  reports that he quit smoking about 48 years ago. His smoking use included cigarettes. He has a 0.75 pack-year smoking history. He has never used smokeless tobacco. He reports current alcohol use of about 1.0 standard drink per week. He reports that he does not use drugs. Allergies:  Allergies  Allergen Reactions   Amlodipine Other (See Comments)    Dizziness    Gabapentin     hallucinations   Medications Prior to Admission  Medication Sig Dispense Refill   acetaminophen (TYLENOL) 500 MG tablet Take 500 mg by mouth every 6 (six) hours as needed for mild pain or headache.     atorvastatin (LIPITOR) 20 MG tablet Take 20 mg by mouth daily.     buPROPion (WELLBUTRIN XL) 150 MG 24 hr tablet Take 450 mg by mouth every morning.      felodipine (PLENDIL) 10 MG 24 hr tablet Take 10 tablets by mouth daily.     losartan (COZAAR) 100 MG tablet Take 100 mg by mouth daily.     metoprolol succinate (TOPROL-XL) 50 MG 24 hr tablet Take 50 mg by mouth daily. Take with or immediately following a meal.     oxymetazoline (AFRIN) 0.05 % nasal spray Place 2 sprays into both nostrils 2 (two) times daily as needed for congestion.     VITAMIN D PO Take 1 capsule by mouth daily.      Drug Regimen Review Drug regimen was reviewed and remains appropriate with no significant issues identified  Home: Home Living Family/patient expects to be discharged to:: Private residence Living Arrangements: Spouse/significant other Available Help at Discharge: Family, Available 24 hours/day Type of Home: House Home  Access: Level entry Home Layout: One level Bathroom Shower/Tub: Multimedia programmer: Associate Professor Accessibility: Yes Home Equipment: None  Lives With: Spouse   Functional History: Prior Function Prior Level of Function : Independent/Modified Independent, Driving Mobility Comments: no use of AD ADLs Comments: Drives, Independent in all daily tasks. previously designed and worked on houses  Functional Status:  Mobility: Bed Mobility Overal bed mobility: Needs Assistance Bed Mobility: Supine to Sit Supine to sit: Supervision Sit to supine: Supervision General bed mobility comments: verbal cues for LLE and LUE as coming off the bed (leaving them behind) Transfers Overall transfer level: Needs assistance Equipment used: Rolling walker (2 wheels) Transfers: Sit to/from Stand Sit to Stand: Mod assist Bed to/from chair/wheelchair/BSC transfer type:: Step pivot Stand pivot transfers: Max assist, +2 physical assistance, +2 safety/equipment Step pivot transfers: Mod assist General transfer comment: dyscoordinated as trying to push off the bed and imbalance requiring mod assist to maintain balance and get LUE on RW Ambulation/Gait Ambulation/Gait  assistance: Min Web designer (Feet): 30 Feet Assistive device: Rolling walker (2 wheels) Gait Pattern/deviations: Step-to pattern, Ataxic General Gait Details: pt with slowed step-to gait, reduced step length bilaterally, pt with ataxia of LLE Gait velocity: reduced Gait velocity interpretation: <1.31 ft/sec, indicative of household ambulator    ADL: ADL Overall ADL's : Needs assistance/impaired Eating/Feeding: Minimal assistance, Sitting Grooming: Minimal assistance, Sitting Upper Body Bathing: Moderate assistance, Sitting Lower Body Bathing: Maximal assistance, +2 for physical assistance, +2 for safety/equipment, Sit to/from stand Upper Body Dressing : Moderate assistance, Sitting Lower Body Dressing: Maximal  assistance, +2 for physical assistance, +2 for safety/equipment, Sit to/from stand Toilet Transfer: Maximal assistance, +2 for physical assistance, +2 for safety/equipment, Stand-pivot, BSC/3in1 Toilet Transfer Details (indicate cue type and reason): Max A x 2 for pivot to/from [facility] with manual assist. On entry, pt nephew assisting pt to EOB for bathroom mobility - due to noted L sided deficits, encouraged use of BSC instead due to high fall risk Toileting- Clothing Manipulation and Hygiene: Maximal assistance, +2 for physical assistance, +2 for safety/equipment, Sit to/from stand Toileting - Clothing Manipulation Details (indicate cue type and reason): unable to void on BSC. +2 assist for clothing mgmt and balance General ADL Comments: pt delined ADLs this session. Spent attending to and locating and obtaining items to his left  Cognition: Cognition Overall Cognitive Status: Impaired/Different from baseline Arousal/Alertness: Awake/alert Orientation Level: Oriented X4 Year: 2022 Month: November Day of Week: Correct Memory: Appears intact (recalled 5/5 words after 2 minute delay) Problem Solving: Impaired Problem Solving Impairment: Verbal complex Executive Function: Self Monitoring Self Monitoring: Impaired Self Monitoring Impairment: Verbal complex Safety/Judgment: Appears intact Cognition Arousal/Alertness: Awake/alert Behavior During Therapy: WFL for tasks assessed/performed Overall Cognitive Status: Impaired/Different from baseline Area of Impairment: Awareness Orientation Level: Disoriented to, Situation Current Attention Level: Sustained Memory: Decreased recall of precautions Following Commands: Follows one step commands with increased time Safety/Judgement: Decreased awareness of deficits Awareness: Emergent Problem Solving: Requires verbal cues General Comments: Pt with improved awareness of deficits stating that his L side is incoordinated and weak and he has trouble  "paying attention" to the L side. Pt also admitted that he is at risk for falls.  Physical Exam: Blood pressure (!) 140/92, pulse (!) 108, temperature 97.8 F (36.6 C), temperature source Oral, resp. rate 16, height 6\' 1"  (1.854 m), weight 93.1 kg, SpO2 96 %. Physical Exam Vitals and nursing note reviewed. Exam conducted with a chaperone present.  Constitutional:      General: He is not in acute distress.    Appearance: He is normal weight. He is not ill-appearing.     Comments: Sitting up in bed; family and other doctor at bedside; pt has dysconjugate gaze at rest, NAD  HENT:     Head: Normocephalic and atraumatic.     Comments: Trace L facial droop; tongue midline    Nose: Nose normal. No congestion.     Mouth/Throat:     Mouth: Mucous membranes are dry.     Pharynx: Oropharynx is clear. No oropharyngeal exudate.  Eyes:     Comments: EOMI B/L, but L eye delayed; no nystagmus; L visual field cut- hemianopsia  Cardiovascular:     Rate and Rhythm: Regular rhythm.     Heart sounds: Normal heart sounds. No murmur heard.   No gallop.     Comments: Rate low 60s- no JVD Pulmonary:     Comments: CTA B/L- no W/R/R- good air movement  Abdominal:  Comments: Soft, NT, ND, (+)BS -normoactive  Musculoskeletal:     Cervical back: Normal range of motion. No rigidity.     Comments: R side 5/5 in RUE/RLE LUE- 4+/5 in Biceps, triceps, WE, grip and FA LLE- 5-/5 in HF/KE/DF and PF  Skin:    General: Skin is warm and dry.  Neurological:     Mental Status: He is alert.     Comments: Patient is alert.  Makes eye contact with examiner with noted left hemianopsia.  Follows simple commands.  Provides name and age some delay in processing. Intact to light touch in all 4 extremities Finger to nose searching pattern on L side Coordination impaired on L side- touching all fingers with thumb L pronator drift- mild to moderate  Psychiatric:     Comments: Slightly anxious c/o panic attacks     Results for orders placed or performed during the hospital encounter of 02/27/21 (from the past 48 hour(s))  Basic metabolic panel     Status: Abnormal   Collection Time: 03/03/21 12:55 AM  Result Value Ref Range   Sodium 136 135 - 145 mmol/L   Potassium 3.3 (L) 3.5 - 5.1 mmol/L   Chloride 110 98 - 111 mmol/L   CO2 19 (L) 22 - 32 mmol/L   Glucose, Bld 88 70 - 99 mg/dL    Comment: Glucose reference range applies only to samples taken after fasting for at least 8 hours.   BUN 13 8 - 23 mg/dL   Creatinine, Ser 1.62 (H) 0.61 - 1.24 mg/dL   Calcium 8.4 (L) 8.9 - 10.3 mg/dL   GFR, Estimated 43 (L) >60 mL/min    Comment: (NOTE) Calculated using the CKD-EPI Creatinine Equation (2021)    Anion gap 7 5 - 15    Comment: Performed at [facility] [address] [place], [place] [address]  CBC     Status: Abnormal   Collection Time: 03/04/21  1:08 AM  Result Value Ref Range   WBC 7.9 4.0 - 10.5 K/uL   RBC 3.51 (L) 4.22 - 5.81 MIL/uL   Hemoglobin 10.3 (L) 13.0 - 17.0 g/dL   HCT 31.3 (L) 39.0 - 52.0 %   MCV 89.2 80.0 - 100.0 fL   MCH 29.3 26.0 - 34.0 pg   MCHC 32.9 30.0 - 36.0 g/dL   RDW 13.7 11.5 - 15.5 %   Platelets 282 150 - 400 K/uL   nRBC 0.0 0.0 - 0.2 %    Comment: Performed at [facility], [place] [address], [place], [place] [address]  Basic metabolic panel     Status: Abnormal   Collection Time: 03/04/21  1:08 AM  Result Value Ref Range   Sodium 138 135 - 145 mmol/L   Potassium 3.3 (L) 3.5 - 5.1 mmol/L   Chloride 109 98 - 111 mmol/L   CO2 21 (L) 22 - 32 mmol/L   Glucose, Bld 87 70 - 99 mg/dL    Comment: Glucose reference range applies only to samples taken after fasting for at least 8 hours.   BUN 12 8 - 23 mg/dL   Creatinine, Ser 1.64 (H) 0.61 - 1.24 mg/dL   Calcium 8.5 (L) 8.9 - 10.3 mg/dL   GFR, Estimated 43 (L) >60 mL/min    Comment: (NOTE) Calculated using the CKD-EPI Creatinine Equation (2021)    Anion gap 8 5 - 15    Comment: Performed at [facility] [address] [place], [place] [address]  Magnesium     Status: None   Collection Time: 03/04/21  1:08 AM  Result Value Ref Range   Magnesium 1.9 1.7 - 2.4 mg/dL    Comment: Performed at [facility] [address], [place], [place] [address]   No results found.     Medical Problem List and Plan: 1.  Left-sided weakness functional deficits secondary to subacute right PCA infarct secondary new onset atrial fibrillation  -patient may  shower  -ELOS/Goals: 13-15 days supervision 2.  Antithrombotics: -DVT/anticoagulation:  Pharmaceutical: Other (comment) Eliquis  -antiplatelet therapy: N/A 3. Pain Management: Hydrocodone as needed 4. Mood: Wellbutrin 450 mg daily  -will add Paxil 20 mg QHS for panic attacks, since can mix with Wellbutrin- went over with pt.   -antipsychotic agents: N/A 5. Neuropsych: This patient is capable of making decisions on his own behalf. 6. Skin/Wound Care: Routine skin checks 7. Fluids/Electrolytes/Nutrition: Routine in and outs with follow-up chemistries 8.  Permissive hypertension.  Presently on Toprol-XL 125 mg daily.  Monitor with increased mobility.  Patient also on losartan 100 mg daily, Aldactone 25 mg daily, HCTZ 25 mg daily prior to admission.  Resume as needed 9.  Hyperlipidemia.  Lipitor 10.  AKI.  Renal ultrasound negative.  Follow-up chemistries  Cr 2.0- will push fluids and recheck in AM 11.  New onset atrial fibrillation.  Continue Eliquis.  Cardiac rate controlled 12.  History of colon cancer with resection in 2009/chemotherapy.  Follow-up outpatient 13.  History of prostate cancer TURP 2016.  Follow-up outpatient  I have personally performed a face to face diagnostic evaluation of this patient and formulated the key components of the plan.  Additionally, I have personally reviewed laboratory data, imaging studies, as well as relevant notes and concur with the physician assistant's documentation above.   The patient's status  has not changed from the original H&P.  Any changes in documentation from the acute care chart have been noted above.     Lavon Paganini Angiulli, PA-C 03/04/2021

## 2021-03-03 NOTE — Progress Notes (Signed)
Physical Therapy Treatment Patient Details Name: George [name]. MRN: [id number] DOB: 12-30-1945 Today's Date: 03/03/2021   History of Present Illness 75 y.o. male [facility] hospital on [facility]/2021 with HA and L weakness. L hemianopsia also noted in ED. CT head revealed subacute R PCA infarct with small foci of petechial hemorrhage, as well as small infarct in parietal lobe. PMH includes colon cancer s/p resection, prostate cancer, anxiety/depression, osteoarthritis of bilateral knees.    PT Comments    Patient with elevated HR this date. Waited until RN had administered meds and resting HR down to 106-115 bpm. Initiated session with bed level activities (see below for details) with HR staying 110s-120s. With room ambulation, HR increased to 145 bpm after 30 ft and returned pt to bed. RN made aware. Patient making excellent progress with attention to left side and control.     Recommendations for follow up therapy are one component of a multi-disciplinary discharge planning process, led by the attending physician.  Recommendations may be updated based on patient status, additional functional criteria and insurance authorization.  Follow Up Recommendations  Acute inpatient rehab (3hours/day)     Assistance Recommended at Discharge Frequent or constant Supervision/Assistance  Equipment Recommendations   (TBD)    Recommendations for Other Services Rehab consult     Precautions / Restrictions Precautions Precautions: Fall;Other (comment) Precaution Comments: L inattention     Mobility  Bed Mobility Overal bed mobility: Needs Assistance Bed Mobility: Supine to Sit     Supine to sit: Supervision Sit to supine: Supervision   General bed mobility comments: verbal cues for LLE and LUE as coming off the bed (leaving them behind)    Transfers Overall transfer level: Needs assistance Equipment used: Rolling walker (2 wheels) Transfers: Sit to/from Stand Sit to Stand: Mod  assist           General transfer comment: dyscoordinated as trying to push off the bed and imbalance requiring mod assist to maintain balance and get LUE on RW    Ambulation/Gait Ambulation/Gait assistance: Min assist Gait Distance (Feet): 30 Feet Assistive device: Rolling walker (2 wheels) Gait Pattern/deviations: Step-to pattern;Ataxic Gait velocity: reduced     General Gait Details: pt with slowed step-to gait, reduced step length bilaterally, pt with ataxia of LLE   Stairs             Wheelchair Mobility    Modified Rankin (Stroke Patients Only) Modified Rankin (Stroke Patients Only) Pre-Morbid Rankin Score: No symptoms Modified Rankin: Moderately severe disability     Balance Overall balance assessment: Needs assistance Sitting-balance support: Feet supported Sitting balance-Leahy Scale: Fair Sitting balance - Comments: reliant on UE support, otherwise posterior lean   Standing balance support: Bilateral upper extremity supported Standing balance-Leahy Scale: Poor Standing balance comment: minA with UE support of PT                            Cognition Arousal/Alertness: Awake/alert Behavior During Therapy: WFL for tasks assessed/performed Overall Cognitive Status: Impaired/Different from baseline Area of Impairment: Awareness                           Awareness: Emergent Problem Solving: Requires verbal cues General Comments: Pt with improved awareness of deficits stating that his L side is incoordinated and weak and he has trouble "paying attention" to the L side. Pt also admitted that he is at risk for falls.  Exercises Other Exercises Other Exercises: Pt with elevated HR compared to 11/21. Initiated session with supine coordination exercises (LUE and LLE) with pt demonstrating improved control after 2-3 reps of each exercise. Demonstrated excellent ability with proprioception activity.    General Comments         Pertinent Vitals/Pain Pain Assessment: No/denies pain Faces Pain Scale: No hurt    Home Living                          Prior Function            PT Goals (current goals can now be found in the care plan section) Acute Rehab PT Goals Patient Stated Goal: to return to independence Time For Goal Achievement: 03/14/21 Potential to Achieve Goals: Fair Progress towards PT goals: Progressing toward goals    Frequency    Min 4X/week      PT Plan Current plan remains appropriate    Co-evaluation              AM-PAC PT "6 Clicks" Mobility   Outcome Measure  Help needed turning from your back to your side while in a flat bed without using bedrails?: A Little Help needed moving from lying on your back to sitting on the side of a flat bed without using bedrails?: A Little Help needed moving to and from a bed to a chair (including a wheelchair)?: A Lot Help needed standing up from a chair using your arms (e.g., wheelchair or bedside chair)?: A Lot Help needed to walk in hospital room?: A Little Help needed climbing 3-5 steps with a railing? : Total 6 Click Score: 14    End of Session Equipment Utilized During Treatment: Gait belt Activity Tolerance: Treatment limited secondary to medical complications (Comment) (elevated HR) Patient left: with call bell/phone within reach;with family/visitor present;in bed (bed alarm not working) Nurse Communication: Mobility status;Other (comment) (HR elevated to 145) PT Visit Diagnosis: Other abnormalities of gait and mobility (R26.89);Other symptoms and signs involving the nervous system (R29.898)     Time: 1450-1520 PT Time Calculation (min) (ACUTE ONLY): 30 min  Charges:  $Gait Training: 8-22 mins $Neuromuscular Re-education: 8-22 mins                      George [name], PT Acute Rehabilitation Services  Pager [phone] Office [phone]    George [name] 03/03/2021, 3:29 PM

## 2021-03-04 DIAGNOSIS — I6300 Cerebral infarction due to thrombosis of unspecified precerebral artery: Secondary | ICD-10-CM | POA: Diagnosis not present

## 2021-03-04 DIAGNOSIS — I4891 Unspecified atrial fibrillation: Secondary | ICD-10-CM | POA: Diagnosis not present

## 2021-03-04 DIAGNOSIS — F3341 Major depressive disorder, recurrent, in partial remission: Secondary | ICD-10-CM

## 2021-03-04 DIAGNOSIS — G620 Drug-induced polyneuropathy: Secondary | ICD-10-CM | POA: Diagnosis not present

## 2021-03-04 DIAGNOSIS — N1832 Chronic kidney disease, stage 3b: Secondary | ICD-10-CM

## 2021-03-04 DIAGNOSIS — E538 Deficiency of other specified B group vitamins: Secondary | ICD-10-CM | POA: Diagnosis not present

## 2021-03-04 LAB — CBC
HCT: 31.3 % — ABNORMAL LOW (ref 39.0–52.0)
Hemoglobin: 10.3 g/dL — ABNORMAL LOW (ref 13.0–17.0)
MCH: 29.3 pg (ref 26.0–34.0)
MCHC: 32.9 g/dL (ref 30.0–36.0)
MCV: 89.2 fL (ref 80.0–100.0)
Platelets: 282 10*3/uL (ref 150–400)
RBC: 3.51 MIL/uL — ABNORMAL LOW (ref 4.22–5.81)
RDW: 13.7 % (ref 11.5–15.5)
WBC: 7.9 10*3/uL (ref 4.0–10.5)
nRBC: 0 % (ref 0.0–0.2)

## 2021-03-04 LAB — MAGNESIUM: Magnesium: 1.9 mg/dL (ref 1.7–2.4)

## 2021-03-04 LAB — BASIC METABOLIC PANEL
Anion gap: 8 (ref 5–15)
BUN: 12 mg/dL (ref 8–23)
CO2: 21 mmol/L — ABNORMAL LOW (ref 22–32)
Calcium: 8.5 mg/dL — ABNORMAL LOW (ref 8.9–10.3)
Chloride: 109 mmol/L (ref 98–111)
Creatinine, Ser: 1.64 mg/dL — ABNORMAL HIGH (ref 0.61–1.24)
GFR, Estimated: 43 mL/min — ABNORMAL LOW (ref >60)
Glucose, Bld: 87 mg/dL (ref 70–99)
Potassium: 3.3 mmol/L — ABNORMAL LOW (ref 3.5–5.1)
Sodium: 138 mmol/L (ref 135–145)

## 2021-03-04 LAB — TSH: TSH: 1.955 u[IU]/mL (ref 0.350–4.500)

## 2021-03-04 MED ORDER — MELATONIN 3 MG PO TABS
3.0000 mg | ORAL_TABLET | Freq: Every day | ORAL | Status: DC
  Administered 2021-03-04: 3 mg via ORAL
  Filled 2021-03-04: qty 1

## 2021-03-04 MED ORDER — POTASSIUM CHLORIDE CRYS ER 20 MEQ PO TBCR
40.0000 meq | EXTENDED_RELEASE_TABLET | ORAL | Status: AC
  Administered 2021-03-04 (×2): 40 meq via ORAL
  Filled 2021-03-04 (×3): qty 2

## 2021-03-04 NOTE — Progress Notes (Signed)
Occupational Therapy Treatment Patient Details Name: George [name]. MRN: [id number] DOB: [birth year] Today's Date: 03/04/2021   History of present illness 75 y.o. male presents to [facility] hospital on 02/27/2021 with HA and L weakness. L hemianopsia also noted in ED. CT head revealed subacute R PCA infarct with small foci of petechial hemorrhage, as well as small infarct in parietal lobe. PMH includes colon cancer s/p resection, prostate cancer, anxiety/depression, osteoarthritis of bilateral knees.   OT comments  Patient continues to make steady progress towards goals in skilled OT session. Patient's session encompassed  visual tasks sitting EOB to further assess left neglect. Patient provided with menu to attempt to locate therapist stated items, with need for significant increased time, and further prompting due to STM deficits. Therapist providing intervention by occluding parts of the menu, with patient unable to locate. With therapist pointing to items, patient able to locate and read off items to therapist. Peripheral vision assessed, with significant deficit noted on L side (patient able to locate therapists gloved finger when in central vision. Therapist providing family education for different activities to work on with patient to improve visual awareness. Discharge remains appropriate, therapy will continue to follow while in house.    Recommendations for follow up therapy are one component of a multi-disciplinary discharge planning process, led by the attending physician.  Recommendations may be updated based on patient status, additional functional criteria and insurance authorization.    Follow Up Recommendations  Acute inpatient rehab (3hours/day)    Assistance Recommended at Discharge Frequent or constant Supervision/Assistance  Equipment Recommendations  BSC/3in1;Other (comment) (defer to next venue)    Recommendations for Other Services      Precautions / Restrictions  Precautions Precautions: Fall;Other (comment) Precaution Comments: L inattention Restrictions Weight Bearing Restrictions: No       Mobility Bed Mobility Overal bed mobility: Needs Assistance Bed Mobility: Supine to Sit     Supine to sit: Supervision     General bed mobility comments: verbal cues for LLE and LUE as coming off the bed (leaving them behind)    Transfers                         Balance Overall balance assessment: Needs assistance Sitting-balance support: Feet supported Sitting balance-Leahy Scale: Fair Sitting balance - Comments: reliant on UE support, otherwise posterior lean                                   ADL either performed or assessed with clinical judgement   ADL Overall ADL's : Needs assistance/impaired                                       General ADL Comments: session focus on visual tasks, declining ADLs this session    Extremity/Trunk Assessment              Vision   Visual Fields: Impaired-to be further tested in functional context;Left visual field deficit Additional Comments: given menu to locate food items, unable to find items on L without assist from therapist (blocking off sections of menu, pointing to various categories) peripheral field assessed with significant loss noted on L side   Perception     Praxis      Cognition Arousal/Alertness: Awake/alert Behavior During Therapy: [facility] [place] for tasks assessed/performed  Overall Cognitive Status: Impaired/Different from baseline Area of Impairment: Awareness;Following commands;Attention;Memory;Safety/judgement;Problem solving                   Current Attention Level: Sustained Memory: Decreased short-term memory Following Commands: Follows one step commands with increased time Safety/Judgement: Decreased awareness of deficits Awareness: Emergent Problem Solving: Requires verbal cues General Comments: difficulty with one step  tasks when provided activity that challenged vision          Exercises     Shoulder Instructions       General Comments      Pertinent Vitals/ Pain       Pain Assessment: Faces Faces Pain Scale: No hurt  Home Living                                          Prior Functioning/Environment              Frequency  Min 2X/week        Progress Toward Goals  OT Goals(current goals can now be found in the care plan section)  Progress towards OT goals: Progressing toward goals  Acute Rehab OT Goals Patient Stated Goal: to go to rehab OT Goal Formulation: With patient Time For Goal Achievement: 03/14/21 Potential to Achieve Goals: Good  Plan Discharge plan remains appropriate    Co-evaluation                 AM-PAC OT "6 Clicks" Daily Activity     Outcome Measure   Help from another person eating meals?: A Little Help from another person taking care of personal grooming?: A Little Help from another person toileting, which includes using toliet, bedpan, or urinal?: A Lot Help from another person bathing (including washing, rinsing, drying)?: A Lot Help from another person to put on and taking off regular upper body clothing?: A Little Help from another person to put on and taking off regular lower body clothing?: A Lot 6 Click Score: 15    End of Session    OT Visit Diagnosis: Unsteadiness on feet (R26.81);Other abnormalities of gait and mobility (R26.89);Muscle weakness (generalized) (M62.81);Other symptoms and signs involving cognitive function;Hemiplegia and hemiparesis Hemiplegia - caused by: Nontraumatic intracerebral hemorrhage   Activity Tolerance Patient tolerated treatment well   Patient Left in bed;with call bell/phone within reach;with family/visitor present   Nurse Communication Mobility status        Time: 7893-8101 OT Time Calculation (min): 19 min  Charges: OT General Charges $OT Visit: 1 Visit OT  Treatments $Self Care/Home Management : 8-22 mins  Corinne Ports E. Devyn Griffing, COTA/L Acute Rehabilitation Services [phone] [place] 03/04/2021, 12:38 PM

## 2021-03-04 NOTE — Progress Notes (Signed)
Physical Therapy Treatment Patient Details Name: George [name]. MRN: [id number] DOB: 1946-02-09 Today's Date: 03/04/2021   History of Present Illness 75 y.o. male presents to [facility] hospital on 02/27/2021 with HA and L weakness. L hemianopsia also noted in ED. CT head revealed subacute R PCA infarct with small foci of petechial hemorrhage, as well as small infarct in parietal lobe. PMH includes colon cancer s/p resection, prostate cancer, anxiety/depression, osteoarthritis of bilateral knees.    PT Comments    Pt tolerates treatment well with improved ambulation distances. Pt demonstrates L foot drag and continued L inattention, but is able to follow cues to improve consistency of L foot clearance. Pt will benefit from continued aggressive mobilization to improve awareness of L side and environment and to reduce falls risk.   Recommendations for follow up therapy are one component of a multi-disciplinary discharge planning process, led by the attending physician.  Recommendations may be updated based on patient status, additional functional criteria and insurance authorization.  Follow Up Recommendations  Acute inpatient rehab (3hours/day)     Assistance Recommended at Discharge Frequent or constant Supervision/Assistance  Equipment Recommendations  Rolling walker (2 wheels)    Recommendations for Other Services       Precautions / Restrictions Precautions Precautions: Fall;Other (comment) Precaution Comments: L inattention Restrictions Weight Bearing Restrictions: No     Mobility  Bed Mobility Overal bed mobility: Needs Assistance Bed Mobility: Supine to Sit     Supine to sit: Supervision     General bed mobility comments: verbal cues for LLE and LUE as coming off the bed (leaving them behind)    Transfers Overall transfer level: Needs assistance Equipment used: Rolling walker (2 wheels) Transfers: Sit to/from Stand;Bed to chair/wheelchair/BSC Sit to Stand: Min  assist;From elevated surface     Step pivot transfers: Min assist          Ambulation/Gait Ambulation/Gait assistance: Min assist Gait Distance (Feet): 50 Feet (50' x 2 trials) Assistive device: Rolling walker (2 wheels) Gait Pattern/deviations: Step-through pattern;Decreased dorsiflexion - left Gait velocity: reduced Gait velocity interpretation: <1.8 ft/sec, indicate of risk for recurrent falls   General Gait Details: pt with slowed step-through gait, reduced dorsi-flexion of L foot resulting in L foot drag and catching. PT provides cues to maintain BOS within walker as pt initially with walker drifting off to R side   Stairs             Wheelchair Mobility    Modified Rankin (Stroke Patients Only) Modified Rankin (Stroke Patients Only) Pre-Morbid Rankin Score: No symptoms Modified Rankin: Moderately severe disability     Balance Overall balance assessment: Needs assistance Sitting-balance support: Feet supported Sitting balance-Leahy Scale: Fair Sitting balance - Comments: reliant on UE support, otherwise posterior lean   Standing balance support: Bilateral upper extremity supported Standing balance-Leahy Scale: Poor Standing balance comment: minG with UE support of walker                            Cognition Arousal/Alertness: Awake/alert Behavior During Therapy: WFL for tasks assessed/performed Overall Cognitive Status: Impaired/Different from baseline Area of Impairment: Awareness;Following commands;Attention;Memory;Safety/judgement;Problem solving                 Orientation Level: Disoriented to;Time Current Attention Level: Sustained Memory: Decreased short-term memory Following Commands: Follows one step commands with increased time Safety/Judgement: Decreased awareness of deficits Awareness: Emergent Problem Solving: Requires verbal cues General Comments: difficulty with one step tasks  when provided activity that challenged  vision        Exercises      General Comments General comments (skin integrity, edema, etc.): tachy up to 137 on monitor, HR labile throughout session, possibly picking up artifact?      Pertinent Vitals/Pain Pain Assessment: Faces Faces Pain Scale: No hurt    Home Living                          Prior Function            PT Goals (current goals can now be found in the care plan section) Acute Rehab PT Goals Patient Stated Goal: to return to independence Progress towards PT goals: Progressing toward goals    Frequency    Min 4X/week      PT Plan Current plan remains appropriate    Co-evaluation              AM-PAC PT "6 Clicks" Mobility   Outcome Measure  Help needed turning from your back to your side while in a flat bed without using bedrails?: A Little Help needed moving from lying on your back to sitting on the side of a flat bed without using bedrails?: A Little Help needed moving to and from a bed to a chair (including a wheelchair)?: A Little Help needed standing up from a chair using your arms (e.g., wheelchair or bedside chair)?: A Little Help needed to walk in hospital room?: A Little Help needed climbing 3-5 steps with a railing? : Total 6 Click Score: 16    End of Session   Activity Tolerance: Patient tolerated treatment well Patient left: in chair;with call bell/phone within reach;with chair alarm set Nurse Communication: Mobility status PT Visit Diagnosis: Other abnormalities of gait and mobility (R26.89);Other symptoms and signs involving the nervous system (R29.898)     Time: 1040-1107 PT Time Calculation (min) (ACUTE ONLY): 27 min  Charges:  $Gait Training: 8-22 mins $Therapeutic Activity: 8-22 mins                     Zenaida Niece, PT, DPT Acute Rehabilitation Pager: [phone] Office [place] 03/04/2021, 1:06 PM

## 2021-03-04 NOTE — Progress Notes (Signed)
PROGRESS NOTE  George [name]. PZW:[id number] DOB: 12/28/1945   PCP: Charolette Forward, MD  Patient is from: Home  DOA: 02/27/2021 LOS: 5  Chief complaints:  Chief Complaint  Patient presents with   Stroke Symptoms     Brief Narrative / Interim history: 75 year old M with PMH of colon cancer s/p resection, prostate cancer, anxiety, depression, bilateral knee OA and neuropathy presenting with left hemiparesis and left visual defect/hemianopsia, and found to have subacute right PCA CVA with P2 branch occlusion, and A. fib with RVR.  Patient was not able to have an MRI due to metallic material in left thigh.  TTE basically normal.  Neurology recommended anticoagulation with Eliquis.  Hospital course complicated by panic attacks/confusion.  Psychiatry consulted and recommended outpatient follow-up  Therapy recommended CIR.  Subjective: Seen and examined earlier this morning.  No major events overnight of this morning.  Patient's wife at bedside.  She is concerned about anxiety and asking when psychiatry will be able to see him.  Reports improvement in his weakness and vision.  He denies chest pain, dyspnea, GI or UTI symptoms.   Objective: Vitals:   03/04/21 0510 03/04/21 0726 03/04/21 0829 03/04/21 1156  BP: 134/79 (!) 140/92  (!) 141/82  Pulse: 85 85 (!) 108 86  Resp: 16 16  20   Temp: 98.2 F (36.8 C) 97.8 F (36.6 C)  98.2 F (36.8 C)  TempSrc: Oral Oral  Oral  SpO2:  96%  92%  Weight:      Height:        Intake/Output Summary (Last 24 hours) at 03/04/2021 1458 Last data filed at 03/04/2021 0741 Gross per 24 hour  Intake --  Output 975 ml  Net -975 ml   Filed Weights   02/27/21 1949 03/01/21 0400  Weight: 89.2 kg 93.1 kg    Examination:  GENERAL: No apparent distress.  Nontoxic. HEENT: MMM.  Vision and hearing grossly intact.  NECK: Supple.  No apparent JVD.  RESP: On RA.  No IWOB.  Fair aeration bilaterally. CVS: Irregular rhythm.  Normal rate.  Heart sounds  normal.  ABD/GI/GU: BS+. Abd soft, NTND.  MSK/EXT:  Moves extremities. No apparent deformity. No edema.  SKIN: no apparent skin lesion or wound NEURO: Awake, alert and oriented x4 but some difficulty comprehending.  Seems to have left superior quadrantanopia.  Finger-to-nose off on the left.  Pronator drift on the left.  Motor seems to be intact and symmetric in both extremities PSYCH: Calm. Normal affect.   Procedures:  None  Microbiology summarized: [id number]-61 and influenza PCR nonreactive. Urine culture NGTD.  Assessment & Plan: Acute right PCA CVA with P2 branch occlusion and a small petechial hemorrhage.  Felt to be embolic in the setting of A. fib with RVR.  TTE within normal.  Not MRI candidate due to metabolic object in left eye.  LDL 58.  A1c 5.8.  UDS negative.  Still with left upper quadrantanopia, left pronator drift and left upper extremity dysmetria -Continue Eliquis per neurology -Therapy recommended CIR.  New onset A. fib with RVR: RVR resolved.  TTE without shunt or blood clot.  TSH within normal. -Continue metoprolol and Eliquis -Optimize K and Mg   Prolonged QTc -Optimize K and Mg -Minimize or avoid QT prolonging drugs   Hypokalemia/hypomagnesemia: K3.3.  Mg 1.9. -P.o. K-Dur 40 mEq x 2.  CKD-3B: Stable.  AKI ruled out. Recent Labs    02/27/21 4431 02/27/21 1945 02/28/21 0027 03/01/21 0033 03/02/21 5400 03/03/21 0055 03/04/21 8676  BUN 12  --  9 19 15 13 12   CREATININE 1.63* 1.60* 1.60* 2.05* 1.72* 1.62* 1.64*  -Continue monitoring   History of colon and prostate cancer/chemo induced peripheral neuropathy   B12 deficiency: Vitamin B12 124. -Continue vitamin B12 injection followed by p.o.   Anxiety and depression:  Patient's wife concerned about his anxiety.  Patient is on significant dose of Wellbutrin which is not a great choice in the setting of anxiety.  Psychiatry consulted and recommended outpatient follow-up.   Body mass index is 27.08  kg/m.         DVT prophylaxis:  Place and maintain sequential compression device Start: 02/27/21 2013 apixaban (ELIQUIS) tablet 5 mg  Code Status: Full code Family Communication: Updated patient's wife at bedside. Level of care: Progressive.  Change to telemetry Status is: Inpatient  Remains inpatient appropriate because: Safe disposition/CIR       Consultants:  Neurology Psychiatry   Sch Meds:  Scheduled Meds:  apixaban  5 mg Oral BID   atorvastatin  80 mg Oral Daily   buPROPion  450 mg Oral q morning   Chlorhexidine Gluconate Cloth  6 each Topical Daily   cyanocobalamin  1,000 mcg Intramuscular Daily   metoprolol succinate  125 mg Oral Daily   [START ON 03/07/2021] vitamin B-12  1,000 mcg Oral Daily   Vitamin D (Ergocalciferol)  50,000 Units Oral Q7 days   Continuous Infusions: PRN Meds:.acetaminophen **OR** acetaminophen (TYLENOL) oral liquid 160 mg/5 mL **OR** acetaminophen, HYDROcodone-acetaminophen, hydrOXYzine, metoprolol tartrate  Antimicrobials: Anti-infectives (From admission, onward)    None        I have personally reviewed the following labs and images: CBC: Recent Labs  Lab 02/27/21 0937 02/27/21 1945 03/01/21 0033 03/02/21 0733 03/04/21 0108  WBC 10.4 10.2 10.3 10.4 7.9  NEUTROABS 7.6  --   --   --   --   HGB 13.4 13.6 10.9* 11.6* 10.3*  HCT 41.6 43.8 33.7* 36.0* 31.3*  MCV 89.8 89.9 89.9 91.6 89.2  PLT 294 306 269 249 282   BMP &GFR Recent Labs  Lab 02/27/21 0937 02/27/21 1945 02/28/21 0027 03/01/21 0033 03/02/21 0733 03/03/21 0055 03/04/21 0108  NA 142  --  137 136 140 136 138  K 3.2*  --  3.2* 3.2* 3.6 3.3* 3.3*  CL 107  --  107 107 109 110 109  CO2 22  --  23 22 21* 19* 21*  GLUCOSE 97  --  119* 118* 88 88 87  BUN 12  --  9 19 15 13 12   CREATININE 1.63*   < > 1.60* 2.05* 1.72* 1.62* 1.64*  CALCIUM 9.9  --  8.8* 8.3* 8.7* 8.4* 8.5*  MG 2.1  --  2.3  --   --   --  1.9   < > = values in this interval not displayed.    Estimated Creatinine Clearance: 42.6 mL/min (A) (by C-G formula based on SCr of 1.64 mg/dL (H)). Liver & Pancreas: Recent Labs  Lab 02/27/21 0937  AST 24  ALT 13  ALKPHOS 76  BILITOT 0.6  PROT 8.4*  ALBUMIN 4.7   No results for input(s): LIPASE, AMYLASE in the last 168 hours. No results for input(s): AMMONIA in the last 168 hours. Diabetic: No results for input(s): HGBA1C in the last 72 hours. Recent Labs  Lab 02/27/21 0942 03/01/21 1241 03/01/21 1651  GLUCAP 102* 87 111*   Cardiac Enzymes: No results for input(s): CKTOTAL, CKMB, CKMBINDEX, TROPONINI in the last  168 hours. No results for input(s): PROBNP in the last 8760 hours. Coagulation Profile: Recent Labs  Lab 02/27/21 0937  INR 1.0   Thyroid Function Tests: Recent Labs    03/04/21 1106  TSH 1.955   Lipid Profile: No results for input(s): CHOL, HDL, LDLCALC, TRIG, CHOLHDL, LDLDIRECT in the last 72 hours. Anemia Panel: No results for input(s): VITAMINB12, FOLATE, FERRITIN, TIBC, IRON, RETICCTPCT in the last 72 hours. Urine analysis:    Component Value Date/Time   COLORURINE AMBER (A) 03/01/2021 1002   APPEARANCEUR HAZY (A) 03/01/2021 1002   LABSPEC 1.030 03/01/2021 1002   PHURINE 5.0 03/01/2021 1002   GLUCOSEU NEGATIVE 03/01/2021 1002   HGBUR NEGATIVE 03/01/2021 1002   [place] 03/01/2021 1002   [place] 03/01/2021 1002   PROTEINUR 30 (A) 03/01/2021 1002   UROBILINOGEN 0.2 02/16/2009 1615   NITRITE NEGATIVE 03/01/2021 1002   LEUKOCYTESUR NEGATIVE 03/01/2021 1002   Sepsis Labs: Invalid input(s): PROCALCITONIN, [place]  Microbiology: Recent Results (from the past 240 hour(s))  Resp Panel by RT-PCR (Flu A&B, Covid) Nasopharyngeal Swab     Status: None   Collection Time: 02/27/21  9:47 AM   Specimen: Nasopharyngeal Swab; Nasopharyngeal(NP) swabs in vial transport medium  Result Value Ref Range Status   SARS Coronavirus 2 by RT PCR NEGATIVE NEGATIVE Final    Comment:  (NOTE) SARS-CoV-2 target nucleic acids are NOT DETECTED.  The SARS-CoV-2 RNA is generally detectable in upper respiratory specimens during the acute phase of infection. The lowest concentration of SARS-CoV-2 viral copies this assay can detect is 138 copies/mL. A negative result does not preclude SARS-Cov-2 infection and should not be used as the sole basis for treatment or other patient management decisions. A negative result may occur with  improper specimen collection/handling, submission of specimen other than nasopharyngeal swab, presence of viral mutation(s) within the areas targeted by this assay, and inadequate number of viral copies(<138 copies/mL). A negative result must be combined with clinical observations, patient history, and epidemiological information. The expected result is Negative.  Fact Sheet for Patients:  EntrepreneurPulse.com.au  Fact Sheet for Healthcare Providers:  IncredibleEmployment.be  This test is no t yet approved or cleared by the [place] FDA and  has been authorized for detection and/or diagnosis of SARS-CoV-2 by FDA under an Emergency Use Authorization (EUA). This EUA will remain  in effect (meaning this test can be used) for the duration of the COVID-19 declaration under Section 564(b)(1) of the Act, 21 U.S.C.section 360bbb-3(b)(1), unless the authorization is terminated  or revoked sooner.       Influenza A by PCR NEGATIVE NEGATIVE Final   Influenza B by PCR NEGATIVE NEGATIVE Final    Comment: (NOTE) The Xpert Xpress SARS-CoV-2/FLU/RSV plus assay is intended as an aid in the diagnosis of influenza from Nasopharyngeal swab specimens and should not be used as a sole basis for treatment. Nasal washings and aspirates are unacceptable for Xpert Xpress SARS-CoV-2/FLU/RSV testing.  Fact Sheet for Patients: EntrepreneurPulse.com.au  Fact Sheet for Healthcare  Providers: IncredibleEmployment.be  This test is not yet approved or cleared by the [place] FDA and has been authorized for detection and/or diagnosis of SARS-CoV-2 by FDA under an Emergency Use Authorization (EUA). This EUA will remain in effect (meaning this test can be used) for the duration of the COVID-19 declaration under Section 564(b)(1) of the Act, 21 U.S.C. section 360bbb-3(b)(1), unless the authorization is terminated or revoked.  Performed at [facility], [address], [place], [place] [address]  Urine Culture     Status: None   Collection Time: 03/01/21 10:09 AM   Specimen: Urine, Clean Catch  Result Value Ref Range Status   Specimen Description URINE, CLEAN CATCH  Final   Special Requests NONE  Final   Culture   Final    NO GROWTH Performed at [facility], [address] [place], [place] [address]    Report Status 03/02/2021 FINAL  Final    Radiology Studies: No results found.     Gustava Berland T. [place]  If 7PM-7AM, please contact night-coverage [email or web address] 03/04/2021, 2:58 PM

## 2021-03-04 NOTE — Progress Notes (Addendum)
Inpatient Rehab Admissions Coordinator:   I have a bed for this pt to admit to CIR on Thursday (11/24).  Dr. Cyndia Skeeters in agreement.  Rehab MD (Dr. Letta Pate or [place]) to assess pt and confirm admission on Thursday.  Floor RN can call CIR at [phone] for report after 12pm on Thursday.  I have let pt/family and case manager know.    Shann Medal, PT, DPT Admissions Coordinator [phone] 03/04/21  10:07 AM

## 2021-03-04 NOTE — Plan of Care (Signed)
  Problem: Education: Goal: Knowledge of disease or condition will improve 03/04/2021 1540 by Rubin Payor, RN Outcome: Progressing 03/04/2021 1540 by Rubin Payor, RN Outcome: Progressing Goal: Knowledge of secondary prevention will improve (SELECT ALL) 03/04/2021 1540 by Rubin Payor, RN Outcome: Progressing 03/04/2021 1540 by Rubin Payor, RN Outcome: Progressing Goal: Knowledge of patient specific risk factors will improve (INDIVIDUALIZE FOR PATIENT) 03/04/2021 1540 by Rubin Payor, RN Outcome: Progressing 03/04/2021 1540 by Rubin Payor, RN Outcome: Progressing   Problem: Coping: Goal: Will verbalize positive feelings about self 03/04/2021 1540 by Rubin Payor, RN Outcome: Progressing 03/04/2021 1540 by Rubin Payor, RN Outcome: Progressing Goal: Will identify appropriate support needs 03/04/2021 1540 by Rubin Payor, RN Outcome: Progressing 03/04/2021 1540 by Rubin Payor, RN Outcome: Progressing   Problem: Health Behavior/Discharge Planning: Goal: Ability to manage health-related needs will improve 03/04/2021 1540 by Rubin Payor, RN Outcome: Progressing 03/04/2021 1540 by Rubin Payor, RN Outcome: Progressing   Problem: Self-Care: Goal: Ability to participate in self-care as condition permits will improve 03/04/2021 1540 by Rubin Payor, RN Outcome: Progressing 03/04/2021 1540 by Rubin Payor, RN Outcome: Progressing Goal: Verbalization of feelings and concerns over difficulty with self-care will improve 03/04/2021 1540 by Rubin Payor, RN Outcome: Progressing 03/04/2021 1540 by Rubin Payor, RN Outcome: Progressing Goal: Ability to communicate needs accurately will improve 03/04/2021 1540 by Rubin Payor, RN Outcome: Progressing 03/04/2021 1540 by Rubin Payor, RN Outcome: Progressing   Problem: Nutrition: Goal: Risk of aspiration will decrease 03/04/2021 1540 by  Rubin Payor, RN Outcome: Progressing 03/04/2021 1540 by Rubin Payor, RN Outcome: Progressing Goal: Dietary intake will improve 03/04/2021 1540 by Rubin Payor, RN Outcome: Progressing 03/04/2021 1540 by Rubin Payor, RN Outcome: Progressing   Problem: Intracerebral Hemorrhage Tissue Perfusion: Goal: Complications of Intracerebral Hemorrhage will be minimized 03/04/2021 1540 by Rubin Payor, RN Outcome: Progressing 03/04/2021 1540 by Rubin Payor, RN Outcome: Progressing   Problem: Ischemic Stroke/TIA Tissue Perfusion: Goal: Complications of ischemic stroke/TIA will be minimized 03/04/2021 1540 by Rubin Payor, RN Outcome: Progressing 03/04/2021 1540 by Rubin Payor, RN Outcome: Progressing   Problem: Spontaneous Subarachnoid Hemorrhage Tissue Perfusion: Goal: Complications of Spontaneous Subarachnoid Hemorrhage will be minimized 03/04/2021 1540 by Rubin Payor, RN Outcome: Progressing 03/04/2021 1540 by Rubin Payor, RN Outcome: Progressing

## 2021-03-04 NOTE — Consult Note (Addendum)
[facility]  Face-to-Face Psychiatry Consult   Reason for Consult:  Depression Referring Physician:  Carney Harder, MD Patient Identification: George [name]. MRN:  [id number] Principal Diagnosis: CVA (cerebral vascular accident) [facility]) Diagnosis:  Principal Problem:   CVA (cerebral vascular accident) ([place]) Active Problems:   Generalized anxiety disorder   Chemotherapy-induced neuropathy ([place])   B12 deficiency   Atrial fibrillation with RVR (HCC)   Hypokalemia   Prolonged QT interval   Total Time spent with patient: 45 minutes  Subjective:   George [name]. is a 76 y.o. male patient admitted with CVA.  HPI:  Patient is resting comfortably in his bed, doing foot exercises. Patient reported he was comfortable with his nephew staying in the room for his assessment.   Patient reported that he has believes he had a panic attack the day after his CVA. Patient reported that he felt palpitations and overwhelming anxiety that day and told his wife about it. Patient reports that he has been concerned that he may have missed a few doses of his Wellbutrin since being hospitalized. Patient reports that prior to his CVA he was doing well and taking his Wellbutrin daily. Patient reports that he does not do well if he misses a dose and does not recall having a panic attack in "years." Patient reports that he was not sleeping well prior to this CVA although he attributes this to not being as tired. Patient reports that he was used to working hard most of his life. Patient reports that he averages 4-5 hours a night. Patient reports that he has also found himself thinking about things "to fix or solve" prior to going to sleep, thus keeping im awake longer. Patient reports that he has noticed a slight decrease in energy with his decrease in sleep, but he remain able to do the things he enjoys including riding a motorcycle and traveling with his wife. Patient denies change in appetite, feelings of guilt,  worthlessness, and hopelessness. Patient denies SI, HI and AVH. Patient reports that he has not had feelings of being on edge or overly anxious lately and denies this prior to his CVA as well. Patient  reports he normally does not like to take medication and is not generally interested in adding to his current regimen, but he finds his Wellbutrin beneficial. Patient reported he would be willing to try a supplement for sleep as well as therapy outpatient.   Patient denies any previous symptoms of manic/hypomanic behavior. Patient also denied symptoms of PTSD.   Patient's nephew endorsed that he has not noted any concerns regarding mental health in patient over the past few months.   Past Psychiatric History: Depression and Anxiety, currently on Wellbutrin XL 450mg  Rx by his PCP  Risk to Self:  NO Risk to Others:  NO Prior Inpatient Therapy:  NO Prior Outpatient Therapy:  NO  Past Medical History:  Past Medical History:  Diagnosis Date   Anxiety    Arthritis    OSTEO IN KNEE   Asthma    as child   Colon cancer ([place])    Depression    Elevated prostate specific antigen (PSA)    Generalized anxiety disorder 04/09/2015   Hematospermia    Hx antineoplastic chemotherapy 2010   Hypertension    Idiopathic peripheral neuropathy    TOES OF BOTH FEET DUE TO CHEMO   Incomplete bladder emptying    Malignant neoplasm of prostate ([place])    Nodular prostate with lower urinary tract symptoms  Panic attacks 04/09/2015   Primary localized osteoarthrosis of the knee, right 05/19/2016   Prostatitis    S/P total knee arthroplasty, left 05/19/2016   Spermatocele    Weak urinary stream     Past Surgical History:  Procedure Laterality Date   COLON RESECTION   NOV 2009   COLONOSCOPY     PROSTATE BIOPSY     SPERMATOCELECTOMY Left 05/07/2014   Procedure: LEFT SPERMATOCELECTOMY;  Surgeon: Malka So, MD;  Location: [facility];  Service: Urology;  Laterality: Left;   TONSILLECTOMY     TOTAL KNEE  ARTHROPLASTY Left 04/21/2015   Procedure: LEFT TOTAL KNEE ARTHROPLASTY;  Surgeon: Elsie Saas, MD;  Location: [facility];  Service: Orthopedics;  Laterality: Left;   TOTAL KNEE ARTHROPLASTY Right 05/31/2016   Procedure: TOTAL KNEE ARTHROPLASTY;  Surgeon: Elsie Saas, MD;  Location: [facility];  Service: Orthopedics;  Laterality: Right;   TRANSURETHRAL RESECTION OF PROSTATE N/A 05/07/2014   Procedure: TRANSURETHRAL RESECTION OF THE PROSTATE WITH GYRUS INSTRUMENTS;  Surgeon: Malka So, MD;  Location: [facility];  Service: Urology;  Laterality: N/A;   Family History:  Family History  Problem Relation Age of Onset   Stroke Father    Cancer Brother        neoplasm of brain   Family Psychiatric  History: None endorsed Social History:  Social History   Substance and Sexual Activity  Alcohol Use Yes   Alcohol/week: 1.0 standard drink   Types: 1 Cans of beer per week   Comment: OCCASIONAL     Social History   Substance and Sexual Activity  Drug Use No    Social History   Socioeconomic History   Marital status: Married    Spouse name: Not on file   Number of children: 9   Years of education: Not on file   Highest education level: Not on file  Occupational History   Occupation: retire  Tobacco Use   Smoking status: Former    Packs/day: 0.25    Years: 3.00    Pack years: 0.75    Types: Cigarettes    Quit date: 04/12/1972    Years since quitting: 48.9   Smokeless tobacco: Never   Tobacco comments:    smoked 1 cigarette per day SOME DAYS  Vaping Use   Vaping Use: Never used  Substance and Sexual Activity   Alcohol use: Yes    Alcohol/week: 1.0 standard drink    Types: 1 Cans of beer per week    Comment: OCCASIONAL   Drug use: No   Sexual activity: Yes  Other Topics Concern   Not on file  Social History Narrative   Shawnie Dapper will take care of him after his surgery    Her cell is [phone]   Social Determinants of Health   Financial Resource Strain: Not on file  Food  Insecurity: Not on file  Transportation Needs: Not on file  Physical Activity: Not on file  Stress: Not on file  Social Connections: Not on file   Additional Social History:    Allergies:   Allergies  Allergen Reactions   Amlodipine Other (See Comments)    Dizziness    Gabapentin     hallucinations    Labs:  Results for orders placed or performed during the hospital encounter of 02/27/21 (from the past 48 hour(s))  Basic metabolic panel     Status: Abnormal   Collection Time: 03/03/21 12:55 AM  Result Value Ref Range   Sodium 136 135 -  145 mmol/L   Potassium 3.3 (L) 3.5 - 5.1 mmol/L   Chloride 110 98 - 111 mmol/L   CO2 19 (L) 22 - 32 mmol/L   Glucose, Bld 88 70 - 99 mg/dL    Comment: Glucose reference range applies only to samples taken after fasting for at least 8 hours.   BUN 13 8 - 23 mg/dL   Creatinine, Ser 1.62 (H) 0.61 - 1.24 mg/dL   Calcium 8.4 (L) 8.9 - 10.3 mg/dL   GFR, Estimated 43 (L) >60 mL/min    Comment: (NOTE) Calculated using the CKD-EPI Creatinine Equation (2021)    Anion gap 7 5 - 15    Comment: Performed at [facility] [address], [place], [place] [address]  CBC     Status: Abnormal   Collection Time: 03/04/21  1:08 AM  Result Value Ref Range   WBC 7.9 4.0 - 10.5 K/uL   RBC 3.51 (L) 4.22 - 5.81 MIL/uL   Hemoglobin 10.3 (L) 13.0 - 17.0 g/dL   HCT 31.3 (L) 39.0 - 52.0 %   MCV 89.2 80.0 - 100.0 fL   MCH 29.3 26.0 - 34.0 pg   MCHC 32.9 30.0 - 36.0 g/dL   RDW 13.7 11.5 - 15.5 %   Platelets 282 150 - 400 K/uL   nRBC 0.0 0.0 - 0.2 %    Comment: Performed at [facility], [place] [address], [place], [place] [address]  Basic metabolic panel     Status: Abnormal   Collection Time: 03/04/21  1:08 AM  Result Value Ref Range   Sodium 138 135 - 145 mmol/L   Potassium 3.3 (L) 3.5 - 5.1 mmol/L   Chloride 109 98 - 111 mmol/L   CO2 21 (L) 22 - 32 mmol/L   Glucose, Bld 87 70 - 99 mg/dL    Comment: Glucose reference range applies only to  samples taken after fasting for at least 8 hours.   BUN 12 8 - 23 mg/dL   Creatinine, Ser 1.64 (H) 0.61 - 1.24 mg/dL   Calcium 8.5 (L) 8.9 - 10.3 mg/dL   GFR, Estimated 43 (L) >60 mL/min    Comment: (NOTE) Calculated using the CKD-EPI Creatinine Equation (2021)    Anion gap 8 5 - 15    Comment: Performed at [facility] [address], [place], [place] [address]  Magnesium     Status: None   Collection Time: 03/04/21  1:08 AM  Result Value Ref Range   Magnesium 1.9 1.7 - 2.4 mg/dL    Comment: Performed at [facility] [address] [place], [place] [address]  TSH     Status: None   Collection Time: 03/04/21 11:06 AM  Result Value Ref Range   TSH 1.955 0.350 - 4.500 uIU/mL    Comment: Performed by a 3rd Generation assay with a functional sensitivity of <=0.01 uIU/mL. Performed at [facility], [place] [address], [place], [place] [address]     Current Facility-Administered Medications  Medication Dose Route Frequency Provider Last Rate Last Admin   acetaminophen (TYLENOL) tablet 650 mg  650 mg Oral Q4H PRN Guilford Shi, MD   650 mg at 03/01/21 2012   Or   acetaminophen (TYLENOL) 160 MG/5ML solution 650 mg  650 mg Per Tube Q4H PRN Guilford Shi, MD       Or   acetaminophen (TYLENOL) suppository 650 mg  650 mg Rectal Q4H PRN Guilford Shi, MD  apixaban (ELIQUIS) tablet 5 mg  5 mg Oral BID Regalado, Belkys A, MD   5 mg at 03/04/21 0830   atorvastatin (LIPITOR) tablet 80 mg  80 mg Oral Daily Guilford Shi, MD   80 mg at 03/04/21 0830   buPROPion (WELLBUTRIN XL) 24 hr tablet 450 mg  450 mg Oral q morning Guilford Shi, MD   450 mg at 03/04/21 3419   Chlorhexidine Gluconate Cloth 2 % PADS 6 each  6 each Topical Daily Regalado, Belkys A, MD   6 each at 03/03/21 0859   cyanocobalamin ((VITAMIN B-12)) injection 1,000 mcg  1,000 mcg Intramuscular Daily Regalado, Belkys A, MD   1,000 mcg at 03/04/21 0830   HYDROcodone-acetaminophen (NORCO/VICODIN)  5-325 MG per tablet 1 tablet  1 tablet Oral Q6H PRN Guilford Shi, MD   1 tablet at 03/03/21 0955   hydrOXYzine (ATARAX/VISTARIL) tablet 10 mg  10 mg Oral TID PRN Guilford Shi, MD   10 mg at 03/04/21 0829   melatonin tablet 3 mg  3 mg Oral Q2000 Damita Dunnings B, MD       metoprolol succinate (TOPROL-XL) 24 hr tablet 125 mg  125 mg Oral Daily Regalado, Belkys A, MD   125 mg at 03/04/21 0829   metoprolol tartrate (LOPRESSOR) injection 2.5 mg  2.5 mg Intravenous Q6H PRN Regalado, Belkys A, MD   2.5 mg at 03/03/21 1050   [START ON 03/07/2021] vitamin B-12 (CYANOCOBALAMIN) tablet 1,000 mcg  1,000 mcg Oral Daily Regalado, Belkys A, MD       Vitamin D (Ergocalciferol) (DRISDOL) capsule 50,000 Units  50,000 Units Oral Q7 days Guilford Shi, MD   50,000 Units at 02/27/21 2050   Psychiatric Specialty Exam:  Presentation  General Appearance: Appropriate for Environment Eye Contact:Good Speech:Clear and Coherent Speech Volume:Normal Handedness:No data recorded  Mood and Affect  Mood:Euthymic Affect:Congruent  Thought Process  Thought Processes:Coherent Descriptions of Associations:Intact Orientation:Full (Time, Place and Person) Thought Content:Logical History of Schizophrenia/Schizoaffective disorder:No data recorded Duration of Psychotic Symptoms:No data recorded Hallucinations:Hallucinations: None Ideas of Reference:None Suicidal Thoughts:Suicidal Thoughts: No Homicidal Thoughts:Homicidal Thoughts: No  Sensorium  Memory:Immediate Good; Recent Good; Remote Good Judgment:Fair Insight:Fair  Executive Functions  Concentration:Good Attention Span:Good Recall:No data recorded Fund of Knowledge:Good Language:Good  Psychomotor Activity  Psychomotor Activity:Psychomotor Activity: Normal  Assets  Assets:Communication Skills; Housing; Resilience; Social Support; Intimacy  Sleep  Sleep:Sleep: Fair  Physical Exam: Physical Exam Constitutional:      Appearance: Normal  appearance.  HENT:     Head: Normocephalic and atraumatic.  Eyes:     Extraocular Movements: Extraocular movements intact.  Pulmonary:     Effort: Pulmonary effort is normal.  Neurological:     Mental Status: He is alert and oriented to person, place, and time.   Review of Systems  Psychiatric/Behavioral:  Negative for depression, hallucinations and suicidal ideas.   Blood pressure (!) 141/82, pulse 86, temperature 98.2 F (36.8 C), temperature source Oral, resp. rate 20, height 6\' 1"  (1.854 m), weight 93.1 kg, SpO2 92 %. Body mass index is 27.08 kg/m.  Treatment Plan Summary: Mr. Starling is a 75 yo patient who presented w/ CVA. Psych was consulted due to concern for anxiety. Patient reported that he believes he had an anxiety attach the day after his CVA. Patient is on a notably high dose of Wellbutrin XL and it is possible that he did miss one dose on the day of the CVA and thus may have been having symptoms of discontinuation syndrome. Patient endorsed that  he had been stable for years on the medication and is already feeling less anxious over the past few days. Due to patient's high dose should patient wish to stop medication or transition to SSRI or SNRI patient would need to be titrated off his Wellbutrin over a few days. Patient at this time did not wish to discontinue his Wellbutrin and appeared to endorse symptom improvement. Patient was willing to try Melatonin for his insomnia. Patient reported he was more willing to try this as it was more "natural." Would not recommend PRN benzo's in this patient due to age. Another choice would be trazodone if patient was willing to try and melatonin was not beneficial.   Depression and Anxiety - Continue Wellbutrin XL 450mg  daily - Start Melatonin 3mg , insomnia - Recommend SW consult for OP therapy  Thank you for this consult. We will sign off at this time.   Disposition:  Per primary   PGY-2 Freida Busman, MD 03/04/2021 4:04 PM

## 2021-03-05 ENCOUNTER — Inpatient Hospital Stay (HOSPITAL_COMMUNITY)
Admission: RE | Admit: 2021-03-05 | Discharge: 2021-03-14 | DRG: 057 | Disposition: A | Discharge status: Home Health | Payer: Medicare Other | Source: Intra-hospital | Attending: Physical Medicine and Rehabilitation | Admitting: Physical Medicine and Rehabilitation

## 2021-03-05 ENCOUNTER — Other Ambulatory Visit: Payer: Self-pay

## 2021-03-05 ENCOUNTER — Encounter (HOSPITAL_COMMUNITY): Payer: Self-pay | Admitting: Physical Medicine and Rehabilitation

## 2021-03-05 DIAGNOSIS — Z87891 Personal history of nicotine dependence: Secondary | ICD-10-CM

## 2021-03-05 DIAGNOSIS — F411 Generalized anxiety disorder: Secondary | ICD-10-CM | POA: Diagnosis present

## 2021-03-05 DIAGNOSIS — I69354 Hemiplegia and hemiparesis following cerebral infarction affecting left non-dominant side: Secondary | ICD-10-CM | POA: Diagnosis not present

## 2021-03-05 DIAGNOSIS — R110 Nausea: Secondary | ICD-10-CM | POA: Diagnosis present

## 2021-03-05 DIAGNOSIS — Z96653 Presence of artificial knee joint, bilateral: Secondary | ICD-10-CM | POA: Diagnosis present

## 2021-03-05 DIAGNOSIS — I6300 Cerebral infarction due to thrombosis of unspecified precerebral artery: Secondary | ICD-10-CM | POA: Diagnosis not present

## 2021-03-05 DIAGNOSIS — E785 Hyperlipidemia, unspecified: Secondary | ICD-10-CM | POA: Diagnosis not present

## 2021-03-05 DIAGNOSIS — Z8546 Personal history of malignant neoplasm of prostate: Secondary | ICD-10-CM

## 2021-03-05 DIAGNOSIS — Z888 Allergy status to other drugs, medicaments and biological substances status: Secondary | ICD-10-CM | POA: Diagnosis not present

## 2021-03-05 DIAGNOSIS — Z808 Family history of malignant neoplasm of other organs or systems: Secondary | ICD-10-CM | POA: Diagnosis not present

## 2021-03-05 DIAGNOSIS — F32A Depression, unspecified: Secondary | ICD-10-CM | POA: Diagnosis present

## 2021-03-05 DIAGNOSIS — Z9049 Acquired absence of other specified parts of digestive tract: Secondary | ICD-10-CM

## 2021-03-05 DIAGNOSIS — F3341 Major depressive disorder, recurrent, in partial remission: Secondary | ICD-10-CM

## 2021-03-05 DIAGNOSIS — F05 Delirium due to known physiological condition: Secondary | ICD-10-CM | POA: Diagnosis not present

## 2021-03-05 DIAGNOSIS — I69392 Facial weakness following cerebral infarction: Secondary | ICD-10-CM | POA: Diagnosis not present

## 2021-03-05 DIAGNOSIS — I10 Essential (primary) hypertension: Secondary | ICD-10-CM

## 2021-03-05 DIAGNOSIS — I4891 Unspecified atrial fibrillation: Secondary | ICD-10-CM | POA: Diagnosis present

## 2021-03-05 DIAGNOSIS — I69393 Ataxia following cerebral infarction: Secondary | ICD-10-CM | POA: Diagnosis not present

## 2021-03-05 DIAGNOSIS — H5340 Unspecified visual field defects: Secondary | ICD-10-CM | POA: Diagnosis present

## 2021-03-05 DIAGNOSIS — F410 Panic disorder [episodic paroxysmal anxiety] without agoraphobia: Secondary | ICD-10-CM | POA: Diagnosis present

## 2021-03-05 DIAGNOSIS — E538 Deficiency of other specified B group vitamins: Secondary | ICD-10-CM | POA: Diagnosis not present

## 2021-03-05 DIAGNOSIS — Z85038 Personal history of other malignant neoplasm of large intestine: Secondary | ICD-10-CM

## 2021-03-05 DIAGNOSIS — G620 Drug-induced polyneuropathy: Secondary | ICD-10-CM | POA: Diagnosis not present

## 2021-03-05 DIAGNOSIS — I69398 Other sequelae of cerebral infarction: Secondary | ICD-10-CM | POA: Diagnosis not present

## 2021-03-05 DIAGNOSIS — I639 Cerebral infarction, unspecified: Secondary | ICD-10-CM | POA: Diagnosis present

## 2021-03-05 DIAGNOSIS — Z79899 Other long term (current) drug therapy: Secondary | ICD-10-CM | POA: Diagnosis not present

## 2021-03-05 DIAGNOSIS — R040 Epistaxis: Secondary | ICD-10-CM | POA: Diagnosis present

## 2021-03-05 DIAGNOSIS — Z9221 Personal history of antineoplastic chemotherapy: Secondary | ICD-10-CM

## 2021-03-05 DIAGNOSIS — Z7901 Long term (current) use of anticoagulants: Secondary | ICD-10-CM | POA: Diagnosis not present

## 2021-03-05 DIAGNOSIS — N179 Acute kidney failure, unspecified: Secondary | ICD-10-CM

## 2021-03-05 LAB — RENAL FUNCTION PANEL
Albumin: 2.9 g/dL — ABNORMAL LOW (ref 3.5–5.0)
Anion gap: 9 (ref 5–15)
BUN: 17 mg/dL (ref 8–23)
CO2: 18 mmol/L — ABNORMAL LOW (ref 22–32)
Calcium: 8.7 mg/dL — ABNORMAL LOW (ref 8.9–10.3)
Chloride: 112 mmol/L — ABNORMAL HIGH (ref 98–111)
Creatinine, Ser: 2.03 mg/dL — ABNORMAL HIGH (ref 0.61–1.24)
GFR, Estimated: 33 mL/min — ABNORMAL LOW (ref >60)
Glucose, Bld: 84 mg/dL (ref 70–99)
Phosphorus: 3 mg/dL (ref 2.5–4.6)
Potassium: 4 mmol/L (ref 3.5–5.1)
Sodium: 139 mmol/L (ref 135–145)

## 2021-03-05 LAB — MAGNESIUM: Magnesium: 1.9 mg/dL (ref 1.7–2.4)

## 2021-03-05 LAB — VITAMIN D 25 HYDROXY (VIT D DEFICIENCY, FRACTURES): Vit D, 25-Hydroxy: 112.64 ng/mL — ABNORMAL HIGH (ref 30–100)

## 2021-03-05 MED ORDER — HYDROXYZINE HCL 10 MG PO TABS
10.0000 mg | ORAL_TABLET | Freq: Three times a day (TID) | ORAL | Status: DC | PRN
  Administered 2021-03-05 – 2021-03-12 (×5): 10 mg via ORAL
  Filled 2021-03-05 (×8): qty 1

## 2021-03-05 MED ORDER — ACETAMINOPHEN 325 MG PO TABS: 650.0000 mg | ORAL_TABLET | ORAL | Status: DC | PRN

## 2021-03-05 MED ORDER — VITAMIN D (ERGOCALCIFEROL) 1.25 MG (50000 UNIT) PO CAPS
50000.0000 [IU] | ORAL_CAPSULE | ORAL | Status: DC
  Administered 2021-03-06 – 2021-03-13 (×2): 50000 [IU] via ORAL
  Filled 2021-03-05 (×2): qty 1

## 2021-03-05 MED ORDER — METOPROLOL SUCCINATE ER 50 MG PO TB24
125.0000 mg | ORAL_TABLET | Freq: Every day | ORAL | Status: DC
  Administered 2021-03-06 – 2021-03-14 (×9): 125 mg via ORAL
  Filled 2021-03-05: qty 2
  Filled 2021-03-05: qty 1
  Filled 2021-03-05 (×2): qty 2
  Filled 2021-03-05 (×2): qty 1
  Filled 2021-03-05: qty 2
  Filled 2021-03-05 (×2): qty 1

## 2021-03-05 MED ORDER — BUPROPION HCL ER (XL) 300 MG PO TB24
450.0000 mg | ORAL_TABLET | Freq: Every morning | ORAL | Status: DC
  Administered 2021-03-06 – 2021-03-11 (×6): 450 mg via ORAL
  Filled 2021-03-05 (×8): qty 1

## 2021-03-05 MED ORDER — ACETAMINOPHEN 160 MG/5ML PO SOLN: 650.0000 mg | ORAL | Status: DC | PRN

## 2021-03-05 MED ORDER — FELODIPINE ER 10 MG PO TB24: 10.0000 mg | ORAL_TABLET | Freq: Every day | ORAL | Status: DC

## 2021-03-05 MED ORDER — OFF THE BEAT BOOK
Freq: Once | Status: AC
Start: 2021-03-05 — End: 2021-03-05
  Filled 2021-03-05: qty 1

## 2021-03-05 MED ORDER — CYANOCOBALAMIN 1000 MCG PO TABS: 1000.0000 ug | ORAL_TABLET | Freq: Every day | ORAL | Status: AC

## 2021-03-05 MED ORDER — MELATONIN 3 MG PO TABS: 6.0000 mg | ORAL_TABLET | Freq: Every day | ORAL | 0 refills | Status: DC

## 2021-03-05 MED ORDER — METOPROLOL SUCCINATE ER 100 MG PO TB24: 100.0000 mg | ORAL_TABLET | Freq: Every day | ORAL | Status: DC

## 2021-03-05 MED ORDER — BLOOD PRESSURE CONTROL BOOK
Freq: Once | Status: AC
  Filled 2021-03-05: qty 1

## 2021-03-05 MED ORDER — PAROXETINE HCL 20 MG PO TABS
20.0000 mg | ORAL_TABLET | Freq: Every day | ORAL | Status: DC
  Administered 2021-03-05 – 2021-03-07 (×3): 20 mg via ORAL
  Filled 2021-03-05 (×3): qty 1

## 2021-03-05 MED ORDER — ACETAMINOPHEN 650 MG RE SUPP: 650.0000 mg | RECTAL | Status: DC | PRN

## 2021-03-05 MED ORDER — CYANOCOBALAMIN 1000 MCG/ML IJ SOLN
1000.0000 ug | Freq: Every day | INTRAMUSCULAR | Status: AC
  Administered 2021-03-06 – 2021-03-07 (×2): 1000 ug via INTRAMUSCULAR
  Filled 2021-03-05 (×2): qty 1

## 2021-03-05 MED ORDER — ATORVASTATIN CALCIUM 80 MG PO TABS: 80.0000 mg | ORAL_TABLET | Freq: Every day | ORAL | Status: DC

## 2021-03-05 MED ORDER — APIXABAN 5 MG PO TABS: 5.0000 mg | ORAL_TABLET | Freq: Two times a day (BID) | ORAL | Status: DC

## 2021-03-05 MED ORDER — APIXABAN 5 MG PO TABS
5.0000 mg | ORAL_TABLET | Freq: Two times a day (BID) | ORAL | Status: DC
  Administered 2021-03-05 – 2021-03-14 (×18): 5 mg via ORAL
  Filled 2021-03-05 (×19): qty 1

## 2021-03-05 MED ORDER — HYDROXYZINE HCL 10 MG PO TABS: 10.0000 mg | ORAL_TABLET | Freq: Three times a day (TID) | ORAL | 0 refills | Status: DC | PRN

## 2021-03-05 MED ORDER — VITAMIN D 25 MCG (1000 UNIT) PO TABS: 1000.0000 [IU] | ORAL_TABLET | Freq: Every day | ORAL | Status: DC

## 2021-03-05 MED ORDER — ATORVASTATIN CALCIUM 80 MG PO TABS
80.0000 mg | ORAL_TABLET | Freq: Every day | ORAL | Status: DC
  Administered 2021-03-06 – 2021-03-14 (×9): 80 mg via ORAL
  Filled 2021-03-05 (×8): qty 1
  Filled 2021-03-05: qty 2

## 2021-03-05 MED ORDER — FELODIPINE ER 5 MG PO TB24
10.0000 mg | ORAL_TABLET | Freq: Every day | ORAL | Status: DC
  Administered 2021-03-07 – 2021-03-14 (×8): 10 mg via ORAL
  Filled 2021-03-05: qty 2
  Filled 2021-03-05 (×2): qty 1
  Filled 2021-03-05 (×3): qty 2
  Filled 2021-03-05: qty 1
  Filled 2021-03-05 (×5): qty 2

## 2021-03-05 MED ORDER — VITAMIN B-12 1000 MCG PO TABS
1000.0000 ug | ORAL_TABLET | Freq: Every day | ORAL | Status: DC
  Administered 2021-03-08 – 2021-03-14 (×7): 1000 ug via ORAL
  Filled 2021-03-05 (×7): qty 1

## 2021-03-05 NOTE — Progress Notes (Addendum)
Inpatient Rehabilitation Admission Medication Review by a Pharmacist  A complete drug regimen review was completed for this patient to identify any potential clinically significant medication issues.  High Risk Drug Classes Is patient taking? Indication by Medication  Antipsychotic No   Anticoagulant Yes Apixaban - Atrial fibrillation  Antibiotic No   Opioid No   Antiplatelet No   Hypoglycemics/insulin No   Vasoactive Medication No   Chemotherapy No   Other Yes Bupropion, paroxetine, PRN hydroxyzine - anxiety/depression Metoprolol, felodipine - HTN Atorvastatin - HLD     Type of Medication Issue Identified Description of Issue Recommendation(s)  Drug Interaction(s) (clinically significant)     Duplicate Therapy     Allergy     No Medication Administration End Date     Incorrect Dose     Additional Drug Therapy Needed  Felodipine - noted to resume in discharge summary, but not ordered in CIR  Resumed per discussion with Dr. Dagoberto Ligas  Significant med changes from prior encounter (inform family/care partners about these prior to discharge).    Other       Clinically significant medication issues were identified that warrant physician communication and completion of prescribed/recommended actions by midnight of the next day:  Yes  Name of provider notified for urgent issues identified: Dr. Courtney Heys  Provider Method of Notification: Secure chat    Pharmacist comments:  Discharge summary noted to resume felodipine, but this has not been ordered in CIR. Per discussion with Dr. Dagoberto Ligas, will resume felodipine as noted in discharge summary.   Time spent performing this drug regimen review (minutes): [place] 03/05/2021 2:33 PM

## 2021-03-05 NOTE — Discharge Instructions (Addendum)
Inpatient Rehab Discharge Instructions  George [name]. Discharge date and time: 03/13/21   Activities/Precautions/ Functional Status: Activity: As tolerated Diet: Regular Wound Care: Routine skin checks   Functional status:  ___ No restrictions     ___ Walk up steps independently _X__ 24/7 supervision/assistance   ___ Walk up steps with assistance ___ Intermittent supervision/assistance  ___ Bathe/dress independently ___ Walk with walker     _x__ Bathe/dress with assistance ___ Walk Independently    ___ Shower independently _X__ Walk with assistance    ___ Shower with assistance __X_ No alcohol     ___ Return to work/school ________   Special Instructions: No driving, smoking or alcohol   COMMUNITY REFERRALS UPON DISCHARGE:    Home Health:   PT   OT   SP                  Agency: [facility] Phone: [phone]    Medical Equipment/Items Ordered: Luis Abed                                                 Agency/Supplier: [facility] [phone]    My questions have been answered and I understand these instructions. I will adhere to these goals and the provided educational materials after my discharge from the hospital.  Patient/Caregiver Signature _______________________________ Date __________  Clinician Signature _______________________________________ Date __________  Please bring this form and your medication list with you to all your follow-up doctor's appointments.  STROKE/TIA DISCHARGE INSTRUCTIONS SMOKING Cigarette smoking nearly doubles your risk of having a stroke & is the single most alterable risk factor  If you smoke or have smoked in the last 12 months, you are advised to quit smoking for your health. Most of the excess cardiovascular risk related to smoking disappears within a year of stopping. Ask you doctor about anti-smoking medications [place] Quit Line: 1-800-QUIT NOW Free Smoking Cessation Classes [phone]  CHOLESTEROL Know  your levels; limit fat & cholesterol in your diet  Lipid Panel     Component Value Date/Time   CHOL 119 02/28/2021 0027   TRIG 126 02/28/2021 0027   HDL 36 (L) 02/28/2021 0027   CHOLHDL 3.3 02/28/2021 0027   VLDL 25 02/28/2021 0027   LDLCALC 58 02/28/2021 0027     Many patients benefit from treatment even if their cholesterol is at goal. Goal: Total Cholesterol (CHOL) less than 160 Goal:  Triglycerides (TRIG) less than 150 Goal:  HDL greater than 40 Goal:  LDL (LDLCALC) less than 100   BLOOD PRESSURE American Stroke Association blood pressure target is less that 120/80 mm/Hg  Your discharge blood pressure is:  BP: (!) 161/90 Monitor your blood pressure Limit your salt and alcohol intake Many individuals will require more than one medication for high blood pressure  DIABETES (A1c is a blood sugar average for last 3 months) Goal HGBA1c is under 7% (HBGA1c is blood sugar average for last 3 months)  Diabetes: No known diagnosis of diabetes    Lab Results  Component Value Date   HGBA1C 5.4 02/28/2021    Your HGBA1c can be lowered with medications, healthy diet, and exercise. Check your blood sugar as directed by your physician Call your physician if you experience unexplained or low blood sugars.  PHYSICAL ACTIVITY/REHABILITATION Goal is 30 minutes at least 4 days per week  Activity: Increase activity slowly, Therapies: Physical Therapy: Home Health Return to work:  Activity decreases your risk of heart attack and stroke and makes your heart stronger.  It helps control your weight and blood pressure; helps you relax and can improve your mood. Participate in a regular exercise program. Talk with your doctor about the best form of exercise for you (dancing, walking, swimming, cycling).  DIET/WEIGHT Goal is to maintain a healthy weight  Your discharge diet is:  Diet Order             Diet Heart Room service appropriate? Yes; Fluid consistency: Thin  Diet effective now                    liquids Your height is:    Your current weight is:   Your Body Mass Index (BMI) is:    Following the type of diet specifically designed for you will help prevent another stroke. Your goal weight range is:   Your goal Body Mass Index (BMI) is 19-24. Healthy food habits can help reduce 3 risk factors for stroke:  High cholesterol, hypertension, and excess weight.  RESOURCES Stroke/Support Group:  Call [phone]   STROKE EDUCATION PROVIDED/REVIEWED AND GIVEN TO PATIENT Stroke warning signs and symptoms How to activate emergency medical system (call 911). Medications prescribed at discharge. Need for follow-up after discharge. Personal risk factors for stroke. Pneumonia vaccine given: No Flu vaccine given: No My questions have been answered, the writing is legible, and I understand these instructions.  I will adhere to these goals & educational materials that have been provided to me after my discharge from the hospital.    Information on my medicine - ELIQUIS (apixaban)  This medication education was reviewed with me or my healthcare representative as part of my discharge preparation.  The pharmacist that spoke with me during my hospital stay was:    Why was Eliquis prescribed for you? Eliquis was prescribed for you to reduce the risk of a blood clot forming that can cause a stroke if you have a medical condition called atrial fibrillation (a type of irregular heartbeat).  What do You need to know about Eliquis ? Take your Eliquis TWICE DAILY - one tablet in the morning and one tablet in the evening with or without food. If you have difficulty swallowing the tablet whole please discuss with your pharmacist how to take the medication safely.  Take Eliquis exactly as prescribed by your doctor and DO NOT stop taking Eliquis without talking to the doctor who prescribed the medication.  Stopping may increase your risk of developing a stroke.  Refill your prescription before  you run out.  After discharge, you should have regular check-up appointments with your healthcare provider that is prescribing your Eliquis.  In the future your dose may need to be changed if your kidney function or weight changes by a significant amount or as you get older.  What do you do if you miss a dose? If you miss a dose, take it as soon as you remember on the same day and resume taking twice daily.  Do not take more than one dose of ELIQUIS at the same time to make up a missed dose.  Important Safety Information A possible side effect of Eliquis is bleeding. You should call your healthcare provider right away if you experience any of the following: Bleeding from an injury or your nose that does not stop. Unusual colored urine (red or dark brown) or  unusual colored stools (red or black). Unusual bruising for unknown reasons. A serious fall or if you hit your head (even if there is no bleeding).  Some medicines may interact with Eliquis and might increase your risk of bleeding or clotting while on Eliquis. To help avoid this, consult your healthcare provider or pharmacist prior to using any new prescription or non-prescription medications, including herbals, vitamins, non-steroidal anti-inflammatory drugs (NSAIDs) and supplements.  This website has more information on Eliquis (apixaban): http://[email or web address].com/eliquis/home

## 2021-03-05 NOTE — Evaluation (Signed)
Occupational Therapy Assessment and Plan  Patient Details  Name: George [name]. MRN: [id number] Date of Birth: 10-02-1945  OT Diagnosis: abnormal posture, ataxia, cognitive deficits, muscle weakness (generalized), and impaired sensation Rehab Potential: Rehab Potential (ACUTE ONLY): Excellent ELOS: 7-10 days   Today's Date: 03/06/2021 OT Individual Time: 0900-1009 OT Individual Time Calculation (min): 69 min     Hospital Problem: Principal Problem:   Subcortical infarction [facility]   Past Medical History:  Past Medical History:  Diagnosis Date   Anxiety    Arthritis    OSTEO IN KNEE   Asthma    as child   Colon cancer ([place])    Depression    Elevated prostate specific antigen (PSA)    Generalized anxiety disorder 04/09/2015   Hematospermia    Hx antineoplastic chemotherapy 2010   Hypertension    Idiopathic peripheral neuropathy    TOES OF BOTH FEET DUE TO CHEMO   Incomplete bladder emptying    Malignant neoplasm of prostate ([place])    Nodular prostate with lower urinary tract symptoms    Panic attacks 04/09/2015   Primary localized osteoarthrosis of the knee, right 05/19/2016   Prostatitis    S/P total knee arthroplasty, left 05/19/2016   Spermatocele    Weak urinary stream    Past Surgical History:  Past Surgical History:  Procedure Laterality Date   COLON RESECTION   NOV 2009   COLONOSCOPY     PROSTATE BIOPSY     SPERMATOCELECTOMY Left 05/07/2014   Procedure: LEFT SPERMATOCELECTOMY;  Surgeon: Malka So, MD;  Location: [facility];  Service: Urology;  Laterality: Left;   TONSILLECTOMY     TOTAL KNEE ARTHROPLASTY Left 04/21/2015   Procedure: LEFT TOTAL KNEE ARTHROPLASTY;  Surgeon: Elsie Saas, MD;  Location: [place];  Service: Orthopedics;  Laterality: Left;   TOTAL KNEE ARTHROPLASTY Right 05/31/2016   Procedure: TOTAL KNEE ARTHROPLASTY;  Surgeon: Elsie Saas, MD;  Location: [place];  Service: Orthopedics;  Laterality: Right;   TRANSURETHRAL RESECTION OF PROSTATE N/A  05/07/2014   Procedure: TRANSURETHRAL RESECTION OF THE PROSTATE WITH GYRUS INSTRUMENTS;  Surgeon: Malka So, MD;  Location: [facility];  Service: Urology;  Laterality: N/A;    Assessment & Plan Clinical Impression: George, [name]. is a 75 year old right-handed male with history of colon cancer status postresection 2009 with chemotherapy, chemotherapy-induced peripheral neuropathy, prostate cancer with TURP 2016, anxiety/depression, hypertension, quit smoking 48 years ago, osteoarthritis bilateral knees status post TKA.  Per chart review lives with spouse.  1 level home with level entry.  Independent ADLs prior to admission and driving.  Presented 02/27/2021 med [facility] with acute onset of left-sided weakness as well as headache .  CT angiogram head and neck showed subacute right PCA territory infarct with probable small foci of petechial hemorrhage.  Possible focal occlusion of P2 branch at the level of the ambient cistern.  Small remote infarct right parietal lobe.  Patient did not receive tPA.  Admission chemistry unremarkable except creatinine 1.63-2.05, potassium 3.2, troponin 27, urinalysis negative nitrite.  Patient subsequently developed atrial fibrillation with RVR.  He was started on metoprolol as well as Cardizem drip.  Echocardiogram with ejection fraction of 60 to 65% no wall motion abnormalities.  Neurology follow-up maintained on Eliquis for CVA prophylaxis as well as atrial fibrillation.  AKI with creatinine 1.63 renal ultrasound negative for hydronephrosis initially maintained on IV fluids with latest creatinine 1.64.  He is tolerating a regular diet.  Therapy evaluations completed due to  patient's left-sided weakness and decreased functional mobility was admitted for a comprehensive rehab program.  Patient currently requires min with basic self-care skills secondary to muscle weakness, decreased cardiorespiratoy endurance, ataxia, decreased coordination, and decreased motor  planning, ?decreased visual perceptual skills and field cut, decreased attention to left, decreased initiation and decreased problem solving, and decreased sitting balance, decreased standing balance, and decreased postural control.  Prior to hospitalization, patient could complete BADLs with independent .  Patient will benefit from skilled intervention to increase independence with basic self-care skills prior to discharge home with care partner.  Anticipate patient will require 24 hour supervision and follow up home health.  OT - End of Session Endurance Deficit: Yes Endurance Deficit Description: Rest breaks needed in between stands today, pt also requesting to return to bed at close of session due to fatigue OT Assessment Rehab Potential (ACUTE ONLY): Excellent OT Barriers to Discharge: Other (comments) OT Barriers to Discharge Comments: n/a, pts home is accessible and he will have needed assistance from spouse OT Patient demonstrates impairments in the following area(s): Balance;Vision;Cognition;Endurance;Motor;Safety;Sensory;Perception;Skin Integrity OT Basic ADL's Functional Problem(s): Grooming;Bathing;Dressing;Toileting OT Advanced ADL's Functional Problem(s): Simple Meal Preparation OT Transfers Functional Problem(s): Toilet;Tub/Shower OT Additional Impairment(s): Fuctional Use of Upper Extremity OT Plan OT Intensity: Minimum of 1-2 x/day, 45 to 90 minutes OT Frequency: 5 out of 7 days OT Duration/Estimated Length of Stay: 7-10 days OT Treatment/Interventions: Balance/vestibular training;Disease mangement/prevention;Neuromuscular re-education;Self Care/advanced ADL retraining;Therapeutic Exercise;UE/LE Strength taining/ROM;Pain management;DME/adaptive equipment instruction;Cognitive remediation/compensation;Community reintegration;Patient/family education;UE/LE Coordination activities;Visual/perceptual remediation/compensation;Therapeutic Activities;Psychosocial support;Functional  mobility training;Discharge planning OT Self Feeding Anticipated Outcome(s): No goal OT Basic Self-Care Anticipated Outcome(s): Supervision OT Toileting Anticipated Outcome(s): Supervision OT Bathroom Transfers Anticipated Outcome(s): Supervision OT Recommendation Recommendations for Other Services: Other (comment) (n/a) Patient destination: Home Follow Up Recommendations: Home health OT Equipment Recommended: To be determined   OT Evaluation Precautions/Restrictions  Precautions Precautions: Fall Precaution Comments: L inattention Restrictions Weight Bearing Restrictions: No General   Vital Signs  Pain Pain Assessment Pain Scale: 0-10 Pain Score: 0-No pain Home Living/Prior Functioning Home Living Family/patient expects to be discharged to:: Private residence Living Arrangements: Spouse/significant other Available Help at Discharge: Family, Available 24 hours/day Type of Home: House Home Access: Level entry Home Layout: One level Bathroom Shower/Tub: Walk-in shower (they will need a shower seat) Bathroom Toilet: Standard  Lives With: Spouse Di Kindle) IADL History Homemaking Responsibilities: Yes (pt reported being completely independent with meal prep, light cleaning/household chores, running errands, and medication management PTA. 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, also built houses Leisure and Hobbies: Financial planner Prior Function Level of Independence: Independent with basic ADLs, Independent with homemaking with ambulation Driving: Yes Vision Baseline Vision/History: 1 Wears glasses (for distance when driving) Ability to See in Adequate Light: 1 Impaired (when reading his menu) Patient Visual Report: Blurring of vision;Eye fatigue/eye pain/headache Vision Assessment?: Yes;Vision impaired- to be further tested in functional context Tracking/Visual Pursuits: Decreased smoothness of horizontal  tracking;Decreased smoothness of vertical tracking;Requires cues, head turns, or add eye shifts to track Convergence: Impaired (comment) (unable) Additional Comments: ?Lt visual field cut Perception  Perception: Impaired Inattention/Neglect: Does not attend to left visual field Praxis Praxis: Impaired Praxis Impairment Details: Motor planning;Initiation Cognition Overall Cognitive Status: Impaired/Different from baseline Arousal/Alertness: Awake/alert Orientation Level: Person;Place;Situation Person: Oriented Place: Oriented Situation: Oriented Year: 2022 Month: November Day of Week: Correct Immediate Memory Recall: Sock;Blue;Bed Memory Recall Sock: Without Cue Memory Recall Blue: Without Cue Memory Recall Bed: Without Cue Problem Solving: Impaired (  needed assistance orienting his clothing) Behaviors: Impulsive (mild) Sensation Sensation Light Touch: Impaired Detail Light Touch Impaired Details: Impaired RUE;Impaired LUE;Impaired RLE;Impaired LLE (chemo induced PN at baseline, pt reports has not worsened s/p CVA) Coordination Gross Motor Movements are Fluid and Coordinated: No Fine Motor Movements are Fluid and Coordinated: No Coordination and Movement Description: Lt sided incoordination Finger Nose Finger Test: Ataxic Lt side, WNL Rt side Motor  Motor Motor: Ataxia  Trunk/Postural Assessment  Cervical Assessment Cervical Assessment: Exceptions to [facility] (forward head) Thoracic Assessment Thoracic Assessment: Exceptions to [facility] (rounded shoulders) Lumbar Assessment Lumbar Assessment: Exceptions to [facility] (posterior pelvic tilt) Postural Control Postural Control: Deficits on evaluation (decreased in standing/sitting, mild posterior lean/LOBs during dynamic sitting, pt able to self correct)  Balance Balance Balance Assessed: Yes Dynamic Sitting Balance Dynamic Sitting - Balance Support: During functional activity Dynamic Sitting - Level of Assistance: 5: Stand by assistance  (donning Ted hose) Dynamic Standing Balance Dynamic Standing - Balance Support: During functional activity;No upper extremity supported Dynamic Standing - Level of Assistance: 4: Min assist Dynamic Standing - Balance Activities: Forward lean/weight shifting;Lateral lean/weight shifting (perihygiene) Extremity/Trunk Assessment RUE Assessment RUE Assessment: Within Functional Limits Active Range of Motion (AROM) Comments: WNL General Strength Comments: 4+/5 grossly LUE Assessment LUE Assessment:  (mildly ataxic, pt dropping multiple ADL items when using Lt hand, needing cues to recognize) Active Range of Motion (AROM) Comments: WNL General Strength Comments: 4+/5 grossly  Care Tool Care Tool Self Care Eating    setup    Oral Care  Oral care, brush teeth, clean dentures activity did not occur: Refused (not assessed due to fatigue at end of tx)      Bathing   Body parts bathed by patient: Right arm;Left arm;Chest;Abdomen;Front perineal area;Buttocks;Right upper leg;Left upper leg;Right lower leg;Left lower leg;Face     Assist Level: Minimal Assistance - Patient > 75%    Upper Body Dressing(including orthotics)   What is the patient wearing?: Pull over shirt   Assist Level: Supervision/Verbal cueing    Lower Body Dressing (excluding footwear)   What is the patient wearing?: Pants;Underwear/pull up Assist for lower body dressing: Minimal Assistance - Patient > 75%    Putting on/Taking off footwear   What is the patient wearing?: Non-skid slipper socks;Ted hose Assist for footwear: Supervision/Verbal cueing       Care Tool Toileting Toileting activity Toileting Activity did not occur (Clothing management and hygiene only): N/A (no void or bm)          Toilet transfer Toilet transfer activity did not occur: N/A (not assessed due to time constraints)        Refer to Care Plan for [place] 1 OT Short Term Goal 1 (Week 1): STGs=LTGs due to  ELOS  Recommendations for other services: None    Skilled Therapeutic Intervention Skilled OT session completed with focus on initial evaluation, education on OT role/POC, and establishment of patient-centered goals.  Pt greeted in the bed with no c/o pain. Wife Di Kindle present. Pt agreeable to tx and engaged in BADLs at sit<stand level from EOB using RW for standing support. Pt with mild Lt UE incoordination during bimanual tasks and functional reaching, Lt inattention and Lt visual field deficits also present. CGA for sit<stands and Min balance assistance during dynamic standing. Pt with posterior bias/mild LOBs during dynamic sitting. He needed rest breaks in between stands due to fatigue, reported some dizziness in standing which absolved after a few minutes of  sitting afterwards. Pt returned to bed at close of session, all needs within reach and bed alarm set.   ADL ADL Eating: Not assessed Grooming: Minimal assistance Where Assessed-Grooming: Edge of bed Upper Body Bathing: Supervision/safety Where Assessed-Upper Body Bathing: Edge of bed Lower Body Bathing: Minimal assistance Where Assessed-Lower Body Bathing: Edge of bed Upper Body Dressing: Supervision/safety Where Assessed-Upper Body Dressing: Edge of bed Lower Body Dressing: Minimal assistance Where Assessed-Lower Body Dressing: Edge of bed Toileting: Not assessed Toilet Transfer: Not assessed Mobility   CGA sit<stands   Discharge Criteria: Patient will be discharged from OT if patient refuses treatment 3 consecutive times without medical reason, if treatment goals not met, if there is a change in medical status, if patient makes no progress towards goals or if patient is discharged from hospital.  The above assessment, treatment plan, treatment alternatives and goals were discussed and mutually agreed upon: by patient and by family  Skeet Simmer 03/06/2021, 10:53 AM

## 2021-03-05 NOTE — Discharge Summary (Signed)
Physician Discharge Summary  George [name]. CBS:[id number] DOB: [birth year] DOA: 02/27/2021  PCP: Charolette Forward, MD  Admit date: 02/27/2021 Discharge date: 03/05/2021 Admitted From: Home Disposition: [facility] Recommendations for Outpatient Follow-up:  Follow ups as below. Please obtain CBC/BMP/Mag in 1 to 2 days Please follow up on the following pending results: None  Discharge Condition: Stable CODE STATUS: Full code  Hospital Course: 75 year old M with PMH of colon cancer s/p resection, prostate cancer, anxiety, depression, bilateral knee OA and neuropathy presenting with left hemiparesis and left visual defect/hemianopsia, and found to have subacute right PCA CVA with P2 branch occlusion, and A. fib with RVR.  Patient was not able to have an MRI due to metallic material in left thigh.  TTE basically normal.  Neurology recommended anticoagulation with Eliquis.  Hospital course complicated by panic attacks/confusion.  Psychiatry consulted and recommended continuing Wellbutrin and outpatient follow-up.  Encephalopathy improved.   Therapy recommended CIR. Please check renal function in 1 to 2 days  See individual problem list below for more on hospital course.  Discharge Diagnoses:  Acute right PCA CVA with P2 branch occlusion and a small petechial hemorrhage.  Felt to be embolic in the setting of A. fib with RVR.  TTE within normal.  Not MRI candidate due to metabolic object in left eye.  LDL 58.  A1c 5.8.  UDS negative.  Still with left upper quadrantanopia, left pronator drift and left upper extremity dysmetria but improved. -Continue atorvastatin and Eliquis per neurology -Blood pressure goal-normotensive. -Continue therapy at CIR   New onset A. fib with RVR: RVR resolved.  TTE without shunt or blood clot.  TSH within normal. -Continue metoprolol and Eliquis -Optimize K and Mg  Essential hypertension: BP slightly elevated today.  Has been off losartan and  felodipine. -Resume home felodipine -Discontinued home losartan -Continue metoprolol.   Prolonged QTc -Optimize K and Mg -Minimize or avoid QT prolonging drugs   Hypokalemia/hypomagnesemia: Resolved.   AKI on CKD-3B: Cr up this morning.  Not on nephrotoxic meds.  Likely from poor p.o. intake Recent Labs    02/27/21 0937 02/27/21 1945 02/28/21 0027 03/01/21 0033 03/02/21 0733 03/03/21 0055 03/04/21 0108 03/05/21 0144  BUN 12  --  9 19 15 13 12 17   CREATININE 1.63* 1.60* 1.60* 2.05* 1.72* 1.62* 1.64* 2.03*  -Recheck renal function in 1 to 2 days -Consider small bolus fluid if no improvement.   History of colon and prostate cancer/chemo induced peripheral neuropathy   B12 deficiency: Vitamin B12 level 124. -Received injectable vitamin B12 1000 mcg x 5 -Discharged on p.o. vitamin B12.  Elevated vitamin D level: 112.64.  Likely from vitamin D supplementation -Discontinued p.o. vitamin D.   Anxiety and depression:  Patient's wife concerned about his anxiety.  Patient is on significant dose of Wellbutrin which is not a great choice in the setting of anxiety.  Psychiatry consulted and added melatonin and recommended outpatient follow-up.   Body mass index is 27.08 kg/m.           Discharge Exam: Vitals:   03/05/21 0350 03/05/21 0438 03/05/21 0732 03/05/21 0949  BP: (!) 153/84  118/66 (!) 152/91  Pulse:   78 69  Temp: 98.3 F (36.8 C)  98 F (36.7 C)   Resp: 14 15 16    Height:      Weight:      SpO2: 93%     TempSrc: Oral  Oral   BMI (Calculated):  GENERAL: No apparent distress.  Nontoxic. HEENT: MMM.  Vision and hearing grossly intact.  NECK: Supple.  No apparent JVD.  RESP: On room air.  No IWOB.  Fair aeration bilaterally. CVS: Irregular rhythm.  Normal rate.Marland Kitchen Heart sounds normal.  ABD/GI/GU: Bowel sounds present. Soft. Non tender.  MSK/EXT:  Moves extremities. No apparent deformity. No edema.  SKIN: no apparent skin lesion or wound NEURO: Awake  and alert.  Oriented appropriately.  PERRL.  Left upper quadrantanopia improved.  Still with left pronator drift and left upper extremity dysmetria but improved.  Motor intact in all extremities.  Light sensation intact. PSYCH: Calm. Normal affect.   Discharge Instructions  Discharge Instructions     Diet Heart   Complete by: As directed    Increase activity slowly   Complete by: As directed       Allergies as of 03/05/2021       Reactions   Amlodipine Other (See Comments)   Dizziness    Gabapentin    hallucinations        Medication List     STOP taking these medications    losartan 100 MG tablet Commonly known as: COZAAR   oxymetazoline 0.05 % nasal spray Commonly known as: AFRIN       TAKE these medications    acetaminophen 500 MG tablet Commonly known as: TYLENOL Take 500 mg by mouth every 6 (six) hours as needed for mild pain or headache.   apixaban 5 MG Tabs tablet Commonly known as: ELIQUIS Take 1 tablet (5 mg total) by mouth 2 (two) times daily.   atorvastatin 80 MG tablet Commonly known as: LIPITOR Take 1 tablet (80 mg total) by mouth daily. Start taking on: March 06, 2021 What changed:  medication strength how much to take   buPROPion 150 MG 24 hr tablet Commonly known as: WELLBUTRIN XL Take 450 mg by mouth every morning.   cholecalciferol 25 MCG (1000 UNIT) tablet Commonly known as: VITAMIN D3 Take 1 tablet (1,000 Units total) by mouth daily. What changed:  medication strength how much to take   cyanocobalamin 1000 MCG tablet Take 1 tablet (1,000 mcg total) by mouth daily. Start taking on: March 06, 2021   felodipine 10 MG 24 hr tablet Commonly known as: PLENDIL Take 1 tablet (10 mg total) by mouth daily. What changed: how much to take   hydrOXYzine 10 MG tablet Commonly known as: ATARAX/VISTARIL Take 1 tablet (10 mg total) by mouth 3 (three) times daily as needed for anxiety, itching or nausea.   melatonin 3 MG Tabs  tablet Take 2 tablets (6 mg total) by mouth at bedtime.   metoprolol succinate 100 MG 24 hr tablet Commonly known as: TOPROL-XL Take 1 tablet (100 mg total) by mouth daily. Take with or immediately following a meal. Start taking on: March 06, 2021 What changed:  how much to take Another medication with the same name was removed. Continue taking this medication, and follow the directions you see here.        Consultations: Neurology   Procedures/Studies:   CT ANGIO HEAD NECK W WO CM  Result Date: 02/27/2021 CLINICAL DATA:  Weakness on left side, headache EXAM: CT ANGIOGRAPHY HEAD AND NECK TECHNIQUE: Multidetector CT imaging of the head and neck was performed using the standard protocol during bolus administration of intravenous contrast. Multiplanar CT image reconstructions and MIPs were obtained to evaluate the vascular anatomy. Carotid stenosis measurements (when applicable) are obtained utilizing NASCET criteria, using the distal  internal carotid diameter as the denominator. CONTRAST:  57mL OMNIPAQUE IOHEXOL 350 MG/ML SOLN COMPARISON:  CT head 02/17/2009 FINDINGS: CT HEAD FINDINGS Brain: There is confluent hypodensity throughout the right PCA distribution including the thalamus consistent with subacute infarct. There are small foci of hyperdensity within the infarct territory suspicious for petechial hemorrhage. There is no other evidence of infarct or acute hemorrhage. There is no acute extra-axial fluid collection. There is mild parenchymal volume loss. The ventricles are not enlarged. There is a remote infarct a remote infarct in the right superior parietal lobule. There is no solid mass lesion.  There is no midline shift. Vascular: See below. Skull: Normal. Negative for fracture or focal lesion. Sinuses: The imaged paranasal sinuses are clear. Orbits: Metallic density anterior to the right globe is unchanged since 2010. The globes and orbits are otherwise unremarkable. Review of the  MIP images confirms the above findings CTA NECK FINDINGS Aortic arch: Standard branching. Imaged portion shows no evidence of aneurysm or dissection. No significant stenosis of the major arch vessel origins. Right carotid system: The right common, internal, and external carotid arteries are patent, without hemodynamically significant stenosis, occlusion, dissection, or aneurysm. Left carotid system: The left common, internal, and external carotid arteries are patent, without hemodynamically significant stenosis, occlusion, dissection, or aneurysm. Vertebral arteries: The vertebral arteries are patent, without hemodynamically significant stenosis, occlusion, dissection, or aneurysm. Skeleton: There is multilevel degenerative change of the cervical spine, most advanced at C7-T1. There is no acute osseous abnormality or aggressive osseous lesion. Other neck: The soft tissues of the neck are unremarkable. Upper chest: The imaged lung apices are clear. Review of the MIP images confirms the above findings CTA HEAD FINDINGS Anterior circulation: The intracranial internal carotid arteries are patent. The bilateral ACAs and MCAs are patent. There is no aneurysm. Posterior circulation: The bilateral V4 segments are patent. The basilar artery is patent. The right P1 and proximal P2 segments are patent. There is possible focal occlusion of a P2 branch at the level of the ambient cistern, though evaluation is made difficult by adjacent enhancing venous structures. The PCA is identified within the occipital lobe hypodensity (15-92). The left PCA is patent. The posterior communicating arteries are not identified. Venous sinuses: Patent. Anatomic variants: As above. Review of the MIP images confirms the above findings IMPRESSION: 1. Subacute right PCA territorial infarct with probable small foci of petechial hemorrhage. 2. Possible focal occlusion of a P2 branch at the level of the ambient cistern, though the PCA is identified  within the hypodense right occipital lobe. 3. Otherwise, patent vasculature of the head and neck with no significant atherosclerotic disease. 4. Small remote infarct in the right parietal lobe. These results were called by telephone at the time of interpretation on 02/27/2021 at 12:00 pm to provider Wynona Dove , who verbally acknowledged these results. Electronically Signed   By: Valetta Mole M.D.   On: 02/27/2021 12:05   US RENAL  Result Date: 03/01/2021 CLINICAL DATA:  AK I EXAM: RENAL / URINARY TRACT ULTRASOUND COMPLETE COMPARISON:  Renal ultrasound 05/27/2020 FINDINGS: Right Kidney: Renal measurements: 10.0 x 5.0 x 4.2 cm = volume: 108 mL. Echogenicity within normal limits. No mass or hydronephrosis visualized. Left Kidney: Renal measurements: 9.2 x 4.9 x 5.2 cm = volume: 123 mL. Echogenicity within normal limits. No mass or hydronephrosis visualized. Bladder: Appears normal for degree of bladder distention. Other: None. IMPRESSION: Unremarkable sonographic appearance of the bilateral kidneys. Electronically Signed   By: Jac Canavan.D.  On: 03/01/2021 11:28   DG Chest Port 1 View  Result Date: 02/27/2021 CLINICAL DATA:  Stroke symptoms EXAM: PORTABLE CHEST 1 VIEW COMPARISON:  Chest radiograph 04/16/2019 FINDINGS: The heart is at the upper limits of normal for size, exaggerated by AP technique. There is unchanged slight asymmetric elevation of the right hemidiaphragm. There is no focal consolidation or pulmonary edema. There is no pleural effusion or pneumothorax. There is no acute osseous abnormality. IMPRESSION: No radiographic evidence of acute cardiopulmonary process. Electronically Signed   By: Valetta Mole M.D.   On: 02/27/2021 10:32   ECHOCARDIOGRAM COMPLETE  Result Date: 02/28/2021    ECHOCARDIOGRAM REPORT   Patient Name:   George [name]. Date of Exam: 02/28/2021 Medical Rec #:  [id number]         Height:       73.0 in Accession #:    [id number]        Weight:       196.6 lb  Date of Birth:  [birth year]         BSA:          2.136 m Patient Age:    8 years          BP:           143/93 mmHg Patient Gender: M                 HR:           89 bpm. Exam Location:  Inpatient Procedure: 2D Echo Indications:    stroke  History:        Patient has no prior history of Echocardiogram examinations.                 Arrythmias:Atrial Fibrillation.  Sonographer:    Johny Chess RDCS Referring Phys: [id number] [place]  1. Pt in atrial fibrillation at time of study.  2. Left ventricular ejection fraction, by estimation, is 60 to 65%. The left ventricle has normal function. The left ventricle has no regional wall motion abnormalities. Left ventricular diastolic function could not be evaluated.  3. Right ventricular systolic function is normal. The right ventricular size is normal.  4. The mitral valve is normal in structure. Trivial mitral valve regurgitation. No evidence of mitral stenosis.  5. The aortic valve is abnormal. Aortic valve regurgitation is not visualized. Aortic valve sclerosis is present, with no evidence of aortic valve stenosis. Comparison(s): No prior Echocardiogram. FINDINGS  Left Ventricle: Left ventricular ejection fraction, by estimation, is 60 to 65%. The left ventricle has normal function. The left ventricle has no regional wall motion abnormalities. The left ventricular internal cavity size was normal in size. There is  no left ventricular hypertrophy. Left ventricular diastolic function could not be evaluated due to atrial fibrillation. Left ventricular diastolic function could not be evaluated. Right Ventricle: The right ventricular size is normal. Right ventricular systolic function is normal. Left Atrium: Left atrial size was normal in size. Right Atrium: Right atrial size was normal in size. Pericardium: There is no evidence of pericardial effusion. Mitral Valve: The mitral valve is normal in structure. Trivial mitral valve regurgitation. No evidence  of mitral valve stenosis. Tricuspid Valve: The tricuspid valve is normal in structure. Tricuspid valve regurgitation is mild . No evidence of tricuspid stenosis. Aortic Valve: The aortic valve is abnormal. Aortic valve regurgitation is not visualized. Aortic valve sclerosis is present, with no evidence of aortic valve stenosis. Pulmonic Valve: The pulmonic valve was normal  in structure. Pulmonic valve regurgitation is trivial. No evidence of pulmonic stenosis. Aorta: The aortic root is normal in size and structure. Venous: The inferior vena cava was not well visualized. IAS/Shunts: The interatrial septum was not well visualized. Additional Comments: Pt in atrial fibrillation at time of study.  LEFT VENTRICLE PLAX 2D LVIDd:         4.80 cm LVIDs:         3.00 cm LV PW:         0.90 cm LV IVS:        0.80 cm LVOT diam:     1.90 cm LVOT Area:     2.84 cm  RIGHT VENTRICLE RV S prime:     10.80 cm/s TAPSE (M-mode): 1.8 cm LEFT ATRIUM             Index        RIGHT ATRIUM           Index LA diam:        4.00 cm 1.87 cm/m   RA Area:     13.50 cm LA Vol (A2C):   55.3 ml 25.89 ml/m  RA Volume:   31.40 ml  14.70 ml/m LA Vol (A4C):   53.8 ml 25.18 ml/m LA Biplane Vol: 54.6 ml 25.56 ml/m   AORTA Ao Root diam: 2.90 cm Ao Asc diam:  3.40 cm TRICUSPID VALVE TR Peak grad:   18.1 mmHg TR Vmax:        213.00 cm/s  SHUNTS Systemic Diam: 1.90 cm Kirk Ruths MD Electronically signed by Kirk Ruths MD Signature Date/Time: 02/28/2021/4:19:34 PM    Final        The results of significant diagnostics from this hospitalization (including imaging, microbiology, ancillary and laboratory) are listed below for reference.     Microbiology: Recent Results (from the past 240 hour(s))  Resp Panel by RT-PCR (Flu A&B, Covid) Nasopharyngeal Swab     Status: None   Collection Time: 02/27/21  9:47 AM   Specimen: Nasopharyngeal Swab; Nasopharyngeal(NP) swabs in vial transport medium  Result Value Ref Range Status   SARS  Coronavirus 2 by RT PCR NEGATIVE NEGATIVE Final    Comment: (NOTE) SARS-CoV-2 target nucleic acids are NOT DETECTED.  The SARS-CoV-2 RNA is generally detectable in upper respiratory specimens during the acute phase of infection. The lowest concentration of SARS-CoV-2 viral copies this assay can detect is 138 copies/mL. A negative result does not preclude SARS-Cov-2 infection and should not be used as the sole basis for treatment or other patient management decisions. A negative result may occur with  improper specimen collection/handling, submission of specimen other than nasopharyngeal swab, presence of viral mutation(s) within the areas targeted by this assay, and inadequate number of viral copies(<138 copies/mL). A negative result must be combined with clinical observations, patient history, and epidemiological information. The expected result is Negative.  Fact Sheet for Patients:  EntrepreneurPulse.com.au  Fact Sheet for Healthcare Providers:  IncredibleEmployment.be  This test is no t yet approved or cleared by the [place] FDA and  has been authorized for detection and/or diagnosis of SARS-CoV-2 by FDA under an Emergency Use Authorization (EUA). This EUA will remain  in effect (meaning this test can be used) for the duration of the COVID-19 declaration under Section 564(b)(1) of the Act, 21 U.S.C.section 360bbb-3(b)(1), unless the authorization is terminated  or revoked sooner.       Influenza A by PCR NEGATIVE NEGATIVE Final   Influenza B by PCR NEGATIVE  NEGATIVE Final    Comment: (NOTE) The Xpert Xpress SARS-CoV-2/FLU/RSV plus assay is intended as an aid in the diagnosis of influenza from Nasopharyngeal swab specimens and should not be used as a sole basis for treatment. Nasal washings and aspirates are unacceptable for Xpert Xpress SARS-CoV-2/FLU/RSV testing.  Fact Sheet for  Patients: EntrepreneurPulse.com.au  Fact Sheet for Healthcare Providers: IncredibleEmployment.be  This test is not yet approved or cleared by the [place] FDA and has been authorized for detection and/or diagnosis of SARS-CoV-2 by FDA under an Emergency Use Authorization (EUA). This EUA will remain in effect (meaning this test can be used) for the duration of the COVID-19 declaration under Section 564(b)(1) of the Act, 21 U.S.C. section 360bbb-3(b)(1), unless the authorization is terminated or revoked.  Performed at [facility], [address], [place], [place] [address]   Urine Culture     Status: None   Collection Time: 03/01/21 10:09 AM   Specimen: Urine, Clean Catch  Result Value Ref Range Status   Specimen Description URINE, CLEAN CATCH  Final   Special Requests NONE  Final   Culture   Final    NO GROWTH Performed at [facility], [place] [address] [place], [place] [address]    Report Status 03/02/2021 FINAL  Final     Labs:  CBC: Recent Labs  Lab 02/27/21 0937 02/27/21 1945 03/01/21 0033 03/02/21 0733 03/04/21 0108  WBC 10.4 10.2 10.3 10.4 7.9  NEUTROABS 7.6  --   --   --   --   HGB 13.4 13.6 10.9* 11.6* 10.3*  HCT 41.6 43.8 33.7* 36.0* 31.3*  MCV 89.8 89.9 89.9 91.6 89.2  PLT 294 306 269 249 282   BMP &GFR Recent Labs  Lab 02/27/21 0937 02/27/21 1945 02/28/21 0027 03/01/21 0033 03/02/21 0733 03/03/21 0055 03/04/21 0108 03/05/21 0144  NA 142  --  137 136 140 136 138 139  K 3.2*  --  3.2* 3.2* 3.6 3.3* 3.3* 4.0  CL 107  --  107 107 109 110 109 112*  CO2 22  --  23 22 21* 19* 21* 18*  GLUCOSE 97  --  119* 118* 88 88 87 84  BUN 12  --  9 19 15 13 12 17   CREATININE 1.63*   < > 1.60* 2.05* 1.72* 1.62* 1.64* 2.03*  CALCIUM 9.9  --  8.8* 8.3* 8.7* 8.4* 8.5* 8.7*  MG 2.1  --  2.3  --   --   --  1.9 1.9  PHOS  --   --   --   --   --   --   --  3.0   < > = values in this interval not  displayed.   Estimated Creatinine Clearance: 34.4 mL/min (A) (by C-G formula based on SCr of 2.03 mg/dL (H)). Liver & Pancreas: Recent Labs  Lab 02/27/21 0937 03/05/21 0144  AST 24  --   ALT 13  --   ALKPHOS 76  --   BILITOT 0.6  --   PROT 8.4*  --   ALBUMIN 4.7 2.9*   No results for input(s): LIPASE, AMYLASE in the last 168 hours. No results for input(s): AMMONIA in the last 168 hours. Diabetic: No results for input(s): HGBA1C in the last 72 hours. Recent Labs  Lab 02/27/21 0942 03/01/21 1241 03/01/21 1651  GLUCAP 102* 87 111*   Cardiac Enzymes: No results for input(s): CKTOTAL, CKMB, CKMBINDEX, TROPONINI in the last 168 hours. No results for  input(s): PROBNP in the last 8760 hours. Coagulation Profile: Recent Labs  Lab 02/27/21 0937  INR 1.0   Thyroid Function Tests: Recent Labs    03/04/21 1106  TSH 1.955   Lipid Profile: No results for input(s): CHOL, HDL, LDLCALC, TRIG, CHOLHDL, LDLDIRECT in the last 72 hours. Anemia Panel: No results for input(s): VITAMINB12, FOLATE, FERRITIN, TIBC, IRON, RETICCTPCT in the last 72 hours. Urine analysis:    Component Value Date/Time   COLORURINE AMBER (A) 03/01/2021 1002   APPEARANCEUR HAZY (A) 03/01/2021 1002   LABSPEC 1.030 03/01/2021 1002   PHURINE 5.0 03/01/2021 1002   GLUCOSEU NEGATIVE 03/01/2021 1002   HGBUR NEGATIVE 03/01/2021 1002   [place] 03/01/2021 1002   [place] 03/01/2021 1002   PROTEINUR 30 (A) 03/01/2021 1002   UROBILINOGEN 0.2 02/16/2009 1615   NITRITE NEGATIVE 03/01/2021 1002   LEUKOCYTESUR NEGATIVE 03/01/2021 1002   Sepsis Labs: Invalid input(s): PROCALCITONIN, LACTICIDVEN   Time coordinating discharge: 55 minutes  SIGNED:  Mercy Riding, MD  [facility] 03/05/2021, 10:37 AM

## 2021-03-06 DIAGNOSIS — I639 Cerebral infarction, unspecified: Secondary | ICD-10-CM | POA: Diagnosis not present

## 2021-03-06 LAB — CBC WITH DIFFERENTIAL/PLATELET
Abs Immature Granulocytes: 0.05 10*3/uL (ref 0.00–0.07)
Basophils Absolute: 0.1 10*3/uL (ref 0.0–0.1)
Basophils Relative: 1 %
Eosinophils Absolute: 0.7 10*3/uL — ABNORMAL HIGH (ref 0.0–0.5)
Eosinophils Relative: 7 %
HCT: 30.6 % — ABNORMAL LOW (ref 39.0–52.0)
Hemoglobin: 9.8 g/dL — ABNORMAL LOW (ref 13.0–17.0)
Immature Granulocytes: 1 %
Lymphocytes Relative: 18 %
Lymphs Abs: 1.6 10*3/uL (ref 0.7–4.0)
MCH: 28.9 pg (ref 26.0–34.0)
MCHC: 32 g/dL (ref 30.0–36.0)
MCV: 90.3 fL (ref 80.0–100.0)
Monocytes Absolute: 0.7 10*3/uL (ref 0.1–1.0)
Monocytes Relative: 7 %
Neutro Abs: 6.2 10*3/uL (ref 1.7–7.7)
Neutrophils Relative %: 66 %
Platelets: 304 10*3/uL (ref 150–400)
RBC: 3.39 MIL/uL — ABNORMAL LOW (ref 4.22–5.81)
RDW: 13.6 % (ref 11.5–15.5)
WBC: 9.3 10*3/uL (ref 4.0–10.5)
nRBC: 0 % (ref 0.0–0.2)

## 2021-03-06 LAB — COMPREHENSIVE METABOLIC PANEL
ALT: 12 U/L (ref 0–44)
AST: 19 U/L (ref 15–41)
Albumin: 3 g/dL — ABNORMAL LOW (ref 3.5–5.0)
Alkaline Phosphatase: 58 U/L (ref 38–126)
Anion gap: 6 (ref 5–15)
BUN: 18 mg/dL (ref 8–23)
CO2: 21 mmol/L — ABNORMAL LOW (ref 22–32)
Calcium: 8.6 mg/dL — ABNORMAL LOW (ref 8.9–10.3)
Chloride: 109 mmol/L (ref 98–111)
Creatinine, Ser: 1.91 mg/dL — ABNORMAL HIGH (ref 0.61–1.24)
GFR, Estimated: 36 mL/min — ABNORMAL LOW (ref >60)
Glucose, Bld: 93 mg/dL (ref 70–99)
Potassium: 3.5 mmol/L (ref 3.5–5.1)
Sodium: 136 mmol/L (ref 135–145)
Total Bilirubin: 0.8 mg/dL (ref 0.3–1.2)
Total Protein: 6.3 g/dL — ABNORMAL LOW (ref 6.5–8.1)

## 2021-03-06 MED ORDER — PROCHLORPERAZINE EDISYLATE 10 MG/2ML IJ SOLN: 10.0000 mg | Freq: Four times a day (QID) | INTRAMUSCULAR | Status: DC | PRN

## 2021-03-06 MED ORDER — FELODIPINE ER 10 MG PO TB24: 10.0000 mg | ORAL_TABLET | Freq: Every day | ORAL | Status: DC

## 2021-03-06 MED ORDER — PROCHLORPERAZINE MALEATE 5 MG PO TABS: 10.0000 mg | ORAL_TABLET | Freq: Four times a day (QID) | ORAL | Status: DC | PRN

## 2021-03-06 MED ORDER — BUSPIRONE HCL 5 MG PO TABS
5.0000 mg | ORAL_TABLET | Freq: Three times a day (TID) | ORAL | Status: DC | PRN
  Administered 2021-03-11 – 2021-03-12 (×3): 5 mg via ORAL
  Filled 2021-03-06 (×3): qty 1

## 2021-03-06 NOTE — Progress Notes (Signed)
Inpatient Rehabilitation Care Coordinator Assessment and Plan Patient Details  Name: George [name]. MRN: [id number] Date of Birth: [birth year]  Today's Date: 03/06/2021  Hospital Problems: Principal Problem:   Subcortical infarction [facility])  Past Medical History:  Past Medical History:  Diagnosis Date   Anxiety    Arthritis    OSTEO IN KNEE   Asthma    as child   Colon cancer ([place])    Depression    Elevated prostate specific antigen (PSA)    Generalized anxiety disorder 04/09/2015   Hematospermia    Hx antineoplastic chemotherapy 2010   Hypertension    Idiopathic peripheral neuropathy    TOES OF BOTH FEET DUE TO CHEMO   Incomplete bladder emptying    Malignant neoplasm of prostate ([place])    Nodular prostate with lower urinary tract symptoms    Panic attacks 04/09/2015   Primary localized osteoarthrosis of the knee, right 05/19/2016   Prostatitis    S/P total knee arthroplasty, left 05/19/2016   Spermatocele    Weak urinary stream    Past Surgical History:  Past Surgical History:  Procedure Laterality Date   COLON RESECTION   NOV 2009   COLONOSCOPY     PROSTATE BIOPSY     SPERMATOCELECTOMY Left 05/07/2014   Procedure: LEFT SPERMATOCELECTOMY;  Surgeon: Malka So, MD;  Location: [facility];  Service: Urology;  Laterality: Left;   TONSILLECTOMY     TOTAL KNEE ARTHROPLASTY Left 04/21/2015   Procedure: LEFT TOTAL KNEE ARTHROPLASTY;  Surgeon: Elsie Saas, MD;  Location: [place];  Service: Orthopedics;  Laterality: Left;   TOTAL KNEE ARTHROPLASTY Right 05/31/2016   Procedure: TOTAL KNEE ARTHROPLASTY;  Surgeon: Elsie Saas, MD;  Location: [place];  Service: Orthopedics;  Laterality: Right;   TRANSURETHRAL RESECTION OF PROSTATE N/A 05/07/2014   Procedure: TRANSURETHRAL RESECTION OF THE PROSTATE WITH GYRUS INSTRUMENTS;  Surgeon: Malka So, MD;  Location: [facility];  Service: Urology;  Laterality: N/A;   Social History:  reports that he quit smoking about 48 years ago. His smoking use  included cigarettes. He has a 0.75 pack-year smoking history. He has never used smokeless tobacco. He reports current alcohol use of about 1.0 standard drink per week. He reports that he does not use drugs.  Family / Support Systems Marital Status: Married Patient Roles: Spouse, Parent Spouse/Significant Other: [name] Children: Queenie-daughter [phone] Other Supports: Yvonne-niece and other family members Anticipated Caregiver: Wife and daughter Ability/Limitations of Caregiver: no issues Caregiver Availability: 24/7 Family Dynamics: Close knit family who will provide the assist pt needs, since he would for them and do what is needed. 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 issues Guardian/Conservator: None-according to MD pt is capable of making his own decisions while here, wife plans to be here daily to provide support to husband   Abuse/Neglect Abuse/Neglect Assessment Can Be Completed: Yes Physical Abuse: Denies Verbal Abuse: Denies Sexual Abuse: Denies Exploitation of patient/patient's resources: Denies Self-Neglect: Denies  Patient response to: Social Isolation - How often do you feel lonely or isolated from those around you?: Sometimes  Emotional Status Pt's affect, behavior and adjustment status: Pt is motivated but needs his anxiety meds  re-started, he is feeling anxious and does not wnat to have a panic attack due to this. He has always taken care of himself and wants to get back to this, he has health issues but has adapted and still remained independent Recent Psychosocial  Issues: other health issues Psychiatric History: History of depression/anxiety and panic attacks-feels his medications have regulated this and needs the MD to re-start them in which she as today. May benefit from seeing neuro-psych while here Substance Abuse History: No issues-remote smoker  Patient / Family Perceptions, Expectations & Goals Pt/Family understanding of illness & functional limitations: Pt and wife can explain his stroke and deficits as a result. They both talk with the MD and feel their questions and concerns are being addressed. Will see how he progresses while here Premorbid pt/family roles/activities: Husband, father, retiree, grandchildren, church member, neighbor Anticipated changes in roles/activities/participation: resume Pt/family expectations/goals: Pt states: " I hope to do well and be able to do for myself when I leave here."  Wife states: " I hope he progresses here he is not one to ask for help from others even me."  [employer]: None Premorbid Home Care/DME Agencies: None Transportation available at discharge: Pt drove PTA and wife does drive Is the patient able to respond to transportation needs?: Yes In the past 12 months, has lack of transportation kept you from medical appointments or from getting medications?: No In the past 12 months, has lack of transportation kept you from meetings, work, or from getting things needed for daily living?: No Resource referrals recommended: Neuropsychology  Discharge Planning Living Arrangements: Spouse/significant other Support Systems: Spouse/significant other, Children, Other relatives, Church/faith community Type of Residence: Private residence Insurance Resources: [employer], Multimedia programmer (specify) Conservation officer, nature) Financial Resources: Fish farm manager, Family Support Financial Screen Referred: No Living Expenses: Own Money Management: Patient, Spouse Does the patient  have any problems obtaining your medications?: No Home Management: Wife Patient/Family Preliminary Plans: Return home with wife who can assist if needed, their daughter will also assist if needed-when she can. will await therapists to evaluate and work on discharge needs. Wife is present today for therapies Care Coordinator Anticipated Follow Up Needs: HH/OP  Clinical Impression Pleasant gentleman who is motivated to dl well and recover from this stroke. His wife is here and will be throughout his stay to provide emotional support. Do feel pt may benefit from neuro-psych while here. Will await therapy evaluations.  [name] 03/06/2021, 11:40 AM

## 2021-03-06 NOTE — Evaluation (Signed)
Physical Therapy Assessment and Plan  Patient Details  Name: George [name]. MRN: [id number] Date of Birth: 09-24-1945  PT Diagnosis: Abnormal posture, Abnormality of gait, Ataxia, Ataxic gait, Cognitive deficits, Difficulty walking, Hemiparesis non-dominant, Impaired sensation, and Muscle weakness Rehab Potential: Good ELOS: 10-14 days   Today's Date: 03/06/2021 PT Individual Time: 1045-1200 PT Individual Time Calculation (min): 75 min    Hospital Problem: Principal Problem:   Subcortical infarction [facility])   Past Medical History:  Past Medical History:  Diagnosis Date   Anxiety    Arthritis    OSTEO IN KNEE   Asthma    as child   Colon cancer ([place])    Depression    Elevated prostate specific antigen (PSA)    Generalized anxiety disorder 04/09/2015   Hematospermia    Hx antineoplastic chemotherapy 2010   Hypertension    Idiopathic peripheral neuropathy    TOES OF BOTH FEET DUE TO CHEMO   Incomplete bladder emptying    Malignant neoplasm of prostate ([place])    Nodular prostate with lower urinary tract symptoms    Panic attacks 04/09/2015   Primary localized osteoarthrosis of the knee, right 05/19/2016   Prostatitis    S/P total knee arthroplasty, left 05/19/2016   Spermatocele    Weak urinary stream    Past Surgical History:  Past Surgical History:  Procedure Laterality Date   COLON RESECTION   NOV 2009   COLONOSCOPY     PROSTATE BIOPSY     SPERMATOCELECTOMY Left 05/07/2014   Procedure: LEFT SPERMATOCELECTOMY;  Surgeon: Malka So, MD;  Location: [facility];  Service: Urology;  Laterality: Left;   TONSILLECTOMY     TOTAL KNEE ARTHROPLASTY Left 04/21/2015   Procedure: LEFT TOTAL KNEE ARTHROPLASTY;  Surgeon: Elsie Saas, MD;  Location: [place];  Service: Orthopedics;  Laterality: Left;   TOTAL KNEE ARTHROPLASTY Right 05/31/2016   Procedure: TOTAL KNEE ARTHROPLASTY;  Surgeon: Elsie Saas, MD;  Location: [place];  Service: Orthopedics;  Laterality: Right;   TRANSURETHRAL  RESECTION OF PROSTATE N/A 05/07/2014   Procedure: TRANSURETHRAL RESECTION OF THE PROSTATE WITH GYRUS INSTRUMENTS;  Surgeon: Malka So, MD;  Location: [facility];  Service: Urology;  Laterality: N/A;    Assessment & Plan Clinical Impression: Patient is a 75 year old right-handed male with history of colon cancer status postresection 2009 with chemotherapy, chemotherapy-induced peripheral neuropathy, prostate cancer with TURP 2016, anxiety/depression, hypertension, quit smoking 48 years ago, osteoarthritis bilateral knees status post TKA.  Per chart review lives with spouse.  1 level home with level entry.  Independent ADLs prior to admission and driving.  Presented 02/27/2021 [facility] [place] with acute onset of left-sided weakness as well as headache .  CT angiogram head and neck showed subacute right PCA territory infarct with probable small foci of petechial hemorrhage.  Possible focal occlusion of P2 branch at the level of the ambient cistern.  Small remote infarct right parietal lobe.  Patient did not receive tPA.  Admission chemistry unremarkable except creatinine 1.63-2.05, potassium 3.2, troponin 27, urinalysis negative nitrite.  Patient subsequently developed atrial fibrillation with RVR.  He was started on metoprolol as well as Cardizem drip.  Echocardiogram with ejection fraction of 60 to 65% no wall motion abnormalities.  Neurology follow-up maintained on Eliquis for CVA prophylaxis as well as atrial fibrillation.  AKI with creatinine 1.63 renal ultrasound negative for hydronephrosis initially maintained on IV fluids with latest creatinine 1.64.  He is tolerating a regular diet.  Therapy evaluations completed due  to patient's left-sided weakness and decreased functional mobility was admitted for a comprehensive rehab program. Patient transferred to [facility] on 03/05/2021 .   Patient currently requires min with mobility secondary to muscle weakness, decreased cardiorespiratoy endurance, motor  apraxia, ataxia, and decreased coordination, decreased visual perceptual skills, decreased visual motor skills, and field cut, decreased attention to left and decreased motor planning, decreased initiation, decreased attention, decreased awareness, decreased problem solving, and decreased safety awareness, and decreased standing balance, decreased postural control, hemiplegia, and decreased balance strategies.  Prior to hospitalization, patient was independent  with mobility and lived with Spouse in a House home.  Home access is  Level entry.  Patient will benefit from skilled PT intervention to maximize safe functional mobility, minimize fall risk, and decrease caregiver burden for planned discharge home with 24 hour supervision.  Anticipate patient will benefit from follow up OP at discharge.  PT - End of Session Activity Tolerance: Tolerates 30+ min activity with multiple rests Endurance Deficit: Yes Endurance Deficit Description: Multiple rest breaks needed b/w functional mobility tasks PT Assessment Rehab Potential (ACUTE/IP ONLY): Good PT Barriers to Discharge: Insurance for SNF coverage PT Barriers to Discharge Comments: Ataxia, balance deficits, high falls risk PT Patient demonstrates impairments in the following area(s): Balance;Endurance;Motor;Pain;Safety;Sensory PT Transfers Functional Problem(s): Bed Mobility;Bed to Chair;Car PT Locomotion Functional Problem(s): Ambulation;Stairs PT Plan PT Intensity: Minimum of 1-2 x/day ,45 to 90 minutes PT Frequency: 5 out of 7 days PT Duration Estimated Length of Stay: 10-14 days PT Treatment/Interventions: Ambulation/gait training;Balance/vestibular training;Cognitive remediation/compensation;Community reintegration;Discharge planning;Disease management/prevention;DME/adaptive equipment instruction;Functional electrical stimulation;Functional mobility training;Neuromuscular re-education;Pain management;Patient/family education;Psychosocial  support;Skin care/wound management;Splinting/orthotics;Stair training;Therapeutic Activities;Therapeutic Exercise;UE/LE Strength taining/ROM;UE/LE Coordination activities;Visual/perceptual remediation/compensation;Wheelchair propulsion/positioning PT Transfers Anticipated Outcome(s): supervision PT Locomotion Anticipated Outcome(s): supervision PT Recommendation Follow Up Recommendations: Outpatient PT;24 hour supervision/assistance Patient destination: Home Equipment Recommended: To be determined Equipment Details: Pt owns no DME   PT Evaluation Precautions/Restrictions Precautions Precautions: Fall Precaution Comments: L inattention Restrictions Weight Bearing Restrictions: No Pain Pain Assessment Pain Scale: 0-10 Pain Score: 6  Pain Type: Chronic pain (arthritis) Pain Location: Shoulder Pain Orientation: Left Pain Descriptors / Indicators: Sharp Pain Intervention(s): Repositioned;Emotional support;Ambulation/increased activity Pain Interference Pain Interference Pain Effect on Sleep: 2. Occasionally Pain Interference with Therapy Activities: 2. Occasionally Pain Interference with Day-to-Day Activities: 2. 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 - History Ability to See in Adequate Light: 1 Impaired Vision - Assessment Tracking/Visual Pursuits: Decreased smoothness of horizontal tracking;Decreased smoothness of vertical tracking;Requires cues,  head turns, or add eye shifts to track Convergence: Impaired (comment) (unable) Additional Comments: L visual field cut Perception Perception: Impaired Inattention/Neglect: Does not attend to left visual field;Does not attend to left side of body Praxis Praxis: Impaired Praxis Impairment Details: Motor planning;Ideomotor  Cognition Overall Cognitive Status: Impaired/Different from baseline Arousal/Alertness: Awake/alert Orientation Level: Oriented X4 Year: 2022 Month: November Day of Week: Correct Immediate Memory Recall: Sock;Blue;Bed Memory Recall Sock: Without Cue Memory Recall Blue: Without Cue Memory Recall Bed: Without Cue Problem Solving: Impaired Problem Solving Impairment: Verbal complex Self Monitoring: Impaired Self Monitoring Impairment: Verbal complex Behaviors: Impulsive (mild) Safety/Judgment: Appears intact Sensation  Sensation Light Touch: Impaired by gross assessment Peripheral sensation comments: Able to discern light touch but diminished. Reports baseline neuropathy in feet Light Touch Impaired Details: Impaired RUE;Impaired LUE;Impaired RLE;Impaired LLE (chemo induced PN at baseline, pt reports has not worsened s/p CVA) Hot/Cold: Appears Intact Proprioception: Impaired by gross assessment Stereognosis: Not tested Coordination Gross Motor Movements are Fluid and Coordinated: No Fine Motor Movements are Fluid and Coordinated: No Coordination and Movement Description: Lt sided incoordination Finger Nose Finger Test: Ataxic Lt side, WNL Rt side Heel Shin Test: Ataxic L side Motor  Motor Motor: Ataxia;Hemiplegia Motor - Skilled Clinical Observations: L side ataxia in UE and LE   Trunk/Postural Assessment  Cervical Assessment Cervical Assessment: Exceptions to [facility] (forward head) Thoracic Assessment Thoracic Assessment: Exceptions to [facility] (rounded shoulder) Lumbar Assessment Lumbar Assessment: Exceptions to [facility] (post pelvic tilt) Postural  Control Postural Control: Deficits on evaluation (postural sway in standing. Posterior bias)  Balance Balance Balance Assessed: Yes Standardized Balance Assessment Standardized Balance Assessment: Berg Balance Test Berg Balance Test Sit to Stand: Able to stand without using hands and stabilize independently Standing Unsupported: Able to stand 2 minutes with supervision Sitting with Back Unsupported but Feet Supported on Floor or Stool: Able to sit safely and securely 2 minutes Stand to Sit: Sits safely with minimal use of hands Transfers: Able to transfer with verbal cueing and /or supervision Standing Unsupported with Eyes Closed: Needs help to keep from falling Standing Ubsupported with Feet Together: Needs help to attain position and unable to hold for 15 seconds From Standing, Reach Forward with Outstretched Arm: Loses balance while trying/requires external support From Standing Position, Pick up Object from Floor: Unable to try/needs assist to keep balance From Standing Position, Turn to Look Behind Over each Shoulder: Needs assist to keep from losing balance and falling Turn 360 Degrees: Needs assistance while turning Standing Unsupported, Alternately Place Feet on Step/Stool: Needs assistance to keep from falling or unable to try Standing Unsupported, One Foot in Front: Loses balance while stepping or standing Standing on One Leg: Unable to try or needs assist to prevent fall Total Score: 17/56 Dynamic Sitting Balance Dynamic Sitting - Balance Support: During functional activity Dynamic Sitting - Level of Assistance: 5: Stand by assistance (donning Ted hose) Dynamic Standing Balance Dynamic Standing - Balance Support: During functional activity;No upper extremity supported Dynamic Standing - Level of Assistance: 4: Min assist Dynamic Standing - Balance Activities: Forward lean/weight shifting;Lateral lean/weight shifting (perihygiene) Extremity Assessment  RUE Assessment RUE  Assessment: Within Functional Limits Active Range of Motion (AROM) Comments: WNL General Strength Comments: 4+/5 grossly LUE Assessment LUE Assessment:  (mildly ataxic, pt dropping multiple ADL items when using Lt hand, needing cues to recognize) Active Range of Motion (AROM) Comments: WNL General Strength Comments: 4+/5 grossly RLE Assessment RLE Assessment: Within Functional Limits LLE Assessment LLE Assessment: Exceptions to [facility] LLE Strength LLE Overall Strength: Deficits Left Hip Flexion: 3+/5 Left Knee Flexion: 3+/5 Left Knee Extension: 4/5 Left Ankle Dorsiflexion: 4-/5  Care Tool Care Tool Bed Mobility Roll left and right activity   Roll left and right assist level: Supervision/Verbal cueing    Sit to lying activity   Sit to lying assist level: Supervision/Verbal cueing    Lying to sitting on side of bed activity   Lying to sitting on side of bed assist level: the ability to move from lying on the back to sitting on the side of the bed with no back support.: Supervision/Verbal cueing     Care  Tool Transfers Sit to stand transfer   Sit to stand assist level: Minimal Assistance - Patient > 75%    Chair/bed transfer   Chair/bed transfer assist level: Minimal Assistance - Patient > 75%     Toilet transfer Toilet transfer activity did not occur: N/A (not assessed due to time constraints)      Scientist, product/process development transfer activity did not occur: Environmental limitations        Care Tool Locomotion Ambulation   Assist level: Moderate Assistance - Patient 50 - 74% Assistive device: No Device Max distance: 59f  Walk 10 feet activity   Assist level: Moderate Assistance - Patient - 50 - 74% Assistive device: No Device   Walk 50 feet with 2 turns activity   Assist level: Moderate Assistance - Patient - 50 - 74% Assistive device: No Device  Walk 150 feet activity Walk 150 feet activity did not occur: Safety/medical concerns      Walk 10 feet on uneven surfaces  activity Walk 10 feet on uneven surfaces activity did not occur: Safety/medical concerns      Stairs Stair activity did not occur: Safety/medical concerns        Walk up/down 1 step activity Walk up/down 1 step or curb (drop down) activity did not occur: Safety/medical concerns      Walk up/down 4 steps activity Walk up/down 4 steps activity did not occur: Safety/medical concerns      Walk up/down 12 steps activity Walk up/down 12 steps activity did not occur: Safety/medical concerns      Pick up small objects from floor   Pick up small object from the floor assist level: Minimal Assistance - Patient > 75% Pick up small object from the floor assistive device: no AD  Wheelchair Is the patient using a wheelchair?: No   Wheelchair activity did not occur: N/A      Wheel 50 feet with 2 turns activity Wheelchair 50 feet with 2 turns activity did not occur: N/A    Wheel 150 feet activity Wheelchair 150 feet activity did not occur: N/A      Refer to Care Plan for Long Term Goals  SHORT TERM GOAL WEEK 1 PT Short Term Goal 1 (Week 1): Pt will complete bed mobility mod I PT Short Term Goal 2 (Week 1): Pt will complete bed<>chair transfers with CGA and LRAD PT Short Term Goal 3 (Week 1): Pt will ambulate 1569fwith CGA and LRAD PT Short Term Goal 4 (Week 1): Pt will initiate stair training  Recommendations for other services: None   Skilled Therapeutic Intervention Mobility Bed Mobility Bed Mobility: Rolling Right;Rolling Left;Supine to Sit;Sit to Supine Rolling Right: Supervision/verbal cueing Rolling Left: Supervision/Verbal cueing Supine to Sit: Supervision/Verbal cueing Sit to Supine: Supervision/Verbal cueing Transfers Transfers: Sit to Stand;Stand to Sit;Stand Pivot Transfers Sit to Stand: Minimal Assistance - Patient > 75%;Contact Guard/Touching assist Stand to Sit: Contact Guard/Touching assist;Minimal Assistance - Patient > 75% Stand Pivot Transfers: Minimal  Assistance - Patient > 75% Stand Pivot Transfer Details: Manual facilitation for weight shifting;Verbal cues for safe use of DME/AE;Verbal cues for gait pattern;Verbal cues for sequencing;Visual cues/gestures for precautions/safety;Visual cues for safe use of DME/AE;Tactile cues for weight shifting Transfer (Assistive device): Rolling walker Locomotion  Gait Ambulation: Yes Gait Assistance: Minimal Assistance - Patient > 75% Gait Distance (Feet): 150 Feet Assistive device: Rolling walker Gait Assistance Details: Verbal cues for gait pattern;Verbal cues for safe use of DME/AE;Verbal cues for precautions/safety;Verbal cues for technique;Verbal cues for sequencing;Tactile  cues for weight shifting;Tactile cues for posture Gait Gait: Yes Gait Pattern: Impaired Gait Pattern: Step-to pattern;Decreased step length - right;Decreased step length - left;Decreased dorsiflexion - left;Decreased weight shift to left;Poor foot clearance - left;Ataxic;Narrow base of support;Trunk flexed Gait velocity: decreased Stairs / Additional Locomotion Stairs: No Wheelchair Mobility Wheelchair Mobility: No  Skilled Intervention: Pt greeted supine in bed to start session with wife at bedside.   Instructed pt in results of PT evaluation as detailed above, PT POC, rehab potential, rehab goals, and discharge recommendations. Additionally discussed CIR's policies regarding fall safety and use of chair alarm and/or quick release belt. Pt verbalized understanding and in agreement.   Pt mildly impulsive with abrupt sitting to EOB - completed at distant supervision level with HOB flat, use of bed rails. Able to sit unsupported at EOB with SBA while discussing the above. Pt very motivated, critical of himself. Reports h/o anxiety and panic attacks. Pt presents with mild L sided weakness in UE and LE and ataxia of LUE and LLE. He requires minA for sit<>stands to RW, can ambulate 154f with minA and RW, requires modA to ambulate  744fwith no AD. His primary gait deficits are ataxia of LLE, decreased ankle DF of LLE during swing with frequent catching during swing, narrow BOS, and step-to gait pattern.  He completed the BERG balance test (see above for details).  Patient demonstrates increased fall risk as noted by score of 17/56 on Berg Balance Scale.  (<36= high risk for falls, close to 100%; 37-45 significant >80%; 46-51 moderate >50%; 52-55 lower >25%)  Pt concluded session supine in bed with bed alarm on, wife at bedside, all needs in reach.   Discharge Criteria: Patient will be discharged from PT if patient refuses treatment 3 consecutive times without medical reason, if treatment goals not met, if there is a change in medical status, if patient makes no progress towards goals or if patient is discharged from hospital.  The above assessment, treatment plan, treatment alternatives and goals were discussed and mutually agreed upon: by patient  ChAlger SimonsT, DPT 03/06/2021, 12:22 PM

## 2021-03-06 NOTE — H&P (Signed)
Physical Medicine and Rehabilitation Admission H&P        Chief Complaint  Patient presents with   Stroke Symptoms  : HPI: George [name], George [name]. is a 75 year old right-handed male with history of colon cancer status postresection 2009 with chemotherapy, chemotherapy-induced peripheral neuropathy, prostate cancer with TURP 2016, anxiety/depression, hypertension, quit smoking 48 years ago, osteoarthritis bilateral knees status post TKA.  Per chart review lives with spouse.  1 level home with level entry.  Independent ADLs prior to admission and driving.  Presented 02/27/2021 [facility] with acute onset of left-sided weakness as well as headache .  CT angiogram head and neck showed subacute right PCA territory infarct with probable small foci of petechial hemorrhage.  Possible focal occlusion of P2 branch at the level of the ambient cistern.  Small remote infarct right parietal lobe.  Patient did not receive tPA.  Admission chemistry unremarkable except creatinine 1.63-2.05, potassium 3.2, troponin 27, urinalysis negative nitrite.  Patient subsequently developed atrial fibrillation with RVR.  He was started on metoprolol as well as Cardizem drip.  Echocardiogram with ejection fraction of 60 to 65% no wall motion abnormalities.  Neurology follow-up maintained on Eliquis for CVA prophylaxis as well as atrial fibrillation.  AKI with creatinine 1.63 renal ultrasound negative for hydronephrosis initially maintained on IV fluids with latest creatinine 1.64.  He is tolerating a regular diet.  Therapy evaluations completed due to patient's left-sided weakness and decreased functional mobility was admitted for a comprehensive rehab program.     Pt reports having increased panic attacks- on high dose Wellbutrin- doesn't want to change due to depression in past- but willing to try Paxil which is SSRI to add for anxiety.  LBM last night; peeing OK.  No pain; c/o coordination issues.      Review of  Systems  Constitutional:  Negative for chills and fever.  HENT:  Negative for hearing loss.   Eyes:  Negative for blurred vision.  Respiratory:  Negative for cough and shortness of breath.   Cardiovascular:  Positive for palpitations. Negative for chest pain and leg swelling.  Gastrointestinal:  Positive for constipation. Negative for heartburn, nausea and vomiting.  Genitourinary:  Negative for dysuria and hematuria.  Musculoskeletal:  Positive for joint pain and myalgias.  Skin:  Negative for rash.  Neurological:  Positive for weakness.  Psychiatric/Behavioral:  Positive for depression.        Anxiety/panic attacks  All other systems reviewed and are negative.     Past Medical History:  Diagnosis Date   Anxiety     Arthritis      OSTEO IN KNEE   Asthma      as child   Colon cancer ([place])     Depression     Elevated prostate specific antigen (PSA)     Generalized anxiety disorder 04/09/2015   Hematospermia     Hx antineoplastic chemotherapy 2010   Hypertension     Idiopathic peripheral neuropathy      TOES OF BOTH FEET DUE TO CHEMO   Incomplete bladder emptying     Malignant neoplasm of prostate ([place])     Nodular prostate with lower urinary tract symptoms     Panic attacks 04/09/2015   Primary localized osteoarthrosis of the knee, right 05/19/2016   Prostatitis     S/P total knee arthroplasty, left 05/19/2016   Spermatocele     Weak urinary stream           Past Surgical History:  Procedure Laterality Date   COLON RESECTION    NOV 2009   COLONOSCOPY       PROSTATE BIOPSY       SPERMATOCELECTOMY Left 05/07/2014    Procedure: LEFT SPERMATOCELECTOMY;  Surgeon: Malka So, MD;  Location: [facility];  Service: Urology;  Laterality: Left;   TONSILLECTOMY       TOTAL KNEE ARTHROPLASTY Left 04/21/2015    Procedure: LEFT TOTAL KNEE ARTHROPLASTY;  Surgeon: Elsie Saas, MD;  Location: [facility];  Service: Orthopedics;  Laterality: Left;   TOTAL KNEE ARTHROPLASTY Right 05/31/2016     Procedure: TOTAL KNEE ARTHROPLASTY;  Surgeon: Elsie Saas, MD;  Location: [facility];  Service: Orthopedics;  Laterality: Right;   TRANSURETHRAL RESECTION OF PROSTATE N/A 05/07/2014    Procedure: TRANSURETHRAL RESECTION OF THE PROSTATE WITH GYRUS INSTRUMENTS;  Surgeon: Malka So, MD;  Location: [facility];  Service: Urology;  Laterality: N/A;         Family History  Problem Relation Age of Onset   Stroke Father     Cancer Brother          neoplasm of brain    Social History:  reports that he quit smoking about 48 years ago. His smoking use included cigarettes. He has a 0.75 pack-year smoking history. He has never used smokeless tobacco. He reports current alcohol use of about 1.0 standard drink per week. He reports that he does not use drugs. Allergies:       Allergies  Allergen Reactions   Amlodipine Other (See Comments)      Dizziness    Gabapentin        hallucinations          Medications Prior to Admission  Medication Sig Dispense Refill   acetaminophen (TYLENOL) 500 MG tablet Take 500 mg by mouth every 6 (six) hours as needed for mild pain or headache.       atorvastatin (LIPITOR) 20 MG tablet Take 20 mg by mouth daily.       buPROPion (WELLBUTRIN XL) 150 MG 24 hr tablet Take 450 mg by mouth every morning.        felodipine (PLENDIL) 10 MG 24 hr tablet Take 10 tablets by mouth daily.       losartan (COZAAR) 100 MG tablet Take 100 mg by mouth daily.       metoprolol succinate (TOPROL-XL) 50 MG 24 hr tablet Take 50 mg by mouth daily. Take with or immediately following a meal.       oxymetazoline (AFRIN) 0.05 % nasal spray Place 2 sprays into both nostrils 2 (two) times daily as needed for congestion.       VITAMIN D PO Take 1 capsule by mouth daily.          Drug Regimen Review Drug regimen was reviewed and remains appropriate with no significant issues identified   Home: Home Living Family/patient expects to be discharged to:: Private residence Living Arrangements:  Spouse/significant other Available Help at Discharge: Family, Available 24 hours/day Type of Home: House Home Access: Level entry Home Layout: One level Bathroom Shower/Tub: Multimedia programmer: Associate Professor Accessibility: Yes Home Equipment: None  Lives With: Spouse   Functional History: Prior Function Prior Level of Function : Independent/Modified Independent, Driving Mobility Comments: no use of AD ADLs Comments: Drives, Independent in all daily tasks. previously designed and worked on houses   Functional Status:  Mobility: Bed Mobility Overal bed mobility: Needs Assistance Bed Mobility: Supine to Sit Supine to  sit: Supervision Sit to supine: Supervision General bed mobility comments: verbal cues for LLE and LUE as coming off the bed (leaving them behind) Transfers Overall transfer level: Needs assistance Equipment used: Rolling walker (2 wheels) Transfers: Sit to/from Stand Sit to Stand: Mod assist Bed to/from chair/wheelchair/BSC transfer type:: Step pivot Stand pivot transfers: Max assist, +2 physical assistance, +2 safety/equipment Step pivot transfers: Mod assist General transfer comment: dyscoordinated as trying to push off the bed and imbalance requiring mod assist to maintain balance and get LUE on RW Ambulation/Gait Ambulation/Gait assistance: Min assist Gait Distance (Feet): 30 Feet Assistive device: Rolling walker (2 wheels) Gait Pattern/deviations: Step-to pattern, Ataxic General Gait Details: pt with slowed step-to gait, reduced step length bilaterally, pt with ataxia of LLE Gait velocity: reduced Gait velocity interpretation: <1.31 ft/sec, indicative of household ambulator   ADL: ADL Overall ADL's : Needs assistance/impaired Eating/Feeding: Minimal assistance, Sitting Grooming: Minimal assistance, Sitting Upper Body Bathing: Moderate assistance, Sitting Lower Body Bathing: Maximal assistance, +2 for physical assistance, +2 for  safety/equipment, Sit to/from stand Upper Body Dressing : Moderate assistance, Sitting Lower Body Dressing: Maximal assistance, +2 for physical assistance, +2 for safety/equipment, Sit to/from stand Toilet Transfer: Maximal assistance, +2 for physical assistance, +2 for safety/equipment, Stand-pivot, BSC/3in1 Toilet Transfer Details (indicate cue type and reason): Max A x 2 for pivot to/from [facility] with manual assist. On entry, pt nephew assisting pt to EOB for bathroom mobility - due to noted L sided deficits, encouraged use of BSC instead due to high fall risk Toileting- Clothing Manipulation and Hygiene: Maximal assistance, +2 for physical assistance, +2 for safety/equipment, Sit to/from stand Toileting - Clothing Manipulation Details (indicate cue type and reason): unable to void on BSC. +2 assist for clothing mgmt and balance General ADL Comments: pt delined ADLs this session. Spent attending to and locating and obtaining items to his left   Cognition: Cognition Overall Cognitive Status: Impaired/Different from baseline Arousal/Alertness: Awake/alert Orientation Level: Oriented X4 Year: 2022 Month: November Day of Week: Correct Memory: Appears intact (recalled 5/5 words after 2 minute delay) Problem Solving: Impaired Problem Solving Impairment: Verbal complex Executive Function: Self Monitoring Self Monitoring: Impaired Self Monitoring Impairment: Verbal complex Safety/Judgment: Appears intact Cognition Arousal/Alertness: Awake/alert Behavior During Therapy: WFL for tasks assessed/performed Overall Cognitive Status: Impaired/Different from baseline Area of Impairment: Awareness Orientation Level: Disoriented to, Situation Current Attention Level: Sustained Memory: Decreased recall of precautions Following Commands: Follows one step commands with increased time Safety/Judgement: Decreased awareness of deficits Awareness: Emergent Problem Solving: Requires verbal cues General  Comments: Pt with improved awareness of deficits stating that his L side is incoordinated and weak and he has trouble "paying attention" to the L side. Pt also admitted that he is at risk for falls.   Physical Exam: Blood pressure (!) 140/92, pulse (!) 108, temperature 97.8 F (36.6 C), temperature source Oral, resp. rate 16, height 6\' 1"  (1.854 m), weight 93.1 kg, SpO2 96 %. Physical Exam Vitals and nursing note reviewed. Exam conducted with a chaperone present.  Constitutional:      General: He is not in acute distress.    Appearance: He is normal weight. He is not ill-appearing.     Comments: Sitting up in bed; family and other doctor at bedside; pt has dysconjugate gaze at rest, NAD  HENT:     Head: Normocephalic and atraumatic.     Comments: Trace L facial droop; tongue midline    Nose: Nose normal. No congestion.     Mouth/Throat:  Mouth: Mucous membranes are dry.     Pharynx: Oropharynx is clear. No oropharyngeal exudate.  Eyes:     Comments: EOMI B/L, but L eye delayed; no nystagmus; L visual field cut- hemianopsia  Cardiovascular:     Rate and Rhythm: Regular rhythm.     Heart sounds: Normal heart sounds. No murmur heard.   No gallop.     Comments: Rate low 60s- no JVD Pulmonary:     Comments: CTA B/L- no W/R/R- good air movement   Abdominal:     Comments: Soft, NT, ND, (+)BS -normoactive  Musculoskeletal:     Cervical back: Normal range of motion. No rigidity.     Comments: R side 5/5 in RUE/RLE LUE- 4+/5 in Biceps, triceps, WE, grip and FA LLE- 5-/5 in HF/KE/DF and PF  Skin:    General: Skin is warm and dry.  Neurological:     Mental Status: He is alert.     Comments: Patient is alert.  Makes eye contact with examiner with noted left hemianopsia.  Follows simple commands.  Provides name and age some delay in processing. Intact to light touch in all 4 extremities Finger to nose searching pattern on L side Coordination impaired on L side- touching all fingers  with thumb L pronator drift- mild to moderate  Psychiatric:     Comments: Slightly anxious c/o panic attacks      Lab Results Last 48 Hours        Results for orders placed or performed during the hospital encounter of 02/27/21 (from the past 48 hour(s))  Basic metabolic panel     Status: Abnormal    Collection Time: 03/03/21 12:55 AM  Result Value Ref Range    Sodium 136 135 - 145 mmol/L    Potassium 3.3 (L) 3.5 - 5.1 mmol/L    Chloride 110 98 - 111 mmol/L    CO2 19 (L) 22 - 32 mmol/L    Glucose, Bld 88 70 - 99 mg/dL      Comment: Glucose reference range applies only to samples taken after fasting for at least 8 hours.    BUN 13 8 - 23 mg/dL    Creatinine, Ser 1.62 (H) 0.61 - 1.24 mg/dL    Calcium 8.4 (L) 8.9 - 10.3 mg/dL    GFR, Estimated 43 (L) >60 mL/min      Comment: (NOTE) Calculated using the CKD-EPI Creatinine Equation (2021)      Anion gap 7 5 - 15      Comment: Performed at [facility] [address], [place], [place] [address]  CBC     Status: Abnormal    Collection Time: 03/04/21  1:08 AM  Result Value Ref Range    WBC 7.9 4.0 - 10.5 K/uL    RBC 3.51 (L) 4.22 - 5.81 MIL/uL    Hemoglobin 10.3 (L) 13.0 - 17.0 g/dL    HCT 31.3 (L) 39.0 - 52.0 %    MCV 89.2 80.0 - 100.0 fL    MCH 29.3 26.0 - 34.0 pg    MCHC 32.9 30.0 - 36.0 g/dL    RDW 13.7 11.5 - 15.5 %    Platelets 282 150 - 400 K/uL    nRBC 0.0 0.0 - 0.2 %      Comment: Performed at [facility], [place] [address], [place], [place] [address]  Basic metabolic panel     Status: Abnormal    Collection Time: 03/04/21  1:08 AM  Result  Value Ref Range    Sodium 138 135 - 145 mmol/L    Potassium 3.3 (L) 3.5 - 5.1 mmol/L    Chloride 109 98 - 111 mmol/L    CO2 21 (L) 22 - 32 mmol/L    Glucose, Bld 87 70 - 99 mg/dL      Comment: Glucose reference range applies only to samples taken after fasting for at least 8 hours.    BUN 12 8 - 23 mg/dL    Creatinine, Ser 1.64 (H) 0.61 - 1.24 mg/dL    Calcium 8.5 (L)  8.9 - 10.3 mg/dL    GFR, Estimated 43 (L) >60 mL/min      Comment: (NOTE) Calculated using the CKD-EPI Creatinine Equation (2021)      Anion gap 8 5 - 15      Comment: Performed at [facility] [address] [place], [place] [address]  Magnesium     Status: None    Collection Time: 03/04/21  1:08 AM  Result Value Ref Range    Magnesium 1.9 1.7 - 2.4 mg/dL      Comment: Performed at [facility] [address], [place], [place] [address]      Imaging Results (Last 48 hours)  No results found.           Medical Problem List and Plan: 1.  Left-sided weakness functional deficits secondary to subacute right PCA infarct secondary new onset atrial fibrillation             -patient may  shower             -ELOS/Goals: 13-15 days supervision 2.  Antithrombotics: -DVT/anticoagulation:  Pharmaceutical: Other (comment) Eliquis             -antiplatelet therapy: N/A 3. Pain Management: Hydrocodone as needed 4. Mood: Wellbutrin 450 mg daily             -will add Paxil 20 mg QHS for panic attacks, since can mix with Wellbutrin- went over with pt.              -antipsychotic agents: N/A 5. Neuropsych: This patient is capable of making decisions on his own behalf. 6. Skin/Wound Care: Routine skin checks 7. Fluids/Electrolytes/Nutrition: Routine in and outs with follow-up chemistries 8.  Permissive hypertension.  Presently on Toprol-XL 125 mg daily.  Monitor with increased mobility.  Patient also on losartan 100 mg daily, Aldactone 25 mg daily, HCTZ 25 mg daily prior to admission.  Resume as needed 9.  Hyperlipidemia.  Lipitor 10.  AKI.  Renal ultrasound negative.  Follow-up chemistries             Cr 2.0- will push fluids and recheck in AM 11.  New onset atrial fibrillation.  Continue Eliquis.  Cardiac rate controlled 12.  History of colon cancer with resection in 2009/chemotherapy.  Follow-up outpatient 13.  History of prostate cancer TURP 2016.  Follow-up outpatient   I  have personally performed a face to face diagnostic evaluation of this patient and formulated the key components of the plan.  Additionally, I have personally reviewed laboratory data, imaging studies, as well as relevant notes and concur with the physician assistant's documentation above.   The patient's status has not changed from the original H&P.  Any changes in documentation from the acute care chart have been noted above.       Lavon Paganini Angiulli, PA-C 03/04/2021

## 2021-03-06 NOTE — Evaluation (Signed)
Speech Language Pathology Assessment and Plan  Patient Details  Name: George [name]. MRN: [id number] Date of Birth: 1945-11-13  SLP Diagnosis: Cognitive Impairments  Rehab Potential: Good ELOS: 2 weeks    Today's Date: 03/06/2021 SLP Individual Time: 2130-8657 SLP Individual Time Calculation (min): 50 min   Hospital Problem: Principal Problem:   Subcortical infarction [facility])  Past Medical History:  Past Medical History:  Diagnosis Date   Anxiety    Arthritis    OSTEO IN KNEE   Asthma    as child   Colon cancer ([place])    Depression    Elevated prostate specific antigen (PSA)    Generalized anxiety disorder 04/09/2015   Hematospermia    Hx antineoplastic chemotherapy 2010   Hypertension    Idiopathic peripheral neuropathy    TOES OF BOTH FEET DUE TO CHEMO   Incomplete bladder emptying    Malignant neoplasm of prostate ([place])    Nodular prostate with lower urinary tract symptoms    Panic attacks 04/09/2015   Primary localized osteoarthrosis of the knee, right 05/19/2016   Prostatitis    S/P total knee arthroplasty, left 05/19/2016   Spermatocele    Weak urinary stream    Past Surgical History:  Past Surgical History:  Procedure Laterality Date   COLON RESECTION   NOV 2009   COLONOSCOPY     PROSTATE BIOPSY     SPERMATOCELECTOMY Left 05/07/2014   Procedure: LEFT SPERMATOCELECTOMY;  Surgeon: Malka So, MD;  Location: [facility];  Service: Urology;  Laterality: Left;   TONSILLECTOMY     TOTAL KNEE ARTHROPLASTY Left 04/21/2015   Procedure: LEFT TOTAL KNEE ARTHROPLASTY;  Surgeon: Elsie Saas, MD;  Location: [facility];  Service: Orthopedics;  Laterality: Left;   TOTAL KNEE ARTHROPLASTY Right 05/31/2016   Procedure: TOTAL KNEE ARTHROPLASTY;  Surgeon: Elsie Saas, MD;  Location: [facility];  Service: Orthopedics;  Laterality: Right;   TRANSURETHRAL RESECTION OF PROSTATE N/A 05/07/2014   Procedure: TRANSURETHRAL RESECTION OF THE PROSTATE WITH GYRUS INSTRUMENTS;  Surgeon: Malka So,  MD;  Location: [facility];  Service: Urology;  Laterality: N/A;    Assessment / Plan / Recommendation  George [name], [place]. is a 75 year old right-handed male with history of colon cancer status postresection 2009 with chemotherapy, chemotherapy-induced peripheral neuropathy, prostate cancer with TURP 2016, anxiety/depression, hypertension, quit smoking 48 years ago, osteoarthritis bilateral knees status post TKA.  Per chart review lives with spouse.  1 level home with level entry.  Independent ADLs prior to admission and driving.  Presented 02/27/2021 med [facility] with acute onset of left-sided weakness as well as headache .  CT angiogram head and neck showed subacute right PCA territory infarct with probable small foci of petechial hemorrhage.  Possible focal occlusion of P2 branch at the level of the ambient cistern.  Small remote infarct right parietal lobe.  Patient did not receive tPA.  Admission chemistry unremarkable except creatinine 1.63-2.05, potassium 3.2, troponin 27, urinalysis negative nitrite.  Patient subsequently developed atrial fibrillation with RVR.  He was started on metoprolol as well as Cardizem drip.  Echocardiogram with ejection fraction of 60 to 65% no wall motion abnormalities.  Neurology follow-up maintained on Eliquis for CVA prophylaxis as well as atrial fibrillation.  AKI with creatinine 1.63 renal ultrasound negative for hydronephrosis initially maintained on IV fluids with latest creatinine 1.64.  He is tolerating a regular diet.  Therapy evaluations completed due to patient's left-sided weakness and decreased functional mobility was admitted for a comprehensive  rehab program.   Clinical Impression Patient presents with a mild cognitive deficit as per this evaluation. He was oriented to month, year, situation but stated incorrect date (read incorrect date on board on wall in front of him) and stated time as "2" and when asked further said "afternoon". He then reported  that he had eaten breakfast and lunch. He was able to read right side of clock but not left even when SLP provided moderate amount of cues. He was able to scan to left with larger print (alphabet letters) printed on small dry erase board. He does have a significant left visual field cut per PT and OT and does not demonstrate adequate awareness to this. On Memory section of Cognistat test, his score of 8 placed him in "mild" impairment category. SLP also noted deficits in emerging and anticipatory awareness, reasoning and problem solving at complex level. He will benefit from skilled SLP intervention to maximize cognitive function prior to discharge.  Skilled Therapeutic Interventions          SLE, Cognistat     SLP Assessment  Patient will need skilled Speech Lanaguage Pathology Services during CIR admission    Recommendations  Recommendations for Other Services: Neuropsych consult Patient destination: Home Follow up Recommendations: Other (comment) (TBD pending progress) Equipment Recommended: None recommended by SLP    SLP Frequency 3 to 5 out of 7 days   SLP Duration  SLP Intensity  SLP Treatment/Interventions 2 weeks  Minumum of 1-2 x/day, 30 to 90 minutes  Cognitive remediation/compensation;Environmental controls;Functional tasks;Internal/external aids;Medication managment;Patient/family education    Pain Pain Assessment Pain Scale: 0-10 Pain Score: 0-No pain Pain Type: Chronic pain Pain Location: Shoulder Pain Orientation: Left Pain Descriptors / Indicators: Sharp Pain Intervention(s): Repositioned;Emotional support;Ambulation/increased activity  Prior Functioning Cognitive/Linguistic Baseline: Within functional limits Type of Home: House  Lives With: Spouse Available Help at Discharge: Family;Available 24 hours/day Education: 12th grade Vocation: Retired (retired, was a Animator)  Architectural technologist Overall Cognitive Status: Impaired/Different from  baseline Arousal/Alertness: Awake/alert Orientation Level: Oriented to person;Oriented to place;Oriented to situation;Disoriented to time Year: 2022 Month: November Day of Week: Incorrect Attention: Selective;Alternating Selective Attention: Impaired Selective Attention Impairment: Verbal complex Alternating Attention: Impaired Alternating Attention Impairment: Verbal complex Memory: Impaired Memory Impairment: Decreased recall of new information;Retrieval deficit Immediate Memory Recall:  (recalled 2/4 words without cues after 4 minute delay) Memory Recall Sock: Without Cue Memory Recall Blue: Without Cue Memory Recall Bed: Without Cue Awareness: Impaired Awareness Impairment: Emergent impairment Problem Solving: Impaired Problem Solving Impairment: Verbal complex Executive Function: Self Monitoring;Reasoning Reasoning: Impaired Reasoning Impairment: Verbal complex Self Monitoring: Impaired Self Monitoring Impairment: Verbal complex Behaviors: Impulsive Safety/Judgment: Impaired  Comprehension Auditory Comprehension Overall Auditory Comprehension: Appears within functional limits for tasks assessed Expression Expression Primary Mode of Expression: Verbal Verbal Expression Overall Verbal Expression: Appears within functional limits for tasks assessed Initiation: No impairment Repetition: No impairment Naming: No impairment Pragmatics: No impairment Written Expression Dominant Hand: Right Oral Motor Oral Motor/Sensory Function Overall Oral Motor/Sensory Function: Within functional limits Motor Speech Overall Motor Speech: Appears within functional limits for tasks assessed Respiration: Within functional limits Resonance: Within functional limits Articulation: Within functional limitis Intelligibility: Intelligible Motor Planning: Witnin functional limits  Care Tool Care Tool Cognition Ability to hear (with hearing aid or hearing appliances if normally used  Ability to hear (with hearing aid or hearing appliances if normally used): 1. Minimal difficulty - difficulty in some environments (e.g. when person speaks softly or setting is noisy)  Expression of Ideas and Wants Expression of Ideas and Wants: 4. Without difficulty (complex and basic) - expresses complex messages without difficulty and with speech that is clear and easy to understand   Understanding Verbal and Non-Verbal Content Understanding Verbal and Non-Verbal Content: 4. Understands (complex and basic) - clear comprehension without cues or repetitions  Memory/Recall Ability Memory/Recall Ability : Current season;That he or she is in a hospital/hospital unit    Short Term Goals: Week 1: SLP Short Term Goal 1 (Week 1): Patient will orient self to time via use of calendar and clock with min-modA verbal, visual cues to compensate for visual deficit. SLP Short Term Goal 2 (Week 1): Patient will perform complex level problem solving tasks (medication management, money management) with minA verbal and visual cues for accuracy. SLP Short Term Goal 3 (Week 1): Patient will demonstrate effective use of learned strategies to compensate for his physical and visual deficits during function tasks, simulated ADL's with minA verbal and visual cues. SLP Short Term Goal 4 (Week 1): Patient will recall and describe specific daily medical and therapeutic interventions with minA verbal cues for accuracy and use of memory aides as needed. SLP Short Term Goal 5 (Week 1): Patient will demonstrate adequate awareness to errors during functional task performance with min-modA verbal, visual cues.  Refer to Care Plan for Long Term Goals  Recommendations for other services: Neuropsych  Discharge Criteria: Patient will be discharged from SLP if patient refuses treatment 3 consecutive times without medical reason, if treatment goals not met, if there is a change in medical status, if patient makes no progress towards  goals or if patient is discharged from hospital.  The above assessment, treatment plan, treatment alternatives and goals were discussed and mutually agreed upon: by patient  Sonia Baller, MA, CCC-SLP Speech Therapy

## 2021-03-06 NOTE — Plan of Care (Signed)
  Problem: RH Balance Goal: LTG Patient will maintain dynamic standing with ADLs (OT) Description: LTG:  Patient will maintain dynamic standing balance with assist during activities of daily living (OT)  Flowsheets (Taken 03/06/2021 1056) LTG: Pt will maintain dynamic standing balance during ADLs with: Supervision/Verbal cueing   Problem: Sit to Stand Goal: LTG:  Patient will perform sit to stand in prep for activites of daily living with assistance level (OT) Description: LTG:  Patient will perform sit to stand in prep for activites of daily living with assistance level (OT) Flowsheets (Taken 03/06/2021 1056) LTG: PT will perform sit to stand in prep for activites of daily living with assistance level: Supervision/Verbal cueing   Problem: RH Grooming Goal: LTG Patient will perform grooming w/assist,cues/equip (OT) Description: LTG: Patient will perform grooming with assist, with/without cues using equipment (OT) Flowsheets (Taken 03/06/2021 1056) LTG: Pt will perform grooming with assistance level of: Supervision/Verbal cueing   Problem: RH Bathing Goal: LTG Patient will bathe all body parts with assist levels (OT) Description: LTG: Patient will bathe all body parts with assist levels (OT) Flowsheets (Taken 03/06/2021 1056) LTG: Pt will perform bathing with assistance level/cueing: Supervision/Verbal cueing   Problem: RH Dressing Goal: LTG Patient will perform upper body dressing (OT) Description: LTG Patient will perform upper body dressing with assist, with/without cues (OT). Flowsheets (Taken 03/06/2021 1056) LTG: Pt will perform upper body dressing with assistance level of: Set up assist Goal: LTG Patient will perform lower body dressing w/assist (OT) Description: LTG: Patient will perform lower body dressing with assist, with/without cues in positioning using equipment (OT) Flowsheets (Taken 03/06/2021 1056) LTG: Pt will perform lower body dressing with assistance level of:  Supervision/Verbal cueing   Problem: RH Toileting Goal: LTG Patient will perform toileting task (3/3 steps) with assistance level (OT) Description: LTG: Patient will perform toileting task (3/3 steps) with assistance level (OT)  Flowsheets (Taken 03/06/2021 1056) LTG: Pt will perform toileting task (3/3 steps) with assistance level: Supervision/Verbal cueing   Problem: RH Toilet Transfers Goal: LTG Patient will perform toilet transfers w/assist (OT) Description: LTG: Patient will perform toilet transfers with assist, with/without cues using equipment (OT) Flowsheets (Taken 03/06/2021 1056) LTG: Pt will perform toilet transfers with assistance level of: Supervision/Verbal cueing   Problem: RH Tub/Shower Transfers Goal: LTG Patient will perform tub/shower transfers w/assist (OT) Description: LTG: Patient will perform tub/shower transfers with assist, with/without cues using equipment (OT) Flowsheets (Taken 03/06/2021 1056) LTG: Pt will perform tub/shower stall transfers with assistance level of: Supervision/Verbal cueing

## 2021-03-06 NOTE — Progress Notes (Signed)
  Pt asking about meds for anxiety- Paxil started last night- having "panic attack" per pt this AM- wife at bedside.   Plan: Will add Buspar 5 mg TID Prn, and add Paxil 20 mg QHS  2. Will add Compazine 10 mg q6 hours prn for nausea  3. Assured pt Paxil will start to work soon- a few days- will not start Benzo's due to d/w pt- he and I don't want him to get anything that could be addictive.

## 2021-03-06 NOTE — Progress Notes (Signed)
Inpatient Rehabilitation  Patient information reviewed and entered into eRehab system by Leelynn Whetsel Bertrice Leder, OTR/L.   Information including medical coding, functional ability and quality indicators will be reviewed and updated through discharge.    

## 2021-03-06 NOTE — Progress Notes (Signed)
PMR Admission Coordinator Pre-Admission Assessment   Patient: George [name]. is an 75 y.o., male MRN: [id number] DOB: [birth year] Height: $RemoveBeforeDE'6\' 1"'[id number]$  (185.4 cm) Weight: 93.1 kg   Insurance Information HMO:     PPO:      PCP:      IPA:      80/20:      OTHER:  PRIMARY: Medicare A/B      Policy#: [id number]      Subscriber: pt CM Name:       Phone#:      Fax#:  Pre-Cert#: verified Civil engineer, contracting:  Benefits:  Phone #:      Name:  Eff. Date: 10/10/2008 A/B     Deduct: $1556      Out of Pocket Max: n/a      Life Max: n/a CIR: 100%      SNF: 20 full days Outpatient: 80%     Co-Ins: 20% Home Health: 100%      Co-Pay:  DME: 80%     Co-Ins: 20% Providers:  SECONDARY: Generic Commercial      Policy#: [id number]     Phone#: [phone]   Financial Counselor:       Phone#:    The "Data Collection Information Summary" for patients in Inpatient Rehabilitation Facilities with attached "Privacy Act [place] Records" was provided and verbally reviewed with: Patient and Family   Emergency Contact Information Contact Information       Name Relation Home Work Mobile    Yaurel Spouse [phone]        Waheed,Queenie Daughter [phone]               Current Medical History  Patient Admitting Diagnosis: R CVA   History of Present Illness: George [name], George [name]. is a 75 year old right-handed male with history of colon cancer status postresection 2009 with chemotherapy, chemotherapy-induced peripheral neuropathy, prostate cancer with TURP 2016, anxiety/depression, hypertension, quit smoking 48 years ago, osteoarthritis bilateral knees status post TKA.   Presented 02/27/2021 med [place] [facility] with acute onset of left-sided weakness as well as headache .  CT angiogram head and neck showed subacute right PCA territory infarct with probable small foci of petechial hemorrhage.  Possible focal occlusion of P2 branch at the level of the ambient cistern.  Small remote infarct right  parietal lobe.  Patient did not receive tPA.  Admission chemistry unremarkable except creatinine 1.63-2.05, potassium 3.2, troponin 27, urinalysis negative nitrite.  Patient subsequently developed atrial fibrillation with RVR.  He was started on metoprolol as well as Cardizem drip.  Echocardiogram with ejection fraction of 60 to 65% no wall motion abnormalities.  Neurology follow-up maintained on Eliquis for CVA prophylaxis as well as atrial fibrillation.  AKI with creatinine 1.63 renal ultrasound negative for hydronephrosis initially maintained on IV fluids with latest creatinine 1.62.  He is tolerating a regular diet.  Therapy evaluations completed due to patient's left-sided weakness and decreased functional mobility was recommended for a comprehensive rehab program.   Complete NIHSS TOTAL: 6   Patient's medical record from [facility] has been reviewed by the rehabilitation admission coordinator and physician.   Past Medical History      Past Medical History:  Diagnosis Date   Anxiety     Arthritis      OSTEO IN KNEE   Asthma      as child   Colon cancer ([place])     Depression     Elevated prostate specific antigen (PSA)  Generalized anxiety disorder 04/09/2015   Hematospermia     Hx antineoplastic chemotherapy 2010   Hypertension     Idiopathic peripheral neuropathy      TOES OF BOTH FEET DUE TO CHEMO   Incomplete bladder emptying     Malignant neoplasm of prostate (HCC)     Nodular prostate with lower urinary tract symptoms     Panic attacks 04/09/2015   Primary localized osteoarthrosis of the knee, right 05/19/2016   Prostatitis     S/P total knee arthroplasty, left 05/19/2016   Spermatocele     Weak urinary stream        Has the patient had major surgery during 100 days prior to admission? No   Family History   family history includes Cancer in his brother; Stroke in his father.   Current Medications   Current Facility-Administered Medications:    acetaminophen  (TYLENOL) tablet 650 mg, 650 mg, Oral, Q4H PRN, 650 mg at 03/01/21 2012 **OR** acetaminophen (TYLENOL) 160 MG/5ML solution 650 mg, 650 mg, Per Tube, Q4H PRN **OR** acetaminophen (TYLENOL) suppository 650 mg, 650 mg, Rectal, Q4H PRN, Earnest Conroy, Neelima, MD   apixaban (ELIQUIS) tablet 5 mg, 5 mg, Oral, BID, Regalado, Belkys A, MD, 5 mg at 03/04/21 0830   atorvastatin (LIPITOR) tablet 80 mg, 80 mg, Oral, Daily, Kamineni, Neelima, MD, 80 mg at 03/04/21 0830   buPROPion (WELLBUTRIN XL) 24 hr tablet 450 mg, 450 mg, Oral, q morning, Kamineni, Neelima, MD, 450 mg at 03/04/21 1740   Chlorhexidine Gluconate Cloth 2 % PADS 6 each, 6 each, Topical, Daily, Regalado, Belkys A, MD, 6 each at 03/03/21 0859   cyanocobalamin ((VITAMIN B-12)) injection 1,000 mcg, 1,000 mcg, Intramuscular, Daily, Regalado, Belkys A, MD, 1,000 mcg at 03/04/21 0830   HYDROcodone-acetaminophen (NORCO/VICODIN) 5-325 MG per tablet 1 tablet, 1 tablet, Oral, Q6H PRN, Guilford Shi, MD, 1 tablet at 03/03/21 0955   hydrOXYzine (ATARAX/VISTARIL) tablet 10 mg, 10 mg, Oral, TID PRN, Guilford Shi, MD, 10 mg at 03/04/21 0829   metoprolol succinate (TOPROL-XL) 24 hr tablet 125 mg, 125 mg, Oral, Daily, Regalado, Belkys A, MD, 125 mg at 03/04/21 0829   metoprolol tartrate (LOPRESSOR) injection 2.5 mg, 2.5 mg, Intravenous, Q6H PRN, Regalado, Belkys A, MD, 2.5 mg at 03/03/21 1050   potassium chloride SA (KLOR-CON) CR tablet 40 mEq, 40 mEq, Oral, Q4H, Gonfa, Taye T, MD, 40 mEq at 03/04/21 0830   [START ON 03/07/2021] vitamin B-12 (CYANOCOBALAMIN) tablet 1,000 mcg, 1,000 mcg, Oral, Daily, Regalado, Belkys A, MD   Vitamin D (Ergocalciferol) (DRISDOL) capsule 50,000 Units, 50,000 Units, Oral, Q7 days, [place] Shi, MD, 50,000 Units at 02/27/21 2050   Patients Current Diet:  Diet Order                  Diet Heart Room service appropriate? Yes; Fluid consistency: Thin  Diet effective now                         Precautions /  Restrictions Precautions Precautions: Fall, Other (comment) Precaution Comments: L inattention Restrictions Weight Bearing Restrictions: No    Has the patient had 2 or more falls or a fall with injury in the past year? No   Prior Activity Level Community (5-7x/wk): independent prior to admit with no device, driving, managing all ADLs and iADLs   Prior Functional Level Self Care: Did the patient need help bathing, dressing, using the toilet or eating? Independent   Indoor Mobility: Did the  patient need assistance with walking from room to room (with or without device)? Independent   Stairs: Did the patient need assistance with internal or external stairs (with or without device)? Independent   Functional Cognition: Did the patient need help planning regular tasks such as shopping or remembering to take medications? Independent   Patient Information Are you of Hispanic, Latino/a,or Spanish origin?: A. No, not of Hispanic, Latino/a, or Spanish origin What is your race?: B. Black or African American Do you need or want an interpreter to communicate with a doctor or health care staff?: 0. No   Patient's Response To:  Health Literacy and Transportation Is the patient able to respond to health literacy and transportation needs?: Yes Health Literacy - How often do you need to have someone help you when you read instructions, pamphlets, or other written material from your doctor or pharmacy?: Never In the past 12 months, has lack of transportation kept you from medical appointments or from getting medications?: No In the past 12 months, has lack of transportation kept you from meetings, work, or from getting things needed for daily living?: No   Development worker, international aid / [place] Devices/Equipment: None Home Equipment: None   Prior Device Use: Indicate devices/aids used by the patient prior to current illness, exacerbation or injury? None of the above   Current Functional  Level Cognition   Arousal/Alertness: Awake/alert Overall Cognitive Status: Impaired/Different from baseline Current Attention Level: Sustained Orientation Level: Oriented X4 Following Commands: Follows one step commands with increased time Safety/Judgement: Decreased awareness of deficits General Comments: Pt with improved awareness of deficits stating that his L side is incoordinated and weak and he has trouble "paying attention" to the L side. Pt also admitted that he is at risk for falls. Memory: Appears intact (recalled 5/5 words after 2 minute delay) Problem Solving: Impaired Problem Solving Impairment: Verbal complex Executive Function: Self Monitoring Self Monitoring: Impaired Self Monitoring Impairment: Verbal complex Safety/Judgment: Appears intact    Extremity Assessment (includes Sensation/Coordination)   Upper Extremity Assessment: Defer to OT evaluation RUE Deficits / Details: strength 4/5, coordination deficits L > R RUE Coordination: decreased fine motor, decreased gross motor LUE Deficits / Details: strength 4/5, coordination deficits L > R LUE Sensation: decreased proprioception LUE Coordination: decreased fine motor, decreased gross motor  Lower Extremity Assessment: Defer to PT evaluation LLE Deficits / Details: 4+/5 strength grossly, ROM WFL. LLE Coordination: decreased gross motor     ADLs   Overall ADL's : Needs assistance/impaired Eating/Feeding: Minimal assistance, Sitting Grooming: Minimal assistance, Sitting Upper Body Bathing: Moderate assistance, Sitting Lower Body Bathing: Maximal assistance, +2 for physical assistance, +2 for safety/equipment, Sit to/from stand Upper Body Dressing : Moderate assistance, Sitting Lower Body Dressing: Maximal assistance, +2 for physical assistance, +2 for safety/equipment, Sit to/from stand Toilet Transfer: Maximal assistance, +2 for physical assistance, +2 for safety/equipment, Stand-pivot, BSC/3in1 Toilet Transfer  Details (indicate cue type and reason): Max A x 2 for pivot to/from [facility] with manual assist. On entry, pt nephew assisting pt to EOB for bathroom mobility - due to noted L sided deficits, encouraged use of BSC instead due to high fall risk Toileting- Clothing Manipulation and Hygiene: Maximal assistance, +2 for physical assistance, +2 for safety/equipment, Sit to/from stand Toileting - Clothing Manipulation Details (indicate cue type and reason): unable to void on BSC. +2 assist for clothing mgmt and balance General ADL Comments: pt delined ADLs this session. Spent attending to and locating and obtaining items to his left  Mobility   Overal bed mobility: Needs Assistance Bed Mobility: Supine to Sit Supine to sit: Supervision Sit to supine: Supervision General bed mobility comments: verbal cues for LLE and LUE as coming off the bed (leaving them behind)     Transfers   Overall transfer level: Needs assistance Equipment used: Rolling walker (2 wheels) Transfers: Sit to/from Stand Sit to Stand: Mod assist Bed to/from chair/wheelchair/BSC transfer type:: Step pivot Stand pivot transfers: Max assist, +2 physical assistance, +2 safety/equipment Step pivot transfers: Mod assist General transfer comment: dyscoordinated as trying to push off the bed and imbalance requiring mod assist to maintain balance and get LUE on RW     Ambulation / Gait / Stairs / Wheelchair Mobility   Ambulation/Gait Ambulation/Gait assistance: Herbalist (Feet): 30 Feet Assistive device: Rolling walker (2 wheels) Gait Pattern/deviations: Step-to pattern, Ataxic General Gait Details: pt with slowed step-to gait, reduced step length bilaterally, pt with ataxia of LLE Gait velocity: reduced Gait velocity interpretation: <1.31 ft/sec, indicative of household ambulator     Posture / Balance Dynamic Sitting Balance Sitting balance - Comments: reliant on UE support, otherwise posterior lean Balance Overall  balance assessment: Needs assistance Sitting-balance support: Feet supported Sitting balance-Leahy Scale: Fair Sitting balance - Comments: reliant on UE support, otherwise posterior lean Postural control: Posterior lean Standing balance support: Bilateral upper extremity supported Standing balance-Leahy Scale: Poor Standing balance comment: minA with UE support of PT     Special needs/care consideration N/a    Previous Home Environment (from acute therapy documentation) Living Arrangements: Spouse/significant other  Lives With: Spouse Available Help at Discharge: Family, Available 24 hours/day Type of Home: House Home Layout: One level Home Access: Level entry Bathroom Shower/Tub: Multimedia programmer: Standard Bathroom Accessibility: Yes How Accessible: Accessible via wheelchair Home Care Services: No   Discharge Living Setting Plans for Discharge Living Setting: Patient's home, Lives with (comment) (spouse) Type of Home at Discharge: House Discharge Home Layout: One level Discharge Home Access: Level entry Discharge Bathroom Shower/Tub: Walk-in shower Discharge Bathroom Toilet: Standard Discharge Bathroom Accessibility: Yes How Accessible: Accessible via wheelchair Does the patient have any problems obtaining your medications?: No   Social/Family/Support Systems Patient Roles: Spouse Anticipated Caregiver: spouse Denice Paradise) and daughter Gwendel Hanson) Anticipated Caregiver's Contact Information: Denice Paradise [phone] Ability/Limitations of Caregiver: n/a Caregiver Availability: 24/7 Discharge Plan Discussed with Primary Caregiver: Yes Is Caregiver In Agreement with Plan?: Yes Does Caregiver/Family have Issues with Lodging/Transportation while Pt is in Rehab?: No   Goals Patient/Family Goal for Rehab: PT/OT supervision to mod I, SLP supervision Expected length of stay: 13-15 days Pt/Family Agrees to Admission and willing to participate: Yes Program Orientation Provided &  Reviewed with Pt/Caregiver Including Roles  & Responsibilities: Yes   Decrease burden of Care through IP rehab admission: n/a   Possible need for SNF placement upon discharge: no   Patient Condition: I have reviewed medical records from [facility], spoken with CM, and patient, spouse, and daughter. I met with patient at the bedside for inpatient rehabilitation assessment.  Patient will benefit from ongoing PT, OT, and SLP, can actively participate in 3 hours of therapy a day 5 days of the week, and can make measurable gains during the admission.  Patient will also benefit from the coordinated team approach during an Inpatient Acute Rehabilitation admission.  The patient will receive intensive therapy as well as Rehabilitation physician, nursing, social worker, and care management interventions.  Due to safety, skin/wound care, disease management, medication administration, pain management,  and patient education the patient requires 24 hour a day rehabilitation nursing.  The patient is currently mod to max assist +2 with mobility and basic ADLs.  Discharge setting and therapy post discharge at home with home health is anticipated.  Patient has agreed to participate in the Acute Inpatient Rehabilitation Program and will admit tomorrow.   Preadmission Screen Completed By:  Michel Santee, PT, DPT 03/04/2021 10:08 AM ______________________________________________________________________   Discussed status with Dr. Dagoberto Ligas on 03/04/21  at 10:08 AM  and received approval for admission 11/24.    Admission Coordinator:  Michel Santee, PT, DPT time 10:08 AM Sudie Grumbling 03/04/21     Assessment/Plan: Diagnosis: Does the need for close, 24 hr/day Medical supervision in concert with the patient's rehab needs make it unreasonable for this patient to be served in a less intensive setting? Yes Co-Morbidities requiring supervision/potential complications: Afib with RVR, R PCA stroke with L hemi and L hemianopsia;  anxiety/depression; chemo based neuropathy Due to bladder management, bowel management, safety, skin/wound care, disease management, medication administration, pain management, and patient education, does the patient require 24 hr/day rehab nursing? Yes Does the patient require coordinated care of a physician, rehab nurse, PT, OT, and SLP to address physical and functional deficits in the context of the above medical diagnosis(es)? Yes Addressing deficits in the following areas: balance, endurance, locomotion, strength, transferring, bowel/bladder control, bathing, dressing, feeding, grooming, toileting, cognition, and speech Can the patient actively participate in an intensive therapy program of at least 3 hrs of therapy 5 days a week? Yes The potential for patient to make measurable gains while on inpatient rehab is good Anticipated functional outcomes upon discharge from inpatient rehab: modified independent and supervision PT, modified independent and supervision OT, supervision SLP Estimated rehab length of stay to reach the above functional goals is: 13-15 days Anticipated discharge destination: Home 10. Overall Rehab/Functional Prognosis: good     MD Signature:           Revision History                                    Note Details  Jan Fireman, MD File Time 03/05/2021  7:42 AM  Author Type Physician Status Signed  Last Editor Courtney Heys, MD Service Physical Medicine and [place] # [id number] Admit Date 03/05/2021

## 2021-03-06 NOTE — Progress Notes (Signed)
[facility] Individual Statement of Services  Patient Name:  Strother Everitt.  Date:  03/06/2021  Welcome to the [facility].  Our goal is to provide you with an individualized program based on your diagnosis and situation, designed to meet your specific needs.  With this comprehensive rehabilitation program, you will be expected to participate in at least 3 hours of rehabilitation therapies Monday-Friday, with modified therapy programming on the weekends.  Your rehabilitation program will include the following services:  Physical Therapy (PT), Occupational Therapy (OT), Speech Therapy (ST), 24 hour per day rehabilitation nursing, Neuropsychology, Care Coordinator, Rehabilitation Medicine, Nutrition Services, and Pharmacy Services  Weekly team conferences will be held on Wednesday to discuss your progress.  Your Inpatient Rehabilitation Care Coordinator will talk with you frequently to get your input and to update you on team discussions.  Team conferences with you and your family in attendance may also be held.  Expected length of stay: 10-12 days  Overall anticipated outcome: supervision wit cueing  Depending on your progress and recovery, your program may change. Your Inpatient Rehabilitation Care Coordinator will coordinate services and will keep you informed of any changes. Your Inpatient Rehabilitation Care Coordinator's name and contact numbers are listed  below.  The following services may also be recommended but are not provided by the [facility] will be made to provide these services after discharge if needed.  Arrangements include referral to agencies that provide these services.  Your insurance has been verified to be: [employer]  Your primary doctor is:  Charolette Forward  Pertinent  information will be shared with your doctor and your insurance company.  Inpatient Rehabilitation Care Coordinator:  Ovidio Kin, [place] or Emilia Beck  Information discussed with and copy given to patient by: [name], 03/06/2021, 11:41 AM

## 2021-03-06 NOTE — Plan of Care (Signed)
  Problem: RH Balance Goal: LTG Patient will maintain dynamic standing balance (PT) Description: LTG:  Patient will maintain dynamic standing balance with assistance during mobility activities (PT) Flowsheets (Taken 03/06/2021 1205) LTG: Pt will maintain dynamic standing balance during mobility activities with:: Contact Guard/Touching assist   Problem: Sit to Stand Goal: LTG:  Patient will perform sit to stand with assistance level (PT) Description: LTG:  Patient will perform sit to stand with assistance level (PT) Flowsheets (Taken 03/06/2021 1205) LTG: PT will perform sit to stand in preparation for functional mobility with assistance level: Independent with assistive device   Problem: RH Bed Mobility Goal: LTG Patient will perform bed mobility with assist (PT) Description: LTG: Patient will perform bed mobility with assistance, with/without cues (PT). Flowsheets (Taken 03/06/2021 1205) LTG: Pt will perform bed mobility with assistance level of: Independent   Problem: RH Bed to Chair Transfers Goal: LTG Patient will perform bed/chair transfers w/assist (PT) Description: LTG: Patient will perform bed to chair transfers with assistance (PT). Flowsheets (Taken 03/06/2021 1205) LTG: Pt will perform Bed to Chair Transfers with assistance level: Supervision/Verbal cueing   Problem: RH Car Transfers Goal: LTG Patient will perform car transfers with assist (PT) Description: LTG: Patient will perform car transfers with assistance (PT). Flowsheets (Taken 03/06/2021 1205) LTG: Pt will perform car transfers with assist:: Supervision/Verbal cueing   Problem: RH Ambulation Goal: LTG Patient will ambulate in controlled environment (PT) Description: LTG: Patient will ambulate in a controlled environment, # of feet with assistance (PT). Flowsheets (Taken 03/06/2021 1205) LTG: Pt will ambulate in controlled environ  assist needed:: Supervision/Verbal cueing LTG: Ambulation distance in controlled  environment: 166ft Goal: LTG Patient will ambulate in home environment (PT) Description: LTG: Patient will ambulate in home environment, # of feet with assistance (PT). Flowsheets (Taken 03/06/2021 1205) LTG: Pt will ambulate in home environ  assist needed:: Supervision/Verbal cueing LTG: Ambulation distance in home environment: 6ft   Problem: RH Stairs Goal: LTG Patient will ambulate up and down stairs w/assist (PT) Description: LTG: Patient will ambulate up and down # of stairs with assistance (PT) Flowsheets (Taken 03/06/2021 1205) LTG: Pt will ambulate up/down stairs assist needed:: Contact Guard/Touching assist LTG: Pt will  ambulate up and down number of stairs: 4

## 2021-03-07 DIAGNOSIS — I639 Cerebral infarction, unspecified: Secondary | ICD-10-CM | POA: Diagnosis not present

## 2021-03-07 NOTE — Progress Notes (Signed)
PROGRESS NOTE   Subjective/Complaints: No new complaints Has had nosebleeds since the 1970 but have worsened since CVA. Discussed getting outpatient ENT referral and he is agreeable  ROS: +epistaxis   Objective:   No results found. Recent Labs    03/06/21 0502  WBC 9.3  HGB 9.8*  HCT 30.6*  PLT 304   Recent Labs    03/05/21 0144 03/06/21 0502  NA 139 136  K 4.0 3.5  CL 112* 109  CO2 18* 21*  GLUCOSE 84 93  BUN 17 18  CREATININE 2.03* 1.91*  CALCIUM 8.7* 8.6*    Intake/Output Summary (Last 24 hours) at 03/07/2021 1822 Last data filed at 03/07/2021 1402 Gross per 24 hour  Intake 240 ml  Output 125 ml  Net 115 ml        Physical Exam: Vital Signs Blood pressure 135/80, pulse (!) 104, temperature 98 F (36.7 C), resp. rate 16, SpO2 99 %. Gen: no distress, normal appearing HEENT: oral mucosa pink and moist, NCAT Cardio:Tachycardia Chest: normal effort, normal rate of breathing Abdominal:     Comments: Soft, NT, ND, (+)BS -normoactive  Musculoskeletal:     Cervical back: Normal range of motion. No rigidity.     Comments: R side 5/5 in RUE/RLE LUE- 4+/5 in Biceps, triceps, WE, grip and FA LLE- 5-/5 in HF/KE/DF and PF  Skin:    General: Skin is warm and dry.  Neurological:     Mental Status: He is alert.     Comments: Patient is alert.  Makes eye contact with examiner with noted left hemianopsia.  Follows simple commands.  Provides name and age some delay in processing. Intact to light touch in all 4 extremities Finger to nose searching pattern on L side Coordination impaired on L side- touching all fingers with thumb L pronator drift- mild to moderate  Psychiatric:     Comments: Slightly anxious c/o panic attacks    Assessment/Plan: 1. Functional deficits which require 3+ hours per day of interdisciplinary therapy in a comprehensive inpatient rehab setting. Physiatrist is providing close team  supervision and 24 hour management of active medical problems listed below. Physiatrist and rehab team continue to assess barriers to discharge/monitor patient progress toward functional and medical goals  Care Tool:  Bathing    Body parts bathed by patient: Right arm, Left arm, Chest, Abdomen, Front perineal area, Buttocks, Right upper leg, Left upper leg, Right lower leg, Left lower leg, Face         Bathing assist Assist Level: Minimal Assistance - Patient > 75%     Upper Body Dressing/Undressing Upper body dressing   What is the patient wearing?: Pull over shirt    Upper body assist Assist Level: Supervision/Verbal cueing    Lower Body Dressing/Undressing Lower body dressing      What is the patient wearing?: Pants, Underwear/pull up     Lower body assist Assist for lower body dressing: Minimal Assistance - Patient > 75%     Toileting Toileting Toileting Activity did not occur (Clothing management and hygiene only): N/A (no void or bm)  Toileting assist Assist for toileting: Contact Guard/Touching assist Assistive Device Comment: walker  Transfers Chair/bed transfer  Transfers assist     Chair/bed transfer assist level: Minimal Assistance - Patient > 75% Chair/bed transfer assistive device: Museum/gallery exhibitions officer assist      Assist level: Minimal Assistance - Patient > 75% Assistive device: Walker-rolling Max distance: 251ft   Walk 10 feet activity   Assist     Assist level: Moderate Assistance - Patient - 50 - 74% Assistive device: No Device   Walk 50 feet activity   Assist    Assist level: Moderate Assistance - Patient - 50 - 74% Assistive device: No Device    Walk 150 feet activity   Assist Walk 150 feet activity did not occur: Safety/medical concerns         Walk 10 feet on uneven surface  activity   Assist Walk 10 feet on uneven surfaces activity did not occur: Safety/medical concerns          Wheelchair     Assist Is the patient using a wheelchair?: No   Wheelchair activity did not occur: N/A         Wheelchair 50 feet with 2 turns activity    Assist    Wheelchair 50 feet with 2 turns activity did not occur: N/A       Wheelchair 150 feet activity     Assist  Wheelchair 150 feet activity did not occur: N/A       Blood pressure 135/80, pulse (!) 104, temperature 98 F (36.7 C), resp. rate 16, SpO2 99 %.   Medical Problem List and Plan: 1.  Left-sided weakness functional deficits secondary to subacute right PCA infarct secondary new onset atrial fibrillation             -patient may  shower             -ELOS/Goals: 13-15 days supervision  Continue CIR 2.  Antithrombotics: -DVT/anticoagulation:  Pharmaceutical: Other (comment) Eliquis             -antiplatelet therapy: N/A 3. Pain Management: Hydrocodone as needed 4. Panic attacks: Wellbutrin 450 mg daily             -will add Paxil 20 mg QHS for panic attacks, since can mix with Wellbutrin- went over with pt. Buspar TID PRN added.              -antipsychotic agents: N/A 5. Neuropsych: This patient is capable of making decisions on his own behalf. 6. Skin/Wound Care: Routine skin checks 7. Fluids/Electrolytes/Nutrition: Routine in and outs with follow-up chemistries 8.  Permissive hypertension.  Presently on Toprol-XL 125 mg daily.  Monitor with increased mobility.  Patient also on losartan 100 mg daily, Aldactone 25 mg daily, HCTZ 25 mg daily prior to admission.  Resume as needed 9.  Hyperlipidemia.  Lipitor 10.  AKI.  Renal ultrasound negative.  Follow-up chemistries             Cr 2.0- will push fluids and recheck in AM 11.  New onset atrial fibrillation.  Continue Eliquis.  Cardiac rate controlled 12.  History of colon cancer with resection in 2009/chemotherapy.  Follow-up outpatient 13.  History of prostate cancer TURP 2016.  Follow-up outpatient 14. Nausea: Compazine 10mg  q6H prn  added 15. Chronic epistaxis: outpatient referral to ENT  LOS: 2 days A FACE TO FACE EVALUATION WAS PERFORMED  Clide Deutscher Artin Mceuen 03/07/2021, 6:22 PM

## 2021-03-07 NOTE — Progress Notes (Signed)
Occupational Therapy Session Note  Patient Details  Name: George [name]. MRN: [id number] Date of Birth: 1946-03-09  Today's Date: 03/07/2021 OT Individual Time: 1400-1500 OT Individual Time Calculation (min): 60 min    Short Term Goals: Week 1:  OT Short Term Goal 1 (Week 1): STGs=LTGs due to ELOS  Skilled Therapeutic Interventions/Progress Updates:    Pt received in bed with spouse present and agreeable to therapy. Pt seen this session to focus on LU/LLE coordination and balance along with L visual scanning.   Pt used RW to ambulated to orthogym but had difficulty either clearing L foot or overstepping foot so spent time working on technique of rolling through foot to ball of foot, prior to advancing in forward.  Pt did improve with this.  During ambulation, cued pt to fully turn head to L to scan environment as he had to negotiate through doorways.  Mod A at times with RW navigation as he made turns.  He had difficulty coordinating movement of RW with his stepping pattern.  On mat in gym, pt worked on [place] hoop and turning fully side to side like a steering wheel and overhead reaches. Sit to stands focusing on controlled descent.  Forced use of LLE with foot on top of 3 inch box and pushing from bent knee to straight leg with hands on RW for support.   Pt moved to supine on mat to work on closed chain exercises for sensory feedback and coordination with bridges, ball squeezes between knees and resisted knee extensions. He sat back up to edge of mat, rested briefly and then worked on ambulating back to room with RW with min A with improved gait control.  Cues for visual scanning and problem solving with side stepping to fit his RW into a small space. Pt resting in  bed with all needs met.   Therapy Documentation Precautions:  Precautions Precautions: Fall Precaution Comments: L inattention Restrictions Weight Bearing Restrictions: No    Vital  Signs: Therapy Vitals Temp: 99.7 F (37.6 C) Temp Source: Oral Pulse Rate: 60 Resp: 16 BP: (!) 152/93 Patient Position (if appropriate): Lying Oxygen Therapy SpO2: 97 % O2 Device: Room Air Pain:   ADL: ADL Eating: Not assessed Grooming: Minimal assistance Where Assessed-Grooming: Edge of bed Upper Body Bathing: Supervision/safety Where Assessed-Upper Body Bathing: Edge of bed Lower Body Bathing: Minimal assistance Where Assessed-Lower Body Bathing: Edge of bed Upper Body Dressing: Supervision/safety Where Assessed-Upper Body Dressing: Edge of bed Lower Body Dressing: Minimal assistance Where Assessed-Lower Body Dressing: Edge of bed Toileting: Not assessed Toilet Transfer: Not assessed   Therapy/Group: Individual Therapy  [place] 03/07/2021, 7:55 AM

## 2021-03-07 NOTE — Progress Notes (Signed)
Physical Therapy Session Note  Patient Details  Name: George [name]. MRN: [id number] Date of Birth: July 15, 1945  Today's Date: 03/07/2021 PT Individual Time: 0910-0959 and 4098-1191 PT Individual Time Calculation (min): 49 min and 53 min  Short Term Goals: Week 1:  PT Short Term Goal 1 (Week 1): Pt will complete bed mobility mod I PT Short Term Goal 2 (Week 1): Pt will complete bed<>chair transfers with CGA and LRAD PT Short Term Goal 3 (Week 1): Pt will ambulate 151ft with CGA and LRAD PT Short Term Goal 4 (Week 1): Pt will initiate stair training  Skilled Therapeutic Interventions/Progress Updates:    Session 1: Pt received supine in bed and agreeable to therapy session. Pt impulsively starts to get to EOB prior to therapist having time to lower the bedrail despite therapist requesting time to set-up environment, requires verbal cues for safety. Supine>sit, HOB partially elevated and using bedrail, supervision. Sitting EOB, donned tennis shoes with assist to tie them as pt unable to motor plan this task despite attempts and demo due to UE ataxia and impaired sensation. Sit>stand EOB>RW with min assist for balance. Gait training ~172ft to main therapy gym using RW with light min assist for balance - demos increased postural sway, decreased L LE foot clearance during swing causing intermittent short step lengths as well as overall impaired L LE coordination - pt perseverating on the fact that the RW wheels are not moving correctly but therapist checked and they are moving as expected.  Pt later states he feels like he was about to have a "panic attack" right before my arrival stating if he doesn't get his "wellbutrin" in the morning then it makes his personality different and he feels like he is going to have a panic attack. Notified nurse and therapist provided emotional support.  Gait training ~33ft, no AD, including several 90degree turns in both directions and a couple 180degree turns with  min assist and ~2-3x light mod assist for balance recovery due to pt cross stepping after moderate LOB requiring stepping strategy to recover balance -  continues to have increased overall postural sway, decreased L LE step length, and slow gait speed.  Stair navigation ascending/descending 4x3 6" height steps using B HRs - ascending/descending reciprocally for L LE NMR - requires max cuing to ensure L foot placed fully onto step when ascending as pt almost missing - heavy min assist for balance with 1x minor L knee instability on descent but otherwise no concerns of buckling.  Throughout session pt tends to have L hemibody and hemi-environment inattention requiring cuing for L foot or hand placement during mobility tasks.   Gait training ~140ft back to room using RW with light min assist for balance as described above - pt continues to require max cuing to turn head L and visually scan to locate hallway and doorway into his room. Pt left sitting in recliner with needs in reach and seat belt alarm on.    Session 2: Pt received supine in bed and agreeable to therapy session despite reporting he had a panic attach ~2-3hrs prior to therapist arrival. Supine>sitting L EOB, HOB slightly elevated, with supervision for safety - pt less impulsive this afternoon. Sitting EOB, donned tennis shoes with mod assist for sliding them on and then total assist for tying despite multiple attempts and visual demonstrations due to significant UE ataxia and impaired sensation. Sit<>stands using RW with light min assist for balance during session. Gait training ~154ft to main therapy gym  using RW with min assist for balance - increased postural sway, decreased L LE foot clearance with toe catching (appears to have some L hemibody inattention with delayed initiation of L swing and decreased response to "correct" L toe catching despite it causing slight anterior LOB), L knee snapping back into hyperextension during mid to  terminal stance ~50% of the time, and decreased gait speed.  Donned maxisky harness. Gait training in maxisky 2ft 2x, no UE support, with light min assist/CGA for balance - demos improved L LE foot clearance and step length without UE support compared to when using RW likely due to forced weight shifting - continues to have truncal ataxia with increased postural sway and slow gait speed. Doffed maxi-sky harness.  Gait training ~13ft back to room using RW with light min assist - continued cuing as above in addition to providing tactile cuing at L hip to initiate increased foot clearance and step length during swing phase as well as tactile cuing to avoid L knee hyperextension during stance. Continues to require max verbal cuing to turn head and visually scan to L to find his room. Pt left seated in recliner with needs in reach, seat belt alarm on, and his wife arriving upon therapist exit.  Therapy Documentation Precautions:  Precautions Precautions: Fall Precaution Comments: L inattention Restrictions Weight Bearing Restrictions: No   Pain:  Session 1: Denies pain during session.  Session 2: Denies pain during session.   Therapy/Group: Individual Therapy  Tawana Scale , PT, DPT, NCS, CSRS  03/07/2021, 7:54 AM

## 2021-03-08 NOTE — IPOC Note (Signed)
Overall Plan of Care (IPOC) Patient Details Name: George [name]. MRN: [id number] DOB: 1945-06-03  Admitting Diagnosis: Subcortical infarction [facility])  Hospital Problems: Principal Problem:   Subcortical infarction [facility])     Functional Problem List: Nursing Bladder, Endurance, Medication Management, Safety, Pain  PT Balance, Endurance, Motor, Pain, Safety, Sensory  OT Balance, Vision, Cognition, Endurance, Motor, Safety, Sensory, Perception, Skin Integrity  SLP Cognition, Perception, Safety  TR         Basic ADL's: OT Grooming, Bathing, Dressing, Toileting     Advanced  ADL's: OT Simple Meal Preparation     Transfers: PT Bed Mobility, Bed to Chair, Car  OT Toilet, Tub/Shower     Locomotion: PT Ambulation, Stairs     Additional Impairments: OT Fuctional Use of Upper Extremity  SLP None      TR      Anticipated Outcomes Item Anticipated Outcome  Self Feeding No goal  Swallowing  N/A   Basic self-care  Supervision  Toileting  Supervision   Bathroom Transfers Supervision  Bowel/Bladder  manage bladder w toileting routine  Transfers  supervision  Locomotion  supervision  Communication  N/A  Cognition  mod I cognition  Pain  pain at or below level 4  w prns  Safety/Judgment  maintain safety w cuesreminders   Therapy Plan: PT Intensity: Minimum of 1-2 x/day ,45 to 90 minutes PT Frequency: 5 out of 7 days PT Duration Estimated Length of Stay: 10-14 days OT Intensity: Minimum of 1-2 x/day, 45 to 90 minutes OT Frequency: 5 out of 7 days OT Duration/Estimated Length of Stay: 7-10 days SLP Intensity: Minumum of 1-2 x/day, 30 to 90 minutes SLP Frequency: 3 to 5 out of 7 days SLP Duration/Estimated Length of Stay: 2 weeks   Due to the current state of emergency, patients may not be receiving their 3-hours of Medicare-mandated therapy.   Team Interventions: Nursing Interventions Bladder Management, Disease Management/Prevention, Medication Management,  Discharge Planning, Patient/Family Education, Pain Management  PT interventions Ambulation/gait training, Training and development officer, Cognitive remediation/compensation, Community reintegration, Discharge planning, Disease management/prevention, DME/adaptive equipment instruction, Functional electrical stimulation, Functional mobility training, Neuromuscular re-education, Pain management, Patient/family education, Psychosocial support, Skin care/wound management, Splinting/orthotics, Stair training, Therapeutic Activities, Therapeutic Exercise, UE/LE Strength taining/ROM, UE/LE Coordination activities, Visual/perceptual remediation/compensation, Wheelchair propulsion/positioning  OT Interventions Balance/vestibular training, Disease mangement/prevention, Neuromuscular re-education, Self Care/advanced ADL retraining, Therapeutic Exercise, UE/LE Strength taining/ROM, Pain management, DME/adaptive equipment instruction, Cognitive remediation/compensation, Community reintegration, Barrister's clerk education, UE/LE Coordination activities, Visual/perceptual remediation/compensation, Therapeutic Activities, Psychosocial support, Functional mobility training, Discharge planning  SLP Interventions Cognitive remediation/compensation, Environmental controls, Functional tasks, Internal/external aids, Medication managment, Patient/family education  TR Interventions    SW/CM Interventions Discharge Planning, Psychosocial Support, Patient/Family Education   Barriers to Discharge MD  Medical stability  Nursing Decreased caregiver support 1 level home 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)  PT Insurance for SNF coverage Ataxia, balance deficits, high falls risk  OT Other (comments) n/a, pts home is accessible and he will have needed assistance from spouse  SLP      SW       Team Discharge Planning: Destination: PT-Home ,OT- Home , SLP-Home Projected Follow-up: PT-Outpatient PT, 24 hour supervision/assistance, OT-  Home health OT,  SLP-Other (comment) (TBD pending progress) Projected Equipment Needs: PT-To be determined, OT- To be determined, SLP-None recommended by SLP Equipment Details: PT-Pt owns no DME, OT-  Patient/family involved in discharge planning: PT- Patient, Family member/caregiver,  OT-Patient, Family member/caregiver, SLP-Patient  MD ELOS: 13-15 days S Medical Rehab Prognosis:  Excellent  Assessment: George [name] is a 75 year old man admitted to CIR with left-sided weakness functional deficits secondary to subacute right PCA infarct secondary new onset atrial fibrillation. Medications are being managed, and labs and vitals are being monitored regularly.     See Team Conference Notes for weekly updates to the plan of care

## 2021-03-09 DIAGNOSIS — I639 Cerebral infarction, unspecified: Secondary | ICD-10-CM | POA: Diagnosis not present

## 2021-03-09 LAB — BASIC METABOLIC PANEL
Anion gap: 6 (ref 5–15)
BUN: 15 mg/dL (ref 8–23)
CO2: 22 mmol/L (ref 22–32)
Calcium: 8.7 mg/dL — ABNORMAL LOW (ref 8.9–10.3)
Chloride: 110 mmol/L (ref 98–111)
Creatinine, Ser: 1.78 mg/dL — ABNORMAL HIGH (ref 0.61–1.24)
GFR, Estimated: 39 mL/min — ABNORMAL LOW (ref >60)
Glucose, Bld: 90 mg/dL (ref 70–99)
Potassium: 3.1 mmol/L — ABNORMAL LOW (ref 3.5–5.1)
Sodium: 138 mmol/L (ref 135–145)

## 2021-03-09 LAB — CBC
HCT: 32.6 % — ABNORMAL LOW (ref 39.0–52.0)
Hemoglobin: 10.5 g/dL — ABNORMAL LOW (ref 13.0–17.0)
MCH: 28.7 pg (ref 26.0–34.0)
MCHC: 32.2 g/dL (ref 30.0–36.0)
MCV: 89.1 fL (ref 80.0–100.0)
Platelets: 378 10*3/uL (ref 150–400)
RBC: 3.66 MIL/uL — ABNORMAL LOW (ref 4.22–5.81)
RDW: 13.5 % (ref 11.5–15.5)
WBC: 9.3 10*3/uL (ref 4.0–10.5)
nRBC: 0 % (ref 0.0–0.2)

## 2021-03-09 MED ORDER — ENSURE ENLIVE PO LIQD
237.0000 mL | Freq: Three times a day (TID) | ORAL | Status: DC
  Administered 2021-03-09 – 2021-03-13 (×12): 237 mL via ORAL

## 2021-03-09 MED ORDER — ADULT MULTIVITAMIN W/MINERALS CH
1.0000 | ORAL_TABLET | Freq: Every day | ORAL | Status: DC
  Administered 2021-03-09 – 2021-03-14 (×6): 1 via ORAL
  Filled 2021-03-09 (×6): qty 1

## 2021-03-09 MED ORDER — SPIRONOLACTONE 25 MG PO TABS
25.0000 mg | ORAL_TABLET | Freq: Every day | ORAL | Status: DC
  Administered 2021-03-10 – 2021-03-14 (×5): 25 mg via ORAL
  Filled 2021-03-09 (×5): qty 1

## 2021-03-09 MED ORDER — POTASSIUM CHLORIDE CRYS ER 20 MEQ PO TBCR
40.0000 meq | EXTENDED_RELEASE_TABLET | Freq: Every day | ORAL | Status: AC
  Administered 2021-03-09: 13:00:00 40 meq via ORAL
  Filled 2021-03-09: qty 2

## 2021-03-09 NOTE — Progress Notes (Signed)
Patient ID: George Barstow., male   DOB: [birth year], 75 y.o.   MRN: [id number] Met with the patient and his wife to introduce self and review role. Patient resting; wife noted diarrhea for several days with hx of colon issues. Patient with poor appetite and afraid to eat due to diarrhea. Jerking movements noted (post paxil) and wife noted understanding of time to get out of the system. Continue to follow along to discharge to address educational needs. Reviewed handouts on dietary modifications and management of secondary risks. Collaborate with the team to facilitate preparation for and smooth discharge home. George [name]

## 2021-03-09 NOTE — Progress Notes (Signed)
Initial Nutrition Assessment  DOCUMENTATION CODES:  Not applicable  INTERVENTION:  Add Ensure Plus High Protein po TID, each supplement provides 350 kcal and 20 grams of protein.   Add MVI with minerals daily.  NUTRITION DIAGNOSIS:  Increased nutrient needs related to acute illness (rehab) as evidenced by estimated needs.  GOAL:  Patient will meet greater than or equal to 90% of their needs  MONITOR: PO intake, Supplement acceptance, Labs, Weight trends, I & O's  REASON FOR ASSESSMENT:  Consult Diet education  ASSESSMENT:  75 yo male with a PMH of bowel resection in 2006, anxiety, depression, HTN, prostate cancer, colon cancer, idiopathic peripheral neuropathy, GAD, panic attacks, arthritis, and asthma who presents to rehab after subcortical infarction.  Spoke with pt at bedside. Pt with limited PO intake. Pt afraid to eat due to diarrhea. Pt also eating poorly PTA due to decreased appetite.  Pt with an average intake of 25% over the past 8 meals per Epic documentation.  Pt is unsure of any changes in his weight at this time. Per Epic, pt's weight increased over the past 3 months.  Pt with no admission weight to rehab. RD to order.  Spoke with pt on methods to combat diarrhea. Gave "Diarrhea Nutrition Therapy" and "Low Fiber (8 mg daily) Nutrition Therapy" handouts to patient at bedside.  Recommend adding Ensure TID and MVI with minerals daily.  Medications: reviewed; spironolactone, Vitamin B12, Vitamin D 50000 units weekly  Labs: reviewed; K 3.1 (L)  NUTRITION - FOCUSED PHYSICAL EXAM: Flowsheet Row Most Recent Value  Orbital Region Mild depletion  Upper Arm Region No depletion  Thoracic and Lumbar Region No depletion  Buccal Region Mild depletion  Temple Region Mild depletion  Clavicle Bone Region No depletion  Clavicle and Acromion Bone Region No depletion  Scapular Bone Region No depletion  Dorsal Hand No depletion  Patellar Region No depletion  Anterior  Thigh Region No depletion  Posterior Calf Region No depletion  Edema (RD Assessment) None  Hair Reviewed  Eyes Reviewed  Mouth Reviewed  Skin Reviewed  Nails Reviewed   Diet Order:   Diet Order             Diet Heart Room service appropriate? Yes; Fluid consistency: Thin  Diet effective now                  EDUCATION NEEDS:  Education needs have been addressed  Skin:  Skin Assessment: Reviewed RN Assessment  Last BM:  03/08/21 - x3, Type 7  Height:  Ht Readings from Last 1 Encounters:  02/27/21 6\' 1"  (1.854 m)   Weight:  Wt Readings from Last 1 Encounters:  03/01/21 93.1 kg   BMI:  There is no height or weight on file to calculate BMI.  Estimated Nutritional Needs:  Kcal:  2000-2200 Protein:  110-125 grams Fluid:  >2 L  Derrel Nip, RD, LDN (she/her/hers) Clinical Inpatient Dietitian RD Pager/After-Hours/Weekend Pager # in [place]

## 2021-03-09 NOTE — Progress Notes (Signed)
Speech Language Pathology Daily Session Note  Patient Details  Name: George [name]. MRN: [id number] Date of Birth: 11-06-1945  Today's Date: 03/09/2021 SLP Individual Time: 1517-1600 SLP Individual Time Calculation (min): 43 min  Short Term Goals: Week 1: SLP Short Term Goal 1 (Week 1): Patient will orient self to time via use of calendar and clock with min-modA verbal, visual cues to compensate for visual deficit. SLP Short Term Goal 2 (Week 1): Patient will perform complex level problem solving tasks (medication management, money management) with minA verbal and visual cues for accuracy. SLP Short Term Goal 3 (Week 1): Patient will demonstrate effective use of learned strategies to compensate for his physical and visual deficits during function tasks, simulated ADL's with minA verbal and visual cues. SLP Short Term Goal 4 (Week 1): Patient will recall and describe specific daily medical and therapeutic interventions with minA verbal cues for accuracy and use of memory aides as needed. SLP Short Term Goal 5 (Week 1): Patient will demonstrate adequate awareness to errors during functional task performance with min-modA verbal, visual cues.  Skilled Therapeutic Interventions: Pt seen for skilled ST with focus on cognitive goals, wife present throughout. Pt participating in money counting exercise benefiting from min A verbal cues for error awareness (visual impairments likely impacting ability to decipher between coins). Pt completing functional reading task with Supervision A cues for L scan and problem solving. Pt completing simple math problems from ALFA subtest with 60% accuracy independent, min-mod verbal cues to increase accuracy to 100%. Pt mildly discouraged with performance, educated on effects of CVA and visual changes at this time. Pt states he is frustrated he is unable to use cell phone like he used to, told him we would work on it to increase independence and reduce frustration. Pt  also interested in seeing neuropsych during stay, says he is feeling the way he did in the early 2000s when he had "panic and anxiety attacks for months". Will follow up. Pt left in bed with alarm set and wife present for needs. Cont ST POC.   Pain Pain Assessment Pain Scale: 0-10 Pain Score: 0-No pain  Therapy/Group: Individual Therapy  Dewaine Conger 03/09/2021, 4:07 PM

## 2021-03-09 NOTE — Progress Notes (Addendum)
PROGRESS NOTE   Subjective/Complaints: Hx of chronic diarrhea since partial colectomy due to Colon Ca 2009 Labs reviewed and compared to prior  Still figiting, wife asking about how long paxil effects may linger  ROS: -epistaxis., + diarrhea    Objective:   No results found. Recent Labs    03/09/21 0535  WBC 9.3  HGB 10.5*  HCT 32.6*  PLT 378    Recent Labs    03/09/21 0535  NA 138  K 3.1*  CL 110  CO2 22  GLUCOSE 90  BUN 15  CREATININE 1.78*  CALCIUM 8.7*     Intake/Output Summary (Last 24 hours) at 03/09/2021 0751 Last data filed at 03/09/2021 0342 Gross per 24 hour  Intake 240 ml  Output 300 ml  Net -60 ml         Physical Exam: Vital Signs Blood pressure (!) 155/93, pulse (!) 56, temperature 98.3 F (36.8 C), temperature source Oral, resp. rate 18, SpO2 98 %.  General: No acute distress Mood and affect are appropriate Heart: Regular rate and rhythm no rubs murmurs or extra sounds Lungs: Clear to auscultation, breathing unlabored, no rales or wheezes Abdomen: Positive bowel sounds, soft nontender to palpation, nondistended Extremities: No clubbing, cyanosis, or edema Skin: No evidence of breakdown, no evidence of rash   Musculoskeletal:     Cervical back: Normal range of motion. No rigidity.     Comments: R side 5/5 in RUE/RLE LUE- 4+/5 in Biceps, triceps, WE, grip and FA LLE- 5-/5 in HF/KE/DF and PF  Skin:    General: Skin is warm and dry.  Neurological:     Mental Status: He is alert.     Comments: Patient is alert.  Makes eye contact with examiner with noted left hemianopsia.  Follows simple commands.  Provides name and age some delay in processing. Intact to light touch in all 4 extremities Finger to nose searching pattern on L side Coordination impaired on L side- touching all fingers with thumb L pronator drift- mild to moderate  Psychiatric:     Comments: Slightly anxious c/o  panic attacks    Assessment/Plan: 1. Functional deficits which require 3+ hours per day of interdisciplinary therapy in a comprehensive inpatient rehab setting. Physiatrist is providing close team supervision and 24 hour management of active medical problems listed below. Physiatrist and rehab team continue to assess barriers to discharge/monitor patient progress toward functional and medical goals  Care Tool:  Bathing    Body parts bathed by patient: Right arm, Left arm, Chest, Front perineal area, Abdomen, Buttocks, Right upper leg, Left upper leg, Right lower leg, Left lower leg, Face         Bathing assist Assist Level: Set up assist     Upper Body Dressing/Undressing Upper body dressing   What is the patient wearing?: Pull over shirt    Upper body assist Assist Level: Set up assist    Lower Body Dressing/Undressing Lower body dressing      What is the patient wearing?: Pants     Lower body assist Assist for lower body dressing: Set up assist     Toileting Toileting Toileting Activity did not occur (  Clothing management and hygiene only): N/A (no void or bm)  Toileting assist Assist for toileting: Contact Guard/Touching assist Assistive Device Comment: walker   Transfers Chair/bed transfer  Transfers assist     Chair/bed transfer assist level: Contact Guard/Touching assist Chair/bed transfer assistive device: Programmer, multimedia   Ambulation assist      Assist level: Minimal Assistance - Patient > 75% Assistive device: Walker-rolling Max distance: 219ft   Walk 10 feet activity   Assist     Assist level: Moderate Assistance - Patient - 50 - 74% Assistive device: No Device   Walk 50 feet activity   Assist    Assist level: Moderate Assistance - Patient - 50 - 74% Assistive device: No Device    Walk 150 feet activity   Assist Walk 150 feet activity did not occur: Safety/medical concerns         Walk 10 feet on uneven  surface  activity   Assist Walk 10 feet on uneven surfaces activity did not occur: Safety/medical concerns         Wheelchair     Assist Is the patient using a wheelchair?: No   Wheelchair activity did not occur: N/A         Wheelchair 50 feet with 2 turns activity    Assist    Wheelchair 50 feet with 2 turns activity did not occur: N/A       Wheelchair 150 feet activity     Assist  Wheelchair 150 feet activity did not occur: N/A       Blood pressure (!) 155/93, pulse (!) 56, temperature 98.3 F (36.8 C), temperature source Oral, resp. rate 18, SpO2 98 %.   Medical Problem List and Plan: 1.  Left-sided weakness functional deficits secondary to subacute right PCA infarct secondary new onset atrial fibrillation             -patient may  shower             -ELOS/Goals: 13-15 days supervision  Continue CIR 2.  Antithrombotics: -DVT/anticoagulation:  Pharmaceutical: Other (comment) Eliquis             -antiplatelet therapy: N/A 3. Pain Management: Hydrocodone as needed 4. Panic attacks: Wellbutrin 450 mg daily             -stopped paxil due to mild psychomotor agitation , since can mix with Wellbutrin- went over with pt. Buspar TID PRN added.              -antipsychotic agents: N/A 5. Neuropsych: This patient is capable of making decisions on his own behalf. 6. Skin/Wound Care: Routine skin checks 7. Fluids/Electrolytes/Nutrition: Routine in and outs with follow-up chemistries 8.  Permissive hypertension.  Presently on Toprol-XL 125 mg daily.  Monitor with increased mobility.  Patient also on losartan 100 mg daily, Aldactone 25 mg daily, HCTZ 25 mg daily prior to admission.  Resume as needed Vitals:   03/08/21 1913 03/09/21 0342  BP: (!) 133/109 (!) 155/93  Pulse: (!) 43 (!) 56  Resp: 18 18  Temp: 97.7 F (36.5 C) 98.3 F (36.8 C)  SpO2: 95% 98%   May resume aldactone in am  9.  Hyperlipidemia.  Lipitor 10.  AKI.  Renal ultrasound negative.   Follow-up chemistries             Cr 2.0- will push fluids and recheck actually a little better is off diuretics 11.  New onset atrial fibrillation.  Continue Eliquis.  Cardiac rate bradycardic reduce metoprolol succinate to 100mg  12.  History of colon cancer with resection in 2009/chemotherapy.  Follow-up outpatient 13.  History of prostate cancer TURP 2016.  Follow-up outpatient 14. Nausea: Compazine 10mg  q6H prn added 15. Chronic epistaxis: outpatient referral to ENT  LOS: 4 days A FACE TO FACE EVALUATION WAS PERFORMED  Charlett Blake 03/09/2021, 7:51 AM

## 2021-03-09 NOTE — Progress Notes (Signed)
Physical Therapy Session Note  Patient Details  Name: George [name]. MRN: [id number] Date of Birth: 1945/07/07  Today's Date: 03/09/2021 PT Individual Time: 0930-1040 PT Individual Time Calculation (min): 70 min   Short Term Goals: Week 1:  PT Short Term Goal 1 (Week 1): Pt will complete bed mobility mod I PT Short Term Goal 2 (Week 1): Pt will complete bed<>chair transfers with CGA and LRAD PT Short Term Goal 3 (Week 1): Pt will ambulate 186ft with CGA and LRAD PT Short Term Goal 4 (Week 1): Pt will initiate stair training  Skilled Therapeutic Interventions/Progress Updates:     Pt seated EOB with wife at bedside to start session. Pt agreeable without reports of pain. Pt with tennis shoes donned prior to arrival. Sit<>stand to RW with CGA. Ambulates from his room to day room rehab gym, ~282ft, with minA and RW. Demo's ataxic gait with frequent catching for L foot and knee hyperextension in stance ~85% of the time. Required VC for safety awareness with RW management, increasing L hip/knee flexion, increasing L heel strike on initial contact, and widening BOS. Seated rest break in rehab gym. Focused remainder of session on higher level gait training with LiteGait support over treadmill. Harness donned/doffed in standing with minA for balance. Completed the following trials  Trial 1) 11min, 25 sec, 0.84mph, 271ft, BUE support, no BWS - inconsistent step length on L with some catching of toe. Continued to demonstrate knee hyperextension ~90% of the time during terminal stance. Downward gaze and tight grip to hand rails.   Trial 2) 29min 45 sec, 0.6 mph, 173ft, BUE support, no BWS  - provided stiff tapered heel wedge to trial for managing knee hyperextension. Limited success with pt continuing to demonstrate knee hyperextension ~75% of the time.   Trial 3) 5 min, 0.60mph, 213ft, BUE support, no BWS - improved stepping symmetry bilaterally with VC for increasing stride length and taking "longer  "steps.   Pt then ambulated with RW on level ground with minA with some mild improvement in gait deficits, ~42ft + 158ft. Once we returned to his room, onset of fatigue impacting safety awareness with RW and navigating his room. His LLE would often be outside of walker frame and lagging behind him.   Required assist for untieing his shoes due to coordination and apraxia deficits. Sit>supine completed at supervision level. Remained supine with all needs in reach and bed alarm on.   Therapy Documentation Precautions:  Precautions Precautions: Fall Precaution Comments: L inattention Restrictions Weight Bearing Restrictions: No General:     Therapy/Group: Individual Therapy  Alger Simons 03/09/2021, 7:48 AM

## 2021-03-09 NOTE — Progress Notes (Signed)
Occupational Therapy Session Note  Patient Details  Name: George [name]. MRN: [id number] Date of Birth: [birth year]  Today's Date: 03/09/2021 OT Individual Time: 1101-1200 OT Individual Time Calculation (min): 59 min    Short Term Goals: Week 1:  OT Short Term Goal 1 (Week 1): STGs=LTGs due to ELOS  Skilled Therapeutic Interventions/Progress Updates:  Patient met lying supine in bed in agreement with OT treatment session. Reports chronic pain in L shoulder at rest and with activity. Patient declined ADL tasks this date. Session with focus on LUE NMR, bimanual coordination in prep for ADLs, functional mobility and L attention. Min A and more than increased time/effort needed to don shoes secondary to deficits in bimanual coordination and LUE ataxia. Min A for functional mobility to rehab gym with cues for L attention and proximity to RW. Patient completed 9-hole peg test requiring 59.67 sec on R and more than 8 minutes on L dropping several pegs along the way. Patient then able to don bilateral gloves on hands with increased time/effort and Mod cues. Increased time/effort for bimanual manipulation to clean pegs at conclusion of activity. Patient required Max cues for recreating figuration of blocks on photos x2 trials. Functional mobility for return to hospital room with patient bumping into hand sanitizer on wall on L despite cues to avoid. Session concluded with patient lying supine in bed with call bell within reach, bed alarm activated and all needs met.    Therapy Documentation Precautions:  Precautions Precautions: Fall Precaution Comments: L inattention Restrictions Weight Bearing Restrictions: No General:    Therapy/Group: Individual Therapy  [name] R Howerton-Davis 03/09/2021, 6:59 AM

## 2021-03-10 DIAGNOSIS — I639 Cerebral infarction, unspecified: Secondary | ICD-10-CM | POA: Diagnosis not present

## 2021-03-10 LAB — BASIC METABOLIC PANEL
Anion gap: 5 (ref 5–15)
BUN: 16 mg/dL (ref 8–23)
CO2: 22 mmol/L (ref 22–32)
Calcium: 8.7 mg/dL — ABNORMAL LOW (ref 8.9–10.3)
Chloride: 109 mmol/L (ref 98–111)
Creatinine, Ser: 1.75 mg/dL — ABNORMAL HIGH (ref 0.61–1.24)
GFR, Estimated: 40 mL/min — ABNORMAL LOW (ref >60)
Glucose, Bld: 88 mg/dL (ref 70–99)
Potassium: 3.3 mmol/L — ABNORMAL LOW (ref 3.5–5.1)
Sodium: 136 mmol/L (ref 135–145)

## 2021-03-10 MED ORDER — POTASSIUM CHLORIDE 20 MEQ PO PACK
PACK | ORAL | Status: AC
  Filled 2021-03-10: qty 2

## 2021-03-10 MED ORDER — POTASSIUM CHLORIDE 20 MEQ PO PACK
40.0000 meq | PACK | Freq: Two times a day (BID) | ORAL | Status: DC
  Administered 2021-03-10: 40 meq via ORAL

## 2021-03-10 MED ORDER — POTASSIUM CHLORIDE 10 MEQ/100ML IV SOLN
10.0000 meq | INTRAVENOUS | Status: AC
  Administered 2021-03-10 (×2): 10 meq via INTRAVENOUS
  Filled 2021-03-10 (×2): qty 100

## 2021-03-10 NOTE — Progress Notes (Signed)
Speech Language Pathology Daily Session Note  Patient Details  Name: George [name]. MRN: [id number] Date of Birth: [birth year]  Today's Date: 03/10/2021 SLP Individual Time: 1016-1100 SLP Individual Time Calculation (min): 44 min  Short Term Goals: Week 1: SLP Short Term Goal 1 (Week 1): Patient will orient self to time via use of calendar and clock with min-modA verbal, visual cues to compensate for visual deficit. SLP Short Term Goal 2 (Week 1): Patient will perform complex level problem solving tasks (medication management, money management) with minA verbal and visual cues for accuracy. SLP Short Term Goal 3 (Week 1): Patient will demonstrate effective use of learned strategies to compensate for his physical and visual deficits during function tasks, simulated ADL's with minA verbal and visual cues. SLP Short Term Goal 4 (Week 1): Patient will recall and describe specific daily medical and therapeutic interventions with minA verbal cues for accuracy and use of memory aides as needed. SLP Short Term Goal 5 (Week 1): Patient will demonstrate adequate awareness to errors during functional task performance with min-modA verbal, visual cues.  Skilled Therapeutic Interventions: Pt seen for skilled ST with focus on cognitive communication goals, pt sitting EOB following OT session and wife wanting to focus on use of cell phone today. Pt reports increasing frustrating today with use of cell phone and wanting to "throw it across the room". When pt observed utilizing phone, he had difficulty with motor planning to touch small apps on home screen and was unaware of his secondary hand touching home screen impacting function. SLP providing stand for phone and stylus to eliminate need for use of hands which greatly impacted accuracy of navigating phone. SLP also changing background of phone to light color to increase contrast between apps/font due to pt visual impairment. Pt denying increasing font size at  this time. SLP providing pt with written medication list to increase awareness of new med regime, unable to sort BID pillbox this session due to time constraints. Pt independent sit to supine, bed alarm set and all needs within reach. Found wife and niece in the hallway and educated them on phone adaptations, they will practice with patient today. Cont ST POC.   Pain Pain Assessment Pain Scale: 0-10 Pain Score: 0-No pain  Therapy/Group: Individual Therapy  Dewaine Conger 03/10/2021, 11:15 AM

## 2021-03-10 NOTE — Progress Notes (Signed)
PROGRESS NOTE   Subjective/Complaints: Still fidgeting- notes he had similar symptoms when potassium was low in the past- he took 88meq BID for some time- discussed we are giving him 22meq this morning and can give 32meq IV (his preference) as well.   ROS: -epistaxis., + diarrhea, +fidgeting   Objective:   No results found. Recent Labs    03/09/21 0535  WBC 9.3  HGB 10.5*  HCT 32.6*  PLT 378   Recent Labs    03/09/21 0535 03/10/21 0507  NA 138 136  K 3.1* 3.3*  CL 110 109  CO2 22 22  GLUCOSE 90 88  BUN 15 16  CREATININE 1.78* 1.75*  CALCIUM 8.7* 8.7*    Intake/Output Summary (Last 24 hours) at 03/10/2021 1103 Last data filed at 03/10/2021 0809 Gross per 24 hour  Intake 240 ml  Output --  Net 240 ml        Physical Exam: Vital Signs Blood pressure (!) 152/85, pulse (!) 57, temperature 97.8 F (36.6 C), resp. rate 18, SpO2 100 %.  General: No acute distress Mood and affect are appropriate Heart: Bradycardic Lungs: Clear to auscultation, breathing unlabored, no rales or wheezes Abdomen: Positive bowel sounds, soft nontender to palpation, nondistended Extremities: No clubbing, cyanosis, or edema Skin: No evidence of breakdown, no evidence of rash   Musculoskeletal:     Cervical back: Normal range of motion. No rigidity.     Comments: R side 5/5 in RUE/RLE LUE- 4+/5 in Biceps, triceps, WE, grip and FA LLE- 5-/5 in HF/KE/DF and PF  Skin:    General: Skin is warm and dry.  Neurological:     Mental Status: He is alert.     Comments: Patient is alert.  Makes eye contact with examiner with noted left hemianopsia.  Follows simple commands.  Provides name and age some delay in processing. Intact to light touch in all 4 extremities Finger to nose searching pattern on L side Coordination impaired on L side- touching all fingers with thumb L pronator drift- mild to moderate  Psychiatric:     Comments:  Slightly anxious c/o panic attacks    Assessment/Plan: 1. Functional deficits which require 3+ hours per day of interdisciplinary therapy in a comprehensive inpatient rehab setting. Physiatrist is providing close team supervision and 24 hour management of active medical problems listed below. Physiatrist and rehab team continue to assess barriers to discharge/monitor patient progress toward functional and medical goals  Care Tool:  Bathing    Body parts bathed by patient: Right arm, Left arm, Chest, Front perineal area, Abdomen, Buttocks, Right upper leg, Left upper leg, Right lower leg, Left lower leg, Face         Bathing assist Assist Level: Set up assist     Upper Body Dressing/Undressing Upper body dressing   What is the patient wearing?: Pull over shirt    Upper body assist Assist Level: Set up assist    Lower Body Dressing/Undressing Lower body dressing      What is the patient wearing?: Pants     Lower body assist Assist for lower body dressing: Set up assist     Toileting Toileting Toileting Activity  did not occur Landscape architect and hygiene only): N/A (no void or bm)  Toileting assist Assist for toileting: Contact Guard/Touching assist Assistive Device Comment: walker   Transfers Chair/bed transfer  Transfers assist     Chair/bed transfer assist level: Minimal Assistance - Patient > 75% Chair/bed transfer assistive device: Programmer, multimedia   Ambulation assist      Assist level: Minimal Assistance - Patient > 75% Assistive device: Lite Gait Max distance: 248ft   Walk 10 feet activity   Assist     Assist level: Minimal Assistance - Patient > 75% Assistive device: Walker-rolling   Walk 50 feet activity   Assist    Assist level: Minimal Assistance - Patient > 75% Assistive device: Walker-rolling    Walk 150 feet activity   Assist Walk 150 feet activity did not occur: Safety/medical concerns  Assist level:  Minimal Assistance - Patient > 75% Assistive device: Walker-rolling    Walk 10 feet on uneven surface  activity   Assist Walk 10 feet on uneven surfaces activity did not occur: Safety/medical concerns         Wheelchair     Assist Is the patient using a wheelchair?: No   Wheelchair activity did not occur: N/A         Wheelchair 50 feet with 2 turns activity    Assist    Wheelchair 50 feet with 2 turns activity did not occur: N/A       Wheelchair 150 feet activity     Assist  Wheelchair 150 feet activity did not occur: N/A       Blood pressure (!) 152/85, pulse (!) 57, temperature 97.8 F (36.6 C), resp. rate 18, SpO2 100 %.   Medical Problem List and Plan: 1.  Left-sided weakness functional deficits secondary to subacute right PCA infarct secondary new onset atrial fibrillation             -patient may  shower             -ELOS/Goals: 13-15 days supervision  Continue CIR 2.  Antithrombotics: -DVT/anticoagulation:  Pharmaceutical: Other (comment) Eliquis             -antiplatelet therapy: N/A 3. Pain Management: Hydrocodone as needed 4. Panic attacks: Wellbutrin 450 mg daily             -stopped paxil due to mild psychomotor agitation , since can mix with Wellbutrin- went over with pt. Buspar TID PRN added.              -antipsychotic agents: N/A 5. Neuropsych: This patient is capable of making decisions on his own behalf. 6. Skin/Wound Care: Routine skin checks 7. Fluids/Electrolytes/Nutrition: Routine in and outs with follow-up chemistries 8.  Permissive hypertension.  Presently on Toprol-XL 125 mg daily.  Monitor with increased mobility.  Patient also on losartan 100 mg daily, Aldactone 25 mg daily, HCTZ 25 mg daily prior to admission.  Resume as needed Vitals:   03/10/21 0514 03/10/21 0521  BP: (!) 181/96 (!) 152/85  Pulse: (!) 57   Resp: 18   Temp: 97.8 F (36.6 C)   SpO2: 100%    May resume aldactone in am  Supplement potassium as  below 9.  Hyperlipidemia.  Lipitor 10.  AKI.  Renal ultrasound negative.  Repeat Cr tomorrow.  11.  New onset atrial fibrillation.  Continue Eliquis.  Cardiac rate bradycardic reduce metoprolol succinate to 100mg  12.  History of colon cancer with resection in 2009/chemotherapy.  Follow-up outpatient 13.  History of prostate cancer TURP 2016.  Follow-up outpatient 14. Nausea: Compazine 10mg  q6H prn added 15. Chronic epistaxis: outpatient referral to ENT 16. Hypokalemia: supplement 52meq IV and 36meq oral, repeat K+ tomorrow.   LOS: 5 days A FACE TO FACE EVALUATION WAS PERFORMED  Danyell Awbrey P Merrell Rettinger 03/10/2021, 11:03 AM

## 2021-03-10 NOTE — Progress Notes (Signed)
Occupational Therapy Session Note  Patient Details  Name: George [name]. MRN: [id number] Date of Birth: 1946-01-25  Today's Date: 03/10/2021 OT Individual Time: 3976-7341 session 1  OT Individual Time Calculation (min): 58 min  Session 2: 9379-0240 ( 25 mins)  Short Term Goals: Week 1:  OT Short Term Goal 1 (Week 1): STGs=LTGs due to ELOS  Skilled Therapeutic Interventions/Progress Updates:  Session 1: Pt greeted seated EOB reporting having nightmares and hallucinations but agreeable to OT intervention. Session focus on dynamic standing balance, functional mobility, LUE coordination and [place]. Pt completed ambulatory transfer from room to gym with RW and MIN A, MIN verbal cues for heel strike pattern on LLE during ambulation as pts LLE presents with incoordination. Pt completed dynamic standing balance activity where instructed to reach out of BOS to retrieve horseshoes with LUE and toss to target x2 trials, CGA for dynamic standing balance with unilateral support on RW. Pt additionally completed task where pt used LUE to retrieve horsehoes from RW and place OH on ledge of mirror to promote improved GM movements and coordination with LUE x2 trials with overall CGA. Pt completed seated [place] task where pt instructed to duplicate leggo structure from visual aid provided, pt could not duplicate structure without total A. Pt reports this is "hard on my brain." Pt appears to have some visual perception issues and problem solving issues as pt unable complete task without total A and stey by step multimodal cues. Pt completed ambulatory transfer back to room with MIN A for balance and MIN verbal cues needed to facilitate LLE coordination.  pt left  seated EOB with family present and SLP entering for session.                     Session 2:pt greeted supine in bed agreeable to OT intervention. Pt needed MOD verbal cues to sequence supine>sit as this OTA instructed pt to come sit up, and pt began doing sit-ups.  Pt completed sit<>stand from EOB with rw and CGA. MIN A for functional mobility from room to gym with rw. Pt completed  LLE coordination tasks with pt instructed to step on colored dots on floor placed forward and laterally to promote improved LLE coordination and overall GM movement. Pt completed task with CGA, graded task up and had pt completed ~ 10 ft of functional mobility with colored dots placed on floor with pt instructed to step on dots with LLE during gait in order to promote improved GM movements and coordination during functional gait. Pt completed task forward with MIN A with RW, MOD A needed to step backwards onto colored dots as pt with impaired proprioception and balance when stepping backwards needing assist to manage RW and MOD verbal cues for body mechanics and sequencing. Pt completed functional mobility back to room with MIN A. Issued pt compliant cubes to work on [facility] with LUE in room with pt verbalizing understanding. Pt left seated EOB with bed alarm activated and all needs within reach.      Therapy Documentation Precautions:  Precautions Precautions: Fall Precaution Comments: L inattention Restrictions Weight Bearing Restrictions: No  Pain: no pain reported during sessions    Therapy/Group: Individual Therapy  Corinne Ports [facility] 03/10/2021, 12:09 PM

## 2021-03-10 NOTE — Progress Notes (Signed)
Physical Therapy Session Note  Patient Details  Name: George [name]. MRN: [id number] Date of Birth: 07-25-1945  Today's Date: 03/10/2021 PT Individual Time: 1415-1525 PT Individual Time Calculation (min): 70 min   Short Term Goals: Week 1:  PT Short Term Goal 1 (Week 1): Pt will complete bed mobility mod I PT Short Term Goal 2 (Week 1): Pt will complete bed<>chair transfers with CGA and LRAD PT Short Term Goal 3 (Week 1): Pt will ambulate 122ft with CGA and LRAD PT Short Term Goal 4 (Week 1): Pt will initiate stair training  Skilled Therapeutic Interventions/Progress Updates:     Pt seen supine in bed to start session. NT at bedside obtaining vitals - WNL. Pt agreeable to PT tx without reports of pain. Pt with PIV running and per RN, unable to disconnect during therapy session. Supine<>sit completed at supervision level with bed features (pt sitting up prior to allowing PT time to lower HOB to simulate home environment). Pt already with tennis shoes on and completes sit<>stand to RW with CGA.  Ambulates ~119ft with CGA and RW from his room to main rehab gym - gait is quite ataxic with inconsistent step lengths with LLE, narrow BOS, and catching of L foot during swing phase. With distractions or something as simple as conversation, gait deficits worsen, suggesting poor dual-cog ability. In rehab gym, focused remainder of session on standing balance, coordination training, and motor planning. Used mirror for visual feedback during all tasks. Completed standing heel raises with BUE support to RW - able to achieve ~50% AROM with poor motor planning noted - required VC for slowing down task and breaking task to parts, as well as demonstration for technique. Pt instructed in several other tasks, including unilateral high knees, standing on blue airex pad while completing simple puzzle to challenge LUE coordination and standing balance, and goblet squats with 3kg med ball. Required minA for balance  while completing all tasks due to poor postural control and body awareness in space, poor recognition of deficits in standing and dual tasking, and ataxia in L hemibody. Pt also reports chronic neuropathy in feet, also contributing to balance impairments. Pt ambulated back to his room, ~126ft, with CGA and RW with cues mostly for increasing L heel strike, lengthening L step length, and keeping body within walker frame. Pt concluded session seated in recliner with safety belt alarm on, niece at bedside, all needs within reach. Reminded to call for staff assistance when ready to return to bed or for any other needs. Both voiced understanding.   Therapy Documentation Precautions:  Precautions Precautions: Fall Precaution Comments: L inattention Restrictions Weight Bearing Restrictions: No General:    Therapy/Group: Individual Therapy  Alger Simons 03/10/2021, 7:40 AM

## 2021-03-11 DIAGNOSIS — I639 Cerebral infarction, unspecified: Secondary | ICD-10-CM | POA: Diagnosis not present

## 2021-03-11 LAB — BASIC METABOLIC PANEL
Anion gap: 10 (ref 5–15)
BUN: 15 mg/dL (ref 8–23)
CO2: 21 mmol/L — ABNORMAL LOW (ref 22–32)
Calcium: 9.3 mg/dL (ref 8.9–10.3)
Chloride: 107 mmol/L (ref 98–111)
Creatinine, Ser: 1.88 mg/dL — ABNORMAL HIGH (ref 0.61–1.24)
GFR, Estimated: 36 mL/min — ABNORMAL LOW (ref >60)
Glucose, Bld: 126 mg/dL — ABNORMAL HIGH (ref 70–99)
Potassium: 4.1 mmol/L (ref 3.5–5.1)
Sodium: 138 mmol/L (ref 135–145)

## 2021-03-11 MED ORDER — BUPROPION HCL ER (XL) 300 MG PO TB24
450.0000 mg | ORAL_TABLET | Freq: Every morning | ORAL | Status: DC
  Administered 2021-03-12 – 2021-03-14 (×3): 450 mg via ORAL
  Filled 2021-03-11 (×3): qty 1

## 2021-03-11 MED ORDER — CALCIUM POLYCARBOPHIL 625 MG PO TABS
625.0000 mg | ORAL_TABLET | Freq: Every day | ORAL | Status: DC
  Administered 2021-03-11 – 2021-03-14 (×4): 625 mg via ORAL
  Filled 2021-03-11 (×4): qty 1

## 2021-03-11 NOTE — Progress Notes (Signed)
Occupational Therapy Session Note  Patient Details  Name: George [name]. MRN: [id number] Date of Birth: 1946-02-04  Today's Date: 03/11/2021 OT Individual Time: 9242-6834 OT Individual Time Calculation (min): 73 min    Short Term Goals: Week 1:  OT Short Term Goal 1 (Week 1): STGs=LTGs due to ELOS  Skilled Therapeutic Interventions/Progress Updates:  Pt greeted supine in bed  agreeable to OT intervention. Session focus on dynamic standing balance, LUE coordination/FMC and L inattention. Pt completed functional mobility from room to gym with Rw and CGA- MIN A as pt continues to present with ataxic gait. Worked on visual scanning and dynamic standing balance using BITS. Pt first instructed to use LUE only to reach for dots on screen in standing with RUE providing unilateral support on RW.    LUE 31.82% accuracy  14 hits with 30 misses 4.27 reaction time Slowest in L lower quad      Pt completed second trial with RUE only in standing: 96.67% accuracy 2.02 sec reaction time 1 miss and 29 hits  Slowest in L upper quadrant.        Graded task up to challenge dynamic standing balance with pt standing on air cushion to reach out of BOS to reach for letters with BUEs in correct alphabetical order, up to MIN A needed for dynamic standing balance with at least one UE supported on RW Pt completed task with 48.15% accuracy 4 mins total  9.27 reaction time 28 misses; slowest in L upper quad. Of note pt did mistake a "U" for a "J" Pt completed seated trail making task to promote improved L inattention. Pt completed first trial with LUE with pt instructed to follow numbers 1-25. Pt completed task in 8 mins and 43 secs with 2 errors and and 31 interruptions, MAX verbal cues for problem solving and sequencing. Pt completed second trial with RUE with pt completing task in 4 mins and 23 secs with 8 errors and and 24 interruptions.  Overall, pt continues to present with L sided inattention needed up  to MAX verbal cues to scan fully to L to locate stimulus.  Education provided on compensatory methods for L inattention and encouraged pt work on word searches or visual scanning tasks in his room.  Pt completed additional dynamic balance, midline orientation task where pt instructed to stand in front of mirror with OTA providing pt with randomized numbers and instructing pt to place numbers in order on mirror while leaving room for other numbers. pt with some depth perception/ visual spatial deficits noted as pt noted attempt to leave room for other numbers but not nearly enough space, pt also noted to leave one number behind as it was positioned too far L and pt did not notice it without cues. Pt completed task with MIN A for balance as pt was able to use BUEs to complete task with no UE support.  Pt additional completed seated LUE [place] task with pt instructed to place weighted clothespins along rim of box,  pt completed task with supervision. Pt completed functional back to room with rw and MIN A. pt left seated EOB with all needs within reach.                        Therapy Documentation Precautions:  Precautions Precautions: Fall Precaution Comments: L inattention Restrictions Weight Bearing Restrictions: No  Pain: no pain reported during session     Therapy/Group: Individual Therapy  Precious Haws 03/11/2021,  10:45 AM

## 2021-03-11 NOTE — Progress Notes (Signed)
PROGRESS NOTE   Subjective/Complaints: K+ normalized Still with sundowning this morning. Feels that sundowning worse since he is not at home and I agree. Team conference today   ROS: -epistaxis., + diarrhea, +fidgeting, +panic attacks   Objective:   No results found. Recent Labs    03/09/21 0535  WBC 9.3  HGB 10.5*  HCT 32.6*  PLT 378   Recent Labs    03/10/21 0507 03/11/21 0921  NA 136 138  K 3.3* 4.1  CL 109 107  CO2 22 21*  GLUCOSE 88 126*  BUN 16 15  CREATININE 1.75* 1.88*  CALCIUM 8.7* 9.3    Intake/Output Summary (Last 24 hours) at 03/11/2021 1410 Last data filed at 03/11/2021 1330 Gross per 24 hour  Intake 840 ml  Output --  Net 840 ml        Physical Exam: Vital Signs Blood pressure 119/81, pulse 92, temperature 97.6 F (36.4 C), resp. rate 20, SpO2 99 %.  General: No acute distress, lying down in bed comfortably Mood and affect are appropriate Heart: Tachycardic Lungs: Clear to auscultation, breathing unlabored, no rales or wheezes Abdomen: Positive bowel sounds, soft nontender to palpation, nondistended Extremities: No clubbing, cyanosis, or edema Skin: No evidence of breakdown, no evidence of rash   Musculoskeletal:     Cervical back: Normal range of motion. No rigidity.     Comments: R side 5/5 in RUE/RLE LUE- 4+/5 in Biceps, triceps, WE, grip and FA LLE- 5-/5 in HF/KE/DF and PF  Skin:    General: Skin is warm and dry.  Neurological:     Mental Status: He is alert.     Comments: Patient is alert.  Makes eye contact with examiner with noted left hemianopsia.  Follows simple commands.  Provides name and age some delay in processing. Intact to light touch in all 4 extremities Finger to nose searching pattern on L side Coordination impaired on L side- touching all fingers with thumb L pronator drift- mild to moderate  Psychiatric:     Comments: Slightly anxious c/o panic attacks     Assessment/Plan: 1. Functional deficits which require 3+ hours per day of interdisciplinary therapy in a comprehensive inpatient rehab setting. Physiatrist is providing close team supervision and 24 hour management of active medical problems listed below. Physiatrist and rehab team continue to assess barriers to discharge/monitor patient progress toward functional and medical goals  Care Tool:  Bathing    Body parts bathed by patient: Right arm, Left arm, Chest, Front perineal area, Abdomen, Buttocks, Right upper leg, Left upper leg, Right lower leg, Left lower leg, Face         Bathing assist Assist Level: Set up assist     Upper Body Dressing/Undressing Upper body dressing   What is the patient wearing?: Pull over shirt    Upper body assist Assist Level: Set up assist    Lower Body Dressing/Undressing Lower body dressing      What is the patient wearing?: Pants     Lower body assist Assist for lower body dressing: Set up assist     Toileting Toileting Toileting Activity did not occur (Clothing management and hygiene only): N/A (no  void or bm)  Toileting assist Assist for toileting: Contact Guard/Touching assist Assistive Device Comment: walker   Transfers Chair/bed transfer  Transfers assist     Chair/bed transfer assist level: Minimal Assistance - Patient > 75% Chair/bed transfer assistive device: Programmer, multimedia   Ambulation assist      Assist level: Minimal Assistance - Patient > 75% Assistive device: Walker-rolling Max distance: 200'   Walk 10 feet activity   Assist     Assist level: Minimal Assistance - Patient > 75% Assistive device: Walker-rolling   Walk 50 feet activity   Assist    Assist level: Minimal Assistance - Patient > 75% Assistive device: Walker-rolling    Walk 150 feet activity   Assist Walk 150 feet activity did not occur: Safety/medical concerns  Assist level: Minimal Assistance - Patient >  75% Assistive device: Walker-rolling    Walk 10 feet on uneven surface  activity   Assist Walk 10 feet on uneven surfaces activity did not occur: Safety/medical concerns         Wheelchair     Assist Is the patient using a wheelchair?: No   Wheelchair activity did not occur: N/A         Wheelchair 50 feet with 2 turns activity    Assist    Wheelchair 50 feet with 2 turns activity did not occur: N/A       Wheelchair 150 feet activity     Assist  Wheelchair 150 feet activity did not occur: N/A       Blood pressure 119/81, pulse 92, temperature 97.6 F (36.4 C), resp. rate 20, SpO2 99 %.   Medical Problem List and Plan: 1.  Left-sided weakness functional deficits secondary to subacute right PCA infarct secondary new onset atrial fibrillation             -patient may  shower             -ELOS/Goals: 13-15 days supervision  Continue CIR 2.  Antithrombotics: -DVT/anticoagulation:  Pharmaceutical: Other (comment) Eliquis             -antiplatelet therapy: N/A 3. Pain: N/A. D/c hydrocodone 4. Panic attacks: Continue Wellbutrin 450 mg daily             -stopped paxil due to mild psychomotor agitation , since can mix with Wellbutrin- went over with pt. Buspar TID PRN added.              -antipsychotic agents: N/A 5. Neuropsych: This patient is capable of making decisions on his own behalf. 6. Skin/Wound Care: Routine skin checks 7. Fluids/Electrolytes/Nutrition: Routine in and outs with follow-up chemistries 8.  Permissive hypertension.  Presently on Toprol-XL 125 mg daily.  Monitor with increased mobility.  Patient also on losartan 100 mg daily, Aldactone 25 mg daily, HCTZ 25 mg daily prior to admission.  Resume as needed Vitals:   03/11/21 0508 03/11/21 1337  BP: (!) 140/92 119/81  Pulse:  92  Resp:  20  Temp:  97.6 F (36.4 C)  SpO2:  99%   May resume aldactone in am  Supplement potassium as below 9.  Hyperlipidemia.  Lipitor 10.  AKI.  Renal  ultrasound negative.  Repeat Cr tomorrow.  11.  New onset atrial fibrillation.  Continue Eliquis.  Cardiac rate bradycardic reduce metoprolol succinate to 100mg  12.  History of colon cancer with resection in 2009/chemotherapy.  Follow-up outpatient 13.  History of prostate cancer TURP 2016.  Follow-up outpatient 14. Nausea:  Continue Compazine 10mg  q6H prn added 15. Chronic epistaxis: outpatient referral to ENT 16. Hypokalemia: supplement 3meq IV and 40meq oral, repeat K+ tomorrow. Normalized, repeat BMP tomorrow 17. Diarrhea: add fiber  LOS: 6 days A FACE TO FACE EVALUATION WAS PERFORMED  Natiya Seelinger P Nasiya Pascual 03/11/2021, 2:10 PM

## 2021-03-11 NOTE — Progress Notes (Signed)
Physical Therapy Session Note  Patient Details  Name: George [name]. MRN: [id number] Date of Birth: 09/23/1945  Today's Date: 03/11/2021 PT Individual Time: 0800-0855 PT Individual Time Calculation (min): 55 min   Short Term Goals: Week 1:  PT Short Term Goal 1 (Week 1): Pt will complete bed mobility mod I PT Short Term Goal 2 (Week 1): Pt will complete bed<>chair transfers with CGA and LRAD PT Short Term Goal 3 (Week 1): Pt will ambulate 197ft with CGA and LRAD PT Short Term Goal 4 (Week 1): Pt will initiate stair training  Skilled Therapeutic Interventions/Progress Updates:     Pt seen supine in bed with wife at bedside - pt agreeable to PT tx without reports of pain. He does report that he had x2 episodes of "panic attacks" that occurred last night and this morning - reports he's received his morning medications and feels fine now. Supine<>sit completed at supervision level. Sit<>stand to RW with CGA. Ambulates from his room to main rehab gym, ~169ft, with CGA and RW. Pt reports feeling "woozy" and "lightheaded." BP assessed in rehab gym: Sitting: 154/108 HR 49 Standing: 136/93 HR 65 *Pt reporting improved symptoms, only mild. Treatment to tolerance for remainder of session. No other reports of these symptoms during session  Attached 3# ankle weight to LLE. Focused majority of session on LLE proprioception/coordination and dynamic standing balance in // bars. Required minA overall for standing balance for each task. -Bosu ball toe taps with overpressure on LLE (2x10) -Full squats on declined wedge (2x10) -lateral step ups on L and R to 6inch platform (1x10, bilaterally) -standing Bosu ball with balance progressions (BUE support -> unilateral UE support -> fingertip support -> no UE support)  Pt ambulated with CGA and RW with 3# ankle weight attached to LLE, ~129ft + ~64ft. Improved LLE feedback with ankle weight with less foot catching and improve stride length. Seated rest break in  ADL apartment room on low sitting sofa couch. Able to complete furniture transfer with CGA and RW, slightly more effortful but capable.   Pt completed session seated EOB with bed alarm on, wife at bedside, all needs within reach.   Therapy Documentation Precautions:  Precautions Precautions: Fall Precaution Comments: L inattention Restrictions Weight Bearing Restrictions: No General:     Therapy/Group: Individual Therapy  Alger Simons 03/11/2021, 7:32 AM

## 2021-03-11 NOTE — Progress Notes (Signed)
Patient ID: George Axtman Jr., male   DOB: 03/25/1946, 75 y.o.   MRN: [id number]  Met with pt and wife how is presenting his room to update regarding team conference goals of supervision level and target discharge ate of 12/8. Pt feels he is making progress and is trying to slow down, wife has been here and seen him in therapies. Both are pleased with his progress and feel comfortable with discharge date. Work on discharge needs.  

## 2021-03-11 NOTE — Progress Notes (Signed)
Speech Language Pathology Daily Session Note  Patient Details  Name: George [name]. MRN: [id number] Date of Birth: 1945/06/29  Today's Date: 03/11/2021 SLP Individual Time: 1405-1500 SLP Individual Time Calculation (min): 55 min  Short Term Goals: Week 1: SLP Short Term Goal 1 (Week 1): Patient will orient self to time via use of calendar and clock with min-modA verbal, visual cues to compensate for visual deficit. SLP Short Term Goal 2 (Week 1): Patient will perform complex level problem solving tasks (medication management, money management) with minA verbal and visual cues for accuracy. SLP Short Term Goal 3 (Week 1): Patient will demonstrate effective use of learned strategies to compensate for his physical and visual deficits during function tasks, simulated ADL's with minA verbal and visual cues. SLP Short Term Goal 4 (Week 1): Patient will recall and describe specific daily medical and therapeutic interventions with minA verbal cues for accuracy and use of memory aides as needed. SLP Short Term Goal 5 (Week 1): Patient will demonstrate adequate awareness to errors during functional task performance with min-modA verbal, visual cues.  Skilled Therapeutic Interventions: Pt seen for skilled ST with focus on cognitive goals, pt in bed and agreeable to tx. Pt reports having a bad morning/day but has improved this afternoon. SLP facilitating alternating attention task by providing Supervision A cues for thought organization and accuracy. Pt talks about how he stays in his home 22-23 hours a day and is a homebody. He is currently struggling between wanting to be home and knowing CIR is "the best place" for him. Pt reporting that when the time comes that he wants to go home he will "sign myself out" and states he has left AMA during previous hospitalizations. Pt is receptive to education on rehab process, goals of therapy and burden of care for wife if he leaves too early. MD, SW and team notified  of patient's desires to leave. Pt demonstrates impulsivity and decreased safety during problem solving tasks this date, is limited by anxiety. Pt does state he is very pleased with phone adaptations and is able to navigate with more accuracy and independence. Pt left in bed with all needs within reach. Cont ST POC.   Pain Pain Assessment Pain Scale: 0-10 Pain Score: 0-No pain  Therapy/Group: Individual Therapy  Dewaine Conger 03/11/2021, 3:27 PM

## 2021-03-12 DIAGNOSIS — I639 Cerebral infarction, unspecified: Secondary | ICD-10-CM | POA: Diagnosis not present

## 2021-03-12 LAB — BASIC METABOLIC PANEL
Anion gap: 8 (ref 5–15)
BUN: 22 mg/dL (ref 8–23)
CO2: 22 mmol/L (ref 22–32)
Calcium: 9.2 mg/dL (ref 8.9–10.3)
Chloride: 108 mmol/L (ref 98–111)
Creatinine, Ser: 1.81 mg/dL — ABNORMAL HIGH (ref 0.61–1.24)
GFR, Estimated: 38 mL/min — ABNORMAL LOW (ref >60)
Glucose, Bld: 96 mg/dL (ref 70–99)
Potassium: 3.4 mmol/L — ABNORMAL LOW (ref 3.5–5.1)
Sodium: 138 mmol/L (ref 135–145)

## 2021-03-12 MED ORDER — POTASSIUM CHLORIDE CRYS ER 20 MEQ PO TBCR
40.0000 meq | EXTENDED_RELEASE_TABLET | Freq: Once | ORAL | Status: AC
  Administered 2021-03-12: 40 meq via ORAL
  Filled 2021-03-12: qty 2

## 2021-03-12 MED ORDER — POTASSIUM CHLORIDE 10 MEQ/100ML IV SOLN
10.0000 meq | INTRAVENOUS | Status: AC
  Administered 2021-03-12 (×2): 10 meq via INTRAVENOUS
  Filled 2021-03-12 (×2): qty 100

## 2021-03-12 NOTE — Progress Notes (Addendum)
PT approached  RN that patient expressed he will go AMA. anxiety medications given. RN talked to wife and she said he told her as well. Dan PA notified and he said to give his anxiety medications. Charge nurse made aware.

## 2021-03-12 NOTE — Progress Notes (Signed)
Ok to replace K= 3.4 with Kdur 78meq x1 and 21meq IV x1 per Dr. Ranell Patrick.  Onnie Boer, PharmD, BCIDP, AAHIVP, CPP Infectious Disease Pharmacist 03/12/2021 8:32 AM

## 2021-03-12 NOTE — Progress Notes (Signed)
Occupational Therapy Session Note  Patient Details  Name: George [name]. MRN: [id number] Date of Birth: 05-01-1945  Today's Date: 03/12/2021 OT Individual Time: 1100-1200 OT Individual Time Calculation (min): 60 min    Short Term Goals: Week 1:  OT Short Term Goal 1 (Week 1): STGs=LTGs due to ELOS  Skilled Therapeutic Interventions/Progress Updates:    Pt sitting up at EOB, no c/o pain, agreeable to OT session.  Discussed at length, discharge planning including layout of home and specifically bathroom layout.  Pt reports he already has plans to have a friend install grab bars in his shower and next to toilet.  He also reports planning have them install a small shower bench within the shower.  This shower has a 6 inch threshold per pt.  Blocked practice of shower transfer with threshold step over and pt requiring min assist to complete safely.  Encouraged pt to consider using a shower bench to increase safety and independence during shower transfers, however pt reports he feels that he can do the transfer by himself if he just holds onto the grab bars, despite therapist attempting to increase awareness into deficits.  Assessed coordination BUE and noted moderate ataxia in LUE.  Pt also attempted to tie shoes but unable to despite multiple attempts and increased time. Provided forward chaining and educated on benefits of elastic shoe laces versus slip on sneakers to increase independence.  Pt reports he prefers just buying a pair of slip on sneakers and politely declining shoe laces.  Call bell in reach, seat alarm on at end of session.  Therapy Documentation Precautions:  Precautions Precautions: Fall Precaution Comments: L inattention Restrictions Weight Bearing Restrictions: No    Therapy/Group: Individual Therapy  Ezekiel Slocumb 03/12/2021, 1:23 PM

## 2021-03-12 NOTE — Progress Notes (Signed)
Speech Language Pathology Daily Session Note  Patient Details  Name: George [name]. MRN: [id number] Date of Birth: [birth year]  Today's Date: 03/12/2021 SLP Individual Time: 1000-1100 SLP Individual Time Calculation (min): 60 min  Short Term Goals: Week 1: SLP Short Term Goal 1 (Week 1): Patient will orient self to time via use of calendar and clock with min-modA verbal, visual cues to compensate for visual deficit. SLP Short Term Goal 2 (Week 1): Patient will perform complex level problem solving tasks (medication management, money management) with minA verbal and visual cues for accuracy. SLP Short Term Goal 3 (Week 1): Patient will demonstrate effective use of learned strategies to compensate for his physical and visual deficits during function tasks, simulated ADL's with minA verbal and visual cues. SLP Short Term Goal 4 (Week 1): Patient will recall and describe specific daily medical and therapeutic interventions with minA verbal cues for accuracy and use of memory aides as needed. SLP Short Term Goal 5 (Week 1): Patient will demonstrate adequate awareness to errors during functional task performance with min-modA verbal, visual cues.  Skilled Therapeutic Interventions:   Patient seen for skilled ST session focusing on cognitive function goals. When SLP arrived in room, patient was finishing up in bathroom and SLP and patient's spouse assisted him with RW ambulation back to bed. He was observed to be impulsive and fast with his movements and the left side of walker became briefly stuck on bed rails with patient unaware. When patient then sitting edge of bed, SLP asked him about his ambulation and he denied any difficulty. SLP introduced task of alternating attention to put pvc pipes together to match design on photo. Initially, patient required modA verbal and modeling cues to perform however he did improve to then perform with minA cues overall. 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 SLP did not directly state but patient does not appear aware of the fact that he will not be able to safely complete these same tasks upon discharge. SLP plans to continue with education of patient and spouse regarding progress but also limitations. Patient continues to benefit from skilled SLP intervention to maximize cognitive function prior to discharge.  Pain Pain Assessment Pain Scale: 0-10 Pain Score: 0-No pain  Therapy/Group: Individual Therapy  Sonia Baller, MA, CCC-SLP Speech Therapy

## 2021-03-12 NOTE — Progress Notes (Signed)
PROGRESS NOTE   Subjective/Complaints: Burning with IV potassium- will stick to po in the future Repeat BMP tomorrow Cr slightly improved  ROS: -epistaxis., + diarrhea, +fidgeting, +panic attacks, +burning with IV potassium   Objective:   No results found. No results for input(s): WBC, HGB, HCT, PLT in the last 72 hours.  Recent Labs    03/11/21 0921 03/12/21 0530  NA 138 138  K 4.1 3.4*  CL 107 108  CO2 21* 22  GLUCOSE 126* 96  BUN 15 22  CREATININE 1.88* 1.81*  CALCIUM 9.3 9.2    Intake/Output Summary (Last 24 hours) at 03/12/2021 1315 Last data filed at 03/12/2021 0733 Gross per 24 hour  Intake 360 ml  Output --  Net 360 ml        Physical Exam: Vital Signs Blood pressure (!) 129/97, pulse 72, temperature 98.5 F (36.9 C), temperature source Oral, resp. rate 18, weight 84.5 kg, SpO2 97 %.  General: No acute distress, lying down in bed comfortably Mood and affect are appropriate Heart: Tachycardic, elevated diastolic BP Lungs: Clear to auscultation, breathing unlabored, no rales or wheezes Abdomen: Positive bowel sounds, soft nontender to palpation, nondistended Extremities: No clubbing, cyanosis, or edema Skin: No evidence of breakdown, no evidence of rash   Musculoskeletal:     Cervical back: Normal range of motion. No rigidity.     Comments: R side 5/5 in RUE/RLE LUE- 4+/5 in Biceps, triceps, WE, grip and FA LLE- 5-/5 in HF/KE/DF and PF  Skin:    General: Skin is warm and dry.  Neurological:     Mental Status: He is alert.     Comments: Patient is alert.  Makes eye contact with examiner with noted left hemianopsia.  Follows simple commands.  Provides name and age some delay in processing. Intact to light touch in all 4 extremities Finger to nose searching pattern on L side Coordination impaired on L side- touching all fingers with thumb L pronator drift- mild to moderate  Psychiatric:      Comments: Slightly anxious c/o panic attacks    Assessment/Plan: 1. Functional deficits which require 3+ hours per day of interdisciplinary therapy in a comprehensive inpatient rehab setting. Physiatrist is providing close team supervision and 24 hour management of active medical problems listed below. Physiatrist and rehab team continue to assess barriers to discharge/monitor patient progress toward functional and medical goals  Care Tool:  Bathing    Body parts bathed by patient: Right arm, Left arm, Chest, Front perineal area, Abdomen, Buttocks, Right upper leg, Left upper leg, Right lower leg, Left lower leg, Face         Bathing assist Assist Level: Set up assist     Upper Body Dressing/Undressing Upper body dressing   What is the patient wearing?: Pull over shirt    Upper body assist Assist Level: Set up assist    Lower Body Dressing/Undressing Lower body dressing      What is the patient wearing?: Pants     Lower body assist Assist for lower body dressing: Set up assist     Toileting Toileting Toileting Activity did not occur (Clothing management and hygiene only): N/A (no  void or bm)  Toileting assist Assist for toileting: Contact Guard/Touching assist Assistive Device Comment: walker   Transfers Chair/bed transfer  Transfers assist     Chair/bed transfer assist level: Minimal Assistance - Patient > 75% Chair/bed transfer assistive device: Programmer, multimedia   Ambulation assist      Assist level: Minimal Assistance - Patient > 75% Assistive device: Walker-rolling Max distance: 200'   Walk 10 feet activity   Assist     Assist level: Minimal Assistance - Patient > 75% Assistive device: Walker-rolling   Walk 50 feet activity   Assist    Assist level: Minimal Assistance - Patient > 75% Assistive device: Walker-rolling    Walk 150 feet activity   Assist Walk 150 feet activity did not occur: Safety/medical  concerns  Assist level: Minimal Assistance - Patient > 75% Assistive device: Walker-rolling    Walk 10 feet on uneven surface  activity   Assist Walk 10 feet on uneven surfaces activity did not occur: Safety/medical concerns         Wheelchair     Assist Is the patient using a wheelchair?: No   Wheelchair activity did not occur: N/A         Wheelchair 50 feet with 2 turns activity    Assist    Wheelchair 50 feet with 2 turns activity did not occur: N/A       Wheelchair 150 feet activity     Assist  Wheelchair 150 feet activity did not occur: N/A       Blood pressure (!) 129/97, pulse 72, temperature 98.5 F (36.9 C), temperature source Oral, resp. rate 18, weight 84.5 kg, SpO2 97 %.   Medical Problem List and Plan: 1.  Left-sided weakness functional deficits secondary to subacute right PCA infarct secondary new onset atrial fibrillation             -patient may  shower             -ELOS/Goals: 13-15 days supervision  Continue CIR 2.  Antithrombotics: -DVT/anticoagulation:  Pharmaceutical: Other (comment) Eliquis             -antiplatelet therapy: N/A 3. Pain: N/A. D/c hydrocodone 4. Panic attacks: Continue Wellbutrin 450 mg daily             -stopped paxil due to mild psychomotor agitation , since can mix with Wellbutrin- went over with pt. Buspar TID PRN added.              -antipsychotic agents: N/A 5. Neuropsych: This patient is capable of making decisions on his own behalf. 6. Skin/Wound Care: Routine skin checks 7. Fluids/Electrolytes/Nutrition: Routine in and outs with follow-up chemistries 8.  Permissive hypertension.  Presently on Toprol-XL 125 mg daily.  Monitor with increased mobility.  Patient also on losartan 100 mg daily, Aldactone 25 mg daily, HCTZ 25 mg daily prior to admission.  Resume as needed Vitals:   03/11/21 1337 03/11/21 2042  BP: 119/81 (!) 129/97  Pulse: 92 72  Resp: 20 18  Temp: 97.6 F (36.4 C) 98.5 F (36.9 C)   SpO2: 99% 97%  Aldactone resumed.  Supplement potassium as below 9.  Hyperlipidemia.  Continue Lipitor 10.  AKI.  Renal ultrasound negative.  Repeat Cr tomorrow.  11.  New onset atrial fibrillation.  Continue Eliquis.  Cardiac rate bradycardic reduce metoprolol succinate to 100mg  12.  History of colon cancer with resection in 2009/chemotherapy.  Follow-up outpatient 13.  History of prostate cancer  TURP 2016.  Follow-up outpatient 14. Nausea: Continue Compazine 10mg  q6H prn added 15. Chronic epistaxis: outpatient referral to ENT 16. Hypokalemia: supplement 61meq IV and 60meq oral, repeat K+ tomorrow. Will stick with only oral supplementation from now on given burning with IV potassium 17. Diarrhea: add fiber  LOS: 7 days A FACE TO FACE EVALUATION WAS PERFORMED  Keyondra Lagrand P Tonetta Napoles 03/12/2021, 1:15 PM

## 2021-03-12 NOTE — Progress Notes (Signed)
Patient experiencing anxiety attack wanting to leave AMA at 4pm today after discussion with Charge Nurse he agreed to stay and revisit plans tomorrow requesting his wellbutrin 450mg  given punctually at 0800.Team made aware.

## 2021-03-12 NOTE — Progress Notes (Signed)
Nutrition Follow-up  DOCUMENTATION CODES:   Not applicable  INTERVENTION:  Continue Ensure Enlive po TID, each supplement provides 350 kcal and 20 grams of protein.  Encourage adequate PO intake.  NUTRITION DIAGNOSIS:   Increased nutrient needs related to acute illness (rehab) as evidenced by estimated needs; ongoing  GOAL:   Patient will meet greater than or equal to 90% of their needs; progressing  MONITOR:   PO intake, Supplement acceptance, Labs, Weight trends, I & O's  REASON FOR ASSESSMENT:   Consult Diet education  ASSESSMENT:   75 yo male with a PMH of bowel resection in 2006, anxiety, depression, HTN, prostate cancer, colon cancer, idiopathic peripheral neuropathy, GAD, panic attacks, arthritis, and asthma who presents to rehab after subcortical infarction.  Meal completion has been varied from 25-100%. Pt reports appetite is fine, however reports dislike of hospital foods/meals. Family has been bringing in food from outside/home. Pt with multiple snacks at bedside. Pt currently has Ensure ordered and has been consuming them. RD to continue with current orders to aid in caloric and protein needs.   Labs and medications reviewed.   Diet Order:   Diet Order             Diet Heart Room service appropriate? Yes; Fluid consistency: Thin  Diet effective now                   EDUCATION NEEDS:   Education needs have been addressed  Skin:  Skin Assessment: Reviewed RN Assessment  Last BM:  11/30  Height:   Ht Readings from Last 1 Encounters:  02/27/21 6\' 1"  (1.854 m)    Weight:   Wt Readings from Last 1 Encounters:  03/12/21 84.5 kg   BMI:  Body mass index is 24.58 kg/m.  Estimated Nutritional Needs:   Kcal:  2000-2200  Protein:  110-125 grams  Fluid:  >2 L  Corrin Parker, MS, RD, LDN RD pager number/after hours weekend pager number on Amion.

## 2021-03-12 NOTE — Progress Notes (Signed)
Patient complained of burning at IV site when Potassium was administered via IV pump into right hand. Called pharmacy and was instructed to reduce infusion rate. Reduced infusion rate to 56ml/hr.

## 2021-03-12 NOTE — Progress Notes (Signed)
Physical Therapy Session Note  Patient Details  Name: George [name]. MRN: [id number] Date of Birth: 01-17-1946  Today's Date: 03/12/2021 PT Individual Time: 1415-1510 PT Individual Time Calculation (min): 55 min   Short Term Goals: Week 1:  PT Short Term Goal 1 (Week 1): Pt will complete bed mobility mod I PT Short Term Goal 2 (Week 1): Pt will complete bed<>chair transfers with CGA and LRAD PT Short Term Goal 3 (Week 1): Pt will ambulate 150ft with CGA and LRAD PT Short Term Goal 4 (Week 1): Pt will initiate stair training  Skilled Therapeutic Interventions/Progress Updates:     Pt presents supine in bed with his niece at bedside. Pt agreeable to PT tx but flat affect and short with this therapist - unsure if he recognizes me as his therapist despite consistency during stay. Supine<>sit completed at supervision level with HOB Flat and no bed rails - somewhat impulsive. Sit<>stand to RW with supervision with ataxia in L hemibody. Ambulated with CGA and RW to ortho rehab gym, ~118ft - ataxic gait with LLE catching in swing ~50% of the time. Pt required VC for safety awareness while turning into door frames due to L visual field cut.   Retrieved w/c from utility closet to transport pt outdoors to get fresh air to boost mood and morale (pt wanting to DC home soon, threatening to leave AMA). Pt transported outdoors to reflection fountain - Instructed in gait training where he ambulated ~111ft + ~160ft outdoors with minA and RW - catching LLE ~75% of the time and some difficulty with keeping RW straight due to ataxia. Discussed stroke recovery and possibility of getting grounds pass to allow family to transport outdoors when appropriate - when instructed to not walk with family, pt becoming frustrated and becoming irrational. Wanting to "sign out" when we return upstairs and threatening to leave AMA.   Returned upstairs and then pt agreeable to resume session. Pt instructed in gait training with vs  without RW as it's suspected that pt will have poor compliance with RW at home. Pt required min (sometimes modA) for gait with no RW compared to CGA with RW. With no AD, he navigated ramp and mulch pit with minA with x1 LOB while descending ramp that required maxA for recovery. During rest break, discussed deficits and pt lacks full insight and poor reasoning into LOB's - states that therapist hand on gait belt disrupts gait and that he "normally walks pigeon toed" which is why he scissors.   At this point of session, pt becoming more frustrating and talking about signing himself out AMA. Notified RN of concern who reports she notified PA. Pt ambulated back to his room with CGA/minA and RW. In room, discussed with his Niece current mobility status, barriers to DC, concern for returning home prior to further rehab, role of OP therapies, pt's high falls risk, etc. She voiced understanding and, in private, was in agreement of therapist recommendations and that she will attempt to redirect pt to continue his stay in rehab. Pt supine in bed at end of session with all needs in reach. He missed 20 minutes of skilled therapy due to refusal and increased agitation.   Therapy Documentation Precautions:  Precautions Precautions: Fall Precaution Comments: L inattention Restrictions Weight Bearing Restrictions: No General: PT Amount of Missed Time (min): 20 Minutes PT Missed Treatment Reason: Increased agitation;Patient unwilling to participate  Therapy/Group: Individual Therapy  Alger Simons 03/12/2021, 7:34 AM

## 2021-03-12 NOTE — Patient Care Conference (Signed)
Inpatient RehabilitationTeam Conference and Plan of Care Update Date: 03/11/2021   Time: 11:13 AM    Patient Name: George [name].      Medical Record Number: [id number]  Date of Birth: 1946-01-24 Sex: Male         Room/Bed: 4M04C/4M04C-01 Payor Info: Payor: MEDICARE / Plan: MEDICARE PART A AND B / Product Type: *No Product type* /    Admit Date/Time:  03/05/2021  2:20 PM  Primary Diagnosis:  Subcortical infarction [facility])  Hospital Problems: Principal Problem:   Subcortical infarction [facility])    Expected Discharge Date: Expected Discharge Date: 03/19/21  Team Members Present: Physician leading conference: Dr. Leeroy Cha Social Worker Present: Ovidio Kin, LCSW Nurse Present: Dorien Chihuahua, RN PT Present: Ginnie Smart, PT OT Present: Leretha Pol, OT SLP Present: Lillie Columbia, SLP PPS Coordinator present : Gunnar Fusi, SLP     Current Status/Progress Goal Weekly Team Focus  Bowel/Bladder   Patient continent of bowel and bladder. Last BM 11/29. Episodes of Diarrhea which patient states is uncomfortable to deal with. Requesting intervention  Patient will remain continent of bowel and bladder  Assess toiletting needs Q 2 hours and PRN   Swallow/Nutrition/ Hydration             ADL's   CGA-minA; ataxic with poor [place]; visual spatial defecits; impaired safety awareness  overall supervision  FMC/GMC training, transfer training, self care training   Mobility   Supervision bed mobility, CGA sit<>stand to RW, CGA/minA for transfers with RW, ambulating >161ft with minA and RW. Ataxic in L hemibody, poor postural control/awareness, lacks spatial awareness, L visual field cut, motor planning, mild impulsivity  Supervision  Coordination training, gait training, dynamic standing balance, safety awareness.   Communication             Safety/Cognition/ Behavioral Observations  min A  Mod I  complex cog, problem solving, emergent/anticipatory awareness   Pain   Denies  pain  Patient will remain free from pain  Assess pain Q shit and PRN   Skin   Skin is clean, dry, and intact  Skin will remain free from breakdown  Assess skin Q shift and PRN     Discharge Planning:  Home with wife who is able to provide supervision level-wife here daily and has observed in therapies   Team Discussion: Ongoing epistaxis; MD recommended follow up with [facility] OP. Sundowning with good recovery when wife comes in. Patient is self reported enochlophobic. Willing to have therapist come to his home for follow up services. Hypokalemia addressed and CKD improved. Progress limited by left ataxia, poor fine motor control, spatial awareness deficits and problems with coordination and poor postural control with left field cut.   Patient on target to meet rehab goals: yes, currently supervision to [facility] for sit - stand and able to ambulate up to 150' with min assist. Goals for discharge set for supervision overall.   *See Care Plan and progress notes for long and short-term goals.   Revisions to Treatment Plan:  SLP downgraded goals to supervision level Neuro-psych referral  Teaching Needs: Safety, transfers, toileting, medcations, secondary risk management, etc.   Current Barriers to Discharge: Decreased caregiver support  Possible Resolutions to Barriers: Family education     Medical Summary Current Status: epistaxis, sundowning, hypokalemia, CKD, diarrhea with incontinence, panic attacks, elevated systolic and diastolic BP  Barriers to Discharge: Medical stability  Barriers to Discharge Comments: epistaxis, sundowning, hypokalemia, CKD, diarrhea with incontinence, panic attacks, visual field cut,  elevated systolic and diastolic BP Possible Resolutions to [employer]: will refer for outpatient eval of epistaxis, d/c Paxil, supplementing K+ IV and orally, monitor BP TID, continue toprol, spironolactone   Continued Need for Acute Rehabilitation Level of Care: The patient  requires daily medical management by a physician with specialized training in physical medicine and rehabilitation for the following reasons: Direction of a multidisciplinary physical rehabilitation program to maximize functional independence : Yes Medical management of patient stability for increased activity during participation in an intensive rehabilitation regime.: Yes Analysis of laboratory values and/or radiology reports with any subsequent need for medication adjustment and/or medical intervention. : Yes   I attest that I was present, lead the team conference, and concur with the assessment and plan of the team.   Dorien Chihuahua B 03/12/2021, 12:33 PM

## 2021-03-13 DIAGNOSIS — I639 Cerebral infarction, unspecified: Secondary | ICD-10-CM | POA: Diagnosis not present

## 2021-03-13 LAB — BASIC METABOLIC PANEL
Anion gap: 6 (ref 5–15)
BUN: 30 mg/dL — ABNORMAL HIGH (ref 8–23)
CO2: 22 mmol/L (ref 22–32)
Calcium: 9.6 mg/dL (ref 8.9–10.3)
Chloride: 110 mmol/L (ref 98–111)
Creatinine, Ser: 1.85 mg/dL — ABNORMAL HIGH (ref 0.61–1.24)
GFR, Estimated: 37 mL/min — ABNORMAL LOW (ref >60)
Glucose, Bld: 91 mg/dL (ref 70–99)
Potassium: 4 mmol/L (ref 3.5–5.1)
Sodium: 138 mmol/L (ref 135–145)

## 2021-03-13 MED ORDER — VITAMIN D (ERGOCALCIFEROL) 1.25 MG (50000 UNIT) PO CAPS: 50000.0000 [IU] | ORAL_CAPSULE | ORAL | 0 refills | Status: DC

## 2021-03-13 MED ORDER — METOPROLOL SUCCINATE ER 50 MG PO TB24: 125.0000 mg | ORAL_TABLET | Freq: Every day | ORAL | 0 refills | Status: DC

## 2021-03-13 MED ORDER — CALCIUM POLYCARBOPHIL 625 MG PO TABS: 625.0000 mg | ORAL_TABLET | Freq: Every day | ORAL | 0 refills | Status: DC

## 2021-03-13 MED ORDER — ADULT MULTIVITAMIN W/MINERALS CH: 1.0000 | ORAL_TABLET | Freq: Every day | ORAL | Status: AC

## 2021-03-13 MED ORDER — ATORVASTATIN CALCIUM 80 MG PO TABS: 80.0000 mg | ORAL_TABLET | Freq: Every day | ORAL | 0 refills | Status: DC

## 2021-03-13 MED ORDER — HYDROXYZINE HCL 10 MG PO TABS: 10.0000 mg | ORAL_TABLET | Freq: Four times a day (QID) | ORAL | 0 refills | Status: DC | PRN

## 2021-03-13 MED ORDER — HYDROXYZINE HCL 10 MG PO TABS
10.0000 mg | ORAL_TABLET | Freq: Four times a day (QID) | ORAL | Status: DC | PRN
  Administered 2021-03-13 – 2021-03-14 (×2): 10 mg via ORAL
  Filled 2021-03-13 (×3): qty 1

## 2021-03-13 MED ORDER — APIXABAN 5 MG PO TABS: 5.0000 mg | ORAL_TABLET | Freq: Two times a day (BID) | ORAL | 0 refills | Status: DC

## 2021-03-13 MED ORDER — SPIRONOLACTONE 25 MG PO TABS: 25.0000 mg | ORAL_TABLET | Freq: Every day | ORAL | 0 refills | Status: DC

## 2021-03-13 MED ORDER — METOPROLOL SUCCINATE ER 25 MG PO TB24: 125.0000 mg | ORAL_TABLET | Freq: Every day | ORAL | 0 refills | Status: DC

## 2021-03-13 NOTE — Progress Notes (Signed)
Patient ID: George [name]., male   DOB: Jun 19, 1945, 75 y.o.   MRN: [id number]  Pt requesting to discharge tomorrow and MD in agreement along with team. Wife here to be educated on care needs. Discussed equipment needs-rolling walker and follow up. He is agreeable to home health no pref, feels will fit his life better. Have made referral to [facility] for rolling walker and [facility] for PT, OT and SP follow. Wife aware he is high risk to fall due to CVA and deficits. They also want a PCP and have no preference regarding male or male or PA versus NP. Will work on this and plan for DC tomorrow.

## 2021-03-13 NOTE — Progress Notes (Signed)
Inpatient Rehabilitation Care Coordinator Discharge Note DC SAT 12/3  Patient Details  Name: George [name]. MRN: [id number] Date of Birth: Sep 09, 1945   Discharge location: HOME WITH WIFE WHO CAN PROVIDE 24/7 CARE  Length of Stay:  9 DAYS  Discharge activity level: CGA-MIN LEVEL  Home/community participation: ACTIVE  Patient response PO:EUMPNT Literacy - How often do you need to have someone help you when you read instructions, pamphlets, or other written material from your doctor or pharmacy?: Never  Patient response IR:WERXVQ Isolation - How often do you feel lonely or isolated from those around you?: Sometimes  Services provided included: MD, RD, PT, OT, SLP, RN, CM, TR, Pharmacy, SW  Financial Services:  Financial Services Utilized: Medicare    Choices offered to/list presented to: PT AND WIFE  Follow-up services arranged:  Home Health, Patient/Family has no preference for HH/DME agencies, DME Home Health Agency: [facility]-PT, OT,SP    DME : [facility]-TALL ROLLING WALKER    Patient response to transportation need: Is the patient able to respond to transportation needs?: Yes In the past 12 months, has lack of transportation kept you from medical appointments or from getting medications?: No In the past 12 months, has lack of transportation kept you from meetings, work, or from getting things needed for daily living?: No    Comments (or additional information):PT HAPPIER CAN DISCHARGE SOONER DUE TO HIS ANXIETY ISSUES. WIFE HAS BEEN EDUCATED ON HS CARE NEEDS AND BOTH AWARE IF HAD STAYED [name] LEVEL GOALS, NOW WILL REQUIRE HANDS ON CARE AND BE HIGH RISK TO FALL AT HOME DUE TO SAFETY ISSUES AND IMPULSIVITY  Patient/Family verbalized understanding of follow-up arrangements:  Yes  Individual responsible for coordination of the follow-up plan: George [name]  [phone]  Confirmed correct DME delivered: George [name]  03/13/2021    George [name], George [name]

## 2021-03-13 NOTE — Progress Notes (Signed)
Speech Language Pathology Discharge Summary  Patient Details  Name: George [name]. MRN: [id number] Date of Birth: 1946-03-20  Today's Date: 03/13/2021 SLP Individual Time: 1425-1445 SLP Individual Time Calculation (min): 20 min   Skilled Therapeutic Interventions:  Patient seen with wife present in room for skilled ST session with focus on discharge education in light of new plan for discharge 12/3 instead of initial plan of 12/8. SLP stressed importance of patient and wife working together, being mindful of his deficits, tendency to be impulsive, etc. SLP also strongly recommended patient avoid any maintenance work on his housing properties and wife reported he has Clinical biochemist, Development worker, community, etc he can call do do this. Patient and spouse in agreement with plan and no further questions.     Patient has met 4 of 6 long term goals.  Patient to discharge at overall Supervision;Modified Independent level.  Reasons goals not met: Patient at supervision level for awareness and alternating attention. Discharge date moved up from 12/8 to 12/3 due to patient request.   Clinical Impression/Discharge Summary: Patient met 4 of 6 LTG's during this CIR stay. Initially, discharge had been planned for 12/8 however patient requested earlier discharge and after team discussion, this was approved. Patient was limited by his anxiety and he stated it was very difficult for him to stay in hospital away from his home environment. The two LTG"s he did not meet were in areas of alternating attention and anticipatory awareness. SLP did complete patient and family discharge education with recommendation that he and wife work together as a team, be mindful of his physical and cognitive deficits which could lead to falls and to focus on ADL's in the home but not out in community at this time. Patient and spouse both in agreement with recommendations and had no further questions for SLP. SLP is not recommending [place] services  secondary to anticipated limited compliance from patient.  Care Partner:  Caregiver Able to Provide Assistance: Yes  Type of Caregiver Assistance: Physical;Cognitive  Recommendation:  None      Equipment: none for ST   Reasons for discharge: Discharged from hospital   Patient/Family Agrees with Progress Made and Goals Achieved: Yes    Sonia Baller, MA, CCC-SLP Speech Therapy

## 2021-03-13 NOTE — Progress Notes (Signed)
Inpatient Rehabilitation Discharge Medication Review by a Pharmacist  A complete drug regimen review was completed for this patient to identify any potential clinically significant medication issues.  High Risk Drug Classes Is patient taking? Indication by Medication  Antipsychotic No   Anticoagulant Yes Apixaban - Atrial fibrillation  Antibiotic No   Opioid No   Antiplatelet No   Hypoglycemics/insulin No   Vasoactive Medication No Metoprolol, felodipine, spironolactone - HTN  Chemotherapy No   Other Yes Bupropion, paroxetine, PRN hydroxyzine - anxiety Atorvastatin - HLD     Type of Medication Issue Identified Description of Issue Recommendation(s)  Drug Interaction(s) (clinically significant)     Duplicate Therapy     Allergy     No Medication Administration End Date     Incorrect Dose     Additional Drug Therapy Needed     Significant med changes from prior encounter (inform family/care partners about these prior to discharge).    Other       Clinically significant medication issues were identified that warrant physician communication and completion of prescribed/recommended actions by midnight of the next day:  No   Time spent performing this drug regimen review (minutes): [place], PharmD, [place], AAHIVP, CPP Infectious Disease Pharmacist 03/13/2021 9:42 AM

## 2021-03-13 NOTE — Discharge Summary (Signed)
Physical Therapy Discharge Summary  Patient Details  Name: George [name]. MRN: [id number] Date of Birth: 08/06/1945  Patient has met 1 of 8 long term goals due to improved activity tolerance, improved balance, improved postural control, increased strength, ability to compensate for deficits, and functional use of  left upper extremity and left lower extremity.  Patient to discharge at an ambulatory level  CGA/minA .   Patient's care partner is independent to provide the necessary physical and cognitive assistance at discharge. Family education completed on 03/13/21 with his wife.   Reasons goals not met: Pt wanting to sign himself out AMA. DC date moved without adequate time to upgrade/downgrade goals.  Recommendation:  Patient will benefit from ongoing skilled PT services in home health setting to continue to advance safe functional mobility, address ongoing impairments in ataxia, L visual field cut, L inattention, gait deficits, and minimize fall risk.  Equipment: RW  Reasons for discharge: discharge from hospital. Pt wanting to leave AMA. DC date moved to accommodate.  Patient/family agrees with progress made and goals achieved: Yes  PT Discharge Precautions/Restrictions Precautions Precautions: Fall Precaution Comments: L visual field cut, L inattention, ataxia, poor safety awareness, high anxiety with h/o panic attacks Restrictions Weight Bearing Restrictions: No Pain Pain Assessment Pain Scale: 0-10 Pain Score: 0-No pain Pain Interference Pain Interference Pain Effect on Sleep: 2. Occasionally Pain Interference with Therapy Activities: 2. Occasionally Pain Interference with Day-to-Day Activities: 2. Occasionally Vision/Perception  Vision - History Ability to See in Adequate Light: 1 Impaired Perception Perception: Impaired Inattention/Neglect: Does not attend to left visual field;Does not attend to left side of body Praxis Praxis: Impaired Praxis Impairment Details:  Motor planning;Ideomotor  Cognition Overall Cognitive Status: Impaired/Different from baseline Arousal/Alertness: Awake/alert Orientation Level: Oriented X4 Attention: Selective;Alternating Selective Attention: Impaired Selective Attention Impairment: Verbal complex Alternating Attention: Impaired Alternating Attention Impairment: Verbal complex Memory: Impaired Memory Impairment: Decreased recall of new information;Retrieval deficit Awareness: Impaired Awareness Impairment: Emergent impairment;Anticipatory impairment Problem Solving: Impaired Problem Solving Impairment: Verbal complex;Functional complex;Functional basic Reasoning: Impaired Reasoning Impairment: Verbal complex;Functional complex Behaviors: Impulsive;Poor frustration tolerance;Lability Safety/Judgment: Impaired Sensation Sensation Light Touch: Impaired by gross assessment Peripheral sensation comments: Able to discern light touch but diminished. Reports baseline neuropathy in feet Hot/Cold: Appears Intact Proprioception: Impaired by gross assessment Stereognosis: Not tested Coordination Gross Motor Movements are Fluid and Coordinated: No Fine Motor Movements are Fluid and Coordinated: No Coordination and Movement Description: Lt sided incoordination Finger Nose Finger Test: Ataxic Lt side, WNL Rt side Heel Shin Test: Ataxic L side Motor  Motor Motor: Ataxia;Hemiplegia Motor - Discharge Observations: L side ataxia in UE and LE  Mobility Bed Mobility Bed Mobility: Rolling Right;Rolling Left;Supine to Sit;Sit to Supine Rolling Right: Independent Rolling Left: Independent Supine to Sit: Independent Sit to Supine: Independent Transfers Transfers: Sit to Stand;Stand to Sit;Stand Pivot Transfers Sit to Stand: Supervision/Verbal cueing Stand to Sit: Supervision/Verbal cueing Stand Pivot Transfers: Contact Guard/Touching assist Stand Pivot Transfer Details: Verbal cues for safe use of DME/AE;Verbal cues for  gait pattern;Verbal cues for sequencing;Visual cues/gestures for precautions/safety;Visual cues for safe use of DME/AE;Tactile cues for weight shifting Transfer (Assistive device): Rolling walker Locomotion  Gait Ambulation: Yes Gait Assistance: Minimal Assistance - Patient > 75%;Contact Guard/Touching assist (fluctuates b/w CGA and miNA) Gait Distance (Feet): 200 Feet Assistive device: Rolling walker Gait Assistance Details: Verbal cues for gait pattern;Verbal cues for safe use of DME/AE;Verbal cues for precautions/safety;Verbal cues for technique;Verbal cues for sequencing;Tactile cues for weight shifting;Tactile cues for posture;Manual facilitation for  weight shifting Gait Gait: Yes Gait Pattern: Impaired Gait Pattern: Decreased step length - right;Decreased step length - left;Decreased dorsiflexion - left;Decreased weight shift to left;Poor foot clearance - left;Ataxic;Narrow base of support;Trunk flexed;Step-through pattern Gait velocity: decreased Stairs / Additional Locomotion Stairs: No Wheelchair Mobility Wheelchair Mobility: No  Trunk/Postural Assessment  Cervical Assessment Cervical Assessment: Within Functional Limits Thoracic Assessment Thoracic Assessment: Exceptions to [facility] (rounded shoulders) Lumbar Assessment Lumbar Assessment: Exceptions to [facility] (flexible posterior pelvic tilt) Postural Control Postural Control: Deficits on evaluation Righting Reactions: delayed with poor motor control  Balance Balance Balance Assessed: Yes Static Sitting Balance Static Sitting - Balance Support: Feet supported;No upper extremity supported Static Sitting - Level of Assistance: 7: Independent Dynamic Sitting Balance Dynamic Sitting - Balance Support: During functional activity Dynamic Sitting - Level of Assistance: 5: Stand by assistance Static Standing Balance Static Standing - Balance Support: Bilateral upper extremity supported Static Standing - Level of Assistance: 5: Stand  by assistance Dynamic Standing Balance Dynamic Standing - Balance Support: During functional activity;No upper extremity supported Dynamic Standing - Level of Assistance: 3: Mod assist Extremity Assessment  RLE Assessment RLE Assessment: Within Functional Limits LLE Assessment LLE Assessment: Exceptions to [facility] LLE Strength LLE Overall Strength: Deficits Left Hip Flexion: 3+/5 Left Knee Flexion: 4-/5 Left Knee Extension: 4/5 Left Ankle Dorsiflexion: 4/5    Emylie Amster P Deandrew Hoecker PT, DPT 03/13/2021, 12:25 PM

## 2021-03-13 NOTE — Progress Notes (Signed)
Occupational Therapy Session Note  Patient Details  Name: Orland Visconti. MRN: [id number] Date of Birth: [birth year]  Today's Date: 03/13/2021 OT Individual Time: 0700-0755 OT Individual Time Calculation (min): 55 min    Short Term Goals: Week 1:  OT Short Term Goal 1 (Week 1): STGs=LTGs due to ELOS  Skilled Therapeutic Interventions/Progress Updates:    Pt seated EOB upon arrival eating breakfast with wife present. Functional amb with RW to use bathroom with CGA. Toileting with supervision. Pt amb with RW to gym and engaged in standing activity at hi lo table, assembling pipe tree structure from diagram. Pt required min verbal cues for selection of pipes. Pt used BUE during task while standing. Pt returned to chair and disassembled structure using BUE. Pt amb with RW to chair in front of bounce back trampoline.Pt initially tossed soccer ball against bounce back using BUE 4x10. Pt transitioned to tossing small 1kg ball with LUE and catching with BUE 4x10. Pt amb with RW back to room and sat EOB. Pt remained EOB with bed alarm activated. Wife present. OT intervention with focus on functional amb with RW and BUE functional tasks/therapeutic activities to address ataxia and increase independence with BADLS.   Therapy Documentation Precautions:  Precautions Precautions: Fall Precaution Comments: L inattention Restrictions Weight Bearing Restrictions: No  Pain:  Pt denies pain this morning     Therapy/Group: Individual Therapy  Leroy Libman 03/13/2021, 8:48 AM

## 2021-03-13 NOTE — Progress Notes (Signed)
Physical Therapy Session Note  Patient Details  Name: George [name]. MRN: [id number] Date of Birth: [birth year]  Today's Date: 03/13/2021 PT Individual Time: 0800-0900 PT Individual Time Calculation (min): 60 min   Short Term Goals: Week 1:  PT Short Term Goal 1 (Week 1): Pt will complete bed mobility mod I PT Short Term Goal 2 (Week 1): Pt will complete bed<>chair transfers with CGA and LRAD PT Short Term Goal 3 (Week 1): Pt will ambulate 119ft with CGA and LRAD PT Short Term Goal 4 (Week 1): Pt will initiate stair training  Skilled Therapeutic Interventions/Progress Updates:     Pt greeted seated EOB to start session with nursing at bedside to provide morning medications. Wife also at bedside. Pt agreeable to PT tx without reports of pain. Focused session on family education as pt has been threatening to leave AMA due to anxiety and desire to return home. Lengthy discussion with both pt and wife regarding pt's current mobility status, his high falls risk, deficits related to his CVA, stroke recovery, etc. Wife was more appreciative and receptive to feedback but pt was pleasant and understanding. Limited questions from both of them regarding DC planning, home safety, f/u therapies, etc. Wife present for active observation during remainder of session. Pt completed sit<>stand to RW with supervision. Ambulated ~185ft with CGA and RW on level surfaces - gait ataxic but controlled with a few LOB while turning L that was self corrected. Next, instructed pt in ambulation with no AD where he ambulated ~268ft, requiring minA, significant ataxia with scissoring and inconsistent step lengths on L. Discussed with wife and pt the importance of using RW at home to reduce his falls risk and they voiced understanding. Pt completed car transfer at [facility] level with car height simulating their [place]. Pt then instructed in fall recovery and we reviewed floor transfers which he completed with supervision. Discussed  importance of carrying cell phone to call for help in the event that fall occurs, as well as assessing his body for injuries. We completed session with focusing on dynamic standing balance with dual-task overlay for "clock" colored LE toe taps, depending on PT instruction. Pt completed with no UE support, required minA and sometimes modA for LOB's. Pt ambulated back to his room with close supervision and RW, ~134ft. Remained seated EOB at conclusion of session with bed alarm on, wife at bedside, all needs in reach. Encouraged pt to continue his rehab prior to returning home sooner which he voiced understanding.    Therapy Documentation Precautions:  Precautions Precautions: Fall Precaution Comments: L inattention Restrictions Weight Bearing Restrictions: No General:     Therapy/Group: Individual Therapy  Alger Simons 03/13/2021, 7:34 AM

## 2021-03-13 NOTE — Progress Notes (Signed)
Occupational Therapy Discharge Summary  Patient Details  Name: Izzak Fries. MRN: [id number] Date of Birth: July 05, 1945  Today's Date: 03/13/2021 OT Individual Time: 1000-1110 OT Individual Time Calculation (min): 70 min    Patient has met 8 of 9 long term goals due to improved activity tolerance, improved balance, ability to compensate for deficits, and functional use of  LEFT upper and LEFT lower extremity.  Patient to discharge at overall Supervision (CGA) level.  Patient's care partner is independent to provide the necessary physical and cognitive assistance at discharge.    Reasons goals not met: Pt requires supervision for UB dressing due to impulsivity and poor safety awareness needing cues to complete in seated position.   Recommendation:  Patient will benefit from ongoing skilled OT services in home health setting to continue to advance functional skills in the area of BADL and iADL.  Equipment: RW  Reasons for discharge: discharge from hospital per pt request strongly desiring to continue therapy in home setting and wife available and able to provide necessary contact guard/24 hour supervision.  Skilled Intervention:  Pt semi reclined in bed, wife present to participate in family education.  Pts wife confirmed that plan is to install grab bars in shower and next to toilet as well as install shower bench to increase safety.  Wife reports that she has been a caretaker for two other family members recently who have had history of CVA and feels comfortable providing 24 hours supervision contact guard for her husband. Pt and wife requesting to shower with therapist present to assess safety and independence.  Wife showed excellent safety awareness and provided cues as needed and also appropriate CGA during all functional mobility and self care including ambulation to and from bathroom, shower bench transfer, UB/LB bathing and UB/LB dressing at EOB.  Provided only occasional cues for  education on use of towels to dry floor and remove to reduce trip hazard and use of gait belt during mobility with good teach back demonstrated throughout.  Call bell in reach at end of session.  Patient/family agrees with progress made and goals achieved: Yes  OT Discharge Precautions/Restrictions  Precautions Precautions: Fall Precaution Comments: L visual field cut, L inattention, ataxia, poor safety awareness, high anxiety with h/o panic attacks Restrictions Weight Bearing Restrictions: No Pain Pain Assessment Pain Scale: 0-10 Pain Score: 0-No pain ADL ADL Eating: Supervision/safety Where Assessed-Eating: Chair Grooming: Supervision/safety Where Assessed-Grooming: Sitting at sink Upper Body Bathing: Supervision/safety Where Assessed-Upper Body Bathing: Shower (sitting on shower bench) Lower Body Bathing: Contact guard Where Assessed-Lower Body Bathing: Shower (sitting on shower bench) Upper Body Dressing: Supervision/safety Where Assessed-Upper Body Dressing: Edge of bed Lower Body Dressing: Contact guard Where Assessed-Lower Body Dressing: Edge of bed Toileting: Contact guard Where Assessed-Toileting: Glass blower/designer: Therapist, music Method: Counselling psychologist: Energy manager: Curator Method: Heritage manager: Gaffer Baseline Vision/History: 1 Wears glasses Patient Visual Report: Blurring of vision Vision Assessment?: Yes Eye Alignment: Impaired (comment) Convergence: Impaired (comment) Visual Fields: Impaired-to be further tested in functional context;Left visual field deficit Perception  Perception: Impaired Inattention/Neglect: Does not attend to left visual field;Does not attend to left side of body Praxis Praxis: Impaired Praxis Impairment Details: Motor planning;Ideomotor Cognition Overall Cognitive Status: Impaired/Different from  baseline Arousal/Alertness: Awake/alert Orientation Level: Oriented X4 Year: 2022 Month: December Day of Week: Correct Attention: Selective;Alternating Selective Attention: Impaired Selective Attention Impairment: Verbal complex;Functional complex Alternating Attention: Impaired Alternating  Attention Impairment: Verbal complex;Functional complex Memory: Impaired Memory Impairment: Decreased recall of new information;Retrieval deficit Immediate Memory Recall: Sock;Blue;Bed Memory Recall Sock: Without Cue Memory Recall Blue: Without Cue Memory Recall Bed: Without Cue Awareness: Impaired Awareness Impairment: Emergent impairment;Anticipatory impairment Problem Solving: Impaired Problem Solving Impairment: Verbal complex;Functional complex;Functional basic Executive Function: Self Monitoring;Reasoning Reasoning: Impaired Reasoning Impairment: Verbal complex;Functional complex Self Monitoring: Impaired Self Monitoring Impairment: Verbal complex;Functional complex;Functional basic Behaviors: Impulsive;Poor frustration tolerance;Lability Safety/Judgment: Impaired Sensation Sensation Light Touch: Impaired by gross assessment Peripheral sensation comments: Able to discern light touch but diminished. Reports baseline neuropathy in feet Light Touch Impaired Details: Impaired RUE;Impaired LUE;Impaired RLE;Impaired LLE Hot/Cold: Appears Intact Proprioception: Impaired by gross assessment Stereognosis: Not tested Coordination Gross Motor Movements are Fluid and Coordinated: No Fine Motor Movements are Fluid and Coordinated: No Coordination and Movement Description: Lt sided incoordination Finger Nose Finger Test: Ataxic Lt side, WNL Rt side Heel Shin Test: Ataxic L side Motor  Motor Motor: Ataxia;Hemiplegia Motor - Discharge Observations: L side ataxia in UE and LE Mobility  Bed Mobility Bed Mobility: Rolling Right;Rolling Left;Supine to Sit;Sit to Supine Rolling Right:  Independent Rolling Left: Independent Supine to Sit: Independent Sit to Supine: Independent Transfers Sit to Stand: Supervision/Verbal cueing Stand to Sit: Supervision/Verbal cueing  Trunk/Postural Assessment  Cervical Assessment Cervical Assessment: Within Functional Limits Thoracic Assessment Thoracic Assessment: Exceptions to [facility] Lumbar Assessment Lumbar Assessment: Exceptions to [facility] Postural Control Postural Control: Deficits on evaluation Righting Reactions: delayed with poor motor control  Balance Balance Balance Assessed: Yes Static Sitting Balance Static Sitting - Balance Support: Feet supported;No upper extremity supported Static Sitting - Level of Assistance: 7: Independent Dynamic Sitting Balance Dynamic Sitting - Balance Support: During functional activity Dynamic Sitting - Level of Assistance: 5: Stand by assistance Static Standing Balance Static Standing - Balance Support: Bilateral upper extremity supported Static Standing - Level of Assistance: 5: Stand by assistance Dynamic Standing Balance Dynamic Standing - Balance Support: During functional activity;No upper extremity supported Dynamic Standing - Level of Assistance: 3: Mod assist Extremity/Trunk Assessment RUE Assessment RUE Assessment: Within Functional Limits LUE Assessment LUE Assessment: Within Functional Limits LUE Body System: Neuro Brunstrum levels for arm and hand: Arm;Hand Brunstrum level for arm:  (ataxic) Brunstrum level for hand: Stage V Independence from basic synergies (Impaired [place])   Violette Morneault L Manuela Halbur 03/13/2021, 2:18 PM

## 2021-03-13 NOTE — Progress Notes (Signed)
PROGRESS NOTE   Subjective/Complaints: Seen walking well in hallway with RW with [place] today. He would really like to go home and feels psychological stress of being here outweighs physical benefits of more therapy  ROS: -epistaxis., + diarrhea, +fidgeting, +panic attacks, +burning with IV potassium on 12/1, denies chest pain   Objective:   No results found. No results for input(s): WBC, HGB, HCT, PLT in the last 72 hours.  Recent Labs    03/12/21 0530 03/13/21 0607  NA 138 138  K 3.4* 4.0  CL 108 110  CO2 22 22  GLUCOSE 96 91  BUN 22 30*  CREATININE 1.81* 1.85*  CALCIUM 9.2 9.6    Intake/Output Summary (Last 24 hours) at 03/13/2021 1222 Last data filed at 03/13/2021 0740 Gross per 24 hour  Intake 600 ml  Output --  Net 600 ml        Physical Exam: Vital Signs Blood pressure (!) 147/89, pulse (!) 54, temperature 98.4 F (36.9 C), temperature source Oral, resp. rate 18, weight 84.5 kg, SpO2 100 %.  General: No acute distress, lying down in bed comfortably Mood and affect are appropriate Heart: Bradycardic, elevated diastolic BP Lungs: Clear to auscultation, breathing unlabored, no rales or wheezes Abdomen: Positive bowel sounds, soft nontender to palpation, nondistended Extremities: No clubbing, cyanosis, or edema Skin: No evidence of breakdown, no evidence of rash   Musculoskeletal:     Cervical back: Normal range of motion. No rigidity.     Comments: R side 5/5 in RUE/RLE LUE- 4+/5 in Biceps, triceps, WE, grip and FA LLE- 5-/5 in HF/KE/DF and PF  Skin:    General: Skin is warm and dry.  Neurological:     Mental Status: He is alert.     Comments: Patient is alert.  Makes eye contact with examiner with noted left hemianopsia.  Follows simple commands.  Provides name and age some delay in processing. Intact to light touch in all 4 extremities Finger to nose searching pattern on L side Coordination  impaired on L side- touching all fingers with thumb L pronator drift- mild to moderate  Psychiatric:     Comments: Slightly anxious c/o panic attacks    Assessment/Plan: 1. Functional deficits which require 3+ hours per day of interdisciplinary therapy in a comprehensive inpatient rehab setting. Physiatrist is providing close team supervision and 24 hour management of active medical problems listed below. Physiatrist and rehab team continue to assess barriers to discharge/monitor patient progress toward functional and medical goals  Care Tool:  Bathing    Body parts bathed by patient: Right arm, Left arm, Chest, Front perineal area, Abdomen, Buttocks, Right upper leg, Left upper leg, Right lower leg, Left lower leg, Face         Bathing assist Assist Level: Set up assist     Upper Body Dressing/Undressing Upper body dressing   What is the patient wearing?: Pull over shirt    Upper body assist Assist Level: Set up assist    Lower Body Dressing/Undressing Lower body dressing      What is the patient wearing?: Pants     Lower body assist Assist for lower body dressing: Set up  assist     Toileting Toileting Toileting Activity did not occur (Clothing management and hygiene only): N/A (no void or bm)  Toileting assist Assist for toileting: Supervision/Verbal cueing Assistive Device Comment: walker   Transfers Chair/bed transfer  Transfers assist     Chair/bed transfer assist level: Minimal Assistance - Patient > 75% Chair/bed transfer assistive device: Programmer, multimedia   Ambulation assist      Assist level: Minimal Assistance - Patient > 75% Assistive device: Walker-rolling Max distance: 200'   Walk 10 feet activity   Assist     Assist level: Minimal Assistance - Patient > 75% Assistive device: Walker-rolling   Walk 50 feet activity   Assist    Assist level: Minimal Assistance - Patient > 75% Assistive device: Walker-rolling     Walk 150 feet activity   Assist Walk 150 feet activity did not occur: Safety/medical concerns  Assist level: Minimal Assistance - Patient > 75% Assistive device: Walker-rolling    Walk 10 feet on uneven surface  activity   Assist Walk 10 feet on uneven surfaces activity did not occur: Safety/medical concerns         Wheelchair     Assist Is the patient using a wheelchair?: No   Wheelchair activity did not occur: N/A         Wheelchair 50 feet with 2 turns activity    Assist    Wheelchair 50 feet with 2 turns activity did not occur: N/A       Wheelchair 150 feet activity     Assist  Wheelchair 150 feet activity did not occur: N/A       Blood pressure (!) 147/89, pulse (!) 54, temperature 98.4 F (36.9 C), temperature source Oral, resp. rate 18, weight 84.5 kg, SpO2 100 %.   Medical Problem List and Plan: 1.  Left-sided weakness functional deficits secondary to subacute right PCA infarct secondary new onset atrial fibrillation             -patient may  shower             -ELOS/Goals: 13-15 days supervision  Continue CIR, d/c tomorrow 12/3 2.  Antithrombotics: -DVT/anticoagulation:  Pharmaceutical: Other (comment) Eliquis             -antiplatelet therapy: N/A 3. Pain: N/A. D/c hydrocodone 4. Panic attacks: Continue Wellbutrin 450 mg daily             -stopped paxil due to mild psychomotor agitation , since can mix with Wellbutrin- went over with pt. d/c Buspar              -antipsychotic agents: N/A 5. Neuropsych: This patient is capable of making decisions on his own behalf. 6. Skin/Wound Care: Routine skin checks 7. Fluids/Electrolytes/Nutrition: Routine in and outs with follow-up chemistries 8.  Permissive hypertension.  Continue Toprol-XL 125 mg daily.  Monitor with increased mobility.  Patient also on losartan 100 mg daily, Aldactone 25 mg daily, HCTZ 25 mg daily prior to admission.  Resume as needed Vitals:   03/12/21 1939 03/13/21  0415  BP: 126/83 (!) 147/89  Pulse: (!) 58 (!) 54  Resp: 16 18  Temp: 97.7 F (36.5 C) 98.4 F (36.9 C)  SpO2: 98% 100%  Aldactone resumed.  Supplement potassium as below 9.  Hyperlipidemia.  Continue Lipitor 10.  AKI.  Renal ultrasound negative.  Repeat Cr tomorrow.  11.  New onset atrial fibrillation.  Continue Eliquis.  Cardiac rate bradycardic reduce metoprolol succinate  to 100mg  12.  History of colon cancer with resection in 2009/chemotherapy.  Follow-up outpatient 13.  History of prostate cancer TURP 2016.  Follow-up outpatient 14. Nausea: Continue Compazine 10mg  q6H prn added 15. Chronic epistaxis: outpatient referral to ENT 16. Hypokalemia: supplemented, now normal, discussed high potassium foods for home 17. Diarrhea: add fiber  LOS: 8 days A FACE TO FACE EVALUATION WAS PERFORMED  George [name] 03/13/2021, 12:22 PM

## 2021-03-14 DIAGNOSIS — I69354 Hemiplegia and hemiparesis following cerebral infarction affecting left non-dominant side: Principal | ICD-10-CM

## 2021-03-14 DIAGNOSIS — N179 Acute kidney failure, unspecified: Secondary | ICD-10-CM

## 2021-03-14 DIAGNOSIS — I639 Cerebral infarction, unspecified: Secondary | ICD-10-CM | POA: Diagnosis not present

## 2021-03-14 DIAGNOSIS — I10 Essential (primary) hypertension: Secondary | ICD-10-CM

## 2021-03-14 DIAGNOSIS — E785 Hyperlipidemia, unspecified: Secondary | ICD-10-CM | POA: Diagnosis not present

## 2021-03-14 NOTE — Progress Notes (Signed)
Family/patient has no questions regarding discharge instructions. Belongings gathered. Pt discharged per wheelchair to private vehicle. All lines removed. No complications noted. Sheela Stack, LPN

## 2021-03-14 NOTE — Progress Notes (Signed)
PROGRESS NOTE   Subjective/Complaints: Patient seen sitting up in bed this morning.  He states he slept well overnight.  He does state he has some anxiety this morning, but is ready for discharge.  Family at bedside..  ROS: Denies CP, SOB, N/V/D  Objective:   No results found. No results for input(s): WBC, HGB, HCT, PLT in the last 72 hours.  Recent Labs    03/12/21 0530 03/13/21 0607  NA 138 138  K 3.4* 4.0  CL 108 110  CO2 22 22  GLUCOSE 96 91  BUN 22 30*  CREATININE 1.81* 1.85*  CALCIUM 9.2 9.6     Intake/Output Summary (Last 24 hours) at 03/14/2021 0959 Last data filed at 03/14/2021 0810 Gross per 24 hour  Intake 660 ml  Output --  Net 660 ml         Physical Exam: Vital Signs Blood pressure (!) 140/93, pulse (!) 56, temperature 97.9 F (36.6 C), resp. rate 18, weight 84.5 kg, SpO2 100 %. Constitutional: No distress . Vital signs reviewed. HENT: Normocephalic.  Atraumatic. Eyes: EOMI. No discharge. Cardiovascular: No JVD.  RRR. Respiratory: Normal effort.  No stridor.  Bilateral clear to auscultation. GI: Non-distended.  BS +. Skin: Warm and dry.  Intact. Psych: Normal mood.  Normal behavior. Musc: No edema in extremities.  No tenderness in extremities. Neuro: Alert Motor: Slight weakness on left side  Assessment/Plan: 1. Functional deficits which require 3+ hours per day of interdisciplinary therapy in a comprehensive inpatient rehab setting. Physiatrist is providing close team supervision and 24 hour management of active medical problems listed below. Physiatrist and rehab team continue to assess barriers to discharge/monitor patient progress toward functional and medical goals  Care Tool:  Bathing    Body parts bathed by patient: Right arm, Left arm, Chest, Front perineal area, Abdomen, Buttocks, Right upper leg, Left upper leg, Right lower leg, Left lower leg, Face         Bathing assist  Assist Level: Contact Guard/Touching assist     Upper Body Dressing/Undressing Upper body dressing   What is the patient wearing?: Pull over shirt    Upper body assist Assist Level: Supervision/Verbal cueing    Lower Body Dressing/Undressing Lower body dressing      What is the patient wearing?: Pants     Lower body assist Assist for lower body dressing: Contact Guard/Touching assist     Toileting Toileting Toileting Activity did not occur (Clothing management and hygiene only): N/A (no void or bm)  Toileting assist Assist for toileting: Contact Guard/Touching assist Assistive Device Comment: walker   Transfers Chair/bed transfer  Transfers assist     Chair/bed transfer assist level: Contact Guard/Touching assist Chair/bed transfer assistive device: Programmer, multimedia   Ambulation assist      Assist level: Contact Guard/Touching assist Assistive device: Walker-rolling Max distance: 200'   Walk 10 feet activity   Assist     Assist level: Contact Guard/Touching assist Assistive device: Walker-rolling   Walk 50 feet activity   Assist    Assist level: Contact Guard/Touching assist Assistive device: Walker-rolling    Walk 150 feet activity   Assist Walk 150 feet  activity did not occur: Safety/medical concerns  Assist level: Contact Guard/Touching assist Assistive device: Walker-rolling    Walk 10 feet on uneven surface  activity   Assist Walk 10 feet on uneven surfaces activity did not occur: Safety/medical concerns   Assist level: Minimal Assistance - Patient > 75% Assistive device: Walker-rolling   Wheelchair     Assist Is the patient using a wheelchair?: No   Wheelchair activity did not occur: N/A         Wheelchair 50 feet with 2 turns activity    Assist    Wheelchair 50 feet with 2 turns activity did not occur: N/A       Wheelchair 150 feet activity     Assist  Wheelchair 150 feet activity  did not occur: N/A       Blood pressure (!) 140/93, pulse (!) 56, temperature 97.9 F (36.6 C), resp. rate 18, weight 84.5 kg, SpO2 100 %.   Medical Problem List and Plan: 1.  Left-sided hemiparesis functional deficits secondary to subacute right PCA infarct secondary new onset atrial fibrillation  DC today  Patient to follow-up for transitional care management in 1-2 weeks with provider 2.  Antithrombotics: -DVT/anticoagulation:  Pharmaceutical: Other (comment) Eliquis             -antiplatelet therapy: N/A 3. Pain: N/A. D/c hydrocodone 4. Panic attacks: Continue Wellbutrin 450 mg daily             -stopped paxil due to mild psychomotor agitation , since can mix with Wellbutrin- went over with pt. d/c Buspar              -antipsychotic agents: N/A 5. Neuropsych: This patient is capable of making decisions on his own behalf. 6. Skin/Wound Care: Routine skin checks 7. Fluids/Electrolytes/Nutrition: Routine in and outs 8.  Hypertension.  Continue Toprol-XL 125 mg daily.  Monitor with increased mobility.  Patient also on losartan 100 mg daily, Aldactone 25 mg daily, HCTZ 25 mg daily prior to admission.  Resume as needed Vitals:   03/14/21 0503 03/14/21 0802  BP: (!) 140/93   Pulse: (!) 52 (!) 56  Resp: 18   Temp: 97.9 F (36.6 C)   SpO2: 100%   Aldactone resumed.  Supplement potassium as below  Relatively controlled on 12/3 9.  Hyperlipidemia.  Continue Lipitor 10.  AKI.  Renal ultrasound negative.    Creatinine 1.85 on 12/2, monitor in outpatient setting 11.  New onset atrial fibrillation.  Continue Eliquis.  Cardiac rate bradycardic reduce metoprolol succinate to 100mg  12.  History of colon cancer with resection in 2009/chemotherapy.  Follow-up outpatient 13.  History of prostate cancer TURP 2016.  Follow-up outpatient 14. Nausea: Continue Compazine 10mg  q6H prn added 15. Chronic epistaxis: outpatient referral to ENT 16. Hypokalemia: supplemented, now normal, discussed high  potassium foods for home 17. Diarrhea: add fiber  LOS: 9 days A FACE TO FACE EVALUATION WAS PERFORMED  [name] Lorie Phenix 03/14/2021, 9:59 AM

## 2021-03-15 NOTE — Discharge Summary (Signed)
Physician Discharge Summary  Patient ID: George [name]. MRN: [id number] DOB/AGE: 06/13/1945 75 y.o.  Admit date: 03/05/2021 Discharge date: 03/14/2021  Discharge Diagnoses:  Principal Problem:   Subcortical infarction [facility]) Active Problems:   AKI (acute kidney injury) ([place])   Dyslipidemia   Essential hypertension   Hemiparesis affecting left side as late effect of stroke (HCC) Panic attacks Hyperlipidemia New onset atrial fibrillation History of colon cancer History of prostate cancer Chronic epistaxis  Discharged Condition: Stable  Significant Diagnostic Studies: CT ANGIO HEAD NECK W WO CM  Result Date: 02/27/2021 CLINICAL DATA:  Weakness on left side, headache EXAM: CT ANGIOGRAPHY HEAD AND NECK TECHNIQUE: Multidetector CT imaging of the head and neck was performed using the standard protocol during bolus administration of intravenous contrast. Multiplanar CT image reconstructions and MIPs were obtained to evaluate the vascular anatomy. Carotid stenosis measurements (when applicable) are obtained utilizing NASCET criteria, using the distal internal carotid diameter as the denominator. CONTRAST:  61mL OMNIPAQUE IOHEXOL 350 MG/ML SOLN COMPARISON:  CT head 02/17/2009 FINDINGS: CT HEAD FINDINGS Brain: There is confluent hypodensity throughout the right PCA distribution including the thalamus consistent with subacute infarct. There are small foci of hyperdensity within the infarct territory suspicious for petechial hemorrhage. There is no other evidence of infarct or acute hemorrhage. There is no acute extra-axial fluid collection. There is mild parenchymal volume loss. The ventricles are not enlarged. There is a remote infarct a remote infarct in the right superior parietal lobule. There is no solid mass lesion.  There is no midline shift. Vascular: See below. Skull: Normal. Negative for fracture or focal lesion. Sinuses: The imaged paranasal sinuses are clear. Orbits: Metallic density  anterior to the right globe is unchanged since 2010. The globes and orbits are otherwise unremarkable. Review of the MIP images confirms the above findings CTA NECK FINDINGS Aortic arch: Standard branching. Imaged portion shows no evidence of aneurysm or dissection. No significant stenosis of the major arch vessel origins. Right carotid system: The right common, internal, and external carotid arteries are patent, without hemodynamically significant stenosis, occlusion, dissection, or aneurysm. Left carotid system: The left common, internal, and external carotid arteries are patent, without hemodynamically significant stenosis, occlusion, dissection, or aneurysm. Vertebral arteries: The vertebral arteries are patent, without hemodynamically significant stenosis, occlusion, dissection, or aneurysm. Skeleton: There is multilevel degenerative change of the cervical spine, most advanced at C7-T1. There is no acute osseous abnormality or aggressive osseous lesion. Other neck: The soft tissues of the neck are unremarkable. Upper chest: The imaged lung apices are clear. Review of the MIP images confirms the above findings CTA HEAD FINDINGS Anterior circulation: The intracranial internal carotid arteries are patent. The bilateral ACAs and MCAs are patent. There is no aneurysm. Posterior circulation: The bilateral V4 segments are patent. The basilar artery is patent. The right P1 and proximal P2 segments are patent. There is possible focal occlusion of a P2 branch at the level of the ambient cistern, though evaluation is made difficult by adjacent enhancing venous structures. The PCA is identified within the occipital lobe hypodensity (15-92). The left PCA is patent. The posterior communicating arteries are not identified. Venous sinuses: Patent. Anatomic variants: As above. Review of the MIP images confirms the above findings IMPRESSION: 1. Subacute right PCA territorial infarct with probable small foci of petechial  hemorrhage. 2. Possible focal occlusion of a P2 branch at the level of the ambient cistern, though the PCA is identified within the hypodense right occipital lobe. 3. Otherwise, patent vasculature  of the head and neck with no significant atherosclerotic disease. 4. Small remote infarct in the right parietal lobe. These results were called by telephone at the time of interpretation on 02/27/2021 at 12:00 pm to provider Wynona Dove , who verbally acknowledged these results. Electronically Signed   By: Valetta Mole M.D.   On: 02/27/2021 12:05   US RENAL  Result Date: 03/01/2021 CLINICAL DATA:  AK I EXAM: RENAL / URINARY TRACT ULTRASOUND COMPLETE COMPARISON:  Renal ultrasound 05/27/2020 FINDINGS: Right Kidney: Renal measurements: 10.0 x 5.0 x 4.2 cm = volume: 108 mL. Echogenicity within normal limits. No mass or hydronephrosis visualized. Left Kidney: Renal measurements: 9.2 x 4.9 x 5.2 cm = volume: 123 mL. Echogenicity within normal limits. No mass or hydronephrosis visualized. Bladder: Appears normal for degree of bladder distention. Other: None. IMPRESSION: Unremarkable sonographic appearance of the bilateral kidneys. Electronically Signed   By: Audie Pinto M.D.   On: 03/01/2021 11:28   DG Chest Port 1 View  Result Date: 02/27/2021 CLINICAL DATA:  Stroke symptoms EXAM: PORTABLE CHEST 1 VIEW COMPARISON:  Chest radiograph 04/16/2019 FINDINGS: The heart is at the upper limits of normal for size, exaggerated by AP technique. There is unchanged slight asymmetric elevation of the right hemidiaphragm. There is no focal consolidation or pulmonary edema. There is no pleural effusion or pneumothorax. There is no acute osseous abnormality. IMPRESSION: No radiographic evidence of acute cardiopulmonary process. Electronically Signed   By: Valetta Mole M.D.   On: 02/27/2021 10:32   ECHOCARDIOGRAM COMPLETE  Result Date: 02/28/2021    ECHOCARDIOGRAM REPORT   Patient Name:   George [name]. Date of Exam:  02/28/2021 Medical Rec #:  [id number]         Height:       73.0 in Accession #:    [id number]        Weight:       196.6 lb Date of Birth:  [birth year]         BSA:          2.136 m Patient Age:    5 years          BP:           143/93 mmHg Patient Gender: M                 HR:           89 bpm. Exam Location:  Inpatient Procedure: 2D Echo Indications:    stroke  History:        Patient has no prior history of Echocardiogram examinations.                 Arrythmias:Atrial Fibrillation.  Sonographer:    Johny Chess RDCS Referring Phys: [id number] [place]  1. Pt in atrial fibrillation at time of study.  2. Left ventricular ejection fraction, by estimation, is 60 to 65%. The left ventricle has normal function. The left ventricle has no regional wall motion abnormalities. Left ventricular diastolic function could not be evaluated.  3. Right ventricular systolic function is normal. The right ventricular size is normal.  4. The mitral valve is normal in structure. Trivial mitral valve regurgitation. No evidence of mitral stenosis.  5. The aortic valve is abnormal. Aortic valve regurgitation is not visualized. Aortic valve sclerosis is present, with no evidence of aortic valve stenosis. Comparison(s): No prior Echocardiogram. FINDINGS  Left Ventricle: Left ventricular ejection fraction, by estimation, is 60 to 65%. The left ventricle has  normal function. The left ventricle has no regional wall motion abnormalities. The left ventricular internal cavity size was normal in size. There is  no left ventricular hypertrophy. Left ventricular diastolic function could not be evaluated due to atrial fibrillation. Left ventricular diastolic function could not be evaluated. Right Ventricle: The right ventricular size is normal. Right ventricular systolic function is normal. Left Atrium: Left atrial size was normal in size. Right Atrium: Right atrial size was normal in size. Pericardium: There is no evidence of  pericardial effusion. Mitral Valve: The mitral valve is normal in structure. Trivial mitral valve regurgitation. No evidence of mitral valve stenosis. Tricuspid Valve: The tricuspid valve is normal in structure. Tricuspid valve regurgitation is mild . No evidence of tricuspid stenosis. Aortic Valve: The aortic valve is abnormal. Aortic valve regurgitation is not visualized. Aortic valve sclerosis is present, with no evidence of aortic valve stenosis. Pulmonic Valve: The pulmonic valve was normal in structure. Pulmonic valve regurgitation is trivial. No evidence of pulmonic stenosis. Aorta: The aortic root is normal in size and structure. Venous: The inferior vena cava was not well visualized. IAS/Shunts: The interatrial septum was not well visualized. Additional Comments: Pt in atrial fibrillation at time of study.  LEFT VENTRICLE PLAX 2D LVIDd:         4.80 cm LVIDs:         3.00 cm LV PW:         0.90 cm LV IVS:        0.80 cm LVOT diam:     1.90 cm LVOT Area:     2.84 cm  RIGHT VENTRICLE RV S prime:     10.80 cm/s TAPSE (M-mode): 1.8 cm LEFT ATRIUM             Index        RIGHT ATRIUM           Index LA diam:        4.00 cm 1.87 cm/m   RA Area:     13.50 cm LA Vol (A2C):   55.3 ml 25.89 ml/m  RA Volume:   31.40 ml  14.70 ml/m LA Vol (A4C):   53.8 ml 25.18 ml/m LA Biplane Vol: 54.6 ml 25.56 ml/m   AORTA Ao Root diam: 2.90 cm Ao Asc diam:  3.40 cm TRICUSPID VALVE TR Peak grad:   18.1 mmHg TR Vmax:        213.00 cm/s  SHUNTS Systemic Diam: 1.90 cm Kirk Ruths MD Electronically signed by Kirk Ruths MD Signature Date/Time: 02/28/2021/4:19:34 PM    Final     Labs:  Basic Metabolic Panel: Recent Labs  Lab 03/09/21 0535 03/10/21 0507 03/11/21 0921 03/12/21 0530 03/13/21 0607  NA 138 136 138 138 138  K 3.1* 3.3* 4.1 3.4* 4.0  CL 110 109 107 108 110  CO2 22 22 21* 22 22  GLUCOSE 90 88 126* 96 91  BUN 15 16 15 22  30*  CREATININE 1.78* 1.75* 1.88* 1.81* 1.85*  CALCIUM 8.7* 8.7* 9.3 9.2 9.6     CBC: Recent Labs  Lab 03/09/21 0535  WBC 9.3  HGB 10.5*  HCT 32.6*  MCV 89.1  PLT 378    CBG: No results for input(s): GLUCAP in the last 168 hours.  Family history.  Father with CVA Brother with neoplasm of brain.  Denies any colon cancer esophageal cancer or rectal cancer  Brief HPI:   George [name]. is a 75 y.o. right-handed male with history of colon  cancer status postresection 2009 with chemotherapy, chemotherapy-induced peripheral neuropathy prostate cancer with TURP 2016 anxiety depression with panic attacks, hypertension, quit smoking 48 years ago.  Per chart review lives with spouse.  Independent prior to admission.  Presented 02/27/2021 with acute onset of left-sided weakness as well as headache.  CT angiogram head and neck showed subacute right PCA territory infarction with probable small foci of petechial hemorrhage.  Possible focal occlusion of P2 branch at the level of the ambient cistern.  Small remote infarct right parietal lobe.  Patient did not receive tPA.  Admission chemistries unremarkable except creatinine 1.63-2.05 potassium 3.2 troponin 27.  Patient subsequently developed atrial fibrillation with RVR started on metoprolol as well as Cardizem drip.  Echocardiogram with ejection fraction of 60 to 65% no wall motion abnormalities.  Neurology follow-up maintained on Eliquis for CVA as well as atrial fibrillation.  AKI with creatinine 1.63 renal ultrasound negative for hydronephrosis initially maintained on IV fluids latest creatinine 1.64.  Therapy evaluations completed due to patient's left-sided weakness decreased functional mobility was admitted for a comprehensive rehab program.   Hospital Course: George [name]. was admitted to rehab 03/05/2021 for inpatient therapies to consist of PT, ST and OT at least three hours five days a week. Past admission physiatrist, therapy team and rehab RN have worked together to provide customized collaborative inpatient rehab.   Pertaining to patient's subacute right PCA infarction new onset atrial fibrillation.  Patient remained on Eliquis for both CVA prophylaxis atrial fibrillation and would follow-up neurology services.  Cardiac rate controlled.  History of anxiety panic attacks maintained on Wellbutrin as directed initially on Paxil discontinued due to mild psychomotor agitation.  He was using Atarax as needed for anxiety.  Psychiatry services did follow for his anxiety with panic attacks.  Blood pressure controlled on Toprol as well as Aldactone.  Lipitor ongoing for hyperlipidemia.  AKI renal ultrasound negative latest creatinine 1.85 follow-up outpatient.  History of colon cancer as well as prostate cancer follow-up outpatient patient voiding without difficulties.  Patient did have a history of chronic epistaxis outpatient referral obtained with ENT for evaluation and treat.   Blood pressures were monitored on TID basis and controlled     Rehab course: During patient's stay in rehab weekly team conferences were held to monitor patient's progress, set goals and discuss barriers to discharge. At admission, patient required minimal assist 30 feet rolling walker moderate assist stand pivot transfers  Physical exam.  Blood pressure 140/92 pulse 108 temperature 97.8 respiration 16 oxygen saturation 96% room air Constitutional.  No acute distress HEENT.  Trace left facial droop tongue midline Eyes.  Pupils round and reactive to light left visual field cut-hemianopsia Neck.  Supple nontender no JVD without thyromegaly Cardiac regular rate rhythm any extra sounds or murmur heard Abdomen.  Soft nontender positive bowel sounds without rebound Respiratory effort normal no respiratory distress without wheeze Skin.  Warm and dry Musculoskeletal.  Normal range of motion no rigidity Comments.  Right side 5/5 right upper right lower extremity Left upper extremity 4+/5 in biceps triceps, wrist extension, grip and FA Left lower  extremity 5 -/5 in hip flexors knee extension dorsi plantarflexion Neurologic.  Alert follows commands oriented provides name and age with some delay in processing.  Noted left hemianopsia.  Finger-nose searching pattern on left side.  He/She  has had improvement in activity tolerance, balance, postural control as well as ability to compensate for deficits. He/She has had improvement in functional use RUE/LUE  and  RLE/LLE as well as improvement in awareness.  Independent rolling independen functional ambulation rolling walker to the bathroom.  Gather belongings for activities day living and homemaking.  Modified independent supine to sit sit to supine ambulating 200 feet rolling walker.  Discussed with spouse working together being mindful of his deficits.  Speech therapy follow-up attending to task with some noted anxiety.  Full family teaching completed plan discharged to home       Disposition: Discharged home    Diet: Regular  Special Instructions: No driving smoking or alcohol  Medications at discharge 1.  Tylenol as needed 2.  Eliquis 5 mg p.o. twice daily 3.  Lipitor 80 mg p.o. daily 4.  Wellbutrin 450 mg daily 5.  Vitamin B12 1000 mcg p.o. daily 6.  Plendil 10 mg p.o. daily 7.  Atarax 10 mg every 6 hours as needed anxiety 8.  Metoprolol 50 mg 2-1/2 tablets daily 9.  Multivitamin daily 10.  FiberCon 1 tablet daily 11.  Aldactone 25 mg daily 12.  Vitamin D 50,000 units weekly  30-35 minutes were spent completing discharge summary and discharge planning  Discharge Instructions     Ambulatory referral to ENT   Complete by: As directed    Eval and treat for chronic epistasis   Ambulatory referral to Neurology   Complete by: As directed    An appointment is requested in approximately: 4 weeks right PCA infarction secondary to atrial fibrillation      Allergies as of 03/14/2021       Reactions   Amlodipine Other (See Comments)   Dizziness    Gabapentin     hallucinations        Medication List     STOP taking these medications    melatonin 3 MG Tabs tablet       TAKE these medications    acetaminophen 500 MG tablet Commonly known as: TYLENOL Take 500 mg by mouth every 6 (six) hours as needed for mild pain or headache.   apixaban 5 MG Tabs tablet Commonly known as: ELIQUIS Take 1 tablet (5 mg total) by mouth 2 (two) times daily.   atorvastatin 80 MG tablet Commonly known as: LIPITOR Take 1 tablet (80 mg total) by mouth daily.   buPROPion 150 MG 24 hr tablet Commonly known as: WELLBUTRIN XL Take 450 mg by mouth every morning.   cyanocobalamin 1000 MCG tablet Take 1 tablet (1,000 mcg total) by mouth daily.   felodipine 10 MG 24 hr tablet Commonly known as: PLENDIL Take 1 tablet (10 mg total) by mouth daily.   hydrOXYzine 10 MG tablet Commonly known as: ATARAX Take 1 tablet (10 mg total) by mouth every 6 (six) hours as needed for anxiety, itching or nausea. What changed: when to take this   metoprolol succinate 50 MG 24 hr tablet Commonly known as: Toprol XL Take 2.5 tablets (125 mg total) by mouth daily. Take with or immediately following a meal. What changed:  medication strength how much to take   multivitamin with minerals Tabs tablet Take 1 tablet by mouth daily.   polycarbophil 625 MG tablet Commonly known as: FIBERCON Take 1 tablet (625 mg total) by mouth daily.   spironolactone 25 MG tablet Commonly known as: ALDACTONE Take 1 tablet (25 mg total) by mouth daily.   Vitamin D (Ergocalciferol) 1.25 MG (50000 UNIT) Caps capsule Commonly known as: DRISDOL Take 1 capsule (50,000 Units total) by mouth once a week. On fridays  Follow-up Information     Raulkar, Clide Deutscher, MD Follow up.   Specialty: Physical Medicine and Rehabilitation Why: 05/12/21 please arrive at 1:20pm for 1:40pm appointment, thank you! Contact information: [address] [place]  [address] [phone]         Allwardt, Randa Evens, PA-C Follow up on 04/24/2021.   Specialty: Physician Assistant Why: Appointment @ 12:00 Contact information: [place] [address] [phone]         [facility]. Call.   Why: for appointment if you have not heard from them by midweek Contact information: [address]     [place] [address] [phone]                Signed: Cathlyn Parsons 03/15/2021, 3:35 PM

## 2021-03-19 ENCOUNTER — Telehealth: Payer: Self-pay | Admitting: *Deleted

## 2021-03-19 NOTE — Telephone Encounter (Signed)
Dena from [facility] called to report that the PT went out to do a start of care on George [name] and he was verbally abusive and the therapist had to leave.  They called and spoke with him and he was pleasant on the phone, so they will try again on Saturday. FYI only.

## 2021-03-25 DIAGNOSIS — I10 Essential (primary) hypertension: Secondary | ICD-10-CM | POA: Diagnosis not present

## 2021-03-25 DIAGNOSIS — I482 Chronic atrial fibrillation, unspecified: Secondary | ICD-10-CM | POA: Diagnosis not present

## 2021-03-25 DIAGNOSIS — E785 Hyperlipidemia, unspecified: Secondary | ICD-10-CM | POA: Diagnosis not present

## 2021-03-25 DIAGNOSIS — I6340 Cerebral infarction due to embolism of unspecified cerebral artery: Secondary | ICD-10-CM | POA: Diagnosis not present

## 2021-03-30 ENCOUNTER — Telehealth: Payer: Self-pay

## 2021-03-30 NOTE — Telephone Encounter (Signed)
I have covered this pt once, maybe in hospital, but don't know him   Pt and his wife need to call PCP and get in to be seen sooner than later- Dr Ranell Patrick is out this entire week, and he was having severe anxiety and depression issues in the hospital already- was sent home only on Wellbutrin based on what the note said- so therefore, needs to see PCP to get started on something else as soon as possible.   We don't usually treat these issues in the outpatient, at least until pt is seen for his first outpt appointment- He was severely depressed in the hospital and actually insisted on leaving early from scheduled d/c date, because he was so depressed/anxious-   The wellbutrin- he came ot rehab on this medicine- and I think he took at home- I don't see any NEW medicines for mood, so it doesn't appear ot be a medication reaction, unfortunately, it's from his depression.  And he needs additional treatment.  We tried Paxil in hospital, but caused him to be restless, of note.   Please have pt/family let us know what PCP plans to do. If he's a danger to himself or others, he needs to go to ER immediately.

## 2021-03-30 NOTE — Telephone Encounter (Signed)
Dr. Ranell Patrick is out of the office this week. Please review or advise.  Per George [name] (wife) George [name] complains of vivid dreams, depression and feeling unsafe. When taking Hydroxyzine for depression.   George [name] does not think he is a danger to himself or others. I advised her to call his PCP Dr. Terrence Dupont in Dr. Ranell Patrick absence. If problem worsen to go to the ER for evaluation.   Call back ph [phone]  Please advise.

## 2021-03-31 NOTE — Telephone Encounter (Signed)
Wife is aware of Dr. Ranell Patrick advice. She will have him here on 12/30/222 for the scheduled appointment.

## 2021-04-02 ENCOUNTER — Other Ambulatory Visit: Payer: Self-pay

## 2021-04-02 ENCOUNTER — Emergency Department (HOSPITAL_BASED_OUTPATIENT_CLINIC_OR_DEPARTMENT_OTHER)
Admission: EM | Admit: 2021-04-02 | Discharge: 2021-04-02 | Disposition: A | Discharge status: Home / Self Care | Payer: Medicare Other | Attending: Emergency Medicine | Admitting: Emergency Medicine

## 2021-04-02 ENCOUNTER — Encounter (HOSPITAL_BASED_OUTPATIENT_CLINIC_OR_DEPARTMENT_OTHER): Payer: Self-pay

## 2021-04-02 ENCOUNTER — Emergency Department (HOSPITAL_BASED_OUTPATIENT_CLINIC_OR_DEPARTMENT_OTHER): Payer: Medicare Other | Admitting: Radiology

## 2021-04-02 DIAGNOSIS — R0602 Shortness of breath: Secondary | ICD-10-CM | POA: Insufficient documentation

## 2021-04-02 DIAGNOSIS — R079 Chest pain, unspecified: Secondary | ICD-10-CM | POA: Diagnosis not present

## 2021-04-02 DIAGNOSIS — Z85038 Personal history of other malignant neoplasm of large intestine: Secondary | ICD-10-CM | POA: Diagnosis not present

## 2021-04-02 DIAGNOSIS — Z8546 Personal history of malignant neoplasm of prostate: Secondary | ICD-10-CM | POA: Insufficient documentation

## 2021-04-02 DIAGNOSIS — Z87891 Personal history of nicotine dependence: Secondary | ICD-10-CM | POA: Insufficient documentation

## 2021-04-02 DIAGNOSIS — F419 Anxiety disorder, unspecified: Secondary | ICD-10-CM | POA: Diagnosis not present

## 2021-04-02 DIAGNOSIS — I4891 Unspecified atrial fibrillation: Secondary | ICD-10-CM | POA: Diagnosis not present

## 2021-04-02 DIAGNOSIS — R531 Weakness: Secondary | ICD-10-CM | POA: Diagnosis not present

## 2021-04-02 DIAGNOSIS — I10 Essential (primary) hypertension: Secondary | ICD-10-CM | POA: Diagnosis not present

## 2021-04-02 DIAGNOSIS — Z79899 Other long term (current) drug therapy: Secondary | ICD-10-CM | POA: Diagnosis not present

## 2021-04-02 DIAGNOSIS — R0789 Other chest pain: Secondary | ICD-10-CM | POA: Diagnosis not present

## 2021-04-02 DIAGNOSIS — Z96653 Presence of artificial knee joint, bilateral: Secondary | ICD-10-CM | POA: Diagnosis not present

## 2021-04-02 LAB — TROPONIN I (HIGH SENSITIVITY)
Troponin I (High Sensitivity): 7 ng/L (ref <18)
Troponin I (High Sensitivity): 6 ng/L (ref <18)

## 2021-04-02 LAB — CBC
HCT: 37.1 % — ABNORMAL LOW (ref 39.0–52.0)
Hemoglobin: 11.8 g/dL — ABNORMAL LOW (ref 13.0–17.0)
MCH: 28.2 pg (ref 26.0–34.0)
MCHC: 31.8 g/dL (ref 30.0–36.0)
MCV: 88.8 fL (ref 80.0–100.0)
Platelets: 266 10*3/uL (ref 150–400)
RBC: 4.18 MIL/uL — ABNORMAL LOW (ref 4.22–5.81)
RDW: 13.4 % (ref 11.5–15.5)
WBC: 10.5 10*3/uL (ref 4.0–10.5)
nRBC: 0 % (ref 0.0–0.2)

## 2021-04-02 LAB — BASIC METABOLIC PANEL
Anion gap: 10 (ref 5–15)
BUN: 24 mg/dL — ABNORMAL HIGH (ref 8–23)
CO2: 21 mmol/L — ABNORMAL LOW (ref 22–32)
Calcium: 9.5 mg/dL (ref 8.9–10.3)
Chloride: 107 mmol/L (ref 98–111)
Creatinine, Ser: 1.67 mg/dL — ABNORMAL HIGH (ref 0.61–1.24)
GFR, Estimated: 42 mL/min — ABNORMAL LOW (ref >60)
Glucose, Bld: 113 mg/dL — ABNORMAL HIGH (ref 70–99)
Potassium: 3.6 mmol/L (ref 3.5–5.1)
Sodium: 138 mmol/L (ref 135–145)

## 2021-04-02 LAB — BRAIN NATRIURETIC PEPTIDE: B Natriuretic Peptide: 132.1 pg/mL — ABNORMAL HIGH (ref 0.0–100.0)

## 2021-04-02 MED ORDER — LORAZEPAM 1 MG PO TABS
1.0000 mg | ORAL_TABLET | Freq: Once | ORAL | Status: AC
  Administered 2021-04-02: 10:00:00 1 mg via ORAL
  Filled 2021-04-02: qty 1

## 2021-04-02 MED ORDER — LORAZEPAM 1 MG PO TABS: 1.0000 mg | ORAL_TABLET | Freq: Three times a day (TID) | ORAL | 0 refills | Status: DC | PRN

## 2021-04-02 MED ORDER — ASPIRIN 81 MG PO CHEW
324.0000 mg | CHEWABLE_TABLET | Freq: Once | ORAL | Status: AC
  Administered 2021-04-02: 10:00:00 324 mg via ORAL
  Filled 2021-04-02: qty 4

## 2021-04-02 NOTE — ED Triage Notes (Signed)
He c/o some left-sided pectoral discomfort, coupled with a "feeling like I'm short of breath" x 3-4 days. His daughter is with him. He also tells [place] that he had a recent stroke, after which his physician reduced the dose of his anti-anxiety medication. He admits to being "anxious". He denies H.I./S.I. No shortness of breath appreciated clinically.

## 2021-04-02 NOTE — ED Provider Notes (Signed)
[facility] EMERGENCY DEPT Provider Note   CSN: [id number] Arrival date & time: 04/02/21  5093     History Chief Complaint  Patient presents with   Chest Pain    George [name]. is a 75 y.o. male.  HPI 75 year old male presents with chest pain and shortness of breath.  He has left sided chest pain near his breast.  This has been on and off for about 3 days.  It was worst yesterday but is still present today so he came into the emergency department.  He states that the pain is a tightness.  It does not radiate.  There is no neck, back, abdominal pain.  No leg swelling or recent travel.  No pain with deep breathing.  No exertional component or obvious positional component.  He has tried Tylenol.  Pain is at its worst an 8.  He has had issues with anxiety and whenever he has "negative thoughts" it does seem to make the tightness worse.  Daughter notes he has been having issues with mental health and has had his depression meds adjusted by his PCP.  However she thinks anxiety is playing a high role in this.  Past Medical History:  Diagnosis Date   Anxiety    Arthritis    OSTEO IN KNEE   Asthma    as child   Colon cancer ([place])    Depression    Elevated prostate specific antigen (PSA)    Generalized anxiety disorder 04/09/2015   Hematospermia    Hx antineoplastic chemotherapy 2010   Hypertension    Idiopathic peripheral neuropathy    TOES OF BOTH FEET DUE TO CHEMO   Incomplete bladder emptying    Malignant neoplasm of prostate ([place])    Nodular prostate with lower urinary tract symptoms    Panic attacks 04/09/2015   Primary localized osteoarthrosis of the knee, right 05/19/2016   Prostatitis    S/P total knee arthroplasty, left 05/19/2016   Spermatocele    Weak urinary stream     Patient Active Problem List   Diagnosis Date Noted   AKI (acute kidney injury) ([place])    Dyslipidemia    Essential hypertension    Hemiparesis affecting left side as late effect of  stroke ([place])    Subcortical infarction ([place]) 03/05/2021   Recurrent major depressive disorder, in partial remission ([place])    CVA (cerebral vascular accident) ([place]) 02/27/2021   Atrial fibrillation with RVR ([place]) 02/27/2021   Hypokalemia 02/27/2021   Prolonged QT interval 02/27/2021   Polyneuropathy 01/31/2018   Chemotherapy-induced neuropathy ([place]) 01/31/2018   B12 deficiency 01/31/2018   Prediabetes 01/31/2018   Basal ganglia infarction ([place]) 01/31/2018   SBO (small bowel obstruction) ([place]) 10/20/2017   Primary localized osteoarthritis of right knee 05/31/2016   Primary localized osteoarthrosis of the knee, right 05/19/2016   S/P total knee arthroplasty, left 05/19/2016   Dyspnea 01/20/2016   DJD (degenerative joint disease) of knee 04/21/2015   Primary localized osteoarthritis of left knee 04/09/2015   Generalized anxiety disorder 04/09/2015   Panic attacks 04/09/2015   Spermatocele 05/08/2014   Benign localized hyperplasia of prostate with urinary obstruction 05/07/2014   Malignant neoplasm of prostate ([place]) 04/22/2014    Past Surgical History:  Procedure Laterality Date   COLON RESECTION   NOV 2009   COLONOSCOPY     PROSTATE BIOPSY     SPERMATOCELECTOMY Left 05/07/2014   Procedure: LEFT SPERMATOCELECTOMY;  Surgeon: Malka So, MD;  Location: [facility];  Service: Urology;  Laterality: Left;   TONSILLECTOMY     TOTAL KNEE ARTHROPLASTY Left 04/21/2015   Procedure: LEFT TOTAL KNEE ARTHROPLASTY;  Surgeon: Elsie Saas, MD;  Location: [facility];  Service: Orthopedics;  Laterality: Left;   TOTAL KNEE ARTHROPLASTY Right 05/31/2016   Procedure: TOTAL KNEE ARTHROPLASTY;  Surgeon: Elsie Saas, MD;  Location: [facility];  Service: Orthopedics;  Laterality: Right;   TRANSURETHRAL RESECTION OF PROSTATE N/A 05/07/2014   Procedure: TRANSURETHRAL RESECTION OF THE PROSTATE WITH GYRUS INSTRUMENTS;  Surgeon: Malka So, MD;  [facility];  Service: Urology;  Laterality: N/A;       Family  History  Problem Relation Age of Onset   Stroke Father    Cancer Brother        neoplasm of brain    Social History   Tobacco Use   Smoking status: Former    Packs/day: 0.25    Years: 3.00    Pack years: 0.75    Types: Cigarettes    Quit date: 04/12/1972    Years since quitting: 49.0   Smokeless tobacco: Never   Tobacco comments:    smoked 1 cigarette per day SOME DAYS  Vaping Use   Vaping Use: Never used  Substance Use Topics   Alcohol use: Yes    Alcohol/week: 1.0 standard drink    Types: 1 Cans of beer per week    Comment: OCCASIONAL   Drug use: No    Home Medications Prior to Admission medications   Medication Sig Start Date End Date Taking? Authorizing Provider  LORazepam (ATIVAN) 1 MG tablet Take 1 tablet (1 mg total) by mouth every 8 (eight) hours as needed for anxiety. 04/02/21  Yes Sherwood Gambler, MD  acetaminophen (TYLENOL) 500 MG tablet Take 500 mg by mouth every 6 (six) hours as needed for mild pain or headache.    [provider]  apixaban (ELIQUIS) 5 MG TABS tablet Take 1 tablet (5 mg total) by mouth 2 (two) times daily. 03/13/21   Love, Ivan Anchors, PA-C  atorvastatin (LIPITOR) 80 MG tablet Take 1 tablet (80 mg total) by mouth daily. 03/13/21   Love, Ivan Anchors, PA-C  buPROPion (WELLBUTRIN XL) 150 MG 24 hr tablet Take 450 mg by mouth every morning.     [provider]  felodipine (PLENDIL) 10 MG 24 hr tablet Take 1 tablet (10 mg total) by mouth daily. 03/05/21   Mercy Riding, MD  hydrOXYzine (ATARAX) 10 MG tablet Take 1 tablet (10 mg total) by mouth every 6 (six) hours as needed for anxiety, itching or nausea. 03/13/21   Love, Ivan Anchors, PA-C  metoprolol succinate (TOPROL XL) 50 MG 24 hr tablet Take 2.5 tablets (125 mg total) by mouth daily. Take with or immediately following a meal. 03/13/21   Love, Ivan Anchors, PA-C  Multiple Vitamin (MULTIVITAMIN WITH MINERALS) TABS tablet Take 1 tablet by mouth daily. 03/14/21   Love, Ivan Anchors, PA-C  polycarbophil  (FIBERCON) 625 MG tablet Take 1 tablet (625 mg total) by mouth daily. 03/14/21   Love, Ivan Anchors, PA-C  spironolactone (ALDACTONE) 25 MG tablet Take 1 tablet (25 mg total) by mouth daily. 03/14/21   Love, Ivan Anchors, PA-C  vitamin B-12 1000 MCG tablet Take 1 tablet (1,000 mcg total) by mouth daily. 03/06/21   Mercy Riding, MD  Vitamin D, Ergocalciferol, (DRISDOL) 1.25 MG (50000 UNIT) CAPS capsule Take 1 capsule (50,000 Units total) by mouth once a week. On fridays 03/13/21   Bary Leriche, PA-C  Allergies    Amlodipine and Gabapentin  Review of Systems   Review of Systems  Respiratory:  Positive for chest tightness and shortness of breath. Negative for cough.   Cardiovascular:  Positive for chest pain. Negative for leg swelling.  Gastrointestinal:  Negative for abdominal pain.  Musculoskeletal:  Negative for back pain.  All other systems reviewed and are negative.  Physical Exam Updated Vital Signs BP 124/75    Pulse (!) 49    Temp 97.7 F (36.5 C) (Oral)    Resp 15    Ht 6' (1.829 m)    Wt 89.8 kg    SpO2 97%    BMI 26.85 kg/m   Physical Exam Vitals and nursing note reviewed.  Constitutional:      Appearance: He is well-developed.  HENT:     Head: Normocephalic and atraumatic.     Right Ear: External ear normal.     Left Ear: External ear normal.     Nose: Nose normal.  Eyes:     General:        Right eye: No discharge.        Left eye: No discharge.  Cardiovascular:     Rate and Rhythm: Normal rate and regular rhythm.     Heart sounds: Normal heart sounds.  Pulmonary:     Effort: Pulmonary effort is normal.     Breath sounds: Normal breath sounds.  Chest:     Chest wall: No tenderness.  Abdominal:     Palpations: Abdomen is soft.     Tenderness: There is no abdominal tenderness.  Musculoskeletal:     Cervical back: Neck supple.  Skin:    General: Skin is warm and dry.  Neurological:     Mental Status: He is alert.    ED Results / Procedures / Treatments    Labs (all labs ordered are listed, but only abnormal results are displayed) Labs Reviewed  BASIC METABOLIC PANEL - Abnormal; Notable for the following components:      Result Value   CO2 21 (*)    Glucose, Bld 113 (*)    BUN 24 (*)    Creatinine, Ser 1.67 (*)    GFR, Estimated 42 (*)    All other components within normal limits  CBC - Abnormal; Notable for the following components:   RBC 4.18 (*)    Hemoglobin 11.8 (*)    HCT 37.1 (*)    All other components within normal limits  BRAIN NATRIURETIC PEPTIDE - Abnormal; Notable for the following components:   B Natriuretic Peptide 132.1 (*)    All other components within normal limits  TROPONIN I (HIGH SENSITIVITY)  TROPONIN I (HIGH SENSITIVITY)    EKG EKG Interpretation  Date/Time:  Thursday April 02 2021 08:52:38 EST Ventricular Rate:  57 PR Interval:  205 QRS Duration: 124 QT Interval:  454 QTC Calculation: 443 R Axis:   6 Text Interpretation: Sinus rhythm Nonspecific intraventricular conduction delay overall similar to Nov 2022 Confirmed by Sherwood Gambler [phone]) on 04/02/2021 9:09:25 AM  Radiology DG Chest Port 1 View  Result Date: 04/02/2021 CLINICAL DATA:  Chest pain and generalized weakness. EXAM: PORTABLE CHEST 1 VIEW COMPARISON:  02/27/2021 FINDINGS: 0901 hours. Low lung volumes. Subtle patchy airspace disease in the left lower lung may be atelectatic although infection not excluded. Right lung clear. No pulmonary edema or evidence of pleural effusion. The cardiopericardial silhouette is within normal limits for size. The visualized bony structures of the thorax show no  acute abnormality. Telemetry leads overlie the chest. IMPRESSION: Subtle patchy airspace disease left lower lung could be atelectasis although pneumonia not excluded. Electronically Signed   By: Misty Stanley M.D.   On: 04/02/2021 09:13    Procedures Procedures   Medications Ordered in ED Medications  aspirin chewable tablet 324 mg (324 mg  Oral Given 04/02/21 0948)  LORazepam (ATIVAN) tablet 1 mg (1 mg Oral Given 04/02/21 6283)    ED Course  I have reviewed the triage vital signs and the nursing notes.  Pertinent labs & imaging results that were available during my care of the patient were reviewed by me and considered in my medical decision making (see chart for details).    MDM Rules/Calculators/A&P                         Patient's chest pain is pretty atypical.  No pleuritic component and he is already on Eliquis.  Thus with low suspicion I do not think PE needs further work-up.  He is not hypoxic.  ACS is also unlikely with a nonischemic ECG and troponin is negative x2.  Doubt dissection.  There is questionable atelectasis versus pneumonia but he does not have cough.  His symptoms also resolved with Ativan as this seems to be anxiety driven.  While discussed we cannot rule out all heart disease, he does appear stable for discharge.  He and family are asking for a short course of something for breakthrough anxiety.  We will give short course of Ativan.  However discussed he needs long-term management with PCP.    Final Clinical Impression(s) / ED Diagnoses Final diagnoses:  Nonspecific chest pain  Anxiety    Rx / DC Orders ED Discharge Orders          Ordered    LORazepam (ATIVAN) 1 MG tablet  Every 8 hours PRN        04/02/21 1130             Sherwood Gambler, MD 04/02/21 1134

## 2021-04-02 NOTE — Discharge Instructions (Addendum)
If you develop recurrent, continued, or worsening chest pain, shortness of breath, fever, vomiting, abdominal or back pain, or any other new/concerning symptoms then return to the ER for evaluation.  

## 2021-04-09 ENCOUNTER — Other Ambulatory Visit: Payer: Self-pay | Admitting: Physical Medicine and Rehabilitation

## 2021-04-09 NOTE — Telephone Encounter (Signed)
Patient has a hospital follow up on 04/10/2021 at [facility].

## 2021-04-10 ENCOUNTER — Other Ambulatory Visit: Payer: Self-pay

## 2021-04-10 ENCOUNTER — Encounter
Payer: Medicare Other | Attending: Physical Medicine and Rehabilitation | Admitting: Physical Medicine and Rehabilitation

## 2021-04-10 ENCOUNTER — Encounter: Payer: Self-pay | Admitting: Physical Medicine and Rehabilitation

## 2021-04-10 VITALS — BP 116/75 | HR 64 | Temp 98.2°F | Ht 72.0 in | Wt 202.4 lb

## 2021-04-10 DIAGNOSIS — I639 Cerebral infarction, unspecified: Secondary | ICD-10-CM | POA: Diagnosis not present

## 2021-04-10 DIAGNOSIS — F411 Generalized anxiety disorder: Secondary | ICD-10-CM | POA: Diagnosis not present

## 2021-04-10 MED ORDER — LORAZEPAM 0.5 MG PO TABS: 0.5000 mg | ORAL_TABLET | Freq: Three times a day (TID) | ORAL | 0 refills | Status: DC

## 2021-04-10 NOTE — Progress Notes (Signed)
Subjective:    Patient ID: George Oto., male    DOB: 08-10-1945, 75 y.o.   MRN: [id number]  HPI George [name] is a 75 year old man who presents for hospital follow-up after CVA.  1) anxiety -stated back into own routine since he has been home -has anxiety every other day -received Xanax from ED and is worried about addiction -anything that scares him starts his anxiety, for example a police chase on TV.  -he watches a lot of TV but tries to watch more constructive things.  -He had a bad reaction to Paxil. He started ti have hallucinations with this. These have gone.  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 This really motivates him.   2) cva -declined home therapy and decided to pursue outpatient therapy which starts jan 13th.    Pain Inventory Average Pain 10 Pain Right Now 10 My pain is sharp and dull  LOCATION OF PAIN  shoulder back and chest  BOWEL Number of stools per week: 10  BLADDER Normal  Mobility walk without assistance ability to climb steps?  no do you drive?  yes  Function retired  Neuro/Psych anxiety  Prior Studies Any changes since last visit?  no  Physicians involved in your care Any changes since last visit?  no   Family History  Problem Relation Age of Onset   Stroke Father    Cancer Brother        neoplasm of brain   Social History   Socioeconomic History   Marital status: Married    Spouse name: Not on file   Number of children: 9   Years of education: Not on file   Highest education level: Not on file  Occupational History   Occupation: retire  Tobacco Use   Smoking status: Former    Packs/day: 0.25    Years: 3.00    Pack years: 0.75    Types: Cigarettes    Quit date: 04/12/1972    Years since quitting: 49.0   Smokeless tobacco: Never   Tobacco comments:    smoked 1 cigarette per day SOME DAYS  Vaping Use   Vaping Use: Never used  Substance and Sexual  Activity   Alcohol use: Yes    Alcohol/week: 1.0 standard drink    Types: 1 Cans of beer per week    Comment: OCCASIONAL   Drug use: No   Sexual activity: Yes  Other Topics Concern   Not on file  Social History Narrative   George [name] will take care of him after his surgery    Her cell is [phone]   Social Determinants of Health   Financial Resource Strain: Not on file  Food Insecurity: Not on file  Transportation Needs: Not on file  Physical Activity: Not on file  Stress: Not on file  Social Connections: Not on file   Past Surgical History:  Procedure Laterality Date   COLON RESECTION   NOV 2009   COLONOSCOPY     PROSTATE BIOPSY     SPERMATOCELECTOMY Left 05/07/2014   Procedure: LEFT SPERMATOCELECTOMY;  Surgeon: Malka So, MD;  [facility];  Service: Urology;  Laterality: Left;   TONSILLECTOMY     TOTAL KNEE ARTHROPLASTY Left 04/21/2015   Procedure: LEFT TOTAL KNEE ARTHROPLASTY;  Surgeon: Elsie Saas, MD;  Location: [facility];  Service: Orthopedics;  Laterality: Left;   TOTAL KNEE ARTHROPLASTY Right 05/31/2016   Procedure:  TOTAL KNEE ARTHROPLASTY;  Surgeon: Elsie Saas, MD;  Location: [facility];  Service: Orthopedics;  Laterality: Right;   TRANSURETHRAL RESECTION OF PROSTATE N/A 05/07/2014   Procedure: TRANSURETHRAL RESECTION OF THE PROSTATE WITH GYRUS INSTRUMENTS;  Surgeon: Malka So, MD;  [facility];  Service: Urology;  Laterality: N/A;   Past Medical History:  Diagnosis Date   Anxiety    Arthritis    OSTEO IN KNEE   Asthma    as child   Colon cancer ([place])    Depression    Elevated prostate specific antigen (PSA)    Generalized anxiety disorder 04/09/2015   Hematospermia    Hx antineoplastic chemotherapy 2010   Hypertension    Idiopathic peripheral neuropathy    TOES OF BOTH FEET DUE TO CHEMO   Incomplete bladder emptying    Malignant neoplasm of prostate ([place])    Nodular prostate with lower urinary tract symptoms    Panic attacks 04/09/2015    Primary localized osteoarthrosis of the knee, right 05/19/2016   Prostatitis    S/P total knee arthroplasty, left 05/19/2016   Spermatocele    Weak urinary stream    BP 116/75    Pulse 64    Temp 98.2 F (36.8 C)    Ht 6' (1.829 m)    Wt 202 lb 6.4 oz (91.8 kg)    SpO2 96%    BMI 27.45 kg/m   Opioid Risk Score:   Fall Risk Score:  `1  Depression screen PHQ 2/9  Depression screen [facility] 2/9 04/10/2021 04/22/2014  Decreased Interest 2 0  Down, Depressed, Hopeless 0 0  PHQ - 2 Score 2 0  Altered sleeping 0 -  Tired, decreased energy 3 -  Change in appetite 0 -  Feeling bad or failure about yourself  0 -  Trouble concentrating 1 -  Moving slowly or fidgety/restless 0 -  Suicidal thoughts 0 -  PHQ-9 Score 6 -     Review of Systems  Constitutional: Negative.   HENT: Negative.    Eyes: Negative.   Respiratory: Negative.    Cardiovascular: Negative.   Gastrointestinal: Negative.   Endocrine: Negative.   Genitourinary: Negative.   Musculoskeletal:  Positive for back pain.  Skin: Negative.   Allergic/Immunologic: Negative.   Neurological: Negative.   Hematological:  Bruises/bleeds easily.       Eliquis  Psychiatric/Behavioral:  Positive for dysphoric mood. The patient is nervous/anxious.   All other systems reviewed and are negative.     Objective:   Physical Exam Gen: no distress, normal appearing HEENT: oral mucosa pink and moist, NCAT Cardio: Reg rate Chest: normal effort, normal rate of breathing Abd: soft, non-distended Ext: no edema Psych: pleasant, normal affect Skin: intact Neuro: Alert and oriented x3 Musculoskeletal:     Assessment & Plan:  1) CVA -continue therapies -would benefit from handicap placard to increase her mobility in the community -provided dietary and exercise counseling -discussed that hypertension is number one reversible risk factor for stroke.    2) Anxiety: -Discussed Lexapro- clear with cardiologist first given prolonged QT  syndrome -Refilled Ativan, discussed risks- hope to stop once Lexapro or other medication can be started -referred to psychology for behavioral therapy -Discussed exercise and meditation as tools to decrease anxiety. -Recommended Down Dog Yoga app -Discussed spending time outdoors. -Discussed positive re-framing of anxiety.  -Discussed the following foods that have been show to reduce anxiety: 1) [place] nuts, mushrooms, soy beans due to their high selenium content. Upper limit  of toxicity of selenium is 444mcg/day so no more than 3-4 [place] nuts per day.  2) Fatty fish such as salmon, mackerel, sardines, trout, and herring- high in omega-3 fatty acids 3) Eggs- increases serotonin and dopamine 4) Pumpkin seeds- high in omega-3 fatty acids 5) dark chocolate- high in flavanols that increase blood flow to brain 6) turmeric- take with black pepper to increase absorption 7) chamomile tea- antioxidant and anti-inflammatory properties 8) yogurt without sugar- supports gut-brain axis 9) green tea- contains L- theanine 10) blueberries- high in vitamin C and antioxidants 11) [place]- high in tryptophan which gets converted to serotonin 12) bell peppers- rich in vitamin C and antioxidants 13) citrus fruits- rich in vitamin C and antioxidants 14) almonds- high in vitamin E and healthy fats 15) chia seeds- high in omega-3 fatty acids

## 2021-04-10 NOTE — Patient Instructions (Addendum)
Lexapro 10mg  Coenzyme Q10  -Discussed exercise and meditation as tools to decrease anxiety. -Recommended Down Dog Yoga app -Discussed spending time outdoors. -Discussed positive re-framing of anxiety.  -Discussed the following foods that have been show to reduce anxiety: 1) [place] nuts, mushrooms, soy beans due to their high selenium content. Upper limit of toxicity of selenium is 467mcg/day so no more than 3-4 [place] nuts per day.  2) Fatty fish such as salmon, mackerel, sardines, trout, and herring- high in omega-3 fatty acids 3) Eggs- increases serotonin and dopamine 4) Pumpkin seeds- high in omega-3 fatty acids 5) dark chocolate- high in flavanols that increase blood flow to brain 6) turmeric- take with black pepper to increase absorption 7) chamomile tea- antioxidant and anti-inflammatory properties 8) yogurt without sugar- supports gut-brain axis 9) green tea- contains L- theanine 10) blueberries- high in vitamin C and antioxidants 11) [place]- high in tryptophan which gets converted to serotonin 12) bell peppers- rich in vitamin C and antioxidants 13) citrus fruits- rich in vitamin C and antioxidants 14) almonds- high in vitamin E and healthy fats 15) chia seeds- high in omega-3 fatty acids  Provided with list of supplements that can help with dyslipidemia: 1) Vitamin B3 500-4,000mg  in divided doses daily (would recommend starting low as can cause uncomfortable facial flushing if started at too high a dose) 2) Phytosterols 2.15 grams daily 3) Fermented soy 30-50 grams daily 4) EGCG (found in green tea): 500-1000mg  daily 5) Omega-3 fatty acids 3000-5,000mg  daily 6) Flax seed 40 grams daily 7) Monounsaturated fats 20-40 grams daily (olives, olive oil, nuts), also reduces cardiovascular disease 8) Sesame: 40 grams daily 9) Gamma/delta tocotrienols- a family of unsaturated forms of Vitamin E- 200mg  with dinner 10) Pantethine 900mg  daily in divided doses 11) Resveratrol 250mg   daily 12) N Acetyl Cysteine 2000mg  daily in divided doses 13) Curcumin 2000-5000mg  in divided doses daily 14) Pomegranate juice: 8 ounces daily, also helps to lower blood pressure 15) Pomegranate seeds one cup daily, also helps to lower blood pressure 16) Citrus Bergamot 1000mg  daily, also helps with glucose control and weight loss 17) Vitamin C 500mg  daily 18) Quercetin 500-1000mg  daily 19) Glutathione 20) Probiotics 60-100 billion organisms per day 21) Fiber 22) Oats 23) Aged garlic (can eat as food or supplement of 600-900mg  per day) 24) Chia seeds 25 grams per day 25) Lycopene- carotenoid found in high concentrations in tomatoes. 26) Alpha linolenic acid 27) Flavonoids and anthocyanins 28) Wogonin- flavanoid that enhances reverse cholesterol transport 29) Coenzyme Q10 30) Pantethine- derivative of Vitamin B5: 300mg  three times per day or 450mg  twice per day with or without food 31) Barley and other whole grains 32) Orange juice 33) L- carnitine 34) L- Lysine 35) L- Arginine 36) Almonds 37) [place]in 38) Rutin 39) Carnosine 40) Histidine  41) Kaempferol  42) Organosulfur compounds 43) Vitamin E 44) Oleic acid 45) RBO (ferulic acid gammaoryzanol) 46) grape seed extract 47) Red wine 48) [place] HCL 500mg  daily or twice per day- more effective and with fewer adverse effects that ezetimibe monotherapy 49) red yeast rice 2400- 4800 mg/day 50) chlorella 51) Licorice

## 2021-04-13 DIAGNOSIS — Z23 Encounter for immunization: Secondary | ICD-10-CM | POA: Diagnosis not present

## 2021-04-16 ENCOUNTER — Telehealth: Payer: Self-pay

## 2021-04-16 NOTE — Telephone Encounter (Signed)
George [name] has spoken to Dr. Terrence Dupont. He said will be okay if you prescribed the Lexapro for George [name].   Call back phone [phone].

## 2021-04-17 ENCOUNTER — Other Ambulatory Visit: Payer: Self-pay | Admitting: Physical Medicine and Rehabilitation

## 2021-04-17 MED ORDER — ESCITALOPRAM OXALATE 5 MG PO TABS: 5.0000 mg | ORAL_TABLET | Freq: Every day | ORAL | 3 refills | Status: DC

## 2021-04-20 ENCOUNTER — Telehealth: Payer: Self-pay

## 2021-04-20 NOTE — Telephone Encounter (Signed)
Patient informed. Already picked up.

## 2021-04-20 NOTE — Telephone Encounter (Signed)
Dr. Ranell Patrick is not in the office this afternoon:   Mrs. George [name] called to report that George [name] blood pressure reading have be 105/51, 91/43 & 97/44.   She has been advised to take him to an Urgent Care or call EMS. Because Dr. Durward Fortes is not in the office. Today. Also call the pending PCP to see if his appointment can be moved up. However Dr. Ranell Patrick will informed on  her return to the office.   Call back phone [phone].

## 2021-04-22 NOTE — Telephone Encounter (Signed)
I let George [name] know the directions per Dr Ranell Patrick to decrease Metoprolol to 2 tablets per day and call our office in 2 days and let us know how he is doing.. THey actually called Dr Terrence Dupont , his cardiologist , and he gave them directions to decrease other medications as well.

## 2021-04-24 ENCOUNTER — Ambulatory Visit (INDEPENDENT_AMBULATORY_CARE_PROVIDER_SITE_OTHER): Payer: Medicare Other | Admitting: Physician Assistant

## 2021-04-24 ENCOUNTER — Other Ambulatory Visit: Payer: Self-pay

## 2021-04-24 ENCOUNTER — Encounter: Payer: Self-pay | Admitting: Physician Assistant

## 2021-04-24 VITALS — BP 143/80 | HR 59 | Temp 98.4°F | Ht 72.0 in | Wt 199.2 lb

## 2021-04-24 DIAGNOSIS — F410 Panic disorder [episodic paroxysmal anxiety] without agoraphobia: Secondary | ICD-10-CM

## 2021-04-24 DIAGNOSIS — F411 Generalized anxiety disorder: Secondary | ICD-10-CM

## 2021-04-24 DIAGNOSIS — Z8673 Personal history of transient ischemic attack (TIA), and cerebral infarction without residual deficits: Secondary | ICD-10-CM

## 2021-04-24 MED ORDER — HYDROXYZINE HCL 10 MG PO TABS: 10.0000 mg | ORAL_TABLET | Freq: Four times a day (QID) | ORAL | 0 refills | Status: DC | PRN

## 2021-04-24 NOTE — Progress Notes (Signed)
Subjective:    Patient ID: George Oto., male    DOB: 04/04/1946, 76 y.o.   MRN: [id number]  Chief Complaint  Patient presents with   Medication Refill   Anxiety    HPI 76 y.o. patient presents today for new patient establishment with me.  Patient was previously established with no PCP. He is here with his wife, Kathryne Gin.   Current Care Team: -Cardiology, Dr. Terrence Dupont -Physical Medicine -Neurology - has first appt next week   -[facility]  Acute Concerns:  Anxiety with panic disorder: -at least 3-4 times during the day. Started after his CVA.  -Chest tightness, feels scared, rapid heartbeat.  -Breathing exercises are not helping.  -Currently on Lexapro 5 mg - just started this one week ago.  -Wellbutrin XL 150 mg TID - started on this about 17 years ago and he says he cannot be off of it -No longer on Ativan. Still taking Atarax 10 mg QID.  -Not established with psychiatry, but it has been suggested.   Difficulty sleeping: -Worries often, trouble falling asleep, will then sleep for 4-5 hours straight.   Ischemic Stroke 02/27/21 -A-fib cause -Left side has been affected. Left thigh soreness and weakness in left hand.  -He will be set up with outpatient PT next week. Sounds like there was a complication getting PT setup at home.  -Says he has been sitting more often since the stroke. Used to stay active with walking & trying to get back to this.  Chronic Concerns: See PMH listed below, as well as A/P for details on issues we specifically discussed during today's visit.      Past Medical History:  Diagnosis Date   Anxiety    Arthritis    OSTEO IN KNEE   Asthma    as child   Colon cancer ([place])    Depression    Elevated prostate specific antigen (PSA)    Generalized anxiety disorder 04/09/2015   Hematospermia    Hx antineoplastic chemotherapy 2010   Hypertension    Idiopathic peripheral neuropathy    TOES OF BOTH FEET DUE TO CHEMO   Incomplete  bladder emptying    Malignant neoplasm of prostate ([place])    Nodular prostate with lower urinary tract symptoms    Panic attacks 04/09/2015   Primary localized osteoarthrosis of the knee, right 05/19/2016   Prostatitis    S/P total knee arthroplasty, left 05/19/2016   Spermatocele    Weak urinary stream     Past Surgical History:  Procedure Laterality Date   COLON RESECTION   NOV 2009   COLONOSCOPY     PROSTATE BIOPSY     SPERMATOCELECTOMY Left 05/07/2014   Procedure: LEFT SPERMATOCELECTOMY;  Surgeon: Malka So, MD;  Location: [facility];  Service: Urology;  Laterality: Left;   TONSILLECTOMY     TOTAL KNEE ARTHROPLASTY Left 04/21/2015   Procedure: LEFT TOTAL KNEE ARTHROPLASTY;  Surgeon: Elsie Saas, MD;  Location: [place];  Service: Orthopedics;  Laterality: Left;   TOTAL KNEE ARTHROPLASTY Right 05/31/2016   Procedure: TOTAL KNEE ARTHROPLASTY;  Surgeon: Elsie Saas, MD;  Location: [facility];  Service: Orthopedics;  Laterality: Right;   TRANSURETHRAL RESECTION OF PROSTATE N/A 05/07/2014   Procedure: TRANSURETHRAL RESECTION OF THE PROSTATE WITH GYRUS INSTRUMENTS;  Surgeon: Malka So, MD;  Location: [facility];  Service: Urology;  Laterality: N/A;    Family History  Problem Relation Age of Onset   Stroke Father  age 30   Cancer Brother        neoplasm of brain    Social History   Tobacco Use   Smoking status: Former    Packs/day: 0.25    Years: 3.00    Pack years: 0.75    Types: Cigarettes    Quit date: 04/12/1972    Years since quitting: 49.0   Smokeless tobacco: Never   Tobacco comments:    smoked 1 cigarette per day SOME DAYS  Vaping Use   Vaping Use: Never used  Substance Use Topics   Alcohol use: Yes    Alcohol/week: 1.0 standard drink    Types: 1 Cans of beer per week    Comment: OCCASIONAL   Drug use: No     Allergies  Allergen Reactions   Amlodipine Other (See Comments)    Dizziness    Gabapentin     hallucinations    Review of Systems NEGATIVE UNLESS  OTHERWISE INDICATED IN HPI      Objective:     BP (!) 143/80    Pulse (!) 59    Temp 98.4 F (36.9 C)    Ht 6' (1.829 m)    Wt 199 lb 3.2 oz (90.4 kg)    SpO2 98%    BMI 27.02 kg/m   Wt Readings from Last 3 Encounters:  04/24/21 199 lb 3.2 oz (90.4 kg)  04/10/21 202 lb 6.4 oz (91.8 kg)  04/02/21 198 lb (89.8 kg)    BP Readings from Last 3 Encounters:  04/24/21 (!) 143/80  04/10/21 116/75  04/02/21 (!) 134/103     Physical Exam Vitals and nursing note reviewed.  Constitutional:      General: He is not in acute distress.    Appearance: Normal appearance. He is not toxic-appearing.  HENT:     Head: Normocephalic and atraumatic.     Right Ear: Tympanic membrane, ear canal and external ear normal.     Left Ear: Tympanic membrane, ear canal and external ear normal.     Nose: Nose normal.     Mouth/Throat:     Mouth: Mucous membranes are moist.     Pharynx: Oropharynx is clear.  Eyes:     Extraocular Movements: Extraocular movements intact.     Conjunctiva/sclera: Conjunctivae normal.     Pupils: Pupils are equal, round, and reactive to light.  Cardiovascular:     Rate and Rhythm: Normal rate and regular rhythm.     Pulses: Normal pulses.     Heart sounds: Normal heart sounds.  Pulmonary:     Effort: Pulmonary effort is normal.     Breath sounds: Normal breath sounds.  Musculoskeletal:        General: Normal range of motion.     Cervical back: Normal range of motion and neck supple.  Skin:    General: Skin is warm and dry.  Neurological:     Mental Status: He is alert and oriented to person, place, and time. Mental status is at baseline.     Motor: Weakness (very slight weakness noted in left leg and hand grip strength vs right, otherwise his strength is very impressive and he is getting around without assistance well) present.  Psychiatric:        Mood and Affect: Mood is anxious.       Assessment & Plan:   Problem List Items Addressed This Visit       Other    Generalized anxiety disorder   Relevant Medications  hydrOXYzine (ATARAX) 10 MG tablet   Other Relevant Orders   Ambulatory referral to Psychiatry   Panic attacks - Primary   Relevant Medications   hydrOXYzine (ATARAX) 10 MG tablet   Other Relevant Orders   Ambulatory referral to Psychiatry   Other Visit Diagnoses     History of CVA (cerebrovascular accident)       Relevant Orders   Ambulatory referral to Psychiatry        Meds ordered this encounter  Medications   hydrOXYzine (ATARAX) 10 MG tablet    Sig: Take 1 tablet (10 mg total) by mouth every 6 (six) hours as needed for anxiety, itching or nausea.    Dispense:  75 tablet    Refill:  0   1. Panic attacks 2. Generalized anxiety disorder -Referral to psychiatry as there will be a need with medication management assistance as well as need for counseling. Longstanding hx, but now definitely worse after CVA in November 2022.  -Refilled the hydroxyzine for him -He will cont on Wellbutrin XL 450 mg daily and Lexapro 5 mg daily  -No longer on Ativan   3. History of CVA (cerebrovascular accident) -New history as of November, doing well despite not having PT yet. He plans to have this started next week. -He is following up with cardiology. -Taking Eliquis, Lipitor, Toprol, Cozaar, Plendil -Trying to stay active and work on his strength -He will also f/up with Physical Medicine and Rehab -Will specialist appts coming up, plan to f/up with me every 6 months or prn   This note was prepared with assistance of Dragon voice recognition software. Occasional wrong-word or sound-a-like substitutions may have occurred due to the inherent limitations of voice recognition software.  Time Spent: 32 minutes of total time was spent on the date of the encounter performing the following actions: chart review prior to seeing the patient, obtaining history, performing a medically necessary exam, counseling on the treatment plan, placing  orders, and documenting in our EHR.    Cleave Ternes M Debraann Livingstone, PA-C

## 2021-04-24 NOTE — Patient Instructions (Signed)
Very good to meet you today. Please keep regular follow up with your specialists. I have placed a referral to psychiatry to help with your anxiety and panic attacks. Keep working towards your physical therapy goals. We also have PT in our office if you need this scheduled here let us know.  Call sooner if any concerns.

## 2021-04-30 ENCOUNTER — Encounter: Payer: Self-pay | Admitting: Adult Health

## 2021-04-30 ENCOUNTER — Ambulatory Visit (INDEPENDENT_AMBULATORY_CARE_PROVIDER_SITE_OTHER): Payer: Medicare Other | Admitting: Adult Health

## 2021-04-30 VITALS — BP 130/84 | HR 58 | Ht 72.0 in | Wt 201.0 lb

## 2021-04-30 DIAGNOSIS — I63431 Cerebral infarction due to embolism of right posterior cerebral artery: Secondary | ICD-10-CM | POA: Diagnosis not present

## 2021-04-30 DIAGNOSIS — H53462 Homonymous bilateral field defects, left side: Secondary | ICD-10-CM

## 2021-04-30 DIAGNOSIS — I69354 Hemiplegia and hemiparesis following cerebral infarction affecting left non-dominant side: Secondary | ICD-10-CM | POA: Diagnosis not present

## 2021-04-30 NOTE — Patient Instructions (Addendum)
Please call our neuro rehab next week to schedule initial evaluation   Continue Eliquis (apixaban) daily  and atorvastatin  for secondary stroke prevention  Continue to follow up with PCP regarding cholesterol and blood pressure management  Maintain strict control of hypertension with blood pressure goal below 130/90 and cholesterol with LDL cholesterol (bad cholesterol) goal below 70 mg/dL.   Signs of a Stroke? Follow the BEFAST method:  Balance Watch for a sudden loss of balance, trouble with coordination or vertigo Eyes Is there a sudden loss of vision in one or both eyes? Or double vision?  Face: Ask the person to smile. Does one side of the face droop or is it numb?  Arms: Ask the person to raise both arms. Does one arm drift downward? Is there weakness or numbness of a leg? Speech: Ask the person to repeat a simple phrase. Does the speech sound slurred/strange? Is the person confused ? Time: If you observe any of these signs, call 911.     Followup in the future with me in 4 months or call earlier if needed       Thank you for coming to see [place] at [facility]. I hope we have been able to provide you high quality care today.  You may receive a patient satisfaction survey over the next few weeks. We would appreciate your feedback and comments so that we may continue to improve ourselves and the health of our patients.    Stroke Prevention Some medical conditions and lifestyle choices can lead to a higher risk for a stroke. You can help to prevent a stroke by eating healthy foods and exercising. It also helps to not smoke and to manage any health problems you may have. How can this condition affect me? A stroke is an emergency. It should be treated right away. A stroke can lead to brain damage or threaten your life. There is a better chance of surviving and getting better after a stroke if you get medical help right away. What can increase my risk? The  following medical conditions may increase your risk of a stroke: Diseases of the heart and blood vessels (cardiovascular disease). High blood pressure (hypertension). Diabetes. High cholesterol. Sickle cell disease. Problems with blood clotting. Being very overweight. Sleeping problems (obstructivesleep apnea). Other risk factors include: Being older than age 72. A history of blood clots, stroke, or mini-stroke (TIA). Race, ethnic background, or a family history of stroke. Smoking or using tobacco products. Taking birth control pills, especially if you smoke. Heavy alcohol and drug use. Not being active. What actions can I take to prevent this? Manage your health conditions High cholesterol. Eat a healthy diet. If this is not enough to manage your cholesterol, you may need to take medicines. Take medicines as told by your doctor. High blood pressure. Try to keep your blood pressure below 130/80. If your blood pressure cannot be managed through a healthy diet and regular exercise, you may need to take medicines. Take medicines as told by your doctor. Ask your doctor if you should check your blood pressure at home. Have your blood pressure checked every year. Diabetes. Eat a healthy diet and get regular exercise. If your blood sugar (glucose) cannot be managed through diet and exercise, you may need to take medicines. Take medicines as told by your doctor. Talk to your doctor about getting checked for sleeping problems. Signs of a problem can include: Snoring a lot. Feeling very tired. Make sure that you  manage any other conditions you have. Nutrition  Follow instructions from your doctor about what to eat or drink. You may be told to: Eat and drink fewer calories each day. Limit how much salt (sodium) you use to 1,500 milligrams (mg) each day. Use only healthy fats for cooking, such as olive oil, canola oil, and sunflower oil. Eat healthy foods. To do this: Choose foods that  are high in fiber. These include whole grains, and fresh fruits and vegetables. Eat at least 5 servings of fruits and vegetables a day. Try to fill one-half of your plate with fruits and vegetables at each meal. Choose low-fat (lean) proteins. These include low-fat cuts of meat, chicken without skin, fish, tofu, beans, and nuts. Eat low-fat dairy products. Avoid foods that: Are high in salt. Have saturated fat. Have trans fat. Have cholesterol. Are processed or pre-made. Count how many carbohydrates you eat and drink each day. Lifestyle If you drink alcohol: Limit how much you have to: 0-1 drink a day for women who are not pregnant. 0-2 drinks a day for men. Know how much alcohol is in your drink. In the [place] one drink equals one 12 oz bottle of beer (370mL), one 5 oz glass of wine (129mL), or one 1 oz glass of hard liquor (7mL). Do not smoke or use any products that have nicotine or tobacco. If you need help quitting, ask your doctor. Avoid secondhand smoke. Do not use drugs. Activity  Try to stay at a healthy weight. Get at least 30 minutes of exercise on most days, such as: Fast walking. Biking. Swimming. Medicines Take over-the-counter and prescription medicines only as told by your doctor. Avoid taking birth control pills. Talk to your doctor about the risks of taking birth control pills if: You are over 81 years old. You smoke. You get very bad headaches. You have had a blood clot. Where to find more information American Stroke Association: www.strokeassociation.org Get help right away if: You or a loved one has any signs of a stroke. "BE FAST" is an easy way to remember the warning signs: B - Balance. Dizziness, sudden trouble walking, or loss of balance. E - Eyes. Trouble seeing or a change in how you see. F - Face. Sudden weakness or loss of feeling of the face. The face or eyelid may droop on one side. A - Arms. Weakness or loss of feeling in an arm. This happens  all of a sudden and most often on one side of the body. S - Speech. Sudden trouble speaking, slurred speech, or trouble understanding what people say. T - Time. Time to call emergency services. Write down what time symptoms started. You or a loved one has other signs of a stroke, such as: A sudden, very bad headache with no known cause. Feeling like you may vomit (nausea). Vomiting. A seizure. These symptoms may be an emergency. Get help right away. Call your local emergency services (911 in the [place] Do not wait to see if the symptoms will go away. Do not drive yourself to the hospital. Summary You can help to prevent a stroke by eating healthy, exercising, and not smoking. It also helps to manage any health problems you have. Do not smoke or use any products that contain nicotine or tobacco. Get help right away if you or a loved one has any signs of a stroke. This information is not intended to replace advice given to you by your health care provider. Make sure you discuss any  questions you have with your health care provider. Document Revised: 10/29/2019 Document Reviewed: 10/29/2019 Elsevier Patient Education  [place].

## 2021-04-30 NOTE — Progress Notes (Signed)
[facility] [address] [place] [address] [phone]       HOSPITAL FOLLOW UP NOTE  [name]. Date of Birth:  03/23/1946 Medical Record Number:  [id number]   Reason for Referral:  hospital stroke follow up    SUBJECTIVE:   CHIEF COMPLAINT:  Chief Complaint  Patient presents with   Follow-up    Rm 3 with spouse [name]  Pt is well, having some L thigh and back pain. Overall stable, no other stroke concerns     HPI:   [name]. is a 76 y.o. male with history of 76 y.o. male with history h/o colon cancer s/p resection, prostate cancer, anxiety/depression, osteoarthritis of bilateral knees who presented on 02/27/2021 with complaints of persistent headaches and left-sided weakness over past 4 days since receiving flu vaccine on 11/14.  Personally reviewed hospitalization pertinent progress notes, lab work and imaging.  Evaluated by Dr. Leonie Man for subacute right PCA infarct, embolic secondary to new onset A. fib.  CTA head/neck showed possible focal occlusion of P2 branch otherwise unremarkable.  Evidence of old right parietal lobe stroke on imaging.  Unable to complete MRI due to metal in eye.  EF 60 to 65%.  EKG showed evidence of A. Fib -placed on Eliquis 5 mg twice daily.  LDL 58.  A1c 5.4.  Advised continuation of atorvastatin at discharge. Per exam, dense left homonymous hemianopia and mild LLE weakness.  Therapy eval's recommended CIR for functional decline.   Today, 04/30/2021, [name] is being seen for initial hospital follow-up accompanied by his wife.  He was discharged home from CIR on 03/14/2021. Reports residual left peripheral visual impairment, decreased left hand dexterity and c/o left thigh pain/cramping at night but denies residual weakness. Would like to start outpatient therapies. Plans to f/u with ophthalmology.  Denies new stroke/TIA symptoms.  Compliant on Eliquis and atorvastatin without side effects.  Blood pressure  today 130/84.  No further concerns at this time.     PERTINENT IMAGING  Per hospitalization 02/27/2021 CTH and CTA head & neck  1. Subacute right PCA territorial infarct with probable small foci of petechial hemorrhage. 2. Possible focal occlusion of a P2 branch at the level of the ambient cistern, though the PCA is identified within the hypodense right occipital lobe. 3. Otherwise, patent vasculature of the head and neck with no significant atherosclerotic disease. 4. Small remote infarct in the right parietal lobe MRI  Unable to obtain due to metal in eye 2D Echo 60 to 65% EKG with A Fib LDL 58 HgbA1c 5.4       ROS:   14 system review of systems performed and negative with exception of those listed in HPI  PMH:  Past Medical History:  Diagnosis Date   Anxiety    Arthritis    OSTEO IN KNEE   Asthma    as child   Colon cancer ([place])    Depression    Elevated prostate specific antigen (PSA)    Generalized anxiety disorder 04/09/2015   Hematospermia    Hx antineoplastic chemotherapy 2010   Hypertension    Idiopathic peripheral neuropathy    TOES OF BOTH FEET DUE TO CHEMO   Incomplete bladder emptying    Malignant neoplasm of prostate ([place])    Nodular prostate with lower urinary tract symptoms    Panic attacks 04/09/2015   Primary localized osteoarthrosis of the knee, right 05/19/2016   Prostatitis    S/P total knee arthroplasty,  left 05/19/2016   Spermatocele    Weak urinary stream     PSH:  Past Surgical History:  Procedure Laterality Date   COLON RESECTION   NOV 2009   COLONOSCOPY     PROSTATE BIOPSY     SPERMATOCELECTOMY Left 05/07/2014   Procedure: LEFT SPERMATOCELECTOMY;  Surgeon: Malka So, MD;  Location: [facility];  Service: Urology;  Laterality: Left;   TONSILLECTOMY     TOTAL KNEE ARTHROPLASTY Left 04/21/2015   Procedure: LEFT TOTAL KNEE ARTHROPLASTY;  Surgeon: Elsie Saas, MD;  Location: [facility];  Service: Orthopedics;  Laterality: Left;   TOTAL KNEE  ARTHROPLASTY Right 05/31/2016   Procedure: TOTAL KNEE ARTHROPLASTY;  Surgeon: Elsie Saas, MD;  Location: [facility];  Service: Orthopedics;  Laterality: Right;   TRANSURETHRAL RESECTION OF PROSTATE N/A 05/07/2014   Procedure: TRANSURETHRAL RESECTION OF THE PROSTATE WITH GYRUS INSTRUMENTS;  Surgeon: Malka So, MD;  Location: [facility];  Service: Urology;  Laterality: N/A;    Social History:  Social History   Socioeconomic History   Marital status: Married    Spouse name: Not on file   Number of children: 9   Years of education: Not on file   Highest education level: Not on file  Occupational History   Occupation: retire  Tobacco Use   Smoking status: Former    Packs/day: 0.25    Years: 3.00    Pack years: 0.75    Types: Cigarettes    Quit date: 04/12/1972    Years since quitting: 49.0   Smokeless tobacco: Never   Tobacco comments:    smoked 1 cigarette per day SOME DAYS  Vaping Use   Vaping Use: Never used  Substance and Sexual Activity   Alcohol use: Yes    Alcohol/week: 1.0 standard drink    Types: 1 Cans of beer per week    Comment: OCCASIONAL   Drug use: No   Sexual activity: Yes  Other Topics Concern   Not on file  Social History Narrative   Shawnie Dapper will take care of him after his surgery    Her cell is [phone]   Social Determinants of Health   Financial Resource Strain: Not on file  Food Insecurity: Not on file  Transportation Needs: Not on file  Physical Activity: Not on file  Stress: Not on file  Social Connections: Not on file  Intimate Partner Violence: Not on file    Family History:  Family History  Problem Relation Age of Onset   Stroke Father        age 60   Cancer Brother        neoplasm of brain    Medications:   Current Outpatient Medications on File Prior to Visit  Medication Sig Dispense Refill   acetaminophen (TYLENOL) 500 MG tablet Take 500 mg by mouth every 6 (six) hours as needed for mild pain or headache.     atorvastatin  (LIPITOR) 40 MG tablet Take 40 mg by mouth daily.     buPROPion (WELLBUTRIN XL) 150 MG 24 hr tablet Take 450 mg by mouth every morning.      ELIQUIS 5 MG TABS tablet Take 1 tablet by mouth twice daily 60 tablet 0   escitalopram (LEXAPRO) 5 MG tablet Take 1 tablet (5 mg total) by mouth daily. 30 tablet 3   felodipine (PLENDIL) 10 MG 24 hr tablet Take 1 tablet (10 mg total) by mouth daily. (Patient taking differently: Take 5 mg by mouth daily.)  hydrOXYzine (ATARAX) 10 MG tablet Take 1 tablet (10 mg total) by mouth every 6 (six) hours as needed for anxiety, itching or nausea. 75 tablet 0   LORazepam (ATIVAN) 0.5 MG tablet Take 1 tablet (0.5 mg total) by mouth every 8 (eight) hours. 30 tablet 0   losartan (COZAAR) 100 MG tablet Take 100 mg by mouth daily.     metoprolol succinate (TOPROL XL) 50 MG 24 hr tablet Take 2.5 tablets (125 mg total) by mouth daily. Take with or immediately following a meal. (Patient taking differently: Take 100 mg by mouth daily. Take with or immediately following a meal.) 75 tablet 0   Multiple Vitamin (MULTIVITAMIN WITH MINERALS) TABS tablet Take 1 tablet by mouth daily.     spironolactone (ALDACTONE) 25 MG tablet Take 1 tablet by mouth once daily (Patient taking differently: Take 12.5 mg by mouth daily.) 30 tablet 0   vitamin B-12 1000 MCG tablet Take 1 tablet (1,000 mcg total) by mouth daily.     Vitamin D, Ergocalciferol, (DRISDOL) 1.25 MG (50000 UNIT) CAPS capsule TAKE 1 CAPSULE BY MOUTH ONCE A WEEK ON  FRIDAYS 5 capsule 0   No current facility-administered medications on file prior to visit.    Allergies:   Allergies  Allergen Reactions   Amlodipine Other (See Comments)    Dizziness    Gabapentin     hallucinations      OBJECTIVE:  Physical Exam  Vitals:   04/30/21 1403  BP: 130/84  Pulse: (!) 58  Weight: 201 lb (91.2 kg)  Height: 6' (1.829 m)   Body mass index is 27.26 kg/m. No results found.  Post stroke PHQ 2/9 Depression screen PHQ 2/9  04/10/2021  Decreased Interest 2  Down, Depressed, Hopeless 0  PHQ - 2 Score 2  Altered sleeping 0  Tired, decreased energy 3  Change in appetite 0  Feeling bad or failure about yourself  0  Trouble concentrating 1  Moving slowly or fidgety/restless 0  Suicidal thoughts 0  PHQ-9 Score 6     General: well developed, well nourished, very pleasant elderly African-American male, seated, in no evident distress Head: head normocephalic and atraumatic.   Neck: supple with no carotid or supraclavicular bruits Cardiovascular: regular rate and rhythm, no murmurs Musculoskeletal: no deformity Skin:  no rash/petichiae Vascular:  Normal pulses all extremities   Neurologic Exam Mental Status: Awake and fully alert.  Fluent speech and language.  Oriented to place and time. Recent and remote memory intact. Attention span, concentration and fund of knowledge appropriate. Mood and affect appropriate.  Cranial Nerves: Fundoscopic exam reveals sharp disc margins. Pupils equal, briskly reactive to light. Extraocular movements full without nystagmus. Visual fields partial left homonymous hemianopia. Hearing intact. Facial sensation intact. Face, tongue, palate moves normally and symmetrically.  Motor: Normal bulk and tone. Normal strength in all tested extremity muscles except slight left hand decreased dexterity and left HF weakness Sensory.: intact to touch , pinprick , position and vibratory sensation.  Coordination: Rapid alternating movements normal in all extremities. Finger-to-nose and heel-to-shin performed accurately bilaterally. Gait and Station: Arises from chair without difficulty. Stance is normal. Gait demonstrates normal stride length although slightly decreased LLE step height without use of AD.  Difficulty performing tandem walk and heel toe.  Reflexes: 1+ and symmetric. Toes downgoing.      NIHSS  2 Modified Rankin  3      ASSESSMENT: Kaiyon Hynes. is a 76 y.o. year old male  subacute right PCA  infarct, embolic secondary to new onset A. fib on 02/27/2021. Vascular risk factors include new onset A. fib, HTN, HLD and advanced age.      PLAN:  Right PCA stroke:  Residual deficit: Left homonymous hemianopia and mild left hemiparesis. Referral placed to neuro rehab PT/OT. Plans to f/u with ophthalmology Continue Eliquis (apixaban) daily  and atorvastatin 20 mg daily for secondary stroke prevention.   Discussed secondary stroke prevention measures and importance of close PCP follow up for aggressive stroke risk factor management. I have gone over the pathophysiology of stroke, warning signs and symptoms, risk factors and their management in some detail with instructions to go to the closest emergency room for symptoms of concern. Atrial fibrillation, new dx: On Eliquis 5 mg twice daily for CHA2DS2-VASc score of at least 5. Followed by cardiology HTN: BP goal <130/90.  Stable on current regimen per PCP HLD: LDL goal <70. Recent LDL 58 on atorvastatin 20 mg daily per PCP.     Follow up in 4 months or call earlier if needed   CC:  [facility] provider: Dr. Leonie Man PCP: Allwardt, Randa Evens, PA-C    I spent 58 minutes of face-to-face and non-face-to-face time with patient and wife.  This included previsit chart review including review of recent hospitalization, lab review, study review, order entry, electronic health record documentation, patient and wife education regarding recent stroke including etiology, secondary stroke prevention measures and importance of managing stroke risk factors, residual deficits and typical recovery time and answered all other questions to patient and wife's satisfaction  Frann Rider, AGNP-BC  [facility] [address] [place], [place] [address]  Phone [phone] Fax [phone] Note: This document was prepared with digital dictation and possible smart phrase technology. Any transcriptional errors that result  from this process are unintentional.

## 2021-05-01 ENCOUNTER — Other Ambulatory Visit: Payer: Self-pay | Admitting: Physical Medicine and Rehabilitation

## 2021-05-04 ENCOUNTER — Encounter: Payer: Self-pay | Admitting: Physician Assistant

## 2021-05-05 NOTE — Telephone Encounter (Signed)
Patient wife called back about message below.

## 2021-05-08 DIAGNOSIS — F411 Generalized anxiety disorder: Secondary | ICD-10-CM | POA: Diagnosis not present

## 2021-05-08 DIAGNOSIS — Z79891 Long term (current) use of opiate analgesic: Secondary | ICD-10-CM | POA: Diagnosis not present

## 2021-05-08 DIAGNOSIS — F330 Major depressive disorder, recurrent, mild: Secondary | ICD-10-CM | POA: Diagnosis not present

## 2021-05-09 ENCOUNTER — Other Ambulatory Visit: Payer: Self-pay | Admitting: Physical Medicine and Rehabilitation

## 2021-05-12 ENCOUNTER — Encounter: Payer: Medicare Other | Admitting: Physical Medicine and Rehabilitation

## 2021-05-13 ENCOUNTER — Ambulatory Visit: Payer: Medicare Other | Admitting: Physical Therapy

## 2021-05-13 ENCOUNTER — Ambulatory Visit: Payer: Medicare Other | Attending: Physician Assistant | Admitting: Occupational Therapy

## 2021-05-13 ENCOUNTER — Encounter: Payer: Self-pay | Admitting: Physical Therapy

## 2021-05-13 ENCOUNTER — Other Ambulatory Visit: Payer: Self-pay

## 2021-05-13 VITALS — BP 139/78 | HR 60

## 2021-05-13 DIAGNOSIS — R260 Ataxic gait: Secondary | ICD-10-CM | POA: Insufficient documentation

## 2021-05-13 DIAGNOSIS — R278 Other lack of coordination: Secondary | ICD-10-CM | POA: Diagnosis not present

## 2021-05-13 DIAGNOSIS — R2681 Unsteadiness on feet: Secondary | ICD-10-CM | POA: Diagnosis not present

## 2021-05-13 DIAGNOSIS — R270 Ataxia, unspecified: Secondary | ICD-10-CM | POA: Diagnosis not present

## 2021-05-13 DIAGNOSIS — I6300 Cerebral infarction due to thrombosis of unspecified precerebral artery: Secondary | ICD-10-CM | POA: Insufficient documentation

## 2021-05-13 DIAGNOSIS — R414 Neurologic neglect syndrome: Secondary | ICD-10-CM

## 2021-05-13 DIAGNOSIS — R2689 Other abnormalities of gait and mobility: Secondary | ICD-10-CM | POA: Insufficient documentation

## 2021-05-13 NOTE — Therapy (Signed)
[facility] [address] [place], [place], [address] Phone: [phone]   Fax:  [phone]  Physical Therapy Evaluation  Patient Details  Name: George [name]. MRN: [id number] Date of Birth: 10-28-1945 Referring Provider (PT): Frann Rider, NP   Encounter Date: 05/13/2021   PT End of Session - 05/13/21 1414     Visit Number 1    Number of Visits 17    Date for PT Re-Evaluation 08/11/21    Authorization Type Medicare part A & B - will need 10th visit PN.    PT Start Time 1315    PT Stop Time 1357    PT Time Calculation (min) 42 min    Equipment Utilized During Treatment Gait belt    Activity Tolerance Patient tolerated treatment well    Behavior During Therapy WFL for tasks assessed/performed             Past Medical History:  Diagnosis Date   Anxiety    Arthritis    OSTEO IN KNEE   Asthma    as child   Colon cancer ([place])    Depression    Elevated prostate specific antigen (PSA)    Generalized anxiety disorder 04/09/2015   Hematospermia    Hx antineoplastic chemotherapy 2010   Hypertension    Idiopathic peripheral neuropathy    TOES OF BOTH FEET DUE TO CHEMO   Incomplete bladder emptying    Malignant neoplasm of prostate ([place])    Nodular prostate with lower urinary tract symptoms    Panic attacks 04/09/2015   Primary localized osteoarthrosis of the knee, right 05/19/2016   Prostatitis    S/P total knee arthroplasty, left 05/19/2016   Spermatocele    Weak urinary stream     Past Surgical History:  Procedure Laterality Date   COLON RESECTION   NOV 2009   COLONOSCOPY     PROSTATE BIOPSY     SPERMATOCELECTOMY Left 05/07/2014   Procedure: LEFT SPERMATOCELECTOMY;  Surgeon: Malka So, MD;  Location: [facility];  Service: Urology;  Laterality: Left;   TONSILLECTOMY     TOTAL KNEE ARTHROPLASTY Left 04/21/2015   Procedure: LEFT TOTAL KNEE ARTHROPLASTY;  Surgeon: Elsie Saas, MD;  Location: [facility];   Service: Orthopedics;  Laterality: Left;   TOTAL KNEE ARTHROPLASTY Right 05/31/2016   Procedure: TOTAL KNEE ARTHROPLASTY;  Surgeon: Elsie Saas, MD;  Location: [facility];  Service: Orthopedics;  Laterality: Right;   TRANSURETHRAL RESECTION OF PROSTATE N/A 05/07/2014   Procedure: TRANSURETHRAL RESECTION OF THE PROSTATE WITH GYRUS INSTRUMENTS;  Surgeon: Malka So, MD;  Location: [facility];  Service: Urology;  Laterality: N/A;    Vitals:   05/13/21 1333  BP: 139/78  Pulse: 60      Subjective Assessment - 05/13/21 1318     Subjective Pt with subacute right PCA infarct, embolic secondary to new onset A. fib on 02/27/2021. Received inpatient rehab and was discharged home on 03/14/21. No falls since he has been home. Trips and is clumsy a lot. Has not used the walker since he has been home from the hospital. Wife helps with preparing medications, making meals. Pt able to bathe himself and put on his own clothes. Sometimes when he stands up, it takes a little bit of time for him to get his balance. Has poor coordination.    Pertinent History PMH: subacute right PCA infarct (02/2021),  h/o colon cancer s/p resection, prostate cancer, anxiety/depression, osteoarthritis of bilateral knees, peripheral neuropathy.  Patient Stated Goals Wants to get back to where he was prior to CVA.    Currently in Pain? No/denies                [facility] PT Assessment - 05/13/21 1322       Assessment   Medical Diagnosis Rt PCA infarct   w/ homonymous hemianopsia   Referring Provider (PT) Frann Rider, NP    Onset Date/Surgical Date 02/27/21    Hand Dominance Right    Prior Therapy Inpatient rehab      Precautions   Precautions Fall    Precaution Comments slightly hard of hearing      Balance Screen   Has the patient fallen in the past 6 months Yes    How many times? 1   when pt had his CVA   Has the patient had a decrease in activity level because of a fear of falling?  No    Is the patient reluctant to  leave their home because of a fear of falling?  No      Home Environment   Living Environment Private residence    Living Arrangements Spouse/significant other    Type of [place] Access Level entry    Home Layout One level    [place] - 2 wheels;Grab bars - toilet;Grab bars - tub/shower;Bedside commode    Additional Comments Wife helps with medication management and meal prep.      Prior Function   Level of Independence Independent    Vocation Retired    Dietitian work, carpentry      Observation/Other Assessments   Focus on Therapeutic Outcomes ([place])  N/A - staff did not capture.      Sensation   Light Touch Appears Intact   BLE   Additional Comments impaired fingertips mostly Lt long and ring finger      Coordination   Gross Motor Movements are Fluid and Coordinated No    Fine Motor Movements are Fluid and Coordinated No    Coordination and Movement Description impaired RAM with LUE    Finger Nose Finger Test ataxic/dysmetria LUE    Heel Shin Test slightly slower LLE      ROM / Strength   AROM / PROM / Strength Strength      Strength   Strength Assessment Site Hip;Knee;Ankle    Right/Left Hip Right;Left    Right Hip Flexion 5/5    Left Hip Flexion 5/5    Right/Left Knee Right;Left    Right Knee Flexion 5/5    Right Knee Extension 5/5    Left Knee Flexion 5/5    Left Knee Extension 5/5    Right/Left Ankle Right;Left    Right Ankle Dorsiflexion 4+/5    Left Ankle Dorsiflexion 4+/5      Transfers   Transfers Sit to Stand;Stand to Sit    Sit to Stand 5: Supervision    Five time sit to stand comments  10.94 seconds with no UE support    Stand to Sit 5: Supervision    Comments Pt reports feeling more unsteady when initially standing before going to walk      Ambulation/Gait   Ambulation/Gait Yes    Ambulation/Gait Assistance 4: Min guard   clinic distances   Assistive device None    Gait Pattern Step-through pattern;Ataxic;Narrow  base of support;Decreased arm swing - left;Decreased step length - left;Decreased dorsiflexion - left;Decreased dorsiflexion - right   bilat toe in  Ambulation Surface Level;Indoor    Gait velocity 10.22 seconds = 3.21 ft/sec      Standardized Balance Assessment   Standardized Balance Assessment Timed Up and Go Test;Berg Balance Test      Berg Balance Test   Sit to Stand Able to stand without using hands and stabilize independently    Standing Unsupported Able to stand safely 2 minutes    Sitting with Back Unsupported but Feet Supported on Floor or Stool Able to sit safely and securely 2 minutes    Stand to Sit Sits safely with minimal use of hands    Transfers Able to transfer safely, minor use of hands    Standing Unsupported with Eyes Closed Able to stand 10 seconds with supervision    Standing Unsupported with Feet Together Able to place feet together independently and stand 1 minute safely    From Standing, Reach Forward with Outstretched Arm Can reach confidently >25 cm (10")    From Standing Position, Pick up Object from Floor Able to pick up shoe, needs supervision    From Standing Position, Turn to Look Behind Over each Shoulder Looks behind from both sides and weight shifts well    Turn 360 Degrees Able to turn 360 degrees safely but slowly   to R; 4.19 seconds, to L; 4.64   Standing Unsupported, Alternately Place Feet on Step/Stool Able to complete >2 steps/needs minimal assist    Standing Unsupported, One Foot in Front Able to plae foot ahead of the other independently and hold 30 seconds    Standing on One Leg Tries to lift leg/unable to hold 3 seconds but remains standing independently   on RLE   Total Score 45    Berg comment: significant fall risk      Timed Up and Go Test   Normal TUG (seconds) 9.28   with no AD   TUG Comments close min guard esp when turning, pt with one episode of almost scissoring                        Objective measurements  completed on examination: See above findings.                PT Education - 05/13/21 1413     Education Details Clinical findings, POC, need for use of AD due to fall risk based on BERG and min guard provided from therapist with gait with no AD. Pt seems unaware of deficits and reports that he won't fall.    Person(s) Educated Patient    Methods Explanation    Comprehension Verbalized understanding              PT Short Term Goals - 05/13/21 1416       PT SHORT TERM GOAL #1   Title Pt will be independent with initial HEP with wife's supervision in order to build upon functional gains made in therapy. ALL STGS DUE 06/10/21    Time 4    Period Weeks    Status New    Target Date 06/10/21      PT SHORT TERM GOAL #2   Title Pt will improve BERG score to at least a 49/56 in order to demo decr fall risk.    Baseline 45/56    Time 4    Status New      PT SHORT TERM GOAL #3   Title Pt will undergo further assessment of DGI with LTG written.  Time 4    Period Weeks    Status New      PT SHORT TERM GOAL #4   Title Pt will ambulate at least 230' over level indoor surfaces with LRAD vs. no AD with supervision in order to demo improved household mobility.    Baseline min guard over level surfaces with no AD    Time 4    Period Weeks    Status New      PT SHORT TERM GOAL #5   Title mCTSIB to be assessed with LTG written in order to demo improved balance.    Time 4    Period Weeks    Status New      Additional Short Term Goals   Additional Short Term Goals Yes      PT SHORT TERM GOAL #6   Title Pt will perform TUG in 10 seconds or less with no AD vs. LRAD with supervision in order to demo decr fall risk/improved functional mobility.    Baseline 9.28 seconds with no AD and close min guard for balance.    Time 4    Period Weeks    Status New               PT Long Term Goals - 05/13/21 1419       PT LONG TERM GOAL #1   Title Pt will be independent with  final HEP with wife's supervision in order to build upon functional gains made in therapy. ALL LTGS DUE 07/08/21    Time 8    Period Weeks    Status New    Target Date 07/08/21      PT LONG TERM GOAL #2   Title DGI goal to be written as appropriate to demo decr fall risk.    Time 8    Period Weeks    Status New      PT LONG TERM GOAL #3   Title mCTSIB goal to be written in order to demo improved balance strategies.    Time 8    Period Weeks    Status New      PT LONG TERM GOAL #4   Title Pt will ambulate at least 500' over unlevel surfaces outdoors with no AD vs. LRAD with supervision in order to demo improved community mobility.    Time 8    Period Weeks    Status New      PT LONG TERM GOAL #5   Title Pt will maintain gait speed of 3.2 ft/sec with no AD vs. LRAD with mod I in order to demo improved community mobility.    Baseline 3.21 ft/sec with no AD but with min guard for balance.    Time 8    Period Weeks                    Plan - 05/13/21 1423     Clinical Impression Statement Patient is a 76 year old male referred to Neuro OPPT for  subacute right PCA infarct with left homonymous hemianopia. Pt's PMH is significant for: h/o colon cancer s/p resection, prostate cancer, anxiety/depression, osteoarthritis of bilateral knees, peripheral neuropathy. The following deficits were present during the exam: impaired coordination LUE/LLE, impaired sensation, gait abnormalities, gait abnormalities (impared coordination/timing), ataxia, impaired balance, decr safety awareness. Based on BERG pt is an incr risk for falls. Pt's gait speed and TUG score does not put him at an incr risk of falls, but needs close min  guard for safety due to gait impairments/ataxia as pt with tendency to move too quickly with no AD.  Pt would benefit from skilled PT to address these impairments and functional limitations to maximize functional mobility independence and decr fall risk.    Personal  Factors and Comorbidities Comorbidity 3+;Past/Current Experience;Time since onset of injury/illness/exacerbation;Behavior Pattern    Comorbidities h/o colon cancer s/p resection, prostate cancer, anxiety/depression, osteoarthritis of bilateral knees, peripheral neuropathy.    Examination-Activity Limitations Locomotion Level;Stairs;Transfers;Bend    Examination-Participation Restrictions Community Activity;Driving;Meal Prep;Medication Management    Stability/Clinical Decision Making Evolving/Moderate complexity    Clinical Decision Making Moderate    Rehab Potential Good    PT Frequency 2x / week    PT Duration 12 weeks    PT Treatment/Interventions ADLs/Self Care Home Management;Aquatic Therapy;DME Instruction;Gait training;Stair training;Functional mobility training;Therapeutic activities;Neuromuscular re-education;Balance training;Therapeutic exercise;Patient/family education;Vestibular    PT Next Visit Plan Initial HEP - standing balance with SLS tasks, narrow BOS, weight shifting, eyes closed/unlevel surfaces. Work on balance, try gait with cane.    Consulted and Agree with Plan of Care Patient             Patient will benefit from skilled therapeutic intervention in order to improve the following deficits and impairments:  Abnormal gait, Decreased balance, Decreased activity tolerance, Decreased coordination, Decreased knowledge of precautions, Decreased knowledge of use of DME, Decreased mobility, Decreased safety awareness, Decreased strength, Impaired sensation  Visit Diagnosis: Other lack of coordination  Ataxic gait  Unsteadiness on feet  Other abnormalities of gait and mobility     Problem List Patient Active Problem List   Diagnosis Date Noted   AKI (acute kidney injury) ([place])    Dyslipidemia    Essential hypertension    Hemiparesis affecting left side as late effect of stroke ([place])    Subcortical infarction ([place]) 03/05/2021   Recurrent major depressive disorder,  in partial remission ([place])    CVA (cerebral vascular accident) ([place]) 02/27/2021   Atrial fibrillation with RVR ([place]) 02/27/2021   Hypokalemia 02/27/2021   Prolonged QT interval 02/27/2021   Polyneuropathy 01/31/2018   Chemotherapy-induced neuropathy ([place]) 01/31/2018   B12 deficiency 01/31/2018   Prediabetes 01/31/2018   Basal ganglia infarction ([place]) 01/31/2018   SBO (small bowel obstruction) ([place]) 10/20/2017   Primary localized osteoarthritis of right knee 05/31/2016   Primary localized osteoarthrosis of the knee, right 05/19/2016   S/P total knee arthroplasty, left 05/19/2016   Dyspnea 01/20/2016   DJD (degenerative joint disease) of knee 04/21/2015   Primary localized osteoarthritis of left knee 04/09/2015   Generalized anxiety disorder 04/09/2015   Panic attacks 04/09/2015   Spermatocele 05/08/2014   Benign localized hyperplasia of prostate with urinary obstruction 05/07/2014   Malignant neoplasm of prostate ([place]) 04/22/2014    Arliss Journey, PT, DPT  05/13/2021, 2:32 PM  [facility] [address] [place], [place], [address] Phone: [phone]   Fax:  [phone]  Name: George [name]. MRN: [id number] Date of Birth: [birth year]

## 2021-05-13 NOTE — Therapy (Signed)
[facility] [address] [place], [place], [address] Phone: [phone]   Fax:  [phone]  Occupational Therapy Evaluation  Patient Details  Name: George [name]. MRN: [id number] Date of Birth: 11-12-1945 Referring Provider (OT): Frann Rider   Encounter Date: 05/13/2021   OT End of Session - 05/13/21 1326     Visit Number 1    Number of Visits 13    Date for OT Re-Evaluation 06/24/21    Authorization Type MCR, [employer] - covered 100%    OT Start Time 1215    OT Stop Time 1310    OT Time Calculation (min) 55 min    Activity Tolerance Patient tolerated treatment well    Behavior During Therapy WFL for tasks assessed/performed             Past Medical History:  Diagnosis Date   Anxiety    Arthritis    OSTEO IN KNEE   Asthma    as child   Colon cancer ([place])    Depression    Elevated prostate specific antigen (PSA)    Generalized anxiety disorder 04/09/2015   Hematospermia    Hx antineoplastic chemotherapy 2010   Hypertension    Idiopathic peripheral neuropathy    TOES OF BOTH FEET DUE TO CHEMO   Incomplete bladder emptying    Malignant neoplasm of prostate ([place])    Nodular prostate with lower urinary tract symptoms    Panic attacks 04/09/2015   Primary localized osteoarthrosis of the knee, right 05/19/2016   Prostatitis    S/P total knee arthroplasty, left 05/19/2016   Spermatocele    Weak urinary stream     Past Surgical History:  Procedure Laterality Date   COLON RESECTION   NOV 2009   COLONOSCOPY     PROSTATE BIOPSY     SPERMATOCELECTOMY Left 05/07/2014   Procedure: LEFT SPERMATOCELECTOMY;  Surgeon: Malka So, MD;  Location: [facility];  Service: Urology;  Laterality: Left;   TONSILLECTOMY     TOTAL KNEE ARTHROPLASTY Left 04/21/2015   Procedure: LEFT TOTAL KNEE ARTHROPLASTY;  Surgeon: Elsie Saas, MD;  Location: [facility];  Service: Orthopedics;  Laterality: Left;   TOTAL KNEE ARTHROPLASTY  Right 05/31/2016   Procedure: TOTAL KNEE ARTHROPLASTY;  Surgeon: Elsie Saas, MD;  Location: [facility];  Service: Orthopedics;  Laterality: Right;   TRANSURETHRAL RESECTION OF PROSTATE N/A 05/07/2014   Procedure: TRANSURETHRAL RESECTION OF THE PROSTATE WITH GYRUS INSTRUMENTS;  Surgeon: Malka So, MD;  Location[facility];  Service: Urology;  Laterality: N/A;    There were no vitals filed for this visit.   Subjective Assessment - 05/13/21 1219     Pertinent History Rt PCA infarct 02/27/21 w/ homonymous hemianopsia. PMH: h/o colon CA s/p resection, prostate CA, OA bilateral knees    Patient Stated Goals To be able to walk again like I was before    Currently in Pain? No/denies   occasional low back pain at night or in the am              [facility] OT Assessment - 05/13/21 0001       Assessment   Medical Diagnosis Rt PCA infarct   w/ homonymous hemianopsia   Referring Provider (OT) Frann Rider    Onset Date/Surgical Date 02/27/21    Hand Dominance Right      Precautions   Precautions Fall      Balance Screen   Has the patient fallen in the past  6 months No      Home  Geophysicist/field seismologist bars   Additional Comments Pt lives w/ wife in 1 level home w/ no steps to enter      Prior Function   Level of Independence Independent    Vocation Retired    Dietitian work, carpentry      ADL   Eating/Feeding Modified independent    Grooming Independent    Designer, television/film set - Social research officer, government -  Product/process development scientist Modified independent   w/ grab bars     IADL   Shopping Needs to be accompanied on any shopping trip    [place] --   Pt does all I'ly   Meal Prep --   Pt reports doing 10% of cooking, wife does 90% (even  prior to stroke) however question safety d/t decreased sensation Lt hand/fingertips and decreased attention to [employer] on family or friends for transportation    Medication Management Is responsible for taking medication in correct dosages at correct time    Financial Management --   wife always did     Mobility   Mobility Status Independent   but ataxic     Written Expression   Dominant Hand Right    Handwriting --   no changes     Vision - History   Baseline Vision Wears glasses all the time      Vision Assessment   Convergence Impaired (comment)   Lt eye w/ decreased convergence   Visual Fields No apparent deficits   w/ double simultaneous stimulation however will monitor in functional tasks. Pt may have more inattention to Lt than a field cut, but will need perimetry testing and further observation to determine   Comment letter cancellation (70M print size) for visual scanning w/ approx 60 - 70% accuracy (      Cognition   Overall Cognitive Status Difficult to assess    Cognition Comments Pt reports occasionally forgetting to turn to water off or leaving fridge door open (worse since stroke but reports doing some prior to stroke)      Observation/Other Assessments   Observations OA DIP joints bilateral hands      Sensation   Additional Comments impaired fingertips mostly Lt long and ring finger      Coordination   Gross Motor Movements are Fluid and Coordinated No   slightly ataxic LUE however still able to toss/catch ball   Fine Motor Movements are Fluid and Coordinated No    9 Hole Peg Test Right;Left    Right 9 Hole Peg Test 32.44 sec    Left 9 Hole Peg Test 74.87 sec      Perception   Perception Impaired    Inattention/Neglect Does not attend to left visual field   mild impairment - pt reports attending more to things in Lt hand     Edema   Edema none      ROM / Strength   AROM / PROM / Strength AROM;Strength      AROM   Overall AROM  Comments BUE AROM WFL's      Strength   Overall Strength Comments WNL's Lt shoulder  appeared slightly stronger than Rt shoulder     Hand Function   Right Hand Grip (lbs) 106.4 lbs    Left Hand Grip (lbs) 109.3 lbs                                OT Short Term Goals - 05/13/21 1331       OT SHORT TERM GOAL #1   Title Pt to be independent with coordination HEP    Time 3    Period Weeks    Status New      OT SHORT TERM GOAL #2   Title Pt to verbalize understanding with visual scanning strategies and compensations for decreased sensation Lt hand    Time 3    Period Weeks    Status New      OT SHORT TERM GOAL #3   Title Improve coordination Lt hand as evidenced by reducing speed on 9 hole peg test to 60 sec. or less    Baseline 74.87 sec    Time 3    Period Weeks    Status New      OT SHORT TERM GOAL #4   Title Pt to perform tabletop scanning tasks at 90% accuracy or greater    Time 3    Period Weeks    Status New               OT Long Term Goals - 05/13/21 1334       OT LONG TERM GOAL #1   Title Pt to improve coordination Lt hand as evidenced by performing 9 hole peg test in 50 sec or less    Baseline 74.87 sec    Time 6    Period Weeks    Status New      OT LONG TERM GOAL #2   Title Pt to perform environmental scanning at 85% or greater accuracy in prep for potential return to driving    Time 6    Period Weeks    Status New      OT LONG TERM GOAL #3   Title Pt to perform dynamic standing tasks for IADLS w/o LOB    Time 6    Period Weeks    Status New      OT LONG TERM GOAL #4   Title Pt to cook simple meal safely attending to Lt side of body    Time 6    Period Weeks    Status New                   Plan - 05/13/21 1327     Clinical Impression Statement Pt is a 76 y.o. male who presents to [facility] for evaluation due to Rt PCA infarct on 02/27/21. Pt referred with diagnosis Lt homonymous hemianopsia. Pt also w/ mild  inattention to Lt side, decreased coordination, and ataxia and would benefit from O.T. to address these deficits and improve overall function and safety. PMH includes: h/o colon CA s/p resection, prostate Ca, OA bilateral knees    OT Occupational Profile and History Problem Focused Assessment - Including review of records relating to presenting problem    Occupational performance deficits (Please refer to evaluation for details): IADL's    Body Structure / Function / Physical Skills Dexterity;UE functional use;GMC;IADL;Coordination;FMC;Sensation    Cognitive Skills Attention    Rehab Potential Good    Clinical Decision Making Several treatment options, min-mod task  modification necessary    Comorbidities Affecting Occupational Performance: May have comorbidities impacting occupational performance    Modification or Assistance to Complete Evaluation  No modification of tasks or assist necessary to complete eval    OT Frequency 2x / week    OT Duration 6 weeks   plus eval   OT Treatment/Interventions Therapeutic activities;Cognitive remediation/compensation;Coping strategies training;Therapeutic exercise;Visual/perceptual remediation/compensation;Functional Mobility Training;Patient/family education    Plan coordination HEP, visual scanning strategies    Consulted and Agree with Plan of Care Patient             Patient will benefit from skilled therapeutic intervention in order to improve the following deficits and impairments:   Body Structure / Function / Physical Skills: Dexterity, UE functional use, GMC, IADL, Coordination, FMC, Sensation Cognitive Skills: Attention     Visit Diagnosis: Other lack of coordination  Ataxia  Neurological neglect syndrome  Unsteadiness on feet    Problem List Patient Active Problem List   Diagnosis Date Noted   AKI (acute kidney injury) ([place])    Dyslipidemia    Essential hypertension    Hemiparesis affecting left side as late effect of  stroke ([place])    Subcortical infarction ([place]) 03/05/2021   Recurrent major depressive disorder, in partial remission ([place])    CVA (cerebral vascular accident) ([place]) 02/27/2021   Atrial fibrillation with RVR ([place]) 02/27/2021   Hypokalemia 02/27/2021   Prolonged QT interval 02/27/2021   Polyneuropathy 01/31/2018   Chemotherapy-induced neuropathy ([place]) 01/31/2018   B12 deficiency 01/31/2018   Prediabetes 01/31/2018   Basal ganglia infarction ([place]) 01/31/2018   SBO (small bowel obstruction) ([place]) 10/20/2017   Primary localized osteoarthritis of right knee 05/31/2016   Primary localized osteoarthrosis of the knee, right 05/19/2016   S/P total knee arthroplasty, left 05/19/2016   Dyspnea 01/20/2016   DJD (degenerative joint disease) of knee 04/21/2015   Primary localized osteoarthritis of left knee 04/09/2015   Generalized anxiety disorder 04/09/2015   Panic attacks 04/09/2015   Spermatocele 05/08/2014   Benign localized hyperplasia of prostate with urinary obstruction 05/07/2014   Malignant neoplasm of prostate ([place]) 04/22/2014    Carey Bullocks, OTR/L 05/13/2021, 1:39 PM  [facility] [address] [place], [place], [address] Phone: [phone]   Fax:  [phone]  Name: George [name]. MRN: [id number] Date of Birth: 13-Feb-1946

## 2021-05-13 NOTE — Addendum Note (Signed)
Addended by: Hans Eden on: 05/13/2021 01:47 PM   Modules accepted: Orders

## 2021-05-14 ENCOUNTER — Other Ambulatory Visit: Payer: Self-pay | Admitting: Physician Assistant

## 2021-05-18 DIAGNOSIS — H2513 Age-related nuclear cataract, bilateral: Secondary | ICD-10-CM | POA: Diagnosis not present

## 2021-05-18 DIAGNOSIS — N1832 Chronic kidney disease, stage 3b: Secondary | ICD-10-CM | POA: Diagnosis not present

## 2021-05-26 ENCOUNTER — Ambulatory Visit: Payer: Medicare Other | Admitting: Occupational Therapy

## 2021-05-26 ENCOUNTER — Ambulatory Visit: Payer: Medicare Other | Admitting: Physical Therapy

## 2021-05-26 ENCOUNTER — Other Ambulatory Visit: Payer: Self-pay

## 2021-05-26 DIAGNOSIS — R278 Other lack of coordination: Secondary | ICD-10-CM

## 2021-05-26 DIAGNOSIS — R2681 Unsteadiness on feet: Secondary | ICD-10-CM

## 2021-05-26 DIAGNOSIS — R260 Ataxic gait: Secondary | ICD-10-CM

## 2021-05-26 DIAGNOSIS — R2689 Other abnormalities of gait and mobility: Secondary | ICD-10-CM

## 2021-05-26 DIAGNOSIS — R414 Neurologic neglect syndrome: Secondary | ICD-10-CM

## 2021-05-26 DIAGNOSIS — I6300 Cerebral infarction due to thrombosis of unspecified precerebral artery: Secondary | ICD-10-CM

## 2021-05-26 DIAGNOSIS — R270 Ataxia, unspecified: Secondary | ICD-10-CM | POA: Diagnosis not present

## 2021-05-26 NOTE — Therapy (Signed)
[facility] [address] [place], [place], [address] Phone: [phone]   Fax:  [phone]  Occupational Therapy Treatment  Patient Details  Name: George [name]. MRN: [id number] Date of Birth: 05/10/1945 Referring Provider (OT): Frann Rider   Encounter Date: 05/26/2021   OT End of Session - 05/26/21 1308     Visit Number 2    Number of Visits 13    Date for OT Re-Evaluation 06/24/21    Authorization Type MCR, [employer] - covered 100%    OT Start Time 1230    OT Stop Time 1315    OT Time Calculation (min) 45 min    Activity Tolerance Patient tolerated treatment well    Behavior During Therapy WFL for tasks assessed/performed             Past Medical History:  Diagnosis Date   Anxiety    Arthritis    OSTEO IN KNEE   Asthma    as child   Colon cancer ([place])    Depression    Elevated prostate specific antigen (PSA)    Generalized anxiety disorder 04/09/2015   Hematospermia    Hx antineoplastic chemotherapy 2010   Hypertension    Idiopathic peripheral neuropathy    TOES OF BOTH FEET DUE TO CHEMO   Incomplete bladder emptying    Malignant neoplasm of prostate ([facility])    Nodular prostate with lower urinary tract symptoms    Panic attacks 04/09/2015   Primary localized osteoarthrosis of the knee, right 05/19/2016   Prostatitis    S/P total knee arthroplasty, left 05/19/2016   Spermatocele    Weak urinary stream     Past Surgical History:  Procedure Laterality Date   COLON RESECTION   NOV 2009   COLONOSCOPY     PROSTATE BIOPSY     SPERMATOCELECTOMY Left 05/07/2014   Procedure: LEFT SPERMATOCELECTOMY;  Surgeon: Malka So, MD;  [facility];  Service: Urology;  Laterality: Left;   TONSILLECTOMY     TOTAL KNEE ARTHROPLASTY Left 04/21/2015   Procedure: LEFT TOTAL KNEE ARTHROPLASTY;  Surgeon: Elsie Saas, MD;  Location: [facility];  Service: Orthopedics;  Laterality: Left;   TOTAL KNEE ARTHROPLASTY  Right 05/31/2016   Procedure: TOTAL KNEE ARTHROPLASTY;  Surgeon: Elsie Saas, MD;  Location: [facility];  Service: Orthopedics;  Laterality: Right;   TRANSURETHRAL RESECTION OF PROSTATE N/A 05/07/2014   Procedure: TRANSURETHRAL RESECTION OF THE PROSTATE WITH GYRUS INSTRUMENTS;  Surgeon: Malka So, MD;  [facility];  Service: Urology;  Laterality: N/A;    There were no vitals filed for this visit.   Subjective Assessment - 05/26/21 1233     Subjective  I had an anxiety attack when I first got here.    Pertinent History Rt PCA infarct 02/27/21 w/ homonymous hemianopsia. PMH: h/o colon CA s/p resection, prostate CA, OA bilateral knees    Patient Stated Goals To be able to walk again like I was before    Currently in Pain? No/denies             Pt issued coordination HEP and visual scanning strategies d/t Lt inattention. See pt instructions for details.  Pt required cues to use Lt hand as he would switch to Rt hand at times during activities.  Simple word search w/ extra time required - did not finish d/t time constraints (issued remainder as homework)   When pt went to leave, did not finish threading LUE through sleeve of  jacket d/t Lt inattention and decreased awareness. Pt also went to back of gym to exit (exit in opposite direction). Pt also self d/c cane however ataxic w/ gait                     OT Education - 05/26/21 1252     Education Details coordination HEP, visual scanning strategies    Person(s) Educated Patient    Methods Explanation;Demonstration;Handout;Verbal cues    Comprehension Verbalized understanding;Returned demonstration;Verbal cues required              OT Short Term Goals - 05/26/21 1309       OT SHORT TERM GOAL #1   Title Pt to be independent with coordination HEP    Time 3    Period Weeks    Status On-going      OT SHORT TERM GOAL #2   Title Pt to verbalize understanding with visual scanning strategies and compensations  for decreased sensation Lt hand    Time 3    Period Weeks    Status On-going      OT SHORT TERM GOAL #3   Title Improve coordination Lt hand as evidenced by reducing speed on 9 hole peg test to 60 sec. or less    Baseline 74.87 sec    Time 3    Period Weeks    Status New      OT SHORT TERM GOAL #4   Title Pt to perform tabletop scanning tasks at 90% accuracy or greater    Time 3    Period Weeks    Status New               OT Long Term Goals - 05/13/21 1334       OT LONG TERM GOAL #1   Title Pt to improve coordination Lt hand as evidenced by performing 9 hole peg test in 50 sec or less    Baseline 74.87 sec    Time 6    Period Weeks    Status New      OT LONG TERM GOAL #2   Title Pt to perform environmental scanning at 85% or greater accuracy in prep for potential return to driving    Time 6    Period Weeks    Status New      OT LONG TERM GOAL #3   Title Pt to perform dynamic standing tasks for IADLS w/o LOB    Time 6    Period Weeks    Status New      OT LONG TERM GOAL #4   Title Pt to cook simple meal safely attending to Lt side of body    Time 6    Period Weeks    Status New                   Plan - 05/26/21 1309     Clinical Impression Statement Pt requires cueing to use Lt hand for coordination HEP as pt often would switch to Rt hand. Pt also with decreased memory and required cues for something therapist had just mentioned    OT Occupational Profile and History Problem Focused Assessment - Including review of records relating to presenting problem    Occupational performance deficits (Please refer to evaluation for details): IADL's    Body Structure / Function / Physical Skills Dexterity;UE functional use;GMC;IADL;Coordination;FMC;Sensation    Cognitive Skills Attention    Rehab Potential Good    Clinical Decision  Making Several treatment options, min-mod task modification necessary    Comorbidities Affecting Occupational Performance: May  have comorbidities impacting occupational performance    Modification or Assistance to Complete Evaluation  No modification of tasks or assist necessary to complete eval    OT Frequency 2x / week    OT Duration 6 weeks   plus eval   OT Treatment/Interventions Therapeutic activities;Cognitive remediation/compensation;Coping strategies training;Therapeutic exercise;Visual/perceptual remediation/compensation;Functional Mobility Training;Patient/family education    Plan review HEP and visual strategies, try puzzle, if time - environmental scanning (use gait belt d/t fall risk)   Consulted and Agree with Plan of Care Patient             Patient will benefit from skilled therapeutic intervention in order to improve the following deficits and impairments:   Body Structure / Function / Physical Skills: Dexterity, UE functional use, GMC, IADL, Coordination, FMC, Sensation Cognitive Skills: Attention     Visit Diagnosis: Other lack of coordination  Neurological neglect syndrome    Problem List Patient Active Problem List   Diagnosis Date Noted   AKI (acute kidney injury) ([place])    Dyslipidemia    Essential hypertension    Hemiparesis affecting left side as late effect of stroke ([place])    Subcortical infarction ([place]) 03/05/2021   Recurrent major depressive disorder, in partial remission ([place])    CVA (cerebral vascular accident) ([place]) 02/27/2021   Atrial fibrillation with RVR ([place]) 02/27/2021   Hypokalemia 02/27/2021   Prolonged QT interval 02/27/2021   Polyneuropathy 01/31/2018   Chemotherapy-induced neuropathy ([place]) 01/31/2018   B12 deficiency 01/31/2018   Prediabetes 01/31/2018   Basal ganglia infarction ([place]) 01/31/2018   SBO (small bowel obstruction) ([place]) 10/20/2017   Primary localized osteoarthritis of right knee 05/31/2016   Primary localized osteoarthrosis of the knee, right 05/19/2016   S/P total knee arthroplasty, left 05/19/2016   Dyspnea 01/20/2016   DJD (degenerative  joint disease) of knee 04/21/2015   Primary localized osteoarthritis of left knee 04/09/2015   Generalized anxiety disorder 04/09/2015   Panic attacks 04/09/2015   Spermatocele 05/08/2014   Benign localized hyperplasia of prostate with urinary obstruction 05/07/2014   Malignant neoplasm of prostate ([place]) 04/22/2014    Carey Bullocks, OTR/L 05/26/2021, 2:09 PM  [facility] [address] [place], [place], [address] Phone: [phone]   Fax:  [phone]  Name: George [name]. MRN: [id number] Date of Birth: 17-Nov-1945

## 2021-05-26 NOTE — Therapy (Signed)
[facility] [address] [place], [place], [address] Phone: [phone]   Fax:  [phone]  Physical Therapy Treatment  Patient Details  Name: George [name]. MRN: [id number] Date of Birth: April 22, 1945 Referring Provider (PT): Frann Rider, NP   Encounter Date: 05/26/2021   PT End of Session - 05/26/21 1141     Visit Number 2    Number of Visits 17    Date for PT Re-Evaluation 08/11/21    Authorization Type Medicare part A & B - will need 10th visit PN.    PT Start Time 1145    PT Stop Time 1230    PT Time Calculation (min) 45 min    Equipment Utilized During Treatment Gait belt    Activity Tolerance Patient tolerated treatment well    Behavior During Therapy WFL for tasks assessed/performed             Past Medical History:  Diagnosis Date   Anxiety    Arthritis    OSTEO IN KNEE   Asthma    as child   Colon cancer ([place])    Depression    Elevated prostate specific antigen (PSA)    Generalized anxiety disorder 04/09/2015   Hematospermia    Hx antineoplastic chemotherapy 2010   Hypertension    Idiopathic peripheral neuropathy    TOES OF BOTH FEET DUE TO CHEMO   Incomplete bladder emptying    Malignant neoplasm of prostate ([place])    Nodular prostate with lower urinary tract symptoms    Panic attacks 04/09/2015   Primary localized osteoarthrosis of the knee, right 05/19/2016   Prostatitis    S/P total knee arthroplasty, left 05/19/2016   Spermatocele    Weak urinary stream     Past Surgical History:  Procedure Laterality Date   COLON RESECTION   NOV 2009   COLONOSCOPY     PROSTATE BIOPSY     SPERMATOCELECTOMY Left 05/07/2014   Procedure: LEFT SPERMATOCELECTOMY;  Surgeon: Malka So, MD;  Location: [facility];  Service: Urology;  Laterality: Left;   TONSILLECTOMY     TOTAL KNEE ARTHROPLASTY Left 04/21/2015   Procedure: LEFT TOTAL KNEE ARTHROPLASTY;  Surgeon: Elsie Saas, MD;  Location: [facility];   Service: Orthopedics;  Laterality: Left;   TOTAL KNEE ARTHROPLASTY Right 05/31/2016   Procedure: TOTAL KNEE ARTHROPLASTY;  Surgeon: Elsie Saas, MD;  Location: [facility];  Service: Orthopedics;  Laterality: Right;   TRANSURETHRAL RESECTION OF PROSTATE N/A 05/07/2014   Procedure: TRANSURETHRAL RESECTION OF THE PROSTATE WITH GYRUS INSTRUMENTS;  Surgeon: Malka So, MD;  [facility];  Service: Urology;  Laterality: N/A;    There were no vitals filed for this visit.   Subjective Assessment - 05/26/21 1147     Subjective Pt reports some anxiety waiting for his PT appointment. Pt states he feels 65% back to his normal. Pt states his vision is improving. He saw his optometrist and he is to going to get new glasses.    Pertinent History PMH: subacute right PCA infarct (02/2021),  h/o colon cancer s/p resection, prostate cancer, anxiety/depression, osteoarthritis of bilateral knees, peripheral neuropathy.    Patient Stated Goals Wants to get back to where he was prior to CVA.                [facility] PT Assessment - 05/26/21 0001       Assessment   Medical Diagnosis Rt PCA infarct    Referring Provider (PT) Frann Rider,  NP    Onset Date/Surgical Date 02/27/21    Hand Dominance Right                           [facility] Adult PT Treatment/Exercise - 05/26/21 0001       Transfers   Sit to Stand 5: Supervision    Stand to Sit 5: Supervision    Comments Required CGA by end of session      Ambulation/Gait   Ambulation/Gait Assistance 4: Min guard    Assistive device None    Gait Pattern Step-through pattern;Ataxic;Decreased step length - left;Decreased dorsiflexion - left   toe in (pt reports this is baseline)   Ambulation Surface Level;Indoor    Gait Comments cues for heel strike to improve foot clearance and stride length. 3 minor LOB "stubbing" L toe while walking the turn on the track      Knee/Hip Exercises: Aerobic   Other Aerobic Sci fit: UEs & LEs L 2.5 x 6 min                  Balance Exercises - 05/26/21 0001       Balance Exercises: Standing   Standing Eyes Opened Narrow base of support (BOS);Solid surface   head turns and head nods x30 sec   Standing Eyes Closed Narrow base of support (BOS);2 reps;30 secs;Solid surface   head turns and head nods x30 sec each   SLS with Vectors Solid surface   3x10 forward taps on cone   Partial Tandem Stance Eyes open;2 reps;30 secs    Other Standing Exercises Next to counter: forward gait on tip toes x3 reps; heel walking x 3 reps                  PT Short Term Goals - 05/13/21 1416       PT SHORT TERM GOAL #1   Title Pt will be independent with initial HEP with wife's supervision in order to build upon functional gains made in therapy. ALL STGS DUE 06/10/21    Time 4    Period Weeks    Status New    Target Date 06/10/21      PT SHORT TERM GOAL #2   Title Pt will improve BERG score to at least a 49/56 in order to demo decr fall risk.    Baseline 45/56    Time 4    Status New      PT SHORT TERM GOAL #3   Title Pt will undergo further assessment of DGI with LTG written.    Time 4    Period Weeks    Status New      PT SHORT TERM GOAL #4   Title Pt will ambulate at least 230' over level indoor surfaces with LRAD vs. no AD with supervision in order to demo improved household mobility.    Baseline min guard over level surfaces with no AD    Time 4    Period Weeks    Status New      PT SHORT TERM GOAL #5   Title mCTSIB to be assessed with LTG written in order to demo improved balance.    Time 4    Period Weeks    Status New      Additional Short Term Goals   Additional Short Term Goals Yes      PT SHORT TERM GOAL #6   Title Pt will perform TUG in  10 seconds or less with no AD vs. LRAD with supervision in order to demo decr fall risk/improved functional mobility.    Baseline 9.28 seconds with no AD and close min guard for balance.    Time 4    Period Weeks    Status New                PT Long Term Goals - 05/13/21 1419       PT LONG TERM GOAL #1   Title Pt will be independent with final HEP with wife's supervision in order to build upon functional gains made in therapy. ALL LTGS DUE 07/08/21    Time 8    Period Weeks    Status New    Target Date 07/08/21      PT LONG TERM GOAL #2   Title DGI goal to be written as appropriate to demo decr fall risk.    Time 8    Period Weeks    Status New      PT LONG TERM GOAL #3   Title mCTSIB goal to be written in order to demo improved balance strategies.    Time 8    Period Weeks    Status New      PT LONG TERM GOAL #4   Title Pt will ambulate at least 500' over unlevel surfaces outdoors with no AD vs. LRAD with supervision in order to demo improved community mobility.    Time 8    Period Weeks    Status New      PT LONG TERM GOAL #5   Title Pt will maintain gait speed of 3.2 ft/sec with no AD vs. LRAD with mod I in order to demo improved community mobility.    Baseline 3.21 ft/sec with no AD but with min guard for balance.    Time 8    Period Weeks                   Plan - 05/26/21 1231     Clinical Impression Statement Treatment focused on initiating HEP for home. Worked primarily on balance and weight shifting this session. Pt limited due to FOF; however, does not want to use a/d. During amb with no a/d, pt with 3 instances of not obtaining enough L foot clearance and stumbling requiring min A to correct (mostly while performing turn around track). Reports feeling limited due to [facility] from his mask.    Personal Factors and Comorbidities Comorbidity 3+;Past/Current Experience;Time since onset of injury/illness/exacerbation;Behavior Pattern    Comorbidities h/o colon cancer s/p resection, prostate cancer, anxiety/depression, osteoarthritis of bilateral knees, peripheral neuropathy.    Examination-Activity Limitations Locomotion Level;Stairs;Transfers;Bend    Examination-Participation  Restrictions Community Activity;Driving;Meal Prep;Medication Management    Stability/Clinical Decision Making Evolving/Moderate complexity    Rehab Potential Good    PT Frequency 2x / week    PT Duration 12 weeks    PT Treatment/Interventions ADLs/Self Care Home Management;Aquatic Therapy;DME Instruction;Gait training;Stair training;Functional mobility training;Therapeutic activities;Neuromuscular re-education;Balance training;Therapeutic exercise;Patient/family education;Vestibular    PT Next Visit Plan Perform DGI and mCTSIB. standing balance with SLS tasks, narrow BOS, weight shifting, eyes closed/unlevel surfaces. Work on balance, try gait with cane.    PT Home Exercise Plan Access Code: [email or web address]    Consulted and Agree with Plan of Care Patient             Patient will benefit from skilled therapeutic intervention in order to improve the following deficits and impairments:  Abnormal gait,  Decreased balance, Decreased activity tolerance, Decreased coordination, Decreased knowledge of precautions, Decreased knowledge of use of DME, Decreased mobility, Decreased safety awareness, Decreased strength, Impaired sensation  Visit Diagnosis: Other lack of coordination  Ataxic gait  Unsteadiness on feet  Other abnormalities of gait and mobility  Cerebrovascular accident (CVA) due to thrombosis of precerebral artery [facility])     Problem List Patient Active Problem List   Diagnosis Date Noted   AKI (acute kidney injury) ([place])    Dyslipidemia    Essential hypertension    Hemiparesis affecting left side as late effect of stroke ([place])    Subcortical infarction ([place]) 03/05/2021   Recurrent major depressive disorder, in partial remission ([place])    CVA (cerebral vascular accident) ([place]) 02/27/2021   Atrial fibrillation with RVR ([place]) 02/27/2021   Hypokalemia 02/27/2021   Prolonged QT interval 02/27/2021   Polyneuropathy 01/31/2018   Chemotherapy-induced neuropathy ([place]) 01/31/2018   B12  deficiency 01/31/2018   Prediabetes 01/31/2018   Basal ganglia infarction ([place]) 01/31/2018   SBO (small bowel obstruction) ([place]) 10/20/2017   Primary localized osteoarthritis of right knee 05/31/2016   Primary localized osteoarthrosis of the knee, right 05/19/2016   S/P total knee arthroplasty, left 05/19/2016   Dyspnea 01/20/2016   DJD (degenerative joint disease) of knee 04/21/2015   Primary localized osteoarthritis of left knee 04/09/2015   Generalized anxiety disorder 04/09/2015   Panic attacks 04/09/2015   Spermatocele 05/08/2014   Benign localized hyperplasia of prostate with urinary obstruction 05/07/2014   Malignant neoplasm of prostate [facility]) 04/22/2014    Hanni Milford April Gordy Levan, PT, DPT 05/26/2021, 12:53 PM  [facility] [address] [place], [place], [address] Phone: [phone]   Fax:  [phone]  Name: George [name]. MRN: [id number] Date of Birth: 1945-09-08

## 2021-05-26 NOTE — Patient Instructions (Signed)
°  Coordination Activities  Perform the following activities for 10 minutes 1-2 times per day with left hand(s).  Rotate ball in fingertips (clockwise and counter-clockwise). Flip cards 1 at a time as fast as you can. Deal cards with your thumb (Hold deck in hand and push card off top with thumb). Rotate one card in hand (clockwise and counter-clockwise). Pick up coins, buttons, marbles, dried beans/pasta of different sizes and place in container. Pick up coins and stack. Screw together nuts and bolts, then unfasten.  Visual scanning strategies:   1. Look for the edge of objects (to the left and/or right) so that you make sure you are seeing all of an object 2. Turn your head when walking, scan from side to side, particularly in busy environments 3. Use an organized scanning pattern. It's usually easier to scan from top to bottom, and left to right (like you are reading) 4. Double check yourself 5. Use a line guide (like a blank piece of paper) or your finger when reading 6. If necessary, place brightly colored tape at end of table or work area as a reminder to always look until you see the tape.   Activities to try at home to encourage visual scanning:   1. Word searches 2. Mazes 3. Puzzles 4. Card games 5. Computer games and/or searches 6. Connect-the-dots  Activities for environmental (larger) scanning:  1. With supervision, scan for items in grocery store or drugstore.  Begin with a familiar store, then progress to a new store you've never been in before. Make sure you have supervision with this.  2. With supervision, tell a family member or caregiver when it is safe to cross a street after looking all directions and any side streets. However, do NOT cross street unless family member or caregiver is with you and says it is OK

## 2021-05-27 ENCOUNTER — Ambulatory Visit: Payer: Medicare Other | Admitting: Physical Therapy

## 2021-05-27 ENCOUNTER — Ambulatory Visit: Payer: Medicare Other | Admitting: Occupational Therapy

## 2021-05-27 DIAGNOSIS — R2681 Unsteadiness on feet: Secondary | ICD-10-CM | POA: Diagnosis not present

## 2021-05-27 DIAGNOSIS — R414 Neurologic neglect syndrome: Secondary | ICD-10-CM | POA: Diagnosis not present

## 2021-05-27 DIAGNOSIS — N2581 Secondary hyperparathyroidism of renal origin: Secondary | ICD-10-CM | POA: Diagnosis not present

## 2021-05-27 DIAGNOSIS — R2689 Other abnormalities of gait and mobility: Secondary | ICD-10-CM

## 2021-05-27 DIAGNOSIS — I129 Hypertensive chronic kidney disease with stage 1 through stage 4 chronic kidney disease, or unspecified chronic kidney disease: Secondary | ICD-10-CM | POA: Diagnosis not present

## 2021-05-27 DIAGNOSIS — R270 Ataxia, unspecified: Secondary | ICD-10-CM

## 2021-05-27 DIAGNOSIS — R278 Other lack of coordination: Secondary | ICD-10-CM | POA: Diagnosis not present

## 2021-05-27 DIAGNOSIS — R260 Ataxic gait: Secondary | ICD-10-CM

## 2021-05-27 DIAGNOSIS — D631 Anemia in chronic kidney disease: Secondary | ICD-10-CM | POA: Diagnosis not present

## 2021-05-27 DIAGNOSIS — N1832 Chronic kidney disease, stage 3b: Secondary | ICD-10-CM | POA: Diagnosis not present

## 2021-05-27 DIAGNOSIS — I6300 Cerebral infarction due to thrombosis of unspecified precerebral artery: Secondary | ICD-10-CM

## 2021-05-27 NOTE — Therapy (Signed)
[facility] [address] [place], [place], [address] Phone: [phone]   Fax:  [phone]  Occupational Therapy Treatment  Patient Details  Name: George [name]. MRN: [id number] Date of Birth: 06-Oct-1945 Referring Provider (OT): Frann Rider   Encounter Date: 05/27/2021   OT End of Session - 05/27/21 1453     Visit Number 3    Number of Visits 13    Date for OT Re-Evaluation 06/24/21    Authorization Type M[employer], [employer] - covered 100%    OT Start Time 1450    OT Stop Time 4287    OT Time Calculation (min) 40 min    Activity Tolerance Patient tolerated treatment well    Behavior During Therapy WFL for tasks assessed/performed             Past Medical History:  Diagnosis Date   Anxiety    Arthritis    OSTEO IN KNEE   Asthma    as child   Colon cancer ([place])    Depression    Elevated prostate specific antigen (PSA)    Generalized anxiety disorder 04/09/2015   Hematospermia    Hx antineoplastic chemotherapy 2010   Hypertension    Idiopathic peripheral neuropathy    TOES OF BOTH FEET DUE TO CHEMO   Incomplete bladder emptying    Malignant neoplasm of prostate ([place])    Nodular prostate with lower urinary tract symptoms    Panic attacks 04/09/2015   Primary localized osteoarthrosis of the knee, right 05/19/2016   Prostatitis    S/P total knee arthroplasty, left 05/19/2016   Spermatocele    Weak urinary stream     Past Surgical History:  Procedure Laterality Date   COLON RESECTION   NOV 2009   COLONOSCOPY     PROSTATE BIOPSY     SPERMATOCELECTOMY Left 05/07/2014   Procedure: LEFT SPERMATOCELECTOMY;  Surgeon: Malka So, MD;  [facility];  Service: Urology;  Laterality: Left;   TONSILLECTOMY     TOTAL KNEE ARTHROPLASTY Left 04/21/2015   Procedure: LEFT TOTAL KNEE ARTHROPLASTY;  Surgeon: Elsie Saas, MD;  Location: [facility];  Service: Orthopedics;  Laterality: Left;   TOTAL KNEE ARTHROPLASTY  Right 05/31/2016   Procedure: TOTAL KNEE ARTHROPLASTY;  Surgeon: Elsie Saas, MD;  Location: [facility];  Service: Orthopedics;  Laterality: Right;   TRANSURETHRAL RESECTION OF PROSTATE N/A 05/07/2014   Procedure: TRANSURETHRAL RESECTION OF THE PROSTATE WITH GYRUS INSTRUMENTS;  Surgeon: Malka So, MD;  [facility];  Service: Urology;  Laterality: N/A;    There were no vitals filed for this visit.   Subjective Assessment - 05/27/21 1452     Subjective  "I still have some anxiety"    Pertinent History Rt PCA infarct 02/27/21 w/ homonymous hemianopsia. PMH: h/o colon CA s/p resection, prostate CA, OA bilateral knees    Patient Stated Goals To be able to walk again like I was before    Currently in Pain? Yes    Pain Score 4     Pain Location Leg    Pain Orientation Left    Pain Descriptors / Indicators Sore    Pain Type Acute pain    Pain Onset More than a month ago    Pain Frequency Constant                          OT Treatments/Exercises (OP) - 05/27/21 0001  Cognitive Exercises   Other Cognitive Exercises 1 navigation in therapy gym - pt without cane and with close supervision for unsteadiness during ambulation to and from lobby.. Pt unable to recall how to get out of gym back to lobby at conclusion of session      Visual/Perceptual Exercises   Scanning Tabletop    Scanning - Tabletop clocks (digital/analog) with min cues for scanning strategies for left inattention, word search with across words only with increased time. Pt with improved carryover of scanning strategies    Other Exercises 12 pc puzzle with max cues for attending to details of pieces for completing                      OT Short Term Goals - 05/26/21 1309       OT SHORT TERM GOAL #1   Title Pt to be independent with coordination HEP    Time 3    Period Weeks    Status On-going      OT SHORT TERM GOAL #2   Title Pt to verbalize understanding with visual scanning  strategies and compensations for decreased sensation Lt hand    Time 3    Period Weeks    Status On-going      OT SHORT TERM GOAL #3   Title Improve coordination Lt hand as evidenced by reducing speed on 9 hole peg test to 60 sec. or less    Baseline 74.87 sec    Time 3    Period Weeks    Status New      OT SHORT TERM GOAL #4   Title Pt to perform tabletop scanning tasks at 90% accuracy or greater    Time 3    Period Weeks    Status New               OT Long Term Goals - 05/13/21 1334       OT LONG TERM GOAL #1   Title Pt to improve coordination Lt hand as evidenced by performing 9 hole peg test in 50 sec or less    Baseline 74.87 sec    Time 6    Period Weeks    Status New      OT LONG TERM GOAL #2   Title Pt to perform environmental scanning at 85% or greater accuracy in prep for potential return to driving    Time 6    Period Weeks    Status New      OT LONG TERM GOAL #3   Title Pt to perform dynamic standing tasks for IADLS w/o LOB    Time 6    Period Weeks    Status New      OT LONG TERM GOAL #4   Title Pt to cook simple meal safely attending to Lt side of body    Time 6    Period Weeks    Status New                   Plan - 05/27/21 1601     Clinical Impression Statement Pt with good use of scanning strategies however continues to req min cues.    OT Occupational Profile and History Problem Focused Assessment - Including review of records relating to presenting problem    Occupational performance deficits (Please refer to evaluation for details): IADL's    Body Structure / Function / Physical Skills Dexterity;UE functional use;GMC;IADL;Coordination;FMC;Sensation    Cognitive Skills Attention  Rehab Potential Good    Clinical Decision Making Several treatment options, min-mod task modification necessary    Comorbidities Affecting Occupational Performance: May have comorbidities impacting occupational performance    Modification or  Assistance to Complete Evaluation  No modification of tasks or assist necessary to complete eval    OT Frequency 2x / week    OT Duration 6 weeks   plus eval   OT Treatment/Interventions Therapeutic activities;Cognitive remediation/compensation;Coping strategies training;Therapeutic exercise;Visual/perceptual remediation/compensation;Functional Mobility Training;Patient/family education    Plan evironmental scanning (w gait belt), MVPT, LUE coordination    Consulted and Agree with Plan of Care Patient             Patient will benefit from skilled therapeutic intervention in order to improve the following deficits and impairments:   Body Structure / Function / Physical Skills: Dexterity, UE functional use, GMC, IADL, Coordination, FMC, Sensation Cognitive Skills: Attention     Visit Diagnosis: Other lack of coordination  Unsteadiness on feet  Other abnormalities of gait and mobility  Ataxia  Neurological neglect syndrome    Problem List Patient Active Problem List   Diagnosis Date Noted   AKI (acute kidney injury) ([place])    Dyslipidemia    Essential hypertension    Hemiparesis affecting left side as late effect of stroke ([place])    Subcortical infarction ([place]) 03/05/2021   Recurrent major depressive disorder, in partial remission ([place])    CVA (cerebral vascular accident) ([place]) 02/27/2021   Atrial fibrillation with RVR ([place]) 02/27/2021   Hypokalemia 02/27/2021   Prolonged QT interval 02/27/2021   Polyneuropathy 01/31/2018   Chemotherapy-induced neuropathy ([place]) 01/31/2018   B12 deficiency 01/31/2018   Prediabetes 01/31/2018   Basal ganglia infarction ([place]) 01/31/2018   SBO (small bowel obstruction) ([place]) 10/20/2017   Primary localized osteoarthritis of right knee 05/31/2016   Primary localized osteoarthrosis of the knee, right 05/19/2016   S/P total knee arthroplasty, left 05/19/2016   Dyspnea 01/20/2016   DJD (degenerative joint disease) of knee 04/21/2015    Primary localized osteoarthritis of left knee 04/09/2015   Generalized anxiety disorder 04/09/2015   Panic attacks 04/09/2015   Spermatocele 05/08/2014   Benign localized hyperplasia of prostate with urinary obstruction 05/07/2014   Malignant neoplasm of prostate ([place]) 04/22/2014    Zachery Conch, OT 05/27/2021, 4:02 PM  [facility] [address] [place], [place], [address] Phone: [phone]   Fax:  [phone]  Name: George [name]. MRN: [id number] Date of Birth: 09-02-1945

## 2021-05-27 NOTE — Therapy (Signed)
[facility] [address] [place], [place], [address] Phone: [phone]   Fax:  [phone]  Physical Therapy Treatment  Patient Details  Name: George [name]. MRN: [id number] Date of Birth: 04-05-1946 Referring Provider (PT): Frann Rider, NP   Encounter Date: 05/27/2021   PT End of Session - 05/27/21 1410     Visit Number 3    Number of Visits 17    Date for PT Re-Evaluation 08/11/21    Authorization Type Medicare part A & B - will need 10th visit PN.    PT Start Time 1407    PT Stop Time 1445    PT Time Calculation (min) 38 min    Equipment Utilized During Treatment Gait belt    Activity Tolerance Patient tolerated treatment well    Behavior During Therapy WFL for tasks assessed/performed             Past Medical History:  Diagnosis Date   Anxiety    Arthritis    OSTEO IN KNEE   Asthma    as child   Colon cancer ([place])    Depression    Elevated prostate specific antigen (PSA)    Generalized anxiety disorder 04/09/2015   Hematospermia    Hx antineoplastic chemotherapy 2010   Hypertension    Idiopathic peripheral neuropathy    TOES OF BOTH FEET DUE TO CHEMO   Incomplete bladder emptying    Malignant neoplasm of prostate ([place])    Nodular prostate with lower urinary tract symptoms    Panic attacks 04/09/2015   Primary localized osteoarthrosis of the knee, right 05/19/2016   Prostatitis    S/P total knee arthroplasty, left 05/19/2016   Spermatocele    Weak urinary stream     Past Surgical History:  Procedure Laterality Date   COLON RESECTION   NOV 2009   COLONOSCOPY     PROSTATE BIOPSY     SPERMATOCELECTOMY Left 05/07/2014   Procedure: LEFT SPERMATOCELECTOMY;  Surgeon: Malka So, MD;  Location: [facility];  Service: Urology;  Laterality: Left;   TONSILLECTOMY     TOTAL KNEE ARTHROPLASTY Left 04/21/2015   Procedure: LEFT TOTAL KNEE ARTHROPLASTY;  Surgeon: Elsie Saas, MD;  Location: [facility];   Service: Orthopedics;  Laterality: Left;   TOTAL KNEE ARTHROPLASTY Right 05/31/2016   Procedure: TOTAL KNEE ARTHROPLASTY;  Surgeon: Elsie Saas, MD;  Location: [facility];  Service: Orthopedics;  Laterality: Right;   TRANSURETHRAL RESECTION OF PROSTATE N/A 05/07/2014   Procedure: TRANSURETHRAL RESECTION OF THE PROSTATE WITH GYRUS INSTRUMENTS;  Surgeon: Malka So, MD;  Location[facility];  Service: Urology;  Laterality: N/A;    There were no vitals filed for this visit.   Subjective Assessment - 05/27/21 1410     Subjective Pt reports some anxiety today. Nothing else new or different. Reports continued L thigh soreness.    Pertinent History PMH: subacute right PCA infarct (02/2021),  h/o colon cancer s/p resection, prostate cancer, anxiety/depression, osteoarthritis of bilateral knees, peripheral neuropathy.    Patient Stated Goals Wants to get back to where he was prior to CVA.    Currently in Pain? Yes    Pain Score 5     Pain Location Leg    Pain Orientation Left    Pain Descriptors / Indicators Sore                [facility] PT Assessment - 05/27/21 0001       Assessment   Medical  Diagnosis Rt PCA infarct    Referring Provider (PT) Frann Rider, NP    Onset Date/Surgical Date 02/27/21    Hand Dominance Right      Standardized Balance Assessment   Standardized Balance Assessment Dynamic Gait Index      Dynamic Gait Index   Level Surface Mild Impairment    Change in Gait Speed Moderate Impairment    Gait with Horizontal Head Turns Moderate Impairment    Gait with Vertical Head Turns Mild Impairment    Gait and Pivot Turn Mild Impairment    Step Over Obstacle Mild Impairment    Step Around Obstacles Mild Impairment    Steps Mild Impairment   L LE almost missed first step though   Total Score 14    DGI comment: significant fall risk      High Level Balance   High Level Balance Comments mCTSIB: condition 1-3: 30 sec; condition 4: 20.19 sec                            [facility] Adult PT Treatment/Exercise - 05/27/21 0001       Transfers   Sit to Stand 5: Supervision    Stand to Sit 5: Supervision      Ambulation/Gait   Ambulation/Gait Assistance 4: Min guard    Ambulation Distance (Feet) --   Throughout gym during session   Assistive device None    Gait Pattern Step-through pattern;Ataxic;Decreased step length - left;Decreased dorsiflexion - left    Ambulation Surface Level;Indoor      Knee/Hip Exercises: Aerobic   Other Aerobic Sci fit: UEs & LEs L 2.5 x 5 min                 Balance Exercises - 05/27/21 0001       Balance Exercises: Standing   Standing Eyes Opened Wide (BOA);Foam/compliant surface   2x10 head nods and head turns   Standing Eyes Closed Narrow base of support (BOS);2 reps;30 secs;Solid surface   head nods and head turns x10 each   Rockerboard Anterior/posterior;Lateral    Rockerboard Limitations statically x30 sec first; R/L weightshift 2x10; A/P weightshift 2x10                  PT Short Term Goals - 05/27/21 1536       PT SHORT TERM GOAL #1   Title Pt will be independent with initial HEP with wife's supervision in order to build upon functional gains made in therapy. ALL STGS DUE 06/10/21    Time 4    Period Weeks    Status New    Target Date 06/10/21      PT SHORT TERM GOAL #2   Title Pt will improve BERG score to at least a 49/56 in order to demo decr fall risk.    Baseline 45/56    Time 4    Status New      PT SHORT TERM GOAL #3   Title Pt will undergo further assessment of DGI with LTG written.    Baseline UPDATE 2/15: SEE LTGs    Time 4    Period Weeks    Status Achieved      PT SHORT TERM GOAL #4   Title Pt will ambulate at least 230' over level indoor surfaces with LRAD vs. no AD with supervision in order to demo improved household mobility.    Baseline min guard over level surfaces with no AD  Time 4    Period Weeks    Status New      PT SHORT TERM  GOAL #5   Title mCTSIB to be assessed with LTG written in order to demo improved balance.    Baseline UPDATE 2/15: SEE LTG    Time 4    Period Weeks    Status Achieved      PT SHORT TERM GOAL #6   Title Pt will perform TUG in 10 seconds or less with no AD vs. LRAD with supervision in order to demo decr fall risk/improved functional mobility.    Baseline 9.28 seconds with no AD and close min guard for balance.    Time 4    Period Weeks    Status New               PT Long Term Goals - 05/27/21 1537       PT LONG TERM GOAL #1   Title Pt will be independent with final HEP with wife's supervision in order to build upon functional gains made in therapy. ALL LTGS DUE 07/08/21    Time 8    Period Weeks    Status On-going    Target Date 07/08/21      PT LONG TERM GOAL #2   Title Pt will have improved DGI to >19 to demo decreased fall risk    Baseline 13/24 on 05/27/21    Time 8    Period Weeks    Status Revised      PT LONG TERM GOAL #3   Title Pt will be able to demo 30 sec in all conditions of mCTSIB to demo improved balance strategies    Baseline 20 sec in condition 4 with increased R trunk sway/weight shift    Time 8    Period Weeks    Status Revised      PT LONG TERM GOAL #4   Title Pt will ambulate at least 500' over unlevel surfaces outdoors with no AD vs. LRAD with supervision in order to demo improved community mobility.    Time 8    Period Weeks    Status New      PT LONG TERM GOAL #5   Title Pt will maintain gait speed of 3.2 ft/sec with no AD vs. LRAD with mod I in order to demo improved community mobility.    Baseline 3.21 ft/sec with no AD but with min guard for balance.    Time 8    Period Weeks                   Plan - 05/27/21 1446     Clinical Impression Statement Continued to work on balance this session, with focus on improving pt's weight shifting and COB. Assessed pt's DGI and mCTSIB -- long term goals revised accordingly to include new  scores. Pts DGI places him at a high risk of falls. Discussed with pt importance of walking with a/d like a cane especially in the community. Pt verbalizes understanding -- willing to try next session.    Personal Factors and Comorbidities Comorbidity 3+;Past/Current Experience;Time since onset of injury/illness/exacerbation;Behavior Pattern    Comorbidities h/o colon cancer s/p resection, prostate cancer, anxiety/depression, osteoarthritis of bilateral knees, peripheral neuropathy.    Examination-Activity Limitations Locomotion Level;Stairs;Transfers;Bend    Examination-Participation Restrictions Community Activity;Driving;Meal Prep;Medication Management    Stability/Clinical Decision Making Evolving/Moderate complexity    Rehab Potential Good    PT Frequency 2x / week  PT Duration 12 weeks    PT Treatment/Interventions ADLs/Self Care Home Management;Aquatic Therapy;DME Instruction;Gait training;Stair training;Functional mobility training;Therapeutic activities;Neuromuscular re-education;Balance training;Therapeutic exercise;Patient/family education;Vestibular    PT Next Visit Plan Continue standing balance with SLS tasks, narrow BOS, weight shifting, eyes closed/unlevel surfaces. Work on amb with cane indoors and with obstacles.    PT Home Exercise Plan Access Code: [email or web address]    Consulted and Agree with Plan of Care Patient             Patient will benefit from skilled therapeutic intervention in order to improve the following deficits and impairments:  Abnormal gait, Decreased balance, Decreased activity tolerance, Decreased coordination, Decreased knowledge of precautions, Decreased knowledge of use of DME, Decreased mobility, Decreased safety awareness, Decreased strength, Impaired sensation  Visit Diagnosis: Unsteadiness on feet  Ataxic gait  Other abnormalities of gait and mobility  Cerebrovascular accident (CVA) due to thrombosis of precerebral artery [facility])  Neurological  neglect syndrome  Ataxia     Problem List Patient Active Problem List   Diagnosis Date Noted   AKI (acute kidney injury) ([place])    Dyslipidemia    Essential hypertension    Hemiparesis affecting left side as late effect of stroke ([place])    Subcortical infarction ([place]) 03/05/2021   Recurrent major depressive disorder, in partial remission ([place])    CVA (cerebral vascular accident) ([place]) 02/27/2021   Atrial fibrillation with RVR ([place]) 02/27/2021   Hypokalemia 02/27/2021   Prolonged QT interval 02/27/2021   Polyneuropathy 01/31/2018   Chemotherapy-induced neuropathy ([place]) 01/31/2018   B12 deficiency 01/31/2018   Prediabetes 01/31/2018   Basal ganglia infarction ([place]) 01/31/2018   SBO (small bowel obstruction) ([place]) 10/20/2017   Primary localized osteoarthritis of right knee 05/31/2016   Primary localized osteoarthrosis of the knee, right 05/19/2016   S/P total knee arthroplasty, left 05/19/2016   Dyspnea 01/20/2016   DJD (degenerative joint disease) of knee 04/21/2015   Primary localized osteoarthritis of left knee 04/09/2015   Generalized anxiety disorder 04/09/2015   Panic attacks 04/09/2015   Spermatocele 05/08/2014   Benign localized hyperplasia of prostate with urinary obstruction 05/07/2014   Malignant neoplasm of prostate [facility]) 04/22/2014    Chrystina Naff April Gordy Levan, PT, DPT 05/27/2021, 3:43 PM  [facility] [address] [place], [place], [address] Phone: [phone]   Fax:  [phone]  Name: Loyed Wilmes. MRN: [id number] Date of Birth: 1945/06/12

## 2021-05-28 ENCOUNTER — Other Ambulatory Visit: Payer: Self-pay | Admitting: Physician Assistant

## 2021-05-28 ENCOUNTER — Other Ambulatory Visit: Payer: Self-pay | Admitting: Physical Medicine and Rehabilitation

## 2021-05-29 ENCOUNTER — Telehealth: Payer: Self-pay

## 2021-05-29 ENCOUNTER — Other Ambulatory Visit: Payer: Self-pay | Admitting: *Deleted

## 2021-05-29 MED ORDER — HYDROXYZINE HCL 10 MG PO TABS: ORAL_TABLET | ORAL | 0 refills | Status: DC

## 2021-05-29 NOTE — Telephone Encounter (Signed)
Refill request for Eliquis from [facility]. Are you still refilling this medication for patient or does it need to be sent to PCP or Cardiologist?

## 2021-06-02 NOTE — Telephone Encounter (Signed)
Sent fax back to pharmacy

## 2021-06-03 ENCOUNTER — Ambulatory Visit: Payer: Medicare Other | Admitting: Occupational Therapy

## 2021-06-03 ENCOUNTER — Ambulatory Visit: Payer: Medicare Other | Admitting: Physical Therapy

## 2021-06-05 ENCOUNTER — Ambulatory Visit: Payer: Medicare Other | Admitting: Physical Therapy

## 2021-06-05 DIAGNOSIS — F411 Generalized anxiety disorder: Secondary | ICD-10-CM | POA: Diagnosis not present

## 2021-06-05 DIAGNOSIS — F330 Major depressive disorder, recurrent, mild: Secondary | ICD-10-CM | POA: Diagnosis not present

## 2021-06-08 ENCOUNTER — Encounter: Payer: Self-pay | Admitting: Physical Therapy

## 2021-06-08 ENCOUNTER — Other Ambulatory Visit: Payer: Self-pay

## 2021-06-08 ENCOUNTER — Ambulatory Visit: Payer: Medicare Other | Admitting: Physical Therapy

## 2021-06-08 ENCOUNTER — Ambulatory Visit: Payer: Medicare Other | Admitting: Occupational Therapy

## 2021-06-08 NOTE — Therapy (Signed)
[facility] [address] [place], [place], [address] Phone: [phone]   Fax:  [phone]  Patient Details  Name: George [name]. MRN: [id number] Date of Birth: 1945-12-20 Referring Provider:  No ref. provider found  Discharge Summary  Encounter Date: 06/08/2021  PHYSICAL THERAPY DISCHARGE SUMMARY  Visits from Start of Care: 3  Current functional level related to goals / functional outcomes:  PT Short Term Goals - 05/27/21 1536       PT SHORT TERM GOAL #1   Title Pt will be independent with initial HEP with wife's supervision in order to build upon functional gains made in therapy. ALL STGS DUE 06/10/21    Time 4    Period Weeks    Status New    Target Date 06/10/21      PT SHORT TERM GOAL #2   Title Pt will improve BERG score to at least a 49/56 in order to demo decr fall risk.    Baseline 45/56    Time 4    Status New      PT SHORT TERM GOAL #3   Title Pt will undergo further assessment of DGI with LTG written.    Baseline UPDATE 2/15: SEE LTGs    Time 4    Period Weeks    Status Achieved      PT SHORT TERM GOAL #4   Title Pt will ambulate at least 230' over level indoor surfaces with LRAD vs. no AD with supervision in order to demo improved household mobility.    Baseline min guard over level surfaces with no AD    Time 4    Period Weeks    Status New      PT SHORT TERM GOAL #5   Title mCTSIB to be assessed with LTG written in order to demo improved balance.    Baseline UPDATE 2/15: SEE LTG    Time 4    Period Weeks    Status Achieved      PT SHORT TERM GOAL #6   Title Pt will perform TUG in 10 seconds or less with no AD vs. LRAD with supervision in order to demo decr fall risk/improved functional mobility.    Baseline 9.28 seconds with no AD and close min guard for balance.    Time 4    Period Weeks    Status New            Unable to check any further goals due to pt not returning to  therapy.    Remaining deficits: Impaired balance, gait abnormalities, decr strength.    Education / Equipment: HEP   Patient agrees to discharge. Patient goals were not met. Patient is being discharged due to the patient's request. Pt reporting have incr anxiety attacks when thinking about coming to therapy and would not like to continue at this time. Pt wants to cancel all future appts.    Arliss Journey, PT, DPT  06/08/2021, 3:04 PM  [facility] [address] [place], [place], [address] Phone: [phone]   Fax:  [phone]

## 2021-06-10 ENCOUNTER — Ambulatory Visit: Payer: Medicare Other | Admitting: Occupational Therapy

## 2021-06-10 ENCOUNTER — Ambulatory Visit: Payer: Medicare Other | Admitting: Physical Therapy

## 2021-06-10 DIAGNOSIS — C61 Malignant neoplasm of prostate: Secondary | ICD-10-CM | POA: Diagnosis not present

## 2021-06-12 ENCOUNTER — Telehealth: Payer: Self-pay | Admitting: Physician Assistant

## 2021-06-12 NOTE — Telephone Encounter (Signed)
Patients wife came in because the insurance is not allowing them to received the 75 tablets of HYDROXYZ HCL TAB (10MG ). The insurance company is requiring the prescribe to contact them at [facility] phone number [phone] Fax [phone].  ?

## 2021-06-15 ENCOUNTER — Ambulatory Visit: Payer: Medicare Other | Admitting: Occupational Therapy

## 2021-06-15 ENCOUNTER — Ambulatory Visit: Payer: Medicare Other | Admitting: Physical Therapy

## 2021-06-15 NOTE — Telephone Encounter (Signed)
Is patient taking 3 times daily as needed? Please advise ?

## 2021-06-16 ENCOUNTER — Other Ambulatory Visit: Payer: Self-pay

## 2021-06-16 MED ORDER — HYDROXYZINE HCL 10 MG PO TABS: ORAL_TABLET | ORAL | 0 refills | Status: DC

## 2021-06-17 ENCOUNTER — Encounter: Payer: Medicare Other | Admitting: Occupational Therapy

## 2021-06-17 ENCOUNTER — Ambulatory Visit: Payer: Medicare Other | Admitting: Physical Therapy

## 2021-06-17 DIAGNOSIS — N403 Nodular prostate with lower urinary tract symptoms: Secondary | ICD-10-CM | POA: Diagnosis not present

## 2021-06-17 DIAGNOSIS — R351 Nocturia: Secondary | ICD-10-CM | POA: Diagnosis not present

## 2021-06-17 DIAGNOSIS — N5201 Erectile dysfunction due to arterial insufficiency: Secondary | ICD-10-CM | POA: Diagnosis not present

## 2021-06-17 DIAGNOSIS — Z8546 Personal history of malignant neoplasm of prostate: Secondary | ICD-10-CM | POA: Diagnosis not present

## 2021-06-17 NOTE — Telephone Encounter (Signed)
Spoke with pharmacy they are faxing over PA ?

## 2021-06-18 ENCOUNTER — Other Ambulatory Visit: Payer: Self-pay | Admitting: Physical Medicine and Rehabilitation

## 2021-06-22 ENCOUNTER — Ambulatory Visit: Payer: Medicare Other | Admitting: Physical Therapy

## 2021-06-22 ENCOUNTER — Encounter: Payer: Medicare Other | Admitting: Occupational Therapy

## 2021-06-24 DIAGNOSIS — E559 Vitamin D deficiency, unspecified: Secondary | ICD-10-CM | POA: Diagnosis not present

## 2021-06-24 DIAGNOSIS — I6340 Cerebral infarction due to embolism of unspecified cerebral artery: Secondary | ICD-10-CM | POA: Diagnosis not present

## 2021-06-24 DIAGNOSIS — I482 Chronic atrial fibrillation, unspecified: Secondary | ICD-10-CM | POA: Diagnosis not present

## 2021-06-24 DIAGNOSIS — I10 Essential (primary) hypertension: Secondary | ICD-10-CM | POA: Diagnosis not present

## 2021-06-24 DIAGNOSIS — E785 Hyperlipidemia, unspecified: Secondary | ICD-10-CM | POA: Diagnosis not present

## 2021-06-25 ENCOUNTER — Ambulatory Visit: Payer: Medicare Other | Admitting: Physical Therapy

## 2021-06-26 ENCOUNTER — Other Ambulatory Visit: Payer: Self-pay | Admitting: Physical Medicine and Rehabilitation

## 2021-06-28 ENCOUNTER — Other Ambulatory Visit: Payer: Self-pay | Admitting: Physical Medicine and Rehabilitation

## 2021-06-29 ENCOUNTER — Ambulatory Visit: Payer: Medicare Other | Admitting: Physical Therapy

## 2021-06-29 MED ORDER — APIXABAN 5 MG PO TABS: 5.0000 mg | ORAL_TABLET | Freq: Two times a day (BID) | ORAL | 0 refills | Status: DC

## 2021-07-01 ENCOUNTER — Ambulatory Visit: Payer: Medicare Other | Admitting: Physical Therapy

## 2021-07-02 ENCOUNTER — Encounter: Payer: Self-pay | Admitting: Physician Assistant

## 2021-07-02 DIAGNOSIS — F330 Major depressive disorder, recurrent, mild: Secondary | ICD-10-CM | POA: Diagnosis not present

## 2021-07-02 DIAGNOSIS — F411 Generalized anxiety disorder: Secondary | ICD-10-CM | POA: Diagnosis not present

## 2021-07-03 ENCOUNTER — Encounter: Payer: Self-pay | Admitting: Physician Assistant

## 2021-07-03 ENCOUNTER — Ambulatory Visit (INDEPENDENT_AMBULATORY_CARE_PROVIDER_SITE_OTHER): Payer: Medicare Other | Admitting: Physician Assistant

## 2021-07-03 VITALS — BP 122/70 | HR 58 | Temp 97.7°F | Resp 16 | Wt 201.8 lb

## 2021-07-03 DIAGNOSIS — U071 COVID-19: Secondary | ICD-10-CM | POA: Diagnosis not present

## 2021-07-03 DIAGNOSIS — T451X5A Adverse effect of antineoplastic and immunosuppressive drugs, initial encounter: Secondary | ICD-10-CM

## 2021-07-03 DIAGNOSIS — G620 Drug-induced polyneuropathy: Secondary | ICD-10-CM

## 2021-07-03 MED ORDER — PREGABALIN 25 MG PO CAPS: 25.0000 mg | ORAL_CAPSULE | Freq: Two times a day (BID) | ORAL | 0 refills | Status: DC

## 2021-07-03 NOTE — Patient Instructions (Addendum)
Good to see you today. ?Start on Lyrica 25 mg one capsule in the evenings. ?If tolerating well, increase to one capsule twice daily. ?Follow up with me in 4 weeks to recheck on this. ?Call sooner if any concerns.  ?

## 2021-07-03 NOTE — Progress Notes (Signed)
? ?Subjective:  ? ? Patient ID: George [name]., male    DOB: [birth year], 76 y.o.   MRN: [id number] ? ?Chief Complaint  ?Patient presents with  ? Peripheral Neuropathy  ?  Patient would like to discuss taking a medication for his neuropathy  ? ? ?HPI ?Patient is in today for discussion about neuropathic pain. ?Bottom of both of his feet, started in 2009 after chemotherapy treatment for colon cancer. Used to have it in his hands, but this is much better over the years, still some tingling in left handing. Worse at night, usually better with shoes on his feet. Wears compression stockings at night, which help some.  ? ?States he has been on gabapentin previously and this did help his neuropathic pain.  ? ?Hx of low B12, currently taking oral supplement.  ? ?Past Medical History:  ?Diagnosis Date  ? Anxiety   ? Arthritis   ? OSTEO IN KNEE  ? Asthma   ? as child  ? Colon cancer ([place])   ? Depression   ? Elevated prostate specific antigen (PSA)   ? Generalized anxiety disorder 04/09/2015  ? Hematospermia   ? Hx antineoplastic chemotherapy 2010  ? Hypertension   ? Idiopathic peripheral neuropathy   ? TOES OF BOTH FEET DUE TO CHEMO  ? Incomplete bladder emptying   ? Malignant neoplasm of prostate ([place])   ? Nodular prostate with lower urinary tract symptoms   ? Panic attacks 04/09/2015  ? Primary localized osteoarthrosis of the knee, right 05/19/2016  ? Prostatitis   ? S/P total knee arthroplasty, left 05/19/2016  ? Spermatocele   ? Weak urinary stream   ? ? ?Past Surgical History:  ?Procedure Laterality Date  ? COLON RESECTION   NOV 2009  ? COLONOSCOPY    ? PROSTATE BIOPSY    ? SPERMATOCELECTOMY Left 05/07/2014  ? Procedure: LEFT SPERMATOCELECTOMY;  Surgeon: Malka So, MD;  [facility];  Service: Urology;  Laterality: Left;  ? TONSILLECTOMY    ? TOTAL KNEE ARTHROPLASTY Left 04/21/2015  ? Procedure: LEFT TOTAL KNEE ARTHROPLASTY;  Surgeon: Elsie Saas, MD;  Location: [facility];  Service: Orthopedics;  Laterality: Left;  ?  TOTAL KNEE ARTHROPLASTY Right 05/31/2016  ? Procedure: TOTAL KNEE ARTHROPLASTY;  Surgeon: Elsie Saas, MD;  Location: [facility];  Service: Orthopedics;  Laterality: Right;  ? TRANSURETHRAL RESECTION OF PROSTATE N/A 05/07/2014  ? Procedure: TRANSURETHRAL RESECTION OF THE PROSTATE WITH GYRUS INSTRUMENTS;  Surgeon: Malka So, MD;  [facility];  Service: Urology;  Laterality: N/A;  ? ? ?Family History  ?Problem Relation Age of Onset  ? Stroke Father   ?     age 19  ? Cancer Brother   ?     neoplasm of brain  ? ? ?Social History  ? ?Tobacco Use  ? Smoking status: Former  ?  Packs/day: 0.25  ?  Years: 3.00  ?  Pack years: 0.75  ?  Types: Cigarettes  ?  Quit date: 04/12/1972  ?  Years since quitting: 49.2  ? Smokeless tobacco: Never  ? Tobacco comments:  ?  smoked 1 cigarette per day SOME DAYS  ?Vaping Use  ? Vaping Use: Never used  ?Substance Use Topics  ? Alcohol use: Yes  ?  Alcohol/week: 1.0 standard drink  ?  Types: 1 Cans of beer per week  ?  Comment: OCCASIONAL  ? Drug use: No  ?  ? ?Allergies  ?Allergen Reactions  ? Amlodipine Other (See Comments)  ?  Dizziness   ? Gabapentin   ?  hallucinations  ? ? ?Review of Systems ?NEGATIVE UNLESS OTHERWISE INDICATED IN HPI ? ? ?   ?Objective:  ?  ? ?BP 122/70   Pulse (!) 58   Temp 97.7 ?F (36.5 ?C)   Resp 16   Wt 201 lb 12.8 oz (91.5 kg)   SpO2 97%   BMI 27.37 kg/m?  ? ?Wt Readings from Last 3 Encounters:  ?07/03/21 201 lb 12.8 oz (91.5 kg)  ?04/30/21 201 lb (91.2 kg)  ?04/24/21 199 lb 3.2 oz (90.4 kg)  ? ? ?BP Readings from Last 3 Encounters:  ?07/03/21 122/70  ?05/13/21 139/78  ?04/30/21 130/84  ?  ? ?Physical Exam ?Vitals and nursing note reviewed.  ?Constitutional:   ?   General: He is not in acute distress. ?   Appearance: Normal appearance. He is not toxic-appearing.  ?HENT:  ?   Head: Normocephalic and atraumatic.  ?   Right Ear: External ear normal.  ?   Left Ear: External ear normal.  ?   Nose: Nose normal.  ?   Mouth/Throat:  ?   Mouth: Mucous membranes are  moist.  ?   Pharynx: Oropharynx is clear.  ?Eyes:  ?   Extraocular Movements: Extraocular movements intact.  ?   Conjunctiva/sclera: Conjunctivae normal.  ?   Pupils: Pupils are equal, round, and reactive to light.  ?Cardiovascular:  ?   Rate and Rhythm: Normal rate and regular rhythm.  ?   Pulses: Normal pulses.  ?   Heart sounds: Normal heart sounds.  ?Pulmonary:  ?   Effort: Pulmonary effort is normal.  ?   Breath sounds: Normal breath sounds.  ?Musculoskeletal:     ?   General: Normal range of motion.  ?   Cervical back: Normal range of motion and neck supple.  ?Skin: ?   General: Skin is warm and dry.  ?Neurological:  ?   General: No focal deficit present.  ?   Mental Status: He is alert and oriented to person, place, and time.  ?   Sensory: Sensation is intact.  ?Psychiatric:     ?   Mood and Affect: Mood is anxious.     ?   Behavior: Behavior normal.  ? ? ?   ?Assessment & Plan:  ? ?Problem List Items Addressed This Visit   ? ?  ? Nervous and Auditory  ? Chemotherapy-induced neuropathy ([place]) - Primary  ? Relevant Medications  ? busPIRone (BUSPAR) 10 MG tablet  ? pregabalin (LYRICA) 25 MG capsule  ? ? ? ?Meds ordered this encounter  ?Medications  ? pregabalin (LYRICA) 25 MG capsule  ?  Sig: Take 1 capsule (25 mg total) by mouth 2 (two) times daily.  ?  Dispense:  60 capsule  ?  Refill:  0  ? ? ?1. Chemotherapy-induced neuropathy ([place]) ?The patient has had ongoing neuropathic pain in the bottom of both of his feet since having chemotherapy in 2009.  Patient is requesting to go back on gabapentin.  When I am reviewing his previous charts, it is noted that he was having hallucinations and bad dreams on gabapentin and this it was added to his allergy list.  It looks like he was most recently on Lyrica 50 mg twice daily when he was in the hospital, but I do not see where he was discharged with this medication.  Plan to start Lyrica 25 mg once in the evenings as a starting dose to restart  this.Then increase to twice  daily if tolerating well after one week.  Possible side effects discussed with the patient.  He will let me know how he is doing.  Hopefully if his neuropathic pain can improve, and his gait will improve as well, because he thinks he has been walking on the sides of his feet and differently due to the pain. ? ?F/up 4 weeks or prn  ? ? ?This note was prepared with assistance of Systems analyst. Occasional wrong-word or sound-a-like substitutions may have occurred due to the inherent limitations of voice recognition software. ? ? ?Kayton Dunaj M Detrick Dani, PA-C ?

## 2021-07-06 ENCOUNTER — Ambulatory Visit: Payer: Medicare Other | Admitting: Physical Therapy

## 2021-07-08 ENCOUNTER — Ambulatory Visit: Payer: Medicare Other | Admitting: Physical Therapy

## 2021-07-13 ENCOUNTER — Other Ambulatory Visit: Payer: Self-pay

## 2021-07-13 ENCOUNTER — Emergency Department (HOSPITAL_COMMUNITY): Payer: Medicare Other

## 2021-07-13 ENCOUNTER — Inpatient Hospital Stay (HOSPITAL_COMMUNITY)
Admission: EM | Admit: 2021-07-13 | Discharge: 2021-07-15 | DRG: 069 | Disposition: A | Discharge status: Home / Self Care | Payer: Medicare Other | Attending: Internal Medicine | Admitting: Internal Medicine

## 2021-07-13 DIAGNOSIS — E785 Hyperlipidemia, unspecified: Secondary | ICD-10-CM | POA: Diagnosis present

## 2021-07-13 DIAGNOSIS — Z7901 Long term (current) use of anticoagulants: Secondary | ICD-10-CM

## 2021-07-13 DIAGNOSIS — Z8546 Personal history of malignant neoplasm of prostate: Secondary | ICD-10-CM | POA: Diagnosis not present

## 2021-07-13 DIAGNOSIS — Z823 Family history of stroke: Secondary | ICD-10-CM

## 2021-07-13 DIAGNOSIS — I482 Chronic atrial fibrillation, unspecified: Secondary | ICD-10-CM | POA: Diagnosis present

## 2021-07-13 DIAGNOSIS — Z809 Family history of malignant neoplasm, unspecified: Secondary | ICD-10-CM

## 2021-07-13 DIAGNOSIS — G609 Hereditary and idiopathic neuropathy, unspecified: Secondary | ICD-10-CM | POA: Diagnosis present

## 2021-07-13 DIAGNOSIS — Z87891 Personal history of nicotine dependence: Secondary | ICD-10-CM

## 2021-07-13 DIAGNOSIS — F32A Depression, unspecified: Secondary | ICD-10-CM | POA: Diagnosis present

## 2021-07-13 DIAGNOSIS — N1832 Chronic kidney disease, stage 3b: Secondary | ICD-10-CM | POA: Diagnosis not present

## 2021-07-13 DIAGNOSIS — R29818 Other symptoms and signs involving the nervous system: Secondary | ICD-10-CM | POA: Diagnosis not present

## 2021-07-13 DIAGNOSIS — R270 Ataxia, unspecified: Secondary | ICD-10-CM | POA: Diagnosis not present

## 2021-07-13 DIAGNOSIS — Z85038 Personal history of other malignant neoplasm of large intestine: Secondary | ICD-10-CM | POA: Diagnosis not present

## 2021-07-13 DIAGNOSIS — N289 Disorder of kidney and ureter, unspecified: Secondary | ICD-10-CM

## 2021-07-13 DIAGNOSIS — I480 Paroxysmal atrial fibrillation: Secondary | ICD-10-CM | POA: Diagnosis present

## 2021-07-13 DIAGNOSIS — I69398 Other sequelae of cerebral infarction: Secondary | ICD-10-CM | POA: Diagnosis not present

## 2021-07-13 DIAGNOSIS — I129 Hypertensive chronic kidney disease with stage 1 through stage 4 chronic kidney disease, or unspecified chronic kidney disease: Secondary | ICD-10-CM | POA: Diagnosis present

## 2021-07-13 DIAGNOSIS — G459 Transient cerebral ischemic attack, unspecified: Secondary | ICD-10-CM | POA: Diagnosis present

## 2021-07-13 DIAGNOSIS — Z888 Allergy status to other drugs, medicaments and biological substances status: Secondary | ICD-10-CM | POA: Diagnosis not present

## 2021-07-13 DIAGNOSIS — I69354 Hemiplegia and hemiparesis following cerebral infarction affecting left non-dominant side: Secondary | ICD-10-CM | POA: Diagnosis not present

## 2021-07-13 DIAGNOSIS — F419 Anxiety disorder, unspecified: Secondary | ICD-10-CM | POA: Diagnosis present

## 2021-07-13 DIAGNOSIS — I639 Cerebral infarction, unspecified: Secondary | ICD-10-CM | POA: Diagnosis present

## 2021-07-13 DIAGNOSIS — I4891 Unspecified atrial fibrillation: Secondary | ICD-10-CM | POA: Diagnosis not present

## 2021-07-13 DIAGNOSIS — I10 Essential (primary) hypertension: Secondary | ICD-10-CM | POA: Diagnosis present

## 2021-07-13 DIAGNOSIS — Z96653 Presence of artificial knee joint, bilateral: Secondary | ICD-10-CM | POA: Diagnosis present

## 2021-07-13 DIAGNOSIS — Z79899 Other long term (current) drug therapy: Secondary | ICD-10-CM | POA: Diagnosis not present

## 2021-07-13 DIAGNOSIS — R9431 Abnormal electrocardiogram [ECG] [EKG]: Secondary | ICD-10-CM | POA: Diagnosis not present

## 2021-07-13 DIAGNOSIS — H539 Unspecified visual disturbance: Secondary | ICD-10-CM | POA: Diagnosis present

## 2021-07-13 DIAGNOSIS — I6381 Other cerebral infarction due to occlusion or stenosis of small artery: Secondary | ICD-10-CM | POA: Diagnosis not present

## 2021-07-13 LAB — COMPREHENSIVE METABOLIC PANEL
ALT: 27 U/L (ref 0–44)
AST: 36 U/L (ref 15–41)
Albumin: 3.8 g/dL (ref 3.5–5.0)
Alkaline Phosphatase: 91 U/L (ref 38–126)
Anion gap: 7 (ref 5–15)
BUN: 20 mg/dL (ref 8–23)
CO2: 25 mmol/L (ref 22–32)
Calcium: 9.6 mg/dL (ref 8.9–10.3)
Chloride: 108 mmol/L (ref 98–111)
Creatinine, Ser: 1.73 mg/dL — ABNORMAL HIGH (ref 0.61–1.24)
GFR, Estimated: 40 mL/min — ABNORMAL LOW (ref >60)
Glucose, Bld: 111 mg/dL — ABNORMAL HIGH (ref 70–99)
Potassium: 4.2 mmol/L (ref 3.5–5.1)
Sodium: 140 mmol/L (ref 135–145)
Total Bilirubin: 0.5 mg/dL (ref 0.3–1.2)
Total Protein: 7.3 g/dL (ref 6.5–8.1)

## 2021-07-13 LAB — RAPID URINE DRUG SCREEN, HOSP PERFORMED
Amphetamines: NOT DETECTED
Barbiturates: NOT DETECTED
Benzodiazepines: NOT DETECTED
Cocaine: NOT DETECTED
Opiates: NOT DETECTED
Tetrahydrocannabinol: NOT DETECTED

## 2021-07-13 LAB — CBC
HCT: 45.6 % (ref 39.0–52.0)
Hemoglobin: 14.1 g/dL (ref 13.0–17.0)
MCH: 27.6 pg (ref 26.0–34.0)
MCHC: 30.9 g/dL (ref 30.0–36.0)
MCV: 89.4 fL (ref 80.0–100.0)
Platelets: 282 10*3/uL (ref 150–400)
RBC: 5.1 MIL/uL (ref 4.22–5.81)
RDW: 14.1 % (ref 11.5–15.5)
WBC: 7.4 10*3/uL (ref 4.0–10.5)
nRBC: 0 % (ref 0.0–0.2)

## 2021-07-13 LAB — DIFFERENTIAL
Abs Immature Granulocytes: 0.02 10*3/uL (ref 0.00–0.07)
Basophils Absolute: 0 10*3/uL (ref 0.0–0.1)
Basophils Relative: 1 %
Eosinophils Absolute: 0.5 10*3/uL (ref 0.0–0.5)
Eosinophils Relative: 6 %
Immature Granulocytes: 0 %
Lymphocytes Relative: 37 %
Lymphs Abs: 2.7 10*3/uL (ref 0.7–4.0)
Monocytes Absolute: 0.6 10*3/uL (ref 0.1–1.0)
Monocytes Relative: 8 %
Neutro Abs: 3.5 10*3/uL (ref 1.7–7.7)
Neutrophils Relative %: 48 %

## 2021-07-13 LAB — URINALYSIS, ROUTINE W REFLEX MICROSCOPIC
Bilirubin Urine: NEGATIVE
Glucose, UA: NEGATIVE mg/dL
Hgb urine dipstick: NEGATIVE
Ketones, ur: NEGATIVE mg/dL
Leukocytes,Ua: NEGATIVE
Nitrite: NEGATIVE
Protein, ur: NEGATIVE mg/dL
Specific Gravity, Urine: 1.021 (ref 1.005–1.030)
pH: 6 (ref 5.0–8.0)

## 2021-07-13 LAB — PROTIME-INR
INR: 1.1 (ref 0.8–1.2)
Prothrombin Time: 14.1 seconds (ref 11.4–15.2)

## 2021-07-13 LAB — ETHANOL: Alcohol, Ethyl (B): <10 mg/dL (ref <10)

## 2021-07-13 LAB — I-STAT CHEM 8, ED
BUN: 23 mg/dL (ref 8–23)
Calcium, Ion: 1.24 mmol/L (ref 1.15–1.40)
Chloride: 107 mmol/L (ref 98–111)
Creatinine, Ser: 1.8 mg/dL — ABNORMAL HIGH (ref 0.61–1.24)
Glucose, Bld: 110 mg/dL — ABNORMAL HIGH (ref 70–99)
HCT: 45 % (ref 39.0–52.0)
Hemoglobin: 15.3 g/dL (ref 13.0–17.0)
Potassium: 4 mmol/L (ref 3.5–5.1)
Sodium: 143 mmol/L (ref 135–145)
TCO2: 26 mmol/L (ref 22–32)

## 2021-07-13 LAB — APTT: aPTT: 30 seconds (ref 24–36)

## 2021-07-13 LAB — CBG MONITORING, ED: Glucose-Capillary: 116 mg/dL — ABNORMAL HIGH (ref 70–99)

## 2021-07-13 MED ORDER — HYDROXYZINE HCL 10 MG PO TABS
10.0000 mg | ORAL_TABLET | Freq: Four times a day (QID) | ORAL | Status: DC | PRN
  Administered 2021-07-13 – 2021-07-14 (×2): 10 mg via ORAL
  Filled 2021-07-13 (×4): qty 1

## 2021-07-13 MED ORDER — BUSPIRONE HCL 5 MG PO TABS
10.0000 mg | ORAL_TABLET | Freq: Two times a day (BID) | ORAL | Status: DC
Start: 2021-07-13 — End: 2021-07-15
  Administered 2021-07-13 – 2021-07-15 (×4): 10 mg via ORAL
  Filled 2021-07-13 (×4): qty 2

## 2021-07-13 MED ORDER — METOPROLOL SUCCINATE ER 100 MG PO TB24: 100.0000 mg | ORAL_TABLET | Freq: Every day | ORAL | Status: DC

## 2021-07-13 MED ORDER — BUPROPION HCL ER (XL) 150 MG PO TB24
450.0000 mg | ORAL_TABLET | Freq: Every morning | ORAL | Status: DC
  Administered 2021-07-14 – 2021-07-15 (×2): 450 mg via ORAL
  Filled 2021-07-13 (×2): qty 3

## 2021-07-13 MED ORDER — ESCITALOPRAM OXALATE 10 MG PO TABS: 10.0000 mg | ORAL_TABLET | Freq: Every day | ORAL | Status: DC

## 2021-07-13 MED ORDER — ACETAMINOPHEN 325 MG PO TABS
650.0000 mg | ORAL_TABLET | Freq: Four times a day (QID) | ORAL | Status: DC | PRN
Start: 2021-07-13 — End: 2021-07-15
  Administered 2021-07-15: 650 mg via ORAL
  Filled 2021-07-13: qty 2

## 2021-07-13 MED ORDER — ASPIRIN 325 MG PO TABS: 325.0000 mg | ORAL_TABLET | Freq: Once | ORAL | Status: DC

## 2021-07-13 MED ORDER — VITAMIN B-12 1000 MCG PO TABS
1000.0000 ug | ORAL_TABLET | Freq: Every day | ORAL | Status: DC
  Administered 2021-07-14 – 2021-07-15 (×2): 1000 ug via ORAL
  Filled 2021-07-13 (×2): qty 1

## 2021-07-13 MED ORDER — ASPIRIN 325 MG PO TABS
325.0000 mg | ORAL_TABLET | Freq: Once | ORAL | Status: AC
  Administered 2021-07-13: 325 mg via ORAL
  Filled 2021-07-13: qty 1

## 2021-07-13 MED ORDER — METOPROLOL TARTRATE 5 MG/5ML IV SOLN
5.0000 mg | INTRAVENOUS | Status: DC | PRN
  Administered 2021-07-13 – 2021-07-14 (×2): 5 mg via INTRAVENOUS
  Filled 2021-07-13 (×2): qty 5

## 2021-07-13 MED ORDER — STROKE: EARLY STAGES OF RECOVERY BOOK
Freq: Once | Status: AC
  Filled 2021-07-13 (×2): qty 1

## 2021-07-13 MED ORDER — IOHEXOL 350 MG/ML SOLN
100.0000 mL | Freq: Once | INTRAVENOUS | Status: AC | PRN
  Administered 2021-07-13: 100 mL via INTRAVENOUS

## 2021-07-13 MED ORDER — APIXABAN 5 MG PO TABS
5.0000 mg | ORAL_TABLET | Freq: Two times a day (BID) | ORAL | Status: DC
  Administered 2021-07-13 – 2021-07-15 (×5): 5 mg via ORAL
  Filled 2021-07-13 (×5): qty 1

## 2021-07-13 MED ORDER — SODIUM CHLORIDE 0.9 % IV SOLN: INTRAVENOUS | Status: DC

## 2021-07-13 MED ORDER — METOPROLOL SUCCINATE ER 50 MG PO TB24
50.0000 mg | ORAL_TABLET | Freq: Every day | ORAL | Status: DC
  Administered 2021-07-13 – 2021-07-14 (×2): 50 mg via ORAL
  Filled 2021-07-13: qty 2
  Filled 2021-07-13: qty 1

## 2021-07-13 MED ORDER — ATORVASTATIN CALCIUM 10 MG PO TABS
20.0000 mg | ORAL_TABLET | Freq: Every day | ORAL | Status: DC
  Administered 2021-07-13 – 2021-07-15 (×3): 20 mg via ORAL
  Filled 2021-07-13 (×3): qty 2

## 2021-07-13 MED ORDER — METOPROLOL TARTRATE 5 MG/5ML IV SOLN
2.5000 mg | Freq: Once | INTRAVENOUS | Status: AC
  Administered 2021-07-13: 2.5 mg via INTRAVENOUS
  Filled 2021-07-13: qty 5

## 2021-07-13 MED ORDER — PREGABALIN 25 MG PO CAPS
25.0000 mg | ORAL_CAPSULE | Freq: Two times a day (BID) | ORAL | Status: DC
  Administered 2021-07-13 – 2021-07-15 (×5): 25 mg via ORAL
  Filled 2021-07-13 (×5): qty 1

## 2021-07-13 MED ORDER — METOPROLOL TARTRATE 5 MG/5ML IV SOLN
2.5000 mg | INTRAVENOUS | Status: DC | PRN
  Filled 2021-07-13: qty 5

## 2021-07-13 MED ORDER — METOPROLOL TARTRATE 5 MG/5ML IV SOLN: 5.0000 mg | INTRAVENOUS | Status: DC | PRN

## 2021-07-13 MED ORDER — ACETAMINOPHEN 650 MG RE SUPP: 650.0000 mg | Freq: Four times a day (QID) | RECTAL | Status: DC | PRN

## 2021-07-13 NOTE — Assessment & Plan Note (Addendum)
-  Repeat fasting lipid panel with LDL of 19.   ?-Continue home regimen Lipitor.  ?

## 2021-07-13 NOTE — H&P (Signed)
?History and Physical  ? ? ?George Oto. MBE:[id number] DOB: 09-20-1945 DOA: 07/13/2021 ? ?PCP: Allwardt, Randa Evens, PA-C ? ?Patient coming from: Home ? ?Chief Complaint: Left-sided ataxia ? ?HPI: George Coone. is a 76 y.o. male with medical history significant of hypertension, hyperlipidemia, A-fib on Eliquis, history of prior right PCA stroke in November 2022 with some residual vision deficit and mild left-sided weakness, history of colon cancer status post resection, prostate cancer, anxiety, depression, CKD stage IIIb presented to the ED with left-sided ataxia, LKW 2330 last night.  Code stroke activated.  Not a tPA candidate due to being chronically anticoagulated.  CT head negative for acute infarct or intracranial hemorrhage.  CTA head and neck negative for LVO.  Patient unable to get an MRI done because of chronically retained metallic foreign body in his right orbit seen on CT.  Neurology concerned about possible left cerebellum stroke and recommended admission for stroke work-up.  Also patient found to be in A-fib with RVR on arrival to the ED with rate up to 160s.  George [name] was given IV metoprolol 2.5 mg.  No acute lab abnormalities. ? ?Patient states George [name] went to bed around 11:30 PM last night and was feeling fine at that time.  George [name] then woke up afterwards to use the bathroom when George [name] noticed that George [name] was having difficulty getting out of bed and fell backwards several times.  After George [name] was finally able to get up, when George [name] tried to walk George [name] was constantly bumping into objects and could not maintain his balance.  States "my equilibrium was off." Reports history of prior stroke and became concerned about his symptoms and decided to seek immediate medical attention.  George [name] is compliant with his home medications including Eliquis and metoprolol.  No other complaints.  Denies fevers, cough, shortness of breath, chest pain, nausea, vomiting, abdominal pain, diarrhea, or dysuria. ? ?Review of Systems:  ?Review of Systems   ?All other systems reviewed and are negative. ? ?Past Medical History:  ?Diagnosis Date  ? Anxiety   ? Arthritis   ? OSTEO IN KNEE  ? Asthma   ? as child  ? Colon cancer ([place])   ? Depression   ? Elevated prostate specific antigen (PSA)   ? Generalized anxiety disorder 04/09/2015  ? Hematospermia   ? Hx antineoplastic chemotherapy 2010  ? Hypertension   ? Idiopathic peripheral neuropathy   ? TOES OF BOTH FEET DUE TO CHEMO  ? Incomplete bladder emptying   ? Malignant neoplasm of prostate ([place])   ? Nodular prostate with lower urinary tract symptoms   ? Panic attacks 04/09/2015  ? Primary localized osteoarthrosis of the knee, right 05/19/2016  ? Prostatitis   ? S/P total knee arthroplasty, left 05/19/2016  ? Spermatocele   ? Weak urinary stream   ? ? ?Past Surgical History:  ?Procedure Laterality Date  ? COLON RESECTION   NOV 2009  ? COLONOSCOPY    ? PROSTATE BIOPSY    ? SPERMATOCELECTOMY Left 05/07/2014  ? Procedure: LEFT SPERMATOCELECTOMY;  Surgeon: Malka So, MD;  Location: [facility];  Service: Urology;  Laterality: Left;  ? TONSILLECTOMY    ? TOTAL KNEE ARTHROPLASTY Left 04/21/2015  ? Procedure: LEFT TOTAL KNEE ARTHROPLASTY;  Surgeon: Elsie Saas, MD;  Location: [place];  Service: Orthopedics;  Laterality: Left;  ? TOTAL KNEE ARTHROPLASTY Right 05/31/2016  ? Procedure: TOTAL KNEE ARTHROPLASTY;  Surgeon: Elsie Saas, MD;  Location: [place];  Service: Orthopedics;  Laterality: Right;  ?  TRANSURETHRAL RESECTION OF PROSTATE N/A 05/07/2014  ? Procedure: TRANSURETHRAL RESECTION OF THE PROSTATE WITH GYRUS INSTRUMENTS;  Surgeon: Malka So, MD;  Location: [facility];  Service: Urology;  Laterality: N/A;  ? ? ? reports that George [name] quit smoking about 49 years ago. His smoking use included cigarettes. George [name] has a 0.75 pack-year smoking history. George [name] has never used smokeless tobacco. George [name] reports current alcohol use of about 1.0 standard drink per week. George [name] reports that George [name] does not use drugs. ? ?Allergies  ?Allergen Reactions  ? Amlodipine Other  (See Comments)  ?  Dizziness   ? Gabapentin   ?  hallucinations  ? ? ?Family History  ?Problem Relation Age of Onset  ? Stroke Father   ?     age 10  ? Cancer Brother   ?     neoplasm of brain  ? ? ?Prior to Admission medications   ?Medication Sig Start Date End Date Taking? Authorizing Provider  ?acetaminophen (TYLENOL) 500 MG tablet Take 500 mg by mouth every 6 (six) hours as needed for mild pain or headache.   Yes [provider]  ?apixaban (ELIQUIS) 5 MG TABS tablet Take 1 tablet (5 mg total) by mouth 2 (two) times daily. 06/29/21  Yes Raulkar, Clide Deutscher, MD  ?atorvastatin (LIPITOR) 20 MG tablet Take 20 mg by mouth daily. 06/01/21  Yes [provider]  ?buPROPion (WELLBUTRIN XL) 150 MG 24 hr tablet Take 450 mg by mouth every morning.    Yes [provider]  ?busPIRone (BUSPAR) 10 MG tablet Take 10 mg by mouth 2 (two) times daily. 07/02/21  Yes [provider]  ?escitalopram (LEXAPRO) 5 MG tablet Take 1 tablet (5 mg total) by mouth daily. ?Patient taking differently: Take 10 mg by mouth daily. 04/17/21  Yes Raulkar, Clide Deutscher, MD  ?felodipine (PLENDIL) 10 MG 24 hr tablet Take 1 tablet (10 mg total) by mouth daily. ?Patient taking differently: Take 5 mg by mouth daily. 03/05/21  Yes Mercy Riding, MD  ?hydrOXYzine (ATARAX) 10 MG tablet TAKE 1 TABLET BY MOUTH EVERY 6 HOURS AS NEEDED FOR ANXIETY OR  ITCHING ?Patient taking differently: Take 10 mg by mouth every 6 (six) hours as needed for anxiety or itching. 06/16/21  Yes Allwardt, Alyssa M, PA-C  ?losartan (COZAAR) 100 MG tablet Take 100 mg by mouth daily.   Yes [provider]  ?metoprolol succinate (TOPROL XL) 50 MG 24 hr tablet Take 2.5 tablets (125 mg total) by mouth daily. Take with or immediately following a meal. ?Patient taking differently: Take 100 mg by mouth daily. Take with or immediately following a meal. 03/13/21  Yes Love, Ivan Anchors, PA-C  ?Multiple Vitamin (MULTIVITAMIN WITH MINERALS) TABS tablet Take 1 tablet by  mouth daily. 03/14/21  Yes Love, Ivan Anchors, PA-C  ?pregabalin (LYRICA) 25 MG capsule Take 1 capsule (25 mg total) by mouth 2 (two) times daily. 07/03/21 08/02/21 Yes Allwardt, Randa Evens, PA-C  ?Saline (OCEAN NASAL SPRAY NA) Place 1 spray into both nostrils as needed (congestion).   Yes [provider]  ?spironolactone (ALDACTONE) 25 MG tablet Take 1 tablet by mouth once daily ?Patient taking differently: Take 25 mg by mouth daily. 06/29/21  Yes Raulkar, Clide Deutscher, MD  ?vitamin B-12 1000 MCG tablet Take 1 tablet (1,000 mcg total) by mouth daily. 03/06/21  Yes Mercy Riding, MD  ?Vitamin D, Ergocalciferol, (DRISDOL) 1.25 MG (50000 UNIT) CAPS capsule TAKE 1 CAPSULE BY MOUTH ONCE A WEEK ON FRIDAYS ?Patient taking  differently: Take 50,000 Units by mouth every Friday. 06/18/21  Yes Raulkar, Clide Deutscher, MD  ?LORazepam (ATIVAN) 0.5 MG tablet Take 1 tablet (0.5 mg total) by mouth every 8 (eight) hours. ?Patient not taking: Reported on 07/03/2021 04/10/21   Izora Ribas, MD  ? ? ?Physical Exam: ?Vitals:  ? 07/13/21 0530 07/13/21 0600 07/13/21 0630 07/13/21 0700  ?BP: (!) 113/102 (!) 122/109 (!) 131/102 (!) 136/105  ?Pulse: 76 (!) 136 90 90  ?Resp: '13 12 15 17  '$ ?Temp:      ?SpO2: 100% 100% 99% 99%  ? ? ?Physical Exam ?Vitals reviewed.  ?Constitutional:   ?   General: George [name] is not in acute distress. ?HENT:  ?   Head: Normocephalic and atraumatic.  ?Eyes:  ?   Extraocular Movements: Extraocular movements intact.  ?   Conjunctiva/sclera: Conjunctivae normal.  ?Cardiovascular:  ?   Rate and Rhythm: Tachycardia present. Rhythm irregular.  ?   Pulses: Normal pulses.  ?Pulmonary:  ?   Effort: Pulmonary effort is normal. No respiratory distress.  ?   Breath sounds: Normal breath sounds. No wheezing or rales.  ?Abdominal:  ?   General: Bowel sounds are normal. There is no distension.  ?   Palpations: Abdomen is soft.  ?   Tenderness: There is no abdominal tenderness.  ?Musculoskeletal:     ?   General: No swelling or tenderness.  ?    Cervical back: Normal range of motion and neck supple.  ?Skin: ?   General: Skin is warm and dry.  ?Neurological:  ?   Mental Status: George [name] is alert and oriented to person, place, and time.  ?   Cranial Nobie Putnam

## 2021-07-13 NOTE — Assessment & Plan Note (Addendum)
-  Hold antihypertensives at this time to allow permissive hypertension except give low-dose IV metoprolol as needed for heart rate consistently above 120. ?-Patient placed back on Toprol-XL and dose uptitrated to 125 mg daily due to A-fib with RVR. ?

## 2021-07-13 NOTE — ED Triage Notes (Incomplete)
Pt reported to ED with c/o stroke like symptoms upon awakening this AM. LKW 2330 last night. Pt has rapid and clear speech, however complains of " jerk ?

## 2021-07-13 NOTE — ED Notes (Signed)
Pt walked to bathroom, gait was mostly steady w/occasional need for wife's balance support ?

## 2021-07-13 NOTE — Consult Note (Signed)
NEUROLOGY CONSULTATION NOTE  ? ?Date of service: July 13, 2021 ?Patient Name: George [name]. ?MRN:  [id number] ?DOB:  May 16, 1945 ?Reason for consult: "Stroke code for poor coordination on the left" ?Requesting Provider: No att. providers found ?_ _ _   _ __   _ __ _ _  __ __   _ __   __ _ ? ?History of Present Illness  ?George [name]. is a 76 y.o. male with PMH significant for afibb on eliquis, hx of prior R PCA stroke with some residual vision deficit and mild L sided weakness, h/o colon cancer s/p resection, prostate cancer, anxiety/depression, osteoarthritis of bilateral knees who went to bed at 2300 on 07/13/21 and woke up very off balance and unable to control his L arm and L leg. ? ?He is compliant with his Eliquis and took his dose at 8pm last night. ? ? ?LKW: 2300 on 07/12/21. ?mRS: 0 ?tNKASE: not offered, patient is on Eliquis and took his dose within 48 hours. ?Thrombectomy: not offered 2/2 no LVO ?NIHSS components Score: Comment  ?1a Level of Conscious 0'[x]'$  1'[]'$  2'[]'$  3'[]'$      ?1b LOC Questions 0'[x]'$  1'[]'$  2'[]'$       ?1c LOC Commands 0'[x]'$  1'[]'$  2'[]'$       ?2 Best Gaze 0'[x]'$  1'[]'$  2'[]'$       ?3 Visual 0'[]'$  1'[x]'$  2'[]'$  3'[]'$      ?4 Facial Palsy 0'[x]'$  1'[]'$  2'[]'$  3'[]'$      ?5a Motor Arm - left 0'[x]'$  1'[]'$  2'[]'$  3'[]'$  4'[]'$  UN'[]'$    ?5b Motor Arm - Right 0'[x]'$  1'[]'$  2'[]'$  3'[]'$  4'[]'$  UN'[]'$    ?6a Motor Leg - Left 0'[x]'$  1'[]'$  2'[]'$  3'[]'$  4'[]'$  UN'[]'$    ?6b Motor Leg - Right 0'[x]'$  1'[]'$  2'[]'$  3'[]'$  4'[]'$  UN'[]'$    ?7 Limb Ataxia 0'[]'$  1'[]'$  2'[x]'$  3'[]'$  UN'[]'$     ?8 Sensory 0'[x]'$  1'[]'$  2'[]'$  UN'[]'$      ?9 Best Language 0'[x]'$  1'[]'$  2'[]'$  3'[]'$      ?10 Dysarthria 0'[x]'$  1'[]'$  2'[]'$  UN'[]'$      ?11 Extinct. and Inattention 0'[x]'$  1'[]'$  2'[]'$       ?TOTAL: 3   ?  ?ROS  ? ?Constitutional Denies weight loss, fever and chills.   ?HEENT Denies changes in vision and hearing.   ?Respiratory Denies SOB and cough.   ?CV Denies palpitations and CP   ?GI Denies abdominal pain, nausea, vomiting and diarrhea.   ?GU Denies dysuria and urinary frequency.   ?MSK Denies myalgia and joint pain.   ?Skin Denies rash and pruritus.   ?Neurological  Denies headache and syncope.   ?Psychiatric Denies recent changes in mood. Denies anxiety and depression.   ? ?Past History  ? ?Past Medical History:  ?Diagnosis Date  ? Anxiety   ? Arthritis   ? OSTEO IN KNEE  ? Asthma   ? as child  ? Colon cancer ([place])   ? Depression   ? Elevated prostate specific antigen (PSA)   ? Generalized anxiety disorder 04/09/2015  ? Hematospermia   ? Hx antineoplastic chemotherapy 2010  ? Hypertension   ? Idiopathic peripheral neuropathy   ? TOES OF BOTH FEET DUE TO CHEMO  ? Incomplete bladder emptying   ? Malignant neoplasm of prostate ([place])   ? Nodular prostate with lower urinary tract symptoms   ? Panic attacks 04/09/2015  ? Primary localized osteoarthrosis of the knee, right 05/19/2016  ? Prostatitis   ? S/P total knee arthroplasty, left 05/19/2016  ? Spermatocele   ? Weak urinary stream   ? ?Past Surgical  History:  ?Procedure Laterality Date  ? COLON RESECTION   NOV 2009  ? COLONOSCOPY    ? PROSTATE BIOPSY    ? SPERMATOCELECTOMY Left 05/07/2014  ? Procedure: LEFT SPERMATOCELECTOMY;  Surgeon: Malka So, MD;  Location: [facility];  Service: Urology;  Laterality: Left;  ? TONSILLECTOMY    ? TOTAL KNEE ARTHROPLASTY Left 04/21/2015  ? Procedure: LEFT TOTAL KNEE ARTHROPLASTY;  Surgeon: Elsie Saas, MD;  Location: [place];  Service: Orthopedics;  Laterality: Left;  ? TOTAL KNEE ARTHROPLASTY Right 05/31/2016  ? Procedure: TOTAL KNEE ARTHROPLASTY;  Surgeon: Elsie Saas, MD;  Location: [place];  Service: Orthopedics;  Laterality: Right;  ? TRANSURETHRAL RESECTION OF PROSTATE N/A 05/07/2014  ? Procedure: TRANSURETHRAL RESECTION OF THE PROSTATE WITH GYRUS INSTRUMENTS;  Surgeon: Malka So, MD;  [facility];  Service: Urology;  Laterality: N/A;  ? ?Family History  ?Problem Relation Age of Onset  ? Stroke Father   ?     age 25  ? Cancer Brother   ?     neoplasm of brain  ? ?Social History  ? ?Socioeconomic History  ? Marital status: Married  ?  Spouse name: Not on file  ? Number of children: 9  ?  Years of education: Not on file  ? Highest education level: Not on file  ?Occupational History  ? Occupation: retire  ?Tobacco Use  ? Smoking status: Former  ?  Packs/day: 0.25  ?  Years: 3.00  ?  Pack years: 0.75  ?  Types: Cigarettes  ?  Quit date: 04/12/1972  ?  Years since quitting: 49.2  ? Smokeless tobacco: Never  ? Tobacco comments:  ?  smoked 1 cigarette per day SOME DAYS  ?Vaping Use  ? Vaping Use: Never used  ?Substance and Sexual Activity  ? Alcohol use: Yes  ?  Alcohol/week: 1.0 standard drink  ?  Types: 1 Cans of beer per week  ?  Comment: OCCASIONAL  ? Drug use: No  ? Sexual activity: Yes  ?Other Topics Concern  ? Not on file  ?Social History Narrative  ? Shawnie Dapper will take care of him after his surgery    Her cell is [phone]  ? ?Social Determinants of Health  ? ?Financial Resource Strain: Not on file  ?Food Insecurity: Not on file  ?Transportation Needs: Not on file  ?Physical Activity: Not on file  ?Stress: Not on file  ?Social Connections: Not on file  ? ?Allergies  ?Allergen Reactions  ? Amlodipine Other (See Comments)  ?  Dizziness   ? Gabapentin   ?  hallucinations  ? ? ?Medications  ?(Not in a hospital admission) ?  ? ?Vitals  ? ?Vitals:  ? 07/13/21 0356  ?BP: (!) 154/109  ?Pulse: (!) 133  ?Resp: 16  ?Temp: 98.9 ?F (37.2 ?C)  ?SpO2: 97%  ?  ? ?There is no height or weight on file to calculate BMI. ? ?Physical Exam  ? ?General: Laying comfortably in bed; in no acute distress.  ?HENT: Normal oropharynx and mucosa. Normal external appearance of ears and nose.  ?Neck: Supple, no pain or tenderness  ?CV: No JVD. No peripheral edema.  ?Pulmonary: Symmetric Chest rise. Normal respiratory effort.  ?Abdomen: Soft to touch, non-tender.  ?Ext: No cyanosis, edema, or deformity  ?Skin: No rash. Normal palpation of skin.   ?Musculoskeletal: Normal digits and nails by inspection. No clubbing.  ? ?Neurologic Examination  ?Mental status/Cognition: Alert, oriented to self, place, month and year, good  attention.  ?Speech/language: Fluent, comprehension intact, object naming intact, repetition intact.  ?Cranial nerves:  ? CN II Pupils equal and reactive to light, L superior quadrantanopsia  ? CN III,IV,VI EOM intact, no gaze preference or deviation, no nystagmus   ? CN V normal sensation in V1, V2, and V3 segments bilaterally   ? CN VII no asymmetry, no nasolabial fold flattening   ? CN VIII normal hearing to speech   ? CN IX & X normal palatal elevation, no uvular deviation   ? CN XI 5/5 head turn and 5/5 shoulder shrug bilaterally   ? CN XII midline tongue protrusion   ? ?Motor:  ?Muscle bulk: normal, tone normal. ?Mvmt Root Nerve  Muscle Right Left Comments  ?SA C5/6 Ax Deltoid 5 5   ?EF C5/6 Mc Biceps 5 5   ?EE C6/7/8 Rad Triceps 5 5   ?WF C6/7 Med FCR     ?WE C7/8 PIN ECU     ?F Ab C8/T1 U ADM/FDI 5 5   ?HF L1/2/3 Fem Illopsoas 4+ 4+   ?KE L2/3/4 Fem Quad 5 5   ?DF L4/5 D Peron Tib Ant 5 5   ?PF S1/2 Tibial Grc/Sol 5 5   ? ?Reflexes: ? Right Left Comments  ?Pectoralis     ? Biceps (C5/6)     ?Brachioradialis (C5/6)     ? Triceps (C6/7)     ? Patellar (L3/4)     ? Achilles (S1)     ? Hoffman     ? Plantar     ?Jaw jerk   ? ?Sensation: ? Light touch Intact throughout  ? Pin prick   ? Temperature   ? Vibration   ?Proprioception   ? ?Coordination/Complex Motor:  ?- Finger to Nose with significant ataxia in LUE ?- Heel to shin with ataxia in LLE ?- Rapid alternating movement are slowed in LUE ?- Gait: Deferred. ? ?Labs  ? ?CBC:  ?Recent Labs  ?Lab 07/13/21 ?7062 07/13/21 ?0426  ?WBC 7.4  --   ?NEUTROABS 3.5  --   ?HGB 14.1 15.3  ?HCT 45.6 45.0  ?MCV 89.4  --   ?PLT 282  --   ? ? ?Basic Metabolic Panel:  ?Lab Results  ?Component Value Date  ? NA 143 07/13/2021  ? K 4.0 07/13/2021  ? CO2 21 (L) 04/02/2021  ? GLUCOSE 110 (H) 07/13/2021  ? BUN 23 07/13/2021  ? CREATININE 1.80 (H) 07/13/2021  ? CALCIUM 9.5 04/02/2021  ? GFRNONAA 42 (L) 04/02/2021  ? GFRAA 46 (L) 10/21/2017  ? ?Lipid Panel:  ?Lab Results  ?Component  Value Date  ? [place] 58 02/28/2021  ? ?HgbA1c:  ?Lab Results  ?Component Value Date  ? HGBA1C 5.4 02/28/2021  ? ?Urine Drug Screen:  ?   ?Component Value Date/Time  ? [place] DETECTED 02/27/2021 1126  ? [place]

## 2021-07-13 NOTE — Evaluation (Signed)
Physical Therapy Evaluation ?Patient Details ?Name: George [name]. ?MRN: [id number] ?DOB: 06-21-1945 ?Today's Date: 07/13/2021 ? ?History of Present Illness ? Pt is a 76 y/o male admitted secondary to L sided ataxia. PMH includes a fib, HTN, CVA with residual visual and L weakness, colon cancer, prostate cancer, and L TKA.  ?Clinical Impression ? Pt admitted secondary to problem above with deficits below. Notable ataxia in LLE when taking steps at EOB. Pt HR elevating to mid 150s during mobility tasks which limited further mobility. Notified RN and NP. Feel pt would benefit from PT follow up, however, reports hx of panic attacks when going to outpatient PT. Recommending HHPT at this time given hx. Will continue to follow acutely.    ?   ? ?Recommendations for follow up therapy are one component of a multi-disciplinary discharge planning process, led by the attending physician.  Recommendations may be updated based on patient status, additional functional criteria and insurance authorization. ? ?Follow Up Recommendations Home health PT ? ?  ?Assistance Recommended at Discharge Frequent or constant Supervision/Assistance  ?Patient can return home with the following ? A little help with walking and/or transfers;A little help with bathing/dressing/bathroom;Help with stairs or ramp for entrance;Assist for transportation;Assistance with cooking/housework ? ?  ?Equipment Recommendations None recommended by PT  ?Recommendations for Other Services ?    ?  ?Functional Status Assessment Patient has had a recent decline in their functional status and demonstrates the ability to make significant improvements in function in a reasonable and predictable amount of time.  ? ?  ?Precautions / Restrictions Precautions ?Precautions: Fall ?Precaution Comments: watch HR ?Restrictions ?Weight Bearing Restrictions: No  ? ?  ? ?Mobility ? Bed Mobility ?Overal bed mobility: Needs Assistance ?Bed Mobility: Supine to Sit, Sit to Supine ?  ?   ?Supine to sit: Supervision ?Sit to supine: Supervision ?  ?General bed mobility comments: Supervision for safety. ?  ? ?Transfers ?Overall transfer level: Needs assistance ?Equipment used: None ?Transfers: Sit to/from Stand ?Sit to Stand: Min guard ?  ?  ?  ?  ?  ?General transfer comment: Min guard for safety. ?  ? ?Ambulation/Gait ?Ambulation/Gait assistance: Min guard ?Gait Distance (Feet): 2 Feet ?Assistive device: None ?Gait Pattern/deviations: Step-through pattern, Decreased stride length, Ataxic ?Gait velocity: Decreased ?  ?  ?General Gait Details: Took steps at EOB. Noted ataxia when taking steps with LLE. No overt LOB and min guard for safety. HR elevating to mid 150s during mobility, so further mobility deferred. ? ?Stairs ?  ?  ?  ?  ?  ? ?Wheelchair Mobility ?  ? ?Modified Rankin (Stroke Patients Only) ?  ? ?  ? ?Balance Overall balance assessment: Needs assistance ?Sitting-balance support: No upper extremity supported, Feet supported ?Sitting balance-Leahy Scale: Fair ?  ?  ?Standing balance support: No upper extremity supported ?Standing balance-Leahy Scale: Fair ?  ?  ?  ?  ?  ?  ?  ?  ?  ?  ?  ?  ?   ? ? ? ?Pertinent Vitals/Pain    ? ? ?Home Living Family/patient expects to be discharged to:: Private residence ?Living Arrangements: Spouse/significant other ?Available Help at Discharge: Family;Available PRN/intermittently ?Type of Home: House ?Home Access: Level entry ?  ?  ?  ?Home Layout: One level ?Home Equipment: Conservation officer, nature (2 wheels) ?   ?  ?Prior Function Prior Level of Function : Independent/Modified Independent;Driving ?  ?  ?  ?  ?  ?  ?  ?  ?  ? ? ?  Hand Dominance  ?   ? ?  ?Extremity/Trunk Assessment  ? Upper Extremity Assessment ?Upper Extremity Assessment: Defer to OT evaluation (L weakness at baseline) ?  ? ?Lower Extremity Assessment ?Lower Extremity Assessment: LLE deficits/detail ?LLE Deficits / Details: L weakness at baseline. Ataxia noted and decreased coordination with heel  to shin ?LLE Coordination: decreased gross motor ?  ? ?Cervical / Trunk Assessment ?Cervical / Trunk Assessment: Normal  ?Communication  ? Communication: No difficulties  ?Cognition Arousal/Alertness: Awake/alert ?Behavior During Therapy: [facility] for tasks assessed/performed ?Overall Cognitive Status: History of cognitive impairments - at baseline ?  ?  ?  ?  ?  ?  ?  ?  ?  ?  ?  ?  ?  ?  ?  ?  ?General Comments: Decreased awareness of deficits and safety. ?  ?  ? ?  ?General Comments General comments (skin integrity, edema, etc.): Pt's HR elevating to mid 150s with mobility. Returned to mid 120s to low 130s upon return to supine ? ?  ?Exercises    ? ?Assessment/Plan  ?  ?PT Assessment Patient needs continued PT services  ?PT Problem List Decreased strength;Decreased balance;Decreased activity tolerance;Decreased mobility;Decreased coordination;Decreased knowledge of use of DME;Decreased safety awareness;Decreased knowledge of precautions;Cardiopulmonary status limiting activity ? ?   ?  ?PT Treatment Interventions DME instruction;Gait training;Stair training;Therapeutic activities;Functional mobility training;Balance training;Therapeutic exercise;Patient/family education   ? ?PT Goals (Current goals can be found in the Care Plan section)  ?Acute Rehab PT Goals ?Patient Stated Goal: to go home ?PT Goal Formulation: With patient ?Time For Goal Achievement: 07/27/21 ?Potential to Achieve Goals: Good ? ?  ?Frequency Min 4X/week ?  ? ? ?Co-evaluation   ?  ?  ?  ?  ? ? ?  ?AM-PAC PT "6 Clicks" Mobility  ?Outcome Measure Help needed turning from your back to your side while in a flat bed without using bedrails?: None ?Help needed moving from lying on your back to sitting on the side of a flat bed without using bedrails?: A Little ?Help needed moving to and from a bed to a chair (including a wheelchair)?: A Little ?Help needed standing up from a chair using your arms (e.g., wheelchair or bedside chair)?: A Little ?Help needed  to walk in hospital room?: A Little ?Help needed climbing 3-5 steps with a railing? : A Lot ?6 Click Score: 18 ? ?  ?End of Session   ?Activity Tolerance: Treatment limited secondary to medical complications (Comment) (elevated HR) ?Patient left: in bed;with call bell/phone within reach;with family/visitor present (on stretcher in ED) ?Nurse Communication: Mobility status ?PT Visit Diagnosis: Unsteadiness on feet (R26.81);Other symptoms and signs involving the nervous system (R29.898);Ataxic gait (R26.0) ?  ? ?Time: 1100-1118 ?PT Time Calculation (min) (ACUTE ONLY): 18 min ? ? ?Charges:   PT Evaluation ?$PT Eval Moderate Complexity: 1 Mod ?  ?  ?   ? ? ?Reuel Derby, PT, DPT  ?Acute Rehabilitation Services  ?Pager: [phone] ?Office: [phone] ? ? ?[place] ?07/13/2021, 1:32 PM ?

## 2021-07-13 NOTE — Progress Notes (Addendum)
STROKE TEAM PROGRESS NOTE  ? ?INTERVAL HISTORY ?Patient states he went to bed around 11:30 PM last night and was feeling fine at that time.  He then woke up afterwards to use the bathroom when he noticed that he was having difficulty getting out of bed and fell backwards several times.  After he was finally able to get up, when he tried to walk he was constantly bumping into objects and could not maintain his balance.  States "my equilibrium was off." Resume eliquis and follow up CT ordered. Metoprolol PRN for HR greater than 140. ? ?Unable to obtain MRI due to retained foreign body.  ? ?Vitals:  ? 07/13/21 0530 07/13/21 0600 07/13/21 0630 07/13/21 0700  ?BP: (!) 113/102 (!) 122/109 (!) 131/102 (!) 136/105  ?Pulse: 76 (!) 136 90 90  ?Resp: '13 12 15 17  '$ ?Temp:      ?SpO2: 100% 100% 99% 99%  ? ?CBC:  ?Recent Labs  ?Lab 07/13/21 ?4332 07/13/21 ?0426  ?WBC 7.4  --   ?NEUTROABS 3.5  --   ?HGB 14.1 15.3  ?HCT 45.6 45.0  ?MCV 89.4  --   ?PLT 282  --   ? ?Basic Metabolic Panel:  ?Recent Labs  ?Lab 07/13/21 ?9518 07/13/21 ?0426  ?NA 140 143  ?K 4.2 4.0  ?CL 108 107  ?CO2 25  --   ?GLUCOSE 111* 110*  ?BUN 20 23  ?CREATININE 1.73* 1.80*  ?CALCIUM 9.6  --   ? ?Lipid Panel: No results for input(s): CHOL, TRIG, HDL, CHOLHDL, VLDL, LDLCALC in the last 168 hours. ?HgbA1c: No results for input(s): HGBA1C in the last 168 hours. ?Urine Drug Screen:  ?Recent Labs  ?Lab 07/13/21 ?0542  ?LABOPIA NONE DETECTED  ?COCAINSCRNUR NONE DETECTED  ?LABBENZ NONE DETECTED  ?AMPHETMU NONE DETECTED  ?THCU NONE DETECTED  ?LABBARB NONE DETECTED  ?  ?Alcohol Level  ?Recent Labs  ?Lab 07/13/21 ?8416  ?ETH <10  ? ? ?IMAGING past 24 hours ?CT HEAD CODE STROKE WO CONTRAST ? ?Result Date: 07/13/2021 ?CLINICAL DATA:  Code stroke. 76 year old male. Right PCA infarct in November. EXAM: CT HEAD WITHOUT CONTRAST TECHNIQUE: Contiguous axial images were obtained from the base of the skull through the vertex without intravenous contrast. RADIATION DOSE REDUCTION: This  exam was performed according to the departmental dose-optimization program which includes automated exposure control, adjustment of the mA and/or kV according to patient size and/or use of iterative reconstruction technique. COMPARISON:  CT head and CTA head and neck 02/27/2021. FINDINGS: Brain: Previous large right PCA territory infarct with encephalomalacia now. Small area of encephalomalacia in the right perirolandic cortex (series 3, image 28) has also progressed since November and may have been subacute at that time. Evolution also of infarction in the right corona radiata, internal capsule lateral right thalamus. No superimposed No midline shift, ventriculomegaly, mass effect, evidence of mass lesion, intracranial hemorrhage or evidence of cortically based acute infarction. Vascular: Calcified atherosclerosis at the skull base. No suspicious intracranial vascular hyperdensity. Skull: No acute osseous abnormality identified. Sinuses/Orbits: Visualized paranasal sinuses and mastoids are clear. 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 No gaze deviation, acute orbit or scalp soft tissue finding. ASPECTS [facility] Stroke Program Early CT Score) Total score (0-10 with 10 being normal): 10 (chronic encephalomalacia in the right MCA and PCA territories). IMPRESSION: 1. No acute cortically based infarct or acute intracranial hemorrhage identified. ASPECTS 10. 2. Evolution of encephalomalacia in both the Right MCA  and PCA territories since November. 3. Chronic retained metallic foreign body in the superior right orbit. 4. These results were communicated to Dr. Lorrin Goodell at 4:33 am on 07/13/2021 by text page via the [facility] messaging system. Electronically Signed   By: Genevie Ann M.D.   On: 07/13/2021 04:34  ? ?CT ANGIO HEAD NECK W WO CM (CODE STROKE) ? ?Result Date: 07/13/2021 ?CLINICAL DATA:  76 year old male code stroke.  Ataxia. EXAM: CT ANGIOGRAPHY  HEAD AND NECK TECHNIQUE: Multidetector CT imaging of the head and neck was performed using the standard protocol during bolus administration of intravenous contrast. Multiplanar CT image reconstructions and MIPs were obtained to evaluate the vascular anatomy. Carotid stenosis measurements (when applicable) are obtained utilizing NASCET criteria, using the distal internal carotid diameter as the denominator. RADIATION DOSE REDUCTION: This exam was performed according to the departmental dose-optimization program which includes automated exposure control, adjustment of the mA and/or kV according to patient size and/or use of iterative reconstruction technique. CONTRAST:  113m OMNIPAQUE IOHEXOL 350 MG/ML SOLN COMPARISON:  Plain head CT 0425 hours today. CTA head and neck 02/27/2021. FINDINGS: CTA NECK Skeleton: Absent dentition. 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 No acute osseous abnormality identified. Upper chest: Negative; visible central pulmonary arteries are patent. Other neck: No acute soft tissue finding in the neck. Chronic retained metallic foreign body in the right superior orbit. Aortic arch: Mildly tortuous aortic arch. Minimal arch atherosclerosis. Three vessel arch configuration. Right carotid system: Tortuous brachiocephalic artery and proximal right CCA but no plaque or stenosis. Negative right carotid bifurcation. No right ICA stenosis to the skull base. Left carotid system: Tortuous proximal left CCA with no plaque or stenosis. Negative left carotid bifurcation. Mildly tortuous left ICA without stenosis. Vertebral arteries: Tortuous proximal right subclavian artery without plaque or stenosis. Normal right vertebral artery origin. Right V1 and V2 junction of scared by dense paravertebral contrast, but the right vertebral artery remains patent to the skull base with tortuosity but no plaque or stenosis identified.  Non dominant left vertebral artery with stable caliber. Tortuous proximal left subclavian artery without plaque or stenosis. Left vertebral artery origin appears normal. There is also dense paravertebral venous contrast obscuring the left proximal V2 segment, but the left vertebral artery remains patent and appears stable to the skull base with no plaque or stenosis. CTA HEAD Posterior circulation: Mildly dominant right V4 segment. Distal vertebral arteries remain patent with no plaque or stenosis. Patent bilateral PICA origins and vertebrobasilar junction. Patent basilar artery without stenosis. Patent SCA and PCA origins. Posterior communicating arteries are diminutive or absent. Left PCA branches are stable and within normal limits. Right PCA remains patent proximally. There is a moderate irregularity and stenosis at the P2 segment on series 11, image 18, but distal PCA branch enhancement is preserved. Anterior circulation: Both ICA siphons are patent. No petrous, cavernous or anterior genu segment stenosis. Both supraclinoid segments appear mildly tapered which might be a function of the contrast timing today. There is no significant ICA stenosis. Carotid termini are patent with stable MCA and ACA origins. Dominant left A1 redemonstrated. Anterior communicating artery and bilateral ACA branches remain within normal limits. Left MCA M1 segment and bifurcation are patent without stenosis. Right MCA M1 segment and trifurcation are patent without stenosis. Bilateral MCA branches are stable allowing for earlier contrast timing today. Venous sinuses: Early contrast timing, but there is some enhancement of the superior sagittal sinus, torcula, transverse and  sigmoid sinuses. Anatomic variants: Dominant right vertebral artery, left ACA A1 segment. Review of the MIP images confirms the above findings IMPRESSION: 1. Negative for large vessel occlusion. 2. Little to no atherosclerosis in the head and neck. Moderate  irregularity of the Right PCA P2 segment which is likely the sequelae of the November infarct in that territory. No other significant arterial stenosis. 3. These results were communicated to Dr. Lorrin Goodell at 4:

## 2021-07-13 NOTE — Hospital Course (Signed)
Patient 76 year old gentleman history of hypertension, hyperlipidemia, A-fib on Eliquis, prior history of right PCA stroke November 2022 with some residual visual deficits and mild left-sided weakness, history of colon cancer status postresection, prostate cancer, depression/anxiety, CKD stage IIIb presented to the ED with left-sided ataxia.  CT head done negative for any acute abnormalities.  CT angiogram head and neck done with no LVO.  Patient seen in consultation by neurology and symptoms concerning for left cerebellar stroke not noted on imaging.  Patient unable to get MRI of the brain due to metallic object in orbit.  Patient initially placed on aspirin.  Patient seen in consultation for neurology patient admitted for stroke work-up and will likely need repeat CT head 24 hours from initial head CT. ?

## 2021-07-13 NOTE — Assessment & Plan Note (Addendum)
Patient reported compliance with metoprolol.  ?- Infection less likely to be a precipitating factor.   ?-PE less likely given no hypoxia, in addition, he is chronically anticoagulated.   ?-Patient was given a dose of IV metoprolol 2.5 mg in the ED on presentation and rate was remaining in the 120s. ?-Patient noted to have heart rates fluctuating from the low 110s to the 140s at rest. ?-Resumed back home regimen Toprol-XL at 50 mg daily with no significant improvement with heart rates. ?-Patient however asymptomatic however did comment with concerns for probable cerebellar stroke. ?-Continue IV Lopressor as needed. ?-Eliquis resumed per neurology recommendations. ?-Due to elevated heart rates, case discussed with patient's primary cardiologist Dr. Terrence Dupont who stated he had seen patient in the office approximately 2 weeks ago and at that time patient was supposed to be on 125 mg of Toprol-XL with heart rates controlled in the 60s at that time. ?-On initial presentation when discussed with patient and wife it was noted that patient was actually getting 50 mg of Toprol-XL at home per wife. ?-Increase Toprol-XL to 125 mg daily monitor over the next 24 hours and if heart rates not improved will formally consult patient's primary cardiologist Dr. Terrence Dupont. ?-Outpatient follow-up with primary cardiologist, Dr. Terrence Dupont. ?-Cardiac monitoring. ?

## 2021-07-13 NOTE — Progress Notes (Signed)
?PROGRESS NOTE ? ? ? ?George [name].  NAT:[id number] DOB: 03/21/1946 DOA: 07/13/2021 ?PCP: Allwardt, Randa Evens, PA-C  ? ? ?Chief Complaint  ?Patient presents with  ? Neurologic Problem  ? Code Stroke  ? ? ?Brief Narrative:  ?Patient 76 year old gentleman history of hypertension, hyperlipidemia, A-fib on Eliquis, prior history of right PCA stroke November 2022 with some residual visual deficits and mild left-sided weakness, history of colon cancer status postresection, prostate cancer, depression/anxiety, CKD stage IIIb presented to the ED with left-sided ataxia.  CT head done negative for any acute abnormalities.  CT angiogram head and neck done with no LVO.  Patient seen in consultation by neurology and symptoms concerning for left cerebellar stroke not noted on imaging.  Patient unable to get MRI of the brain due to metallic object in orbit.  Patient initially placed on aspirin.  Patient seen in consultation for neurology patient admitted for stroke work-up and will likely need repeat CT head 24 hours from initial head CT.  ? ? ?Assessment & Plan: ? Principal Problem: ?  Ataxia ?Active Problems: ?  Atrial fibrillation with RVR ([place]) ?  QT prolongation ?  Dyslipidemia ?  Essential hypertension ?  Chronic kidney disease, stage 3b ([place]) ? ? ? ?Assessment and Plan: ?* Ataxia ?Patient with history of prior right PCA stroke in November 2022 presenting with acute onset left-sided ataxia, LKW 2330 last night.  Not a tPA candidate due to being chronically anticoagulated with Eliquis.   ?-CT head negative for acute infarct or intracranial hemorrhage.  ?- CTA head and neck negative for LVO.   ?-Patient unable to get an MRI done because of chronically retained metallic foreign body in his right orbit seen on CT. Neurology concerned about possible left cerebellum stroke. ?-Appreciate neurology recommendations ?-Telemetry monitoring ?-Repeat CT head in 24 hours ?-No need to repeat echocardiogram per neurology as no change in  management. ?-Hemoglobin A1c noted at 5.4 (02/28/2021) ?-Fasting lipid panel 58 (02/28/2021) ?-Repeat fasting lipid panel pending. ?-Admitting physician discussed with Dr. Lorrin Goodell, recommending giving a dose of aspirin this morning and holding Eliquis until repeat CT head is done in 24 hours. ?-Frequent neurochecks ?-PT, OT, speech therapy. ?-Patient assessed by the stroke team who recommended resumption of Eliquis at this time pending repeat head CT to be done 24 hours from prior head CT. ?-Per neurology. ? ?Atrial fibrillation with RVR ([place]) ?Patient reported compliance with metoprolol.  ?- Infection less likely to be a precipitating factor.   ?-PE less likely given no hypoxia, in addition, he is chronically anticoagulated.   ?-Patient was given a dose of IV metoprolol 2.5 mg and rate currently 110-120. ?-Patient noted to have heart rates fluctuating from the low 110s to the 140s at rest. ?-Resume back home regimen Toprol-XL at 50 mg daily and uptitrate as needed for better rate control. ?-Continue IV Lopressor as needed. ?-Eliquis resumed per neurology recommendations. ?-Outpatient follow-up with primary cardiologist, Dr. Terrence Dupont. ?-Cardiac monitoring. ? ?Chronic kidney disease, stage 3b ([place]) ?-Creatinine 1.8, stable. ? ?Essential hypertension ?-Hold antihypertensives at this time to allow permissive hypertension except give low-dose IV metoprolol as needed for heart rate consistently above 120. ?-Resume home regimen Toprol XL due to A-fib with RVR. ? ?Dyslipidemia ?-Continue Lipitor.   ?-Fasting lipid panel pending.  ? ?QT prolongation ?-Cardiac monitoring ?-Monitor potassium and magnesium levels ?-Avoid QT prolonging drugs-hold home psychotropic medications at this time and repeat EKG. ? ? ? ? ?  ? ? ?DVT prophylaxis: Eliquis ?Code Status: Full ?Family  Communication: Updated patient and wife at bedside. ?Disposition: Likely home with home health pending PT evaluation. ? ?Status is: Inpatient ?Remains  inpatient appropriate because: Acute CVA, A-fib with rapid ventricular rate, severity of illness. ?  ?Consultants:  ?Neurology: Dr.Khaliqdina 07/14/2018 ? ?Procedures:  ?CT angiogram head and neck 07/13/2021 ?CT head without contrast 07/13/2021 ? ? ?Antimicrobials:  ?None ? ? ?Subjective: ?Patient laying on gurney in the ED.  Heart rate noted to occasionally go up into the 130s and trended back down into the low 100s.  No chest pain.  No shortness of breath.  States ambulation difficulty/disequilibrium improving.  Wife at bedside. ? ?Objective: ?Vitals:  ? 07/13/21 1415 07/13/21 1425 07/13/21 1530 07/13/21 1715  ?BP:  (!) 137/92 114/78   ?Pulse: 93  99   ?Resp: 16  (!) 22 20  ?Temp:      ?SpO2: 97%  99%   ? ? ?Intake/Output Summary (Last 24 hours) at 07/13/2021 1837 ?Last data filed at 07/13/2021 1721 ?Gross per 24 hour  ?Intake 350 ml  ?Output 550 ml  ?Net -200 ml  ? ?There were no vitals filed for this visit. ? ?Examination: ? ?General exam: Appears calm and comfortable  ?Respiratory system: Clear to auscultation. Respiratory effort normal. ?Cardiovascular system: Irregularly irregular.  No JVD.  No murmurs rubs or gallops.  No lower extremity edema. ?Gastrointestinal system: Abdomen is nondistended, soft and nontender. No organomegaly or masses felt. Normal bowel sounds heard. ?Central nervous system: Alert and oriented.  Left lower extremity ataxia with heel-to-shin.  ?Extremities: Symmetric 5 x 5 power. ?Skin: No rashes, lesions or ulcers ?Psychiatry: Judgement and insight appear normal. Mood & affect appropriate.  ? ? ? ?Data Reviewed:  ? ?CBC: ?Recent Labs  ?Lab 07/13/21 ?9892 07/13/21 ?0426  ?WBC 7.4  --   ?NEUTROABS 3.5  --   ?HGB 14.1 15.3  ?HCT 45.6 45.0  ?MCV 89.4  --   ?PLT 282  --   ? ? ?Basic Metabolic Panel: ?Recent Labs  ?Lab 07/13/21 ?1194 07/13/21 ?0426  ?NA 140 143  ?K 4.2 4.0  ?CL 108 107  ?CO2 25  --   ?GLUCOSE 111* 110*  ?BUN 20 23  ?CREATININE 1.73* 1.80*  ?CALCIUM 9.6  --   ? ? ?GFR: ?Estimated  Creatinine Clearance: 37.7 mL/min (A) (by C-G formula based on SCr of 1.8 mg/dL (H)). ? ?Liver Function Tests: ?Recent Labs  ?Lab 07/13/21 ?1740  ?AST 36  ?ALT 27  ?ALKPHOS 91  ?BILITOT 0.5  ?PROT 7.3  ?ALBUMIN 3.8  ? ? ?CBG: ?Recent Labs  ?Lab 07/13/21 ?0400  ?GLUCAP 116*  ? ? ? ?No results found for this or any previous visit (from the past 240 hour(s)).  ? ? ? ? ? ?Radiology Studies: ?CT HEAD CODE STROKE WO CONTRAST ? ?Result Date: 07/13/2021 ?CLINICAL DATA:  Code stroke. 76 year old male. Right PCA infarct in November. EXAM: CT HEAD WITHOUT CONTRAST TECHNIQUE: Contiguous axial images were obtained from the base of the skull through the vertex without intravenous contrast. RADIATION DOSE REDUCTION: This exam was performed according to the departmental dose-optimization program which includes automated exposure control, adjustment of the mA and/or kV according to patient size and/or use of iterative reconstruction technique. COMPARISON:  CT head and CTA head and neck 02/27/2021. FINDINGS: Brain: Previous large right PCA territory infarct with encephalomalacia now. Small area of encephalomalacia in the right perirolandic cortex (series 3, image 28) has also progressed since November and may have been subacute at that time. Evolution also  of infarction in the right corona radiata, internal capsule lateral right thalamus. No superimposed No midline shift, ventriculomegaly, mass effect, evidence of mass lesion, intracranial hemorrhage or evidence of cortically based acute infarction. Vascular: Calcified atherosclerosis at the skull base. No suspicious intracranial vascular hyperdensity. Skull: No acute osseous abnormality identified. Sinuses/Orbits: Visualized paranasal sinuses and mastoids are clear. 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 No gaze deviation, acute orbit or scalp soft tissue finding. ASPECTS [facility] Stroke Program Early CT  Score) Total score (0-10 with 10 being normal): 10 (chronic encephalomalacia in the right MCA and PCA territories). IMPRESSION: 1. No acute cortically based infarct or acute intracranial hemorrhage identified. ASPECTS 10

## 2021-07-13 NOTE — Assessment & Plan Note (Addendum)
-  Creatinine 1.69, stable. ?

## 2021-07-13 NOTE — ED Provider Notes (Signed)
?[place] ?Provider Note ? ? ?CSN: [id number] ?Arrival date & time: 07/13/21  0352 ? ?An emergency department physician performed an initial assessment on this suspected stroke patient at 43. ? ?History ? ?Chief Complaint  ?Patient presents with  ? Neurologic Problem  ? Code Stroke  ? ? ?George Devins. is a 76 y.o. male. ? ?The history is provided by the patient.  ?Neurologic Problem ?He has history of hypertension, hyperlipidemia, atrial fibrillation anticoagulated on apixaban and comes in because of inability to stand.  He was last known well at 11:30 PM when he went to sleep.  He woke up to go to the bathroom and noted that he was falling into the wall and door jam when he was trying to walk.  He did not fall in any 1 direction preferentially.  He denies headache, nausea, vomiting. ?  ?Home Medications ?Prior to Admission medications   ?Medication Sig Start Date End Date Taking? Authorizing Provider  ?acetaminophen (TYLENOL) 500 MG tablet Take 500 mg by mouth every 6 (six) hours as needed for mild pain or headache.   Yes [provider]  ?apixaban (ELIQUIS) 5 MG TABS tablet Take 1 tablet (5 mg total) by mouth 2 (two) times daily. 06/29/21  Yes Raulkar, Clide Deutscher, MD  ?atorvastatin (LIPITOR) 20 MG tablet Take 20 mg by mouth daily. 06/01/21  Yes [provider]  ?buPROPion (WELLBUTRIN XL) 150 MG 24 hr tablet Take 450 mg by mouth every morning.    Yes [provider]  ?busPIRone (BUSPAR) 10 MG tablet Take 10 mg by mouth 2 (two) times daily. 07/02/21  Yes [provider]  ?escitalopram (LEXAPRO) 5 MG tablet Take 1 tablet (5 mg total) by mouth daily. ?Patient taking differently: Take 10 mg by mouth daily. 04/17/21  Yes Raulkar, Clide Deutscher, MD  ?felodipine (PLENDIL) 10 MG 24 hr tablet Take 1 tablet (10 mg total) by mouth daily. ?Patient taking differently: Take 5 mg by mouth daily. 03/05/21  Yes Mercy Riding, MD  ?hydrOXYzine (ATARAX) 10 MG  tablet TAKE 1 TABLET BY MOUTH EVERY 6 HOURS AS NEEDED FOR ANXIETY OR  ITCHING ?Patient taking differently: Take 10 mg by mouth every 6 (six) hours as needed for anxiety or itching. 06/16/21  Yes Allwardt, Alyssa M, PA-C  ?losartan (COZAAR) 100 MG tablet Take 100 mg by mouth daily.   Yes [provider]  ?metoprolol succinate (TOPROL XL) 50 MG 24 hr tablet Take 2.5 tablets (125 mg total) by mouth daily. Take with or immediately following a meal. ?Patient taking differently: Take 100 mg by mouth daily. Take with or immediately following a meal. 03/13/21  Yes Love, Ivan Anchors, PA-C  ?Multiple Vitamin (MULTIVITAMIN WITH MINERALS) TABS tablet Take 1 tablet by mouth daily. 03/14/21  Yes Love, Ivan Anchors, PA-C  ?pregabalin (LYRICA) 25 MG capsule Take 1 capsule (25 mg total) by mouth 2 (two) times daily. 07/03/21 08/02/21 Yes Allwardt, Randa Evens, PA-C  ?Saline (OCEAN NASAL SPRAY NA) Place 1 spray into both nostrils as needed (congestion).   Yes [provider]  ?spironolactone (ALDACTONE) 25 MG tablet Take 1 tablet by mouth once daily ?Patient taking differently: Take 25 mg by mouth daily. 06/29/21  Yes Raulkar, Clide Deutscher, MD  ?vitamin B-12 1000 MCG tablet Take 1 tablet (1,000 mcg total) by mouth daily. 03/06/21  Yes Mercy Riding, MD  ?Vitamin D, Ergocalciferol, (DRISDOL) 1.25 MG (50000 UNIT) CAPS capsule TAKE 1 CAPSULE BY MOUTH ONCE A WEEK  ON FRIDAYS ?Patient taking differently: Take 50,000 Units by mouth every Friday. 06/18/21  Yes Raulkar, Clide Deutscher, MD  ?LORazepam (ATIVAN) 0.5 MG tablet Take 1 tablet (0.5 mg total) by mouth every 8 (eight) hours. ?Patient not taking: Reported on 07/03/2021 04/10/21   Izora Ribas, MD  ?   ? ?Allergies    ?Amlodipine and Gabapentin   ? ?Review of Systems   ?Review of Systems  ?All other systems reviewed and are negative. ? ?Physical Exam ?Updated Vital Signs ?BP (!) 139/102   Pulse (!) 138   Temp 98.9 ?F (37.2 ?C)   Resp 17   SpO2 99%  ?Physical Exam ?Vitals and nursing  note reviewed.  ?76 year old male, resting comfortably and in no acute distress. Vital signs are significant for rapid heart rate, elevated blood pressure. Oxygen saturation is 99%, which is normal. ?Head is normocephalic and atraumatic. PERRLA, EOMI. Oropharynx is clear. ?Neck is nontender and supple without adenopathy or JVD.  There are no carotid bruits. ?Back is nontender and there is no CVA tenderness. ?Lungs are clear without rales, wheezes, or rhonchi. ?Chest is nontender. ?Heart is tachycardic with an irregular rhythm without murmur. ?Abdomen is soft, flat, nontender without masses or hepatosplenomegaly and peristalsis is normoactive. ?Extremities have no cyanosis or edema, full range of motion is present. ?Skin is warm and dry without rash. ?Neurologic: Awake and alert.  Oriented x3.  Speech is spontaneous and appropriate.  Cranial nerves are intact.  Strength is 5/5 in all 4 extremities.  There is no pronator drift.  There is no extinction on double simultaneous stimulation.  Finger-to-nose testing is significantly ataxic on the left, normal on the right.. ? ?ED Results / Procedures / Treatments   ?Labs ?(all labs ordered are listed, but only abnormal results are displayed) ?Labs Reviewed  ?COMPREHENSIVE METABOLIC PANEL - Abnormal; Notable for the following components:  ?    Result Value  ? Glucose, Bld 111 (*)   ? Creatinine, Ser 1.73 (*)   ? GFR, Estimated 40 (*)   ? All other components within normal limits  ?CBG MONITORING, ED - Abnormal; Notable for the following components:  ? Glucose-Capillary 116 (*)   ? All other components within normal limits  ?I-STAT CHEM 8, ED - Abnormal; Notable for the following components:  ? Creatinine, Ser 1.80 (*)   ? Glucose, Bld 110 (*)   ? All other components within normal limits  ?RESP PANEL BY RT-PCR (FLU A&B, COVID) ARPGX2  ?ETHANOL  ?PROTIME-INR  ?APTT  ?CBC  ?DIFFERENTIAL  ?RAPID URINE DRUG SCREEN, HOSP PERFORMED  ?URINALYSIS, ROUTINE W REFLEX MICROSCOPIC   ? ? ?EKG ?EKG Interpretation ? ?Date/Time:  Monday July 13 2021 04:10:39 EDT ?Ventricular Rate:  161 ?PR Interval:    ?QRS Duration: 88 ?QT Interval:  320 ?QTC Calculation: 523 ?R Axis:   -91 ?Text Interpretation: Atrial flutter with variable A-V block Left axis deviation Anterior infarct , age undetermined Abnormal ECG When compared with ECG of 02-Apr-2021 08:52, Atrial flutter has replaced Sinus rhythm Confirmed by Delora Fuel ([id number]) on 07/13/2021 4:55:11 AM ? ?Radiology ?CT HEAD CODE STROKE WO CONTRAST ? ?Result Date: 07/13/2021 ?CLINICAL DATA:  Code stroke. 76 year old male. Right PCA infarct in November. EXAM: CT HEAD WITHOUT CONTRAST TECHNIQUE: Contiguous axial images were obtained from the base of the skull through the vertex without intravenous contrast. RADIATION DOSE REDUCTION: This exam was performed according to the departmental dose-optimization program which includes automated exposure control, adjustment of the mA and/or  kV according to patient size and/or use of iterative reconstruction technique. COMPARISON:  CT head and CTA head and neck 02/27/2021. FINDINGS: Brain: Previous large right PCA territory infarct with encephalomalacia now. Small area of encephalomalacia in the right perirolandic cortex (series 3, image 28) has also progressed since November and may have been subacute at that time. Evolution also of infarction in the right corona radiata, internal capsule lateral right thalamus. No superimposed No midline shift, ventriculomegaly, mass effect, evidence of mass lesion, intracranial hemorrhage or evidence of cortically based acute infarction. Vascular: Calcified atherosclerosis at the skull base. No suspicious intracranial vascular hyperdensity. Skull: No acute osseous abnormality identified. Sinuses/Orbits: Visualized paranasal sinuses and mastoids are clear. 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 No gaze  deviation, acute orbit or scalp soft tissue finding. ASPECTS [facility] Stroke Program Early CT Score) Total score (0-10 with 10 being normal): 10 (chronic encephalomalacia in the right MCA and PCA territories). IMPRESSION: 1. N

## 2021-07-13 NOTE — ED Notes (Signed)
Code Stx activated  ?

## 2021-07-13 NOTE — Assessment & Plan Note (Addendum)
Patient with history of prior right PCA stroke in November 2022 presenting with acute onset left-sided ataxia, LKW 2330 last night.  Not a tPA candidate due to being chronically anticoagulated with Eliquis.   ?-CT head negative for acute infarct or intracranial hemorrhage.  ?- CTA head and neck negative for LVO.   ?-Patient unable to get an MRI done because of chronically retained metallic foreign body in his right orbit seen on CT. Neurology concerned about possible left cerebellum stroke. ?-Appreciate neurology recommendations ?-Telemetry monitoring ?-Repeat CT head in 24 hours ?-No need to repeat echocardiogram per neurology as no change in management. ?-Hemoglobin A1c noted at 5.4 (02/28/2021) ?-Fasting lipid panel 58 (02/28/2021) ?-Repeat fasting lipid panel with LDL of 19.. ?-Admitting physician discussed with Dr. Lorrin Goodell, recommending giving a dose of aspirin initially on presentation and holding Eliquis until repeat head CT was done.   ?-Repeat head CT with no acute abnormalities noted.  ?-Frequent neurochecks ?-PT, OT, speech therapy. ?-Patient assessed by the stroke team who recommended resumption of Eliquis which was resumed on 07/13/2021.  ?-Per neurology. ?

## 2021-07-13 NOTE — Assessment & Plan Note (Signed)
-  Cardiac monitoring ?-Monitor potassium and magnesium levels ?-Avoid QT prolonging drugs-hold home psychotropic medications at this time and repeat EKG. ?

## 2021-07-14 ENCOUNTER — Inpatient Hospital Stay (HOSPITAL_COMMUNITY): Payer: Medicare Other

## 2021-07-14 ENCOUNTER — Other Ambulatory Visit (HOSPITAL_COMMUNITY): Payer: Self-pay

## 2021-07-14 DIAGNOSIS — Z7901 Long term (current) use of anticoagulants: Secondary | ICD-10-CM | POA: Diagnosis not present

## 2021-07-14 DIAGNOSIS — R270 Ataxia, unspecified: Secondary | ICD-10-CM | POA: Diagnosis not present

## 2021-07-14 DIAGNOSIS — I639 Cerebral infarction, unspecified: Secondary | ICD-10-CM | POA: Diagnosis not present

## 2021-07-14 DIAGNOSIS — I4891 Unspecified atrial fibrillation: Secondary | ICD-10-CM | POA: Diagnosis not present

## 2021-07-14 LAB — LIPID PANEL
Cholesterol: 111 mg/dL (ref 0–200)
HDL: 33 mg/dL — ABNORMAL LOW (ref >40)
LDL Cholesterol: 19 mg/dL (ref 0–99)
Total CHOL/HDL Ratio: 3.4 RATIO
Triglycerides: 295 mg/dL — ABNORMAL HIGH (ref <150)
VLDL: 59 mg/dL — ABNORMAL HIGH (ref 0–40)

## 2021-07-14 LAB — BASIC METABOLIC PANEL
Anion gap: 7 (ref 5–15)
BUN: 18 mg/dL (ref 8–23)
CO2: 23 mmol/L (ref 22–32)
Calcium: 8.7 mg/dL — ABNORMAL LOW (ref 8.9–10.3)
Chloride: 110 mmol/L (ref 98–111)
Creatinine, Ser: 1.69 mg/dL — ABNORMAL HIGH (ref 0.61–1.24)
GFR, Estimated: 41 mL/min — ABNORMAL LOW (ref >60)
Glucose, Bld: 89 mg/dL (ref 70–99)
Potassium: 3.9 mmol/L (ref 3.5–5.1)
Sodium: 140 mmol/L (ref 135–145)

## 2021-07-14 LAB — MAGNESIUM: Magnesium: 2.1 mg/dL (ref 1.7–2.4)

## 2021-07-14 MED ORDER — METOPROLOL SUCCINATE ER 25 MG PO TB24
125.0000 mg | ORAL_TABLET | Freq: Every day | ORAL | 1 refills | Status: DC
  Filled 2021-07-14 – 2021-07-15 (×2): qty 30, 6d supply, fill #0

## 2021-07-14 MED ORDER — METOPROLOL SUCCINATE ER 25 MG PO TB24
125.0000 mg | ORAL_TABLET | Freq: Every day | ORAL | Status: DC
  Administered 2021-07-15: 125 mg via ORAL
  Filled 2021-07-14: qty 1

## 2021-07-14 MED ORDER — METOPROLOL SUCCINATE ER 50 MG PO TB24
100.0000 mg | ORAL_TABLET | Freq: Every day | ORAL | 1 refills | Status: DC
Start: 2021-07-14 — End: 2021-09-15
  Filled 2021-07-14: qty 60, 30d supply, fill #0

## 2021-07-14 MED ORDER — METOPROLOL SUCCINATE ER 50 MG PO TB24
50.0000 mg | ORAL_TABLET | Freq: Once | ORAL | Status: AC
  Administered 2021-07-14: 50 mg via ORAL
  Filled 2021-07-14: qty 1

## 2021-07-14 MED ORDER — METOPROLOL SUCCINATE ER 100 MG PO TB24
100.0000 mg | ORAL_TABLET | Freq: Every day | ORAL | Status: DC
Start: 2021-07-15 — End: 2021-07-14

## 2021-07-14 MED ORDER — METOPROLOL SUCCINATE ER 25 MG PO TB24
25.0000 mg | ORAL_TABLET | Freq: Once | ORAL | Status: AC
  Administered 2021-07-14: 25 mg via ORAL
  Filled 2021-07-14: qty 1

## 2021-07-14 NOTE — Evaluation (Signed)
Occupational Therapy Evaluation ?Patient Details ?Name: George [name]. ?MRN: [id number] ?DOB: [birth year] ?Today's Date: 07/14/2021 ? ? ?History of Present Illness Pt is a 76 y/o male admitted secondary to L sided ataxia. PMH includes a fib, HTN, CVA with residual visual and L weakness, colon cancer, prostate cancer, and L TKA.  ? ?Clinical Impression ?  ?PTA, pt lives with spouse, typically Independent with ADLs, most IADLs and mobility without AD. Pt denies any recent falls and reports staying active since initial CVA in November to return to full independence. Pt presents now without new deficits that would impact daily tasks. Pt overall Independent with UB ADL, min guard for LB ADLs and min guard for mobility without AD (mild staggering but able to correct; reports recent baseline functioning). Will follow acutely for UE HEP for strengthening and coordination, as well as balance during daily tasks. Anticipate no OT needs at DC. ? ?HR low 100s at rest, a fib up to 142 with bathroom mobility  ?   ? ?Recommendations for follow up therapy are one component of a multi-disciplinary discharge planning process, led by the attending physician.  Recommendations may be updated based on patient status, additional functional criteria and insurance authorization.  ? ?Follow Up Recommendations ? No OT follow up  ?  ?Assistance Recommended at Discharge PRN  ?Patient can return home with the following Assistance with cooking/housework;Assist for transportation ? ?  ?Functional Status Assessment ? Patient has had a recent decline in their functional status and demonstrates the ability to make significant improvements in function in a reasonable and predictable amount of time.  ?Equipment Recommendations ? None recommended by OT  ?  ?Recommendations for Other Services   ? ? ?  ?Precautions / Restrictions Precautions ?Precautions: Fall ?Precaution Comments: watch HR ?Restrictions ?Weight Bearing Restrictions: No  ? ?  ? ?Mobility Bed  Mobility ?Overal bed mobility: Modified Independent ?Bed Mobility: Supine to Sit, Sit to Supine ?  ?  ?Supine to sit: Modified independent (Device/Increase time) ?Sit to supine: Modified independent (Device/Increase time) ?  ?  ?  ? ?Transfers ?Overall transfer level: Needs assistance ?Equipment used: None ?Transfers: Sit to/from Stand ?Sit to Stand: Supervision ?  ?  ?  ?  ?  ?  ?  ? ?  ?Balance Overall balance assessment: Needs assistance ?Sitting-balance support: No upper extremity supported, Feet supported ?Sitting balance-Leahy Scale: Good ?  ?  ?Standing balance support: No upper extremity supported ?Standing balance-Leahy Scale: Fair ?Standing balance comment: can mobilize without AD though some staggering steps and able to correct ?  ?  ?  ?  ?  ?  ?  ?  ?  ?  ?  ?   ? ?ADL either performed or assessed with clinical judgement  ? ?ADL Overall ADL's : Needs assistance/impaired ?Eating/Feeding: Independent ?  ?Grooming: Modified independent;Standing ?  ?Upper Body Bathing: Independent ?  ?Lower Body Bathing: Min guard ?  ?Upper Body Dressing : Independent ?  ?Lower Body Dressing: Min guard ?  ?Toilet Transfer: Min guard ?Toilet Transfer Details (indicate cue type and reason): mild staggering but able to correct without overt LOB for bathroom mobility. increased difficulty when trying to navigate IV pole ?Toileting- Clothing Manipulation and Hygiene: Supervision/safety ?  ?  ?  ?Functional mobility during ADLs: Min guard ?General ADL Comments: Reports desire to get back to full independence, including driving - encouraged continued eye MD and neuro MD follow-up for driving readiness  ? ? ? ?Vision Baseline Vision/History: 1  Wears glasses ?Ability to See in Adequate Light: 1 Impaired ?Patient Visual Report: No change from baseline ?Vision Assessment?: Vision impaired- to be further tested in functional context ?Additional Comments: report hx of being told he has L peripheral vision deficits though got new glasses  and feels his vision has improved  ?   ?Perception   ?  ?Praxis   ?  ? ?Pertinent Vitals/Pain Pain Assessment ?Pain Assessment: No/denies pain  ? ? ? ?Hand Dominance Right ?  ?Extremity/Trunk Assessment Upper Extremity Assessment ?Upper Extremity Assessment: LUE deficits/detail ?LUE Deficits / Details: hx of weakness from CVA per chart but fairly strong 4-/5 to 4/5; minor coordination deficits but functional ?LUE Coordination: decreased fine motor ?  ?Lower Extremity Assessment ?Lower Extremity Assessment: Defer to PT evaluation ?  ?Cervical / Trunk Assessment ?Cervical / Trunk Assessment: Normal ?  ?Communication Communication ?Communication: No difficulties ?  ?Cognition Arousal/Alertness: Awake/alert ?Behavior During Therapy: [facility] for tasks assessed/performed ?Overall Cognitive Status: History of cognitive impairments - at baseline ?  ?  ?  ?  ?  ?  ?  ?  ?  ?  ?  ?  ?  ?  ?  ?  ?General Comments: shows insight into deficits and able to give detailed accounts of what exercises he completes at home. ?  ?  ?General Comments  HR in a fib up to 142 with bathroom mobility, normalizes to low 100s with rest break but variably bouncing up to 147 - RNs present and giving medications. Pt reprots he can feel HR elevating more so when anxiety attack occurs. ? ?  ?Exercises   ?  ?Shoulder Instructions    ? ? ?Home Living Family/patient expects to be discharged to:: Private residence ?Living Arrangements: Spouse/significant other ?Available Help at Discharge: Family;Available PRN/intermittently ?Type of Home: House ?Home Access: Level entry ?  ?  ?Home Layout: One level ?  ?  ?Bathroom Shower/Tub: Gaffer;Tub/shower unit ?  ?Bathroom Toilet: Standard ?  ?  ?Home Equipment: Conservation officer, nature (2 wheels) ?  ?  ?  ? ?  ?Prior Functioning/Environment Prior Level of Function : Independent/Modified Independent ?  ?  ?  ?  ?  ?  ?Mobility Comments: no use of AD, no falls ?ADLs Comments: has not been driving since CVA in 11/23;  Independent with ADLs, IADLs (sometimes goes grocery shopping). Walks approx 1 mile frequently in the park, active and exercises often ?  ? ?  ?  ?OT Problem List: Decreased strength;Decreased coordination;Impaired balance (sitting and/or standing);Impaired vision/perception ?  ?   ?OT Treatment/Interventions: Self-care/ADL training;Therapeutic exercise;Energy conservation;DME and/or AE instruction;Therapeutic activities;Patient/family education;Balance training  ?  ?OT Goals(Current goals can be found in the care plan section) Acute Rehab OT Goals ?Patient Stated Goal: be able to get back to driving ?OT Goal Formulation: With patient ?Time For Goal Achievement: 07/28/21 ?Potential to Achieve Goals: Good  ?OT Frequency: Min 2X/week ?  ? ?Co-evaluation   ?  ?  ?  ?  ? ?  ?AM-PAC OT "6 Clicks" Daily Activity     ?Outcome Measure Help from another person eating meals?: None ?Help from another person taking care of personal grooming?: None ?Help from another person toileting, which includes using toliet, bedpan, or urinal?: A Little ?Help from another person bathing (including washing, rinsing, drying)?: A Little ?Help from another person to put on and taking off regular upper body clothing?: None ?Help from another person to put on and taking off regular lower body clothing?:  A Little ?6 Click Score: 21 ?  ?End of Session Nurse Communication: Mobility status ? ?Activity Tolerance: Patient tolerated treatment well ?Patient left: in bed;with call bell/phone within reach;with family/visitor present ? ?OT Visit Diagnosis: Unsteadiness on feet (R26.81)  ?              ?Time: 5800-6349 ?OT Time Calculation (min): 22 min ?Charges:  OT General Charges ?$OT Visit: 1 Visit ?OT Evaluation ?$OT Eval Low Complexity: 1 Low ? ?Malachy Chamber, OTR/L ?Acute Rehab Services ?Office: [phone]  ? ?George [name] ?07/14/2021, 7:58 AM ?

## 2021-07-14 NOTE — Progress Notes (Signed)
Physical Therapy Treatment ?Patient Details ?Name: George [name]. ?MRN: [id number] ?DOB: 1945/05/30 ?Today's Date: 07/14/2021 ? ? ?History of Present Illness Pt is a 76 y/o male admitted secondary to L sided ataxia. PMH includes a fib, HTN, CVA with residual visual and L weakness, colon cancer, prostate cancer, and L TKA. ? ?  ?PT Comments  ? ? Patient mobilizing at his baseline (pt pt) with mostly antalgic gait due to bil foot neuropathy and no shoes present for walking. Ambulated 140 ft with supervision with HR max 137 bpm and BP 162/146 LUE after ambulation. RN made aware. No further PT needs at this time as pt is near his baseline.  ?   ?Recommendations for follow up therapy are one component of a multi-disciplinary discharge planning process, led by the attending physician.  Recommendations may be updated based on patient status, additional functional criteria and insurance authorization. ? ?Follow Up Recommendations ? No PT follow up ?  ?  ?Assistance Recommended at Discharge PRN  ?Patient can return home with the following Help with stairs or ramp for entrance;Assistance with cooking/housework ?  ?Equipment Recommendations ? None recommended by PT  ?  ?Recommendations for Other Services   ? ? ?  ?Precautions / Restrictions Precautions ?Precautions: Fall ?Precaution Comments: watch HR ?Restrictions ?Weight Bearing Restrictions: No  ?  ? ?Mobility ? Bed Mobility ?Overal bed mobility: Modified Independent ?Bed Mobility: Supine to Sit, Sit to Supine ?  ?  ?Supine to sit: Modified independent (Device/Increase time) ?Sit to supine: Modified independent (Device/Increase time) ?  ?  ?  ? ?Transfers ?Overall transfer level: Independent ?Equipment used: None ?Transfers: Sit to/from Stand ?Sit to Stand: Independent ?  ?  ?  ?  ?  ?  ?  ? ?Ambulation/Gait ?Ambulation/Gait assistance: Supervision ?Gait Distance (Feet): 140 Feet ?Assistive device: None ?Gait Pattern/deviations: Step-through pattern, Decreased stride length,  Ataxic ?Gait velocity: Decreased ?  ?  ?General Gait Details: gait slightly antalgic due to lack of shoes and bil foot neuropathy per pt; with incr distance noted left knee hyperextension which pt reports is normal for him and that he does not wear any type of brace for LLE ? ? ?Stairs ?  ?  ?  ?  ?  ? ? ?Wheelchair Mobility ?  ? ?Modified Rankin (Stroke Patients Only) ?Modified Rankin (Stroke Patients Only) ?Pre-Morbid Rankin Score: Slight disability ?Modified Rankin: Slight disability ? ? ?  ?Balance Overall balance assessment: Needs assistance ?Sitting-balance support: No upper extremity supported, Feet supported ?Sitting balance-Leahy Scale: Good ?  ?  ?Standing balance support: No upper extremity supported ?Standing balance-Leahy Scale: Fair ?  ?  ?  ?  ?  ?  ?  ?  ?  ?  ?  ?  ?  ? ?  ?Cognition Arousal/Alertness: Awake/alert ?Behavior During Therapy: [place] for tasks assessed/performed ?Overall Cognitive Status: History of cognitive impairments - at baseline ?  ?  ?  ?  ?  ?  ?  ?  ?  ?  ?  ?  ?  ?  ?  ?  ?General Comments: shows insight into deficits ?  ?  ? ?  ?Exercises   ? ?  ?General Comments General comments (skin integrity, edema, etc.): HR afib with max 137 bpm; BP after walking LUE 162/146 RUE 147/113 ?  ?  ? ?Pertinent Vitals/Pain Pain Assessment ?Pain Assessment: No/denies pain  ? ? ?Home Living Family/patient expects to be discharged to:: Private residence ?Living Arrangements: Spouse/significant other ?  Available Help at Discharge: Family;Available PRN/intermittently ?Type of Home: House ?Home Access: Level entry ?  ?  ?  ?Home Layout: One level ?Home Equipment: Conservation officer, nature (2 wheels) ?   ?  ?Prior Function    ?  ?  ?   ? ?PT Goals (current goals can now be found in the care plan section) Acute Rehab PT Goals ?Patient Stated Goal: to go home ?PT Goal Formulation: With patient ?Time For Goal Achievement: 07/27/21 ?Potential to Achieve Goals: Good ?Progress towards PT goals: Goals met/education  completed, patient discharged from PT ? ?  ?Frequency ? ? ? Min 4X/week ? ? ? ?  ?PT Plan Discharge plan needs to be updated  ? ? ?Co-evaluation   ?  ?  ?  ?  ? ?  ?AM-PAC PT "6 Clicks" Mobility   ?Outcome Measure ? Help needed turning from your back to your side while in a flat bed without using bedrails?: None ?Help needed moving from lying on your back to sitting on the side of a flat bed without using bedrails?: None ?Help needed moving to and from a bed to a chair (including a wheelchair)?: None ?Help needed standing up from a chair using your arms (e.g., wheelchair or bedside chair)?: None ?Help needed to walk in hospital room?: A Little ?Help needed climbing 3-5 steps with a railing? : A Little ?6 Click Score: 22 ? ?  ?End of Session Equipment Utilized During Treatment: Gait belt ?Activity Tolerance: Patient tolerated treatment well ?Patient left: in bed;with call bell/phone within reach;with family/visitor present (on stretcher in ED) ?Nurse Communication: Mobility status;Other (comment) (elevated BP) ?PT Visit Diagnosis: Unsteadiness on feet (R26.81);Other symptoms and signs involving the nervous system (R29.898);Ataxic gait (R26.0) ?  ? ? ?Time: 0786-7544 ?PT Time Calculation (min) (ACUTE ONLY): 13 min ? ?Charges:  $Gait Training: 8-22 mins          ?          ? ? ?Arby Barrette, PT ?Acute Rehabilitation Services  ?Pager [phone] ?Office [phone] ? ? ? ?[name] ?07/14/2021, 10:56 AM ? ?

## 2021-07-14 NOTE — Plan of Care (Signed)
Patient admitted for Ataxia, arrived into the unit at about 2050. Patient is alert and oriented, x 4.  BP elevated on arrival but WNL no prn required per order. Skin assessment done with another nurse, no skin issue identified,. Patient was oriented to room and equipment, call light within reach, will continue to monitor. ?

## 2021-07-14 NOTE — Evaluation (Signed)
Speech Language Pathology Evaluation ?Patient Details ?Name: George [name]. ?MRN: [id number] ?DOB: 1945-11-07 ?Today's Date: 07/14/2021 ?Time: 4132-4401 ?SLP Time Calculation (min) (ACUTE ONLY): 24 min ? ?Problem List:  ?Patient Active Problem List  ? Diagnosis Date Noted  ? Ataxia 07/13/2021  ? Chronic kidney disease, stage 3b ([place]) 07/13/2021  ? Stroke [facility]) 07/13/2021  ? AKI (acute kidney injury) ([place])   ? Dyslipidemia   ? Essential hypertension   ? Hemiparesis affecting left side as late effect of stroke ([place])   ? Subcortical infarction ([place]) 03/05/2021  ? Recurrent major depressive disorder, in partial remission ([place])   ? CVA (cerebral vascular accident) ([place]) 02/27/2021  ? Atrial fibrillation with RVR ([place]) 02/27/2021  ? Hypokalemia 02/27/2021  ? QT prolongation 02/27/2021  ? Epistaxis, recurrent 07/04/2019  ? Polyneuropathy 01/31/2018  ? Chemotherapy-induced neuropathy ([place]) 01/31/2018  ? B12 deficiency 01/31/2018  ? Prediabetes 01/31/2018  ? Basal ganglia infarction ([place]) 01/31/2018  ? SBO (small bowel obstruction) ([place]) 10/20/2017  ? Deviated septum 06/28/2017  ? Nasal turbinate hypertrophy 06/28/2017  ? Rhinitis, chronic 06/28/2017  ? Primary localized osteoarthritis of right knee 05/31/2016  ? Primary localized osteoarthrosis of the knee, right 05/19/2016  ? S/P total knee arthroplasty, left 05/19/2016  ? Dyspnea 01/20/2016  ? DJD (degenerative joint disease) of knee 04/21/2015  ? Primary localized osteoarthritis of left knee 04/09/2015  ? Generalized anxiety disorder 04/09/2015  ? Panic attacks 04/09/2015  ? Spermatocele 05/08/2014  ? Benign localized hyperplasia of prostate with urinary obstruction 05/07/2014  ? Malignant neoplasm of prostate ([place]) 04/22/2014  ? ?Past Medical History:  ?Past Medical History:  ?Diagnosis Date  ? Anxiety   ? Arthritis   ? OSTEO IN KNEE  ? Asthma   ? as child  ? Colon cancer ([place])   ? Depression   ? Elevated prostate specific antigen (PSA)   ? Generalized anxiety disorder  04/09/2015  ? Hematospermia   ? Hx antineoplastic chemotherapy 2010  ? Hypertension   ? Idiopathic peripheral neuropathy   ? TOES OF BOTH FEET DUE TO CHEMO  ? Incomplete bladder emptying   ? Malignant neoplasm of prostate ([place])   ? Nodular prostate with lower urinary tract symptoms   ? Panic attacks 04/09/2015  ? Primary localized osteoarthrosis of the knee, right 05/19/2016  ? Prostatitis   ? S/P total knee arthroplasty, left 05/19/2016  ? Spermatocele   ? Weak urinary stream   ? ?Past Surgical History:  ?Past Surgical History:  ?Procedure Laterality Date  ? COLON RESECTION   NOV 2009  ? COLONOSCOPY    ? PROSTATE BIOPSY    ? SPERMATOCELECTOMY Left 05/07/2014  ? Procedure: LEFT SPERMATOCELECTOMY;  Surgeon: Malka So, MD;  Location: [facility];  Service: Urology;  Laterality: Left;  ? TONSILLECTOMY    ? TOTAL KNEE ARTHROPLASTY Left 04/21/2015  ? Procedure: LEFT TOTAL KNEE ARTHROPLASTY;  Surgeon: Elsie Saas, MD;  Location: [place];  Service: Orthopedics;  Laterality: Left;  ? TOTAL KNEE ARTHROPLASTY Right 05/31/2016  ? Procedure: TOTAL KNEE ARTHROPLASTY;  Surgeon: Elsie Saas, MD;  Location: [place];  Service: Orthopedics;  Laterality: Right;  ? TRANSURETHRAL RESECTION OF PROSTATE N/A 05/07/2014  ? Procedure: TRANSURETHRAL RESECTION OF THE PROSTATE WITH GYRUS INSTRUMENTS;  Surgeon: Malka So, MD;  Location: [facility];  Service: Urology;  Laterality: N/A;  ? ?HPI:  ?Pt is a 76 y/o male admitted secondary to L sided ataxia. PMH includes a fib, HTN, CVA (residual visual and L weakness, working on  cognition with SLP while on CIR but d/c prior to meeting goals for alternating attention and anticipatory awareness), colon cancer, prostate cancer, and L TKA.  ? ?Assessment / Plan / Recommendation ?Clinical Impression ? Pt believes that he is at his cognitive-linguistic baseline, mildly improved from when he was on CIR and without acute changes this admission. He scored 28/30 on the SLUMS, consistent with his past score, although  suspect that his points lost may have been a result of mishearing a prompt. Either way, his score falls within a range that is [facility] and his wife also agrees that he is at his baseline. SLP to sign off acutely, and would recommend using the same precautions/strategies he has been using at home. ?   ?SLP Assessment ? SLP Recommendation/Assessment: Patient does not need any further [place] [facility] Services ?SLP Visit Diagnosis: Cognitive communication deficit (R41.841)  ?  ?Recommendations for follow up therapy are one component of a multi-disciplinary discharge planning process, led by the attending physician.  Recommendations may be updated based on patient status, additional functional criteria and insurance authorization. ?   ?Follow Up Recommendations ? No SLP follow up  ?  ?Assistance Recommended at Discharge ?    ?Functional Status Assessment Patient has not had a recent decline in their functional status  ?Frequency and Duration    ?  ?  ?   ?SLP Evaluation ?Cognition ? Overall Cognitive Status: History of cognitive impairments - at baseline  ?  ?   ?Comprehension ? Auditory Comprehension ?Overall Auditory Comprehension: Appears within functional limits for tasks assessed  ?  ?Expression Expression ?Primary Mode of Expression: Verbal ?Verbal Expression ?Overall Verbal Expression: Appears within functional limits for tasks assessed   ?Oral / Motor ? Motor Speech ?Overall Motor Speech: Appears within functional limits for tasks assessed   ?        ? ?Osie Bond., M.A. CCC-SLP ?Acute Rehabilitation Services ?Office [phone] ? ?Secure chat preferred ? ?07/14/2021, 3:07 PM ? ?

## 2021-07-14 NOTE — Progress Notes (Signed)
?PROGRESS NOTE ? ? ? ?George [name].  UTM:[id number] DOB: 1946/01/15 DOA: 07/13/2021 ?PCP: Allwardt, Randa Evens, PA-C  ? ? ?Chief Complaint  ?Patient presents with  ? Neurologic Problem  ? Code Stroke  ? ? ?Brief Narrative:  ?Patient 76 year old gentleman history of hypertension, hyperlipidemia, A-fib on Eliquis, prior history of right PCA stroke November 2022 with some residual visual deficits and mild left-sided weakness, history of colon cancer status postresection, prostate cancer, depression/anxiety, CKD stage IIIb presented to the ED with left-sided ataxia.  CT head done negative for any acute abnormalities.  CT angiogram head and neck done with no LVO.  Patient seen in consultation by neurology and symptoms concerning for left cerebellar stroke not noted on imaging.  Patient unable to get MRI of the brain due to metallic object in orbit.  Patient initially placed on aspirin.  Patient seen in consultation for neurology patient admitted for stroke work-up and will likely need repeat CT head 24 hours from initial head CT.  ? ? ?Assessment & Plan: ? Principal Problem: ?  Ataxia ?Active Problems: ?  Atrial fibrillation with RVR ([place]) ?  QT prolongation ?  Dyslipidemia ?  Essential hypertension ?  Chronic kidney disease, stage 3b ([place]) ?  Stroke [facility]) ? ? ? ?Assessment and Plan: ?* Ataxia ?Patient with history of prior right PCA stroke in November 2022 presenting with acute onset left-sided ataxia, LKW 2330 last night.  Not a tPA candidate due to being chronically anticoagulated with Eliquis.   ?-CT head negative for acute infarct or intracranial hemorrhage.  ?- CTA head and neck negative for LVO.   ?-Patient unable to get an MRI done because of chronically retained metallic foreign body in his right orbit seen on CT. Neurology concerned about possible left cerebellum stroke. ?-Appreciate neurology recommendations ?-Telemetry monitoring ?-Repeat CT head in 24 hours ?-No need to repeat echocardiogram per neurology as  no change in management. ?-Hemoglobin A1c noted at 5.4 (02/28/2021) ?-Fasting lipid panel 58 (02/28/2021) ?-Repeat fasting lipid panel with LDL of 19.. ?-Admitting physician discussed with Dr. Lorrin Goodell, recommending giving a dose of aspirin initially on presentation and holding Eliquis until repeat head CT was done.   ?-Repeat head CT with no acute abnormalities noted.  ?-Frequent neurochecks ?-PT, OT, speech therapy. ?-Patient assessed by the stroke team who recommended resumption of Eliquis which was resumed on 07/13/2021.  ?-Per neurology. ? ?Atrial fibrillation with RVR ([place]) ?Patient reported compliance with metoprolol.  ?- Infection less likely to be a precipitating factor.   ?-PE less likely given no hypoxia, in addition, he is chronically anticoagulated.   ?-Patient was given a dose of IV metoprolol 2.5 mg in the ED on presentation and rate was remaining in the 120s. ?-Patient noted to have heart rates fluctuating from the low 110s to the 140s at rest. ?-Resumed back home regimen Toprol-XL at 50 mg daily with no significant improvement with heart rates. ?-Patient however asymptomatic however did comment with concerns for probable cerebellar stroke. ?-Continue IV Lopressor as needed. ?-Eliquis resumed per neurology recommendations. ?-Due to elevated heart rates, case discussed with patient's primary cardiologist Dr. Terrence Dupont who stated he had seen patient in the office approximately 2 weeks ago and at that time patient was supposed to be on 125 mg of Toprol-XL with heart rates controlled in the 60s at that time. ?-On initial presentation when discussed with patient and wife it was noted that patient was actually getting 50 mg of Toprol-XL at home per wife. ?-Increase Toprol-XL to  125 mg daily monitor over the next 24 hours and if heart rates not improved will formally consult patient's primary cardiologist Dr. Terrence Dupont. ?-Outpatient follow-up with primary cardiologist, Dr. Terrence Dupont. ?-Cardiac  monitoring. ? ?Stroke [facility]) ?- Patient with concerns for probable cerebellar stroke not noted on imaging. ?-Patient unable to get MRI brain due to metallic object noted in orbit. ?-See above ataxia. ? ?Chronic kidney disease, stage 3b ([place]) ?-Creatinine 1.69, stable. ? ?Essential hypertension ?-Hold antihypertensives at this time to allow permissive hypertension except give low-dose IV metoprolol as needed for heart rate consistently above 120. ?-Patient placed back on Toprol-XL and dose uptitrated to 125 mg daily due to A-fib with RVR. ? ?Dyslipidemia ?-Repeat fasting lipid panel with LDL of 19.   ?-Continue home regimen Lipitor.  ? ?QT prolongation ?-Cardiac monitoring ?-Monitor potassium and magnesium levels ?-Avoid QT prolonging drugs-hold home psychotropic medications at this time and repeat EKG. ? ? ? ? ?  ? ? ?DVT prophylaxis: Eliquis ?Code Status: Full ?Family Communication: Updated patient and wife at bedside. ?Disposition: Likely home with home health pending PT evaluation. ? ?Status is: Inpatient ?Remains inpatient appropriate because: Acute CVA, A-fib with rapid ventricular rate, severity of illness. ?  ?Consultants:  ?Neurology: Dr.Khaliqdina 07/13/2021 ?Curb sided cardiology: Dr. Terrence Dupont 07/14/2021 ? ?Procedures:  ?CT angiogram head and neck 07/13/2021 ?CT head without contrast 07/13/2021, 07/14/2021 ? ? ?Antimicrobials:  ?None ? ? ?Subjective: ?Patient sitting up in bed in room.  Heart rate ranging between 90 to the 140s.  Denies any chest pain.  No shortness of breath.  Ataxia improved from admission.  No family at bedside.   ? ?Objective: ?Vitals:  ? 07/14/21 0415 07/14/21 0834 07/14/21 1209 07/14/21 1308  ?BP: (!) 124/95 (!) 146/103 (!) 133/114   ?Pulse: 97 73 90 (!) 117  ?Resp: '13 18 12   '$ ?Temp: 98 ?F (36.7 ?C) 97.6 ?F (36.4 ?C) 97.6 ?F (36.4 ?C)   ?TempSrc: Oral Oral Oral   ?SpO2: 98% 91% 96%   ?Weight:      ?Height:      ? ? ?Intake/Output Summary (Last 24 hours) at 07/14/2021 1615 ?Last data filed at  07/14/2021 0432 ?Gross per 24 hour  ?Intake 652 ml  ?Output 250 ml  ?Net 402 ml  ? ?Filed Weights  ? 07/13/21 2101  ?Weight: 90.8 kg  ? ? ?Examination: ? ?General exam: Appears calm and comfortable  ?Respiratory system: Clear to auscultation. Respiratory effort normal. ?Cardiovascular system: Irregularly irregular.  No JVD.  No murmurs rubs or gallops.  No lower extremity edema. ?Gastrointestinal system: Abdomen is nondistended, soft and nontender. No organomegaly or masses felt. Normal bowel sounds heard. ?Central nervous system: Alert and oriented.  Left lower extremity ataxia with heel-to-shin.  ?Extremities: Symmetric 5 x 5 power. ?Skin: No rashes, lesions or ulcers ?Psychiatry: Judgement and insight appear normal. Mood & affect appropriate.  ? ? ? ?Data Reviewed:  ? ?CBC: ?Recent Labs  ?Lab 07/13/21 ?1610 07/13/21 ?0426  ?WBC 7.4  --   ?NEUTROABS 3.5  --   ?HGB 14.1 15.3  ?HCT 45.6 45.0  ?MCV 89.4  --   ?PLT 282  --   ? ? ?Basic Metabolic Panel: ?Recent Labs  ?Lab 07/13/21 ?9604 07/13/21 ?5409 07/14/21 ?0236  ?NA 140 143 140  ?K 4.2 4.0 3.9  ?CL 108 107 110  ?CO2 25  --  23  ?GLUCOSE 111* 110* 89  ?BUN '20 23 18  '$ ?CREATININE 1.73* 1.80* 1.69*  ?CALCIUM 9.6  --  8.7*  ?  MG  --   --  2.1  ? ? ?GFR: ?Estimated Creatinine Clearance: 40.2 mL/min (A) (by C-G formula based on SCr of 1.69 mg/dL (H)). ? ?Liver Function Tests: ?Recent Labs  ?Lab 07/13/21 ?4967  ?AST 36  ?ALT 27  ?ALKPHOS 91  ?BILITOT 0.5  ?PROT 7.3  ?ALBUMIN 3.8  ? ? ?CBG: ?Recent Labs  ?Lab 07/13/21 ?0400  ?GLUCAP 116*  ? ? ? ?No results found for this or any previous visit (from the past 240 hour(s)).  ? ? ? ? ? ?Radiology Studies: ?CT HEAD WO CONTRAST (5MM) ? ?Result Date: 07/14/2021 ?CLINICAL DATA:  Stroke follow-up. EXAM: CT HEAD WITHOUT CONTRAST TECHNIQUE: Contiguous axial images were obtained from the base of the skull through the vertex without intravenous contrast. RADIATION DOSE REDUCTION: This exam was performed according to the departmental  dose-optimization program which includes automated exposure control, adjustment of the mA and/or kV according to patient size and/or use of iterative reconstruction technique. COMPARISON:  Head CT yesterday at 4:24 a.m., head CT 11

## 2021-07-14 NOTE — Progress Notes (Signed)
?  Transition of Care (TOC) Screening Note ? ? ?Patient Details  ?Name: George [name]. ?Date of Birth: 1945-07-01 ? ? ?Transition of Care (TOC) CM/SW Contact:    ?Benard Halsted, LCSW ?Phone Number: ?07/14/2021, 9:01 AM ? ? ? ?Transition of Care Department [facility]) has reviewed patient. We will continue to monitor patient advancement through interdisciplinary progression rounds. If new patient transition needs arise, please place a TOC consult. ? ? ?

## 2021-07-14 NOTE — Assessment & Plan Note (Signed)
-   Patient with concerns for probable cerebellar stroke not noted on imaging. ?-Patient unable to get MRI brain due to metallic object noted in orbit. ?-See above ataxia. ?

## 2021-07-14 NOTE — Progress Notes (Addendum)
STROKE TEAM PROGRESS NOTE  ? ?INTERVAL HISTORY ?Patient seen in room with wife at the bedside.  ?Follow up CT head shows no acute infarct.. Continue eliquis  ? ?Vitals:  ? 07/13/21 2101 07/13/21 2330 07/14/21 0415 07/14/21 0834  ?BP: (!) 136/105 (!) 136/101 (!) 124/95 (!) 146/103  ?Pulse: 100 (!) 103 97 73  ?Resp: '19 20 13 18  '$ ?Temp: 97.8 ?F (36.6 ?C) 98 ?F (36.7 ?C) 98 ?F (36.7 ?C) 97.6 ?F (36.4 ?C)  ?TempSrc: Oral Oral Oral Oral  ?SpO2: 97% 91% 98% 91%  ?Weight: 90.8 kg     ?Height: 6' (1.829 m)     ? ?CBC:  ?Recent Labs  ?Lab 07/13/21 ?3154 07/13/21 ?0426  ?WBC 7.4  --   ?NEUTROABS 3.5  --   ?HGB 14.1 15.3  ?HCT 45.6 45.0  ?MCV 89.4  --   ?PLT 282  --   ? ? ?Basic Metabolic Panel:  ?Recent Labs  ?Lab 07/13/21 ?0086 07/13/21 ?7619 07/14/21 ?0236  ?NA 140 143 140  ?K 4.2 4.0 3.9  ?CL 108 107 110  ?CO2 25  --  23  ?GLUCOSE 111* 110* 89  ?BUN '20 23 18  '$ ?CREATININE 1.73* 1.80* 1.69*  ?CALCIUM 9.6  --  8.7*  ?MG  --   --  2.1  ? ? ?Lipid Panel:  ?Recent Labs  ?Lab 07/14/21 ?0236  ?CHOL 111  ?TRIG 295*  ?HDL 33*  ?CHOLHDL 3.4  ?VLDL 59*  ?[place] 19  ? ?HgbA1c: No results for input(s): HGBA1C in the last 168 hours. ?Urine Drug Screen:  ?Recent Labs  ?Lab 07/13/21 ?0542  ?LABOPIA NONE DETECTED  ?COCAINSCRNUR NONE DETECTED  ?LABBENZ NONE DETECTED  ?AMPHETMU NONE DETECTED  ?THCU NONE DETECTED  ?LABBARB NONE DETECTED  ? ?  ?Alcohol Level  ?Recent Labs  ?Lab 07/13/21 ?5093  ?ETH <10  ? ? ? ?IMAGING past 24 hours ?CT HEAD WO CONTRAST (5MM) ? ?Result Date: 07/14/2021 ?CLINICAL DATA:  Stroke follow-up. EXAM: CT HEAD WITHOUT CONTRAST TECHNIQUE: Contiguous axial images were obtained from the base of the skull through the vertex without intravenous contrast. RADIATION DOSE REDUCTION: This exam was performed according to the departmental dose-optimization program which includes automated exposure control, adjustment of the mA and/or kV according to patient size and/or use of iterative reconstruction technique. COMPARISON:  Head CT  yesterday at 4:24 a.m., head CT 02/27/2021. FINDINGS: Brain: Encephalomalacia is again noted due to a cortical infarct in the superior right parietal lobe and a broad-based chronic right occipital lobe infarct. There are chronic bilateral gangliocapsular lacunar infarcts and old lacunar infarct at the right thalamocapsular junction. There is mild atrophy and atrophic ventriculomegaly and mild-to-moderate small vessel disease of the cerebral white matter. There are no findings suspicious for acute cortical based infarct, mass, hemorrhage or midline shift. The cerebellum and brainstem unremarkable aside from mild cerebellar atrophy. Vascular: There are calcifications in the carotid siphons but no hyperdense central vessels. Skull: Normal. Negative for fracture or focal lesion. Sinuses/Orbits: No acute abnormality. S shaped nasal septum. Small metal foreign body in the anterior superior orbit abutting the globe. Other: None. IMPRESSION: Encephalomalacia due to chronic right MCA and PCA territory infarcts, with old lacunar infarcts as well. There is no appreciable acute cortical based infarct or bleed. Small chronic retained metal foreign body in the anterior superior right orbit. Electronically Signed   By: Telford Nab M.D.   On: 07/14/2021 04:18   ? ?PHYSICAL EXAM ? ?Physical Exam  ?Constitutional: Appears well-developed and well-nourished.  Pleasant elderly African-American male ?Cardiovascular: Normal rate and regular rhythm.  ?Respiratory: Effort normal, non-labored breathing ? ?Neuro: ?Mental Status: ?Patient is awake, alert, oriented to person, place, month, year, and situation. ?Patient is able to give a clear and coherent history. ?No signs of aphasia or neglect ?Cranial Nerves: ?II: Pupils are equal, round, and reactive to light.  Left homonymous hemianopia  ?III,IV, VI: EOMI without ptosis or diploplia.  ?V: Facial sensation is symmetric to temperature ?VII: slight facial asymmetry, no apparent weakness  with smiling or raising eyebrows ?VIII: Hearing is intact to voice ?X: Palate elevates symmetrically ?XI: Shoulder shrug is symmetric. ?XII: Tongue protrudes midline without atrophy or fasciculations.  ?Motor: ?Tone is normal. Bulk is normal.  ?RUE 5/5 LUE 5/5 ?RLE 5/5 LLE 4/5 ?Sensory: ?Sensation is symmetric to light touch and temperature in the arms and legs. No extinction to DSS present.  ?Cerebellar: ?Improving LLE ataxia with HKS ? ?ASSESSMENT/PLAN ?[name]. is a 76 y.o. male with history of hypertension, hyperlipidemia, A-fib on Eliquis, history of prior right PCA stroke in November 2022 with some residual vision deficit and mild left-sided weakness, history of colon cancer status post resection, prostate cancer, anxiety, depression, CKD stage IIIb presenting with left side ataxia. Repeat head CT completed with no acute changes noted. Resume eliquis. PT/OT recommend no follow up ? ?Stroke :  Possible cerebellar infarct likely secondary embolic source in the setting of Afib on eliquis given his incoordination ?Code Stroke CT head No acute abnormality. ASPECTS 10.   Chronic retained metallic foreign body in the superior right orbit. encephalomalacia in both the Right MCA and PCA territories since November. ?CTA head & neck Mod irregularity of the Rt PCA 2 (sequelae of Nov infarct) ?Follow up Head CT - Encephalomalacia due to chronic right MCA and PCA territory infarcts, with old lacunar infarcts as well. There is no appreciable acute cortical based infarct or bleed. Small chronic retained metal foreign body in the anterior superior right orbit. ?2D Echo EF 60-65%, bilateral atria normal in size ?LDL 58 ?HgbA1c 5.4 ?VTE prophylaxis - Eliquis ?   ?Diet  ? Diet Heart Room service appropriate? Yes; Fluid consistency: Thin  ? ?Eliquis (apixaban) daily prior to admission, now on Eliquis (apixaban) daily.  ?Therapy recommendations:  No follow up recommended ?Disposition:  pending ? ?Atrial  fibrillation ?Home med: Eliquis ?Rate is not controlled, 130-150s on exam ?Eliquis resumed, PRN metoprolol ordered  ? ?Hypertension ?Home meds:  None ?Stable ?Permissive hypertension (OK if < 220/120) but gradually normalize in 5-7 days ?Long-term BP goal normotensive ? ?Hyperlipidemia ?Home meds:  Atorvastatin '20mg'$ , resumed in hospital ?LDL 58, goal < 70 ?Continue statin at discharge ? ? ?Other Stroke Risk Factors ?Advanced Age >/= 58  ?Hx stroke/TIA ? 02/27/2021- Subacute right PCA infarct, embolic secondary to new onset A. fib.  CTA head/neck showed possible focal occlusion of P2 branch otherwise unremarkable.  Evidence of old right parietal lobe stroke on imaging. Discharged on eliquis ? ?Other Active Problems ?CKD stage 3 ?Cr 1.8 -> 1.69 ?Daily BMPs, avoid nephrotoxic agents ? ?Hospital day # 1 ? ?Patient seen and examined by NP/APP with MD. MD to update note as needed.  ? ?Janine Ores, DNP, FNP-BC ?[facility] ?Pager: [phone] ?I have personally obtained history,examined this patient, reviewed notes, independently viewed imaging studies, participated in medical decision making and plan of care.ROS completed by me personally and pertinent positives fully documented  I have made any additions or clarifications directly to the above  note. Agree with note above.  Repeat CT scan of the head also does not show an acute cortical infarct.  Patient states his dizziness and walking seem to be improving and therapist recommend no follow-up.  Recommend resume Eliquis for his A-fib and discharge patient home.  Long discussion with patient and wife and answered questions.  Discussed with Dr. Grandville Silos.  Greater than 50% time during this 35-minute visit was spent on counseling and coordination of care about his dizziness and atrial fibrillation and anticoagulation and answering questions.  Stroke team will sign off.  Can call for questions ? ?Antony Contras, MD ?Medical Director ?[facility] ?Pager: [phone] ?07/14/2021 2:41 PM ? ? ?To contact Stroke Continuity provider, please refer to [email or web address] ?After hours, contact General Neurology ? ?

## 2021-07-15 ENCOUNTER — Other Ambulatory Visit (HOSPITAL_COMMUNITY): Payer: Self-pay

## 2021-07-15 DIAGNOSIS — G459 Transient cerebral ischemic attack, unspecified: Secondary | ICD-10-CM

## 2021-07-15 DIAGNOSIS — I4891 Unspecified atrial fibrillation: Secondary | ICD-10-CM | POA: Diagnosis not present

## 2021-07-15 DIAGNOSIS — R270 Ataxia, unspecified: Secondary | ICD-10-CM | POA: Diagnosis not present

## 2021-07-15 DIAGNOSIS — I10 Essential (primary) hypertension: Secondary | ICD-10-CM | POA: Diagnosis not present

## 2021-07-15 DIAGNOSIS — N289 Disorder of kidney and ureter, unspecified: Secondary | ICD-10-CM | POA: Diagnosis not present

## 2021-07-15 LAB — CBC
HCT: 42.5 % (ref 39.0–52.0)
Hemoglobin: 13.3 g/dL (ref 13.0–17.0)
MCH: 27.8 pg (ref 26.0–34.0)
MCHC: 31.3 g/dL (ref 30.0–36.0)
MCV: 88.7 fL (ref 80.0–100.0)
Platelets: 263 10*3/uL (ref 150–400)
RBC: 4.79 MIL/uL (ref 4.22–5.81)
RDW: 13.8 % (ref 11.5–15.5)
WBC: 7.9 10*3/uL (ref 4.0–10.5)
nRBC: 0 % (ref 0.0–0.2)

## 2021-07-15 LAB — BASIC METABOLIC PANEL
Anion gap: 7 (ref 5–15)
BUN: 21 mg/dL (ref 8–23)
CO2: 22 mmol/L (ref 22–32)
Calcium: 8.8 mg/dL — ABNORMAL LOW (ref 8.9–10.3)
Chloride: 110 mmol/L (ref 98–111)
Creatinine, Ser: 1.81 mg/dL — ABNORMAL HIGH (ref 0.61–1.24)
GFR, Estimated: 38 mL/min — ABNORMAL LOW (ref >60)
Glucose, Bld: 87 mg/dL (ref 70–99)
Potassium: 4 mmol/L (ref 3.5–5.1)
Sodium: 139 mmol/L (ref 135–145)

## 2021-07-15 LAB — MAGNESIUM: Magnesium: 2 mg/dL (ref 1.7–2.4)

## 2021-07-15 LAB — HEMOGLOBIN A1C
Hgb A1c MFr Bld: 5.6 % (ref 4.8–5.6)
Mean Plasma Glucose: 114 mg/dL

## 2021-07-15 NOTE — Discharge Summary (Addendum)
Physician Discharge Summary  ?George [name]. WLS:[id number] DOB: [birth year] DOA: 07/13/2021 ? ?PCP: Allwardt, Randa Evens, PA-C ? ?Admit date: 07/13/2021 ?Discharge date: 07/15/2021 ? ?Admitted From: Home ?Disposition:  Home  ? ?Recommendations for Outpatient Follow-up:  ?Follow up with PCP in 1-2 weeks ?Please obtain BMP/CBC in one week ? ?Home Health:NO ? ? ?Discharge Condition:Stable ?CODE STATUS:FULL ?Diet recommendation: Heart Healthy  ? ?Brief/Interim Summary: ? ?Patient 76 year old gentleman history of hypertension, hyperlipidemia, A-fib on Eliquis, prior history of right PCA stroke November 2022 with some residual visual deficits and mild left-sided weakness, history of colon cancer status postresection, prostate cancer, depression/anxiety, CKD stage IIIb presented to the ED with left-sided ataxia.  CT head done negative for any acute abnormalities.  CT angiogram head and neck done with no LVO.  Patient seen in consultation by neurology and symptoms concerning for left cerebellar stroke not noted on imaging.  Patient unable to get MRI of the brain due to metallic object in orbit.  Patient initially placed on aspirin.  Patient seen in consultation for neurology patient admitted for stroke. ? ?TIA/acute CVA ruled out ?-Patient presents with left-sided weakness/ataxia, this has totally resolved ?-CT head was obtained x2, with no evidence of acute CVA. ?-Follow up Head CT - Encephalomalacia due to chronic right MCA and PCA territory infarcts, with old lacunar infarcts as well. There is no appreciable acute cortical based infarct or bleed. Small chronic retained metal foreign body in the anterior superior right orbit ?-Unable to obtain MRI brain due to retained metallic foreign body in the superior right orbit ?- 2D Echo EF 60-65%, bilateral atria normal in size ?- LDL 58 ?- HgbA1c 5.4 ?-Neurology input greatly appreciated, repeat CT head does not show an acute cortical infarct. ? ?Atrial fibrillation with RVR ?-He was  resumed on Eliquis ?-Toprol-XL was initially held for permissive hypertension, patient developed A-fib with RVR, for which she required IV fluids, and IV metoprolol pushes, and his medication has been uptitrated to home dose of 125 mg XL daily, heart rate is currently controlled, continue with current regimen. ? ?Chronic kidney disease, stage 3b ([place]) ?-Creatinine 1.8, stable. ?  ?Essential hypertension ?-Hold antihypertensives at this time to allow permissive hypertension except give low-dose IV metoprolol as needed for heart rate consistently above 120. ?-Resume home regimen Toprol XL due to A-fib with RVR. ?  ?Dyslipidemia ?-Continue Lipitor.   ? ? ?  ? ?Discharge Diagnoses:  ?Principal Problem: ?  TIA (transient ischemic attack) ?Active Problems: ?  Atrial fibrillation with RVR ([place]) ?  Ataxia ?  QT prolongation ?  Dyslipidemia ?  Essential hypertension ?  Chronic kidney disease, stage 3b ([place]) ? ? ? ?Discharge Instructions ? ?Discharge Instructions   ? ? Ambulatory referral to Neurology   Complete by: As directed ?  ? An appointment is requested in approximately: 8 weeks  ? Diet - low sodium heart healthy   Complete by: As directed ?  ? Diet - low sodium heart healthy   Complete by: As directed ?  ? Increase activity slowly   Complete by: As directed ?  ? Increase activity slowly   Complete by: As directed ?  ? ?  ? ?Allergies as of 07/15/2021   ? ?   Reactions  ? Amlodipine Other (See Comments)  ? Dizziness   ? Gabapentin   ? hallucinations  ? ?  ? ?  ?Medication List  ?  ? ?STOP taking these medications   ? ?LORazepam 0.5 MG tablet ?Commonly  known as: Ativan ?  ? ?  ? ?TAKE these medications   ? ?acetaminophen 500 MG tablet ?Commonly known as: TYLENOL ?Take 500 mg by mouth every 6 (six) hours as needed for mild pain or headache. ?  ?apixaban 5 MG Tabs tablet ?Commonly known as: Eliquis ?Take 1 tablet (5 mg total) by mouth 2 (two) times daily. ?  ?atorvastatin 20 MG tablet ?Commonly known as: LIPITOR ?Take 20 mg  by mouth daily. ?  ?buPROPion 150 MG 24 hr tablet ?Commonly known as: WELLBUTRIN XL ?Take 450 mg by mouth every morning. ?  ?busPIRone 10 MG tablet ?Commonly known as: BUSPAR ?Take 10 mg by mouth 2 (two) times daily. ?  ?cyanocobalamin 1000 MCG tablet ?Take 1 tablet (1,000 mcg total) by mouth daily. ?  ?escitalopram 5 MG tablet ?Commonly known as: Lexapro ?Take 1 tablet (5 mg total) by mouth daily. ?What changed: how much to take ?  ?felodipine 10 MG 24 hr tablet ?Commonly known as: PLENDIL ?Take 1 tablet (10 mg total) by mouth daily. ?What changed: how much to take ?  ?hydrOXYzine 10 MG tablet ?Commonly known as: ATARAX ?TAKE 1 TABLET BY MOUTH EVERY 6 HOURS AS NEEDED FOR ANXIETY OR  ITCHING ?What changed:  ?how much to take ?how to take this ?when to take this ?reasons to take this ?additional instructions ?  ?losartan 100 MG tablet ?Commonly known as: COZAAR ?Take 100 mg by mouth daily. ?  ?metoprolol succinate 50 MG 24 hr tablet ?Commonly known as: Toprol XL ?Take 2 tablets (100 mg total) by mouth daily. Take with or immediately following a meal. ?What changed: how much to take ?  ?metoprolol succinate 25 MG 24 hr tablet ?Commonly known as: TOPROL-XL ?Take 5 tablets (125 mg total) by mouth daily. ?What changed: You were already taking a medication with the same name, and this prescription was added. Make sure you understand how and when to take each. ?  ?multivitamin with minerals Tabs tablet ?Take 1 tablet by mouth daily. ?  ?OCEAN NASAL SPRAY NA ?Place 1 spray into both nostrils as needed (congestion). ?  ?pregabalin 25 MG capsule ?Commonly known as: LYRICA ?Take 1 capsule (25 mg total) by mouth 2 (two) times daily. ?  ?spironolactone 25 MG tablet ?Commonly known as: ALDACTONE ?Take 1 tablet by mouth once daily ?  ?Vitamin D (Ergocalciferol) 1.25 MG (50000 UNIT) Caps capsule ?Commonly known as: DRISDOL ?TAKE 1 CAPSULE BY MOUTH ONCE A WEEK ON FRIDAYS ?What changed: See the new instructions. ?  ? ?  ? ? Follow-up  Information   ? ? Allwardt, Alyssa M, PA-C. Schedule an appointment as soon as possible for a visit in 2 week(s).   ?Specialty: Physician Assistant ?Contact information: ?[address] ?[place] [address] ?[phone] ? ? ?  ?  ? ? Charolette Forward, MD. Schedule an appointment as soon as possible for a visit in 1 week(s).   ?Specialty: Cardiology ?Contact information: ?[address] ?[address] ?[place] [address] ?[phone] ? ? ?  ?  ? ?  ?  ? ?  ? ?Allergies  ?Allergen Reactions  ? Amlodipine Other (See Comments)  ?  Dizziness   ? Gabapentin   ?  hallucinations  ? ? ?Consultations: ?Neurology ? ? ?Procedures/Studies: ?CT HEAD WO CONTRAST (5MM) ? ?Result Date: 07/14/2021 ?CLINICAL DATA:  Stroke follow-up. EXAM: CT HEAD WITHOUT CONTRAST TECHNIQUE: Contiguous axial images were obtained from the base of the skull through the vertex without intravenous contrast. RADIATION  DOSE REDUCTION: This exam was performed according to the departmental dose-optimization program which includes automated exposure control, adjustment of the mA and/or kV according to patient size and/or use of iterative reconstruction technique. COMPARISON:  Head CT yesterday at 4:24 a.m., head CT 02/27/2021. FINDINGS: Brain: Encephalomalacia is again noted due to a cortical infarct in the superior right parietal lobe and a broad-based chronic right occipital lobe infarct. There are chronic bilateral gangliocapsular lacunar infarcts and old lacunar infarct at the right thalamocapsular junction. There is mild atrophy and atrophic ventriculomegaly and mild-to-moderate small vessel disease of the cerebral white matter. There are no findings suspicious for acute cortical based infarct, mass, hemorrhage or midline shift. The cerebellum and brainstem unremarkable aside from mild cerebellar atrophy. Vascular: There are calcifications in the carotid siphons but no hyperdense central vessels. Skull: Normal. Negative for fracture or focal  lesion. Sinuses/Orbits: No acute abnormality. S shaped nasal septum. Small metal foreign body in the anterior superior orbit abutting the globe. Other: None. IMPRESSION: Encephalomalacia due to chronic rig

## 2021-07-15 NOTE — Discharge Instructions (Signed)
Follow with Primary MD Allwardt, Randa Evens, PA-C in 7 days  ? ?Get CBC, CMP, checked  by Primary MD next visit.  ? ? ?Activity: As tolerated with Full fall precautions use walker/cane & assistance as needed ? ? ?Disposition Home  ? ? ?Diet: Heart Healthy   ? ? ?On your next visit with your primary care physician please Get Medicines reviewed and adjusted. ? ? ?Please request your Prim.MD to go over all Hospital Tests and Procedure/Radiological results at the follow up, please get all Hospital records sent to your Prim MD by signing hospital release before you go home. ? ? ?If you experience worsening of your admission symptoms, develop shortness of breath, life threatening emergency, suicidal or homicidal thoughts you must seek medical attention immediately by calling 911 or calling your MD immediately  if symptoms less severe. ? ?You Must read complete instructions/literature along with all the possible adverse reactions/side effects for all the Medicines you take and that have been prescribed to you. Take any new Medicines after you have completely understood and accpet all the possible adverse reactions/side effects.  ? ?Do not drive, operating heavy machinery, perform activities at heights, swimming or participation in water activities or provide baby sitting services if your were admitted for syncope or siezures until you have seen by Primary MD or a Neurologist and advised to do so again. ? ?Do not drive when taking Pain medications.  ? ? ?Do not take more than prescribed Pain, Sleep and Anxiety Medications ? ?Special Instructions: If you have smoked or chewed Tobacco  in the last 2 yrs please stop smoking, stop any regular Alcohol  and or any Recreational drug use. ? ?Wear Seat belts while driving. ? ? ?Please note ? ?You were cared for by a hospitalist during your hospital stay. If you have any questions about your discharge medications or the care you received while you were in the hospital after you are  discharged, you can call the unit and asked to speak with the hospitalist on call if the hospitalist that took care of you is not available. Once you are discharged, your primary care physician will handle any further medical issues. Please note that NO REFILLS for any discharge medications will be authorized once you are discharged, as it is imperative that you return to your primary care physician (or establish a relationship with a primary care physician if you do not have one) for your aftercare needs so that they can reassess your need for medications and monitor your lab values.  ?

## 2021-07-16 ENCOUNTER — Telehealth: Payer: Self-pay

## 2021-07-16 NOTE — Telephone Encounter (Signed)
Transition Care Management Unsuccessful Follow-up Telephone Call ? ?Date of discharge and from where:  4//5/23 ? ?Attempts:  2nd Attempt ? ?Reason for unsuccessful TCM follow-up call:  Left voice message ? ? ? ?

## 2021-07-20 ENCOUNTER — Ambulatory Visit (INDEPENDENT_AMBULATORY_CARE_PROVIDER_SITE_OTHER): Payer: Medicare Other | Admitting: Physician Assistant

## 2021-07-20 ENCOUNTER — Telehealth: Payer: Self-pay

## 2021-07-20 ENCOUNTER — Encounter: Payer: Self-pay | Admitting: Physician Assistant

## 2021-07-20 VITALS — BP 120/74 | HR 54 | Temp 98.5°F | Ht 72.0 in | Wt 207.0 lb

## 2021-07-20 DIAGNOSIS — F411 Generalized anxiety disorder: Secondary | ICD-10-CM | POA: Diagnosis not present

## 2021-07-20 DIAGNOSIS — Z8673 Personal history of transient ischemic attack (TIA), and cerebral infarction without residual deficits: Secondary | ICD-10-CM

## 2021-07-20 DIAGNOSIS — F410 Panic disorder [episodic paroxysmal anxiety] without agoraphobia: Secondary | ICD-10-CM | POA: Diagnosis not present

## 2021-07-20 DIAGNOSIS — Z20822 Contact with and (suspected) exposure to covid-19: Secondary | ICD-10-CM | POA: Diagnosis not present

## 2021-07-20 DIAGNOSIS — I4891 Unspecified atrial fibrillation: Secondary | ICD-10-CM

## 2021-07-20 DIAGNOSIS — G459 Transient cerebral ischemic attack, unspecified: Secondary | ICD-10-CM

## 2021-07-20 MED ORDER — BUSPIRONE HCL 10 MG PO TABS: 10.0000 mg | ORAL_TABLET | Freq: Two times a day (BID) | ORAL | 1 refills | Status: AC

## 2021-07-20 MED ORDER — METOPROLOL SUCCINATE ER 25 MG PO TB24: 125.0000 mg | ORAL_TABLET | Freq: Every day | ORAL | 0 refills | Status: DC

## 2021-07-20 NOTE — Progress Notes (Signed)
? ? ?Chief Complaint:  ?George [name]. is a 76 y.o. male who presents today for a TCM visit. ? ? ?Subjective:  ?HPI: ? ?Patient is in today for hospital f/up. Here with his wife today.  ?Admitted to [facility] 07/13/21, discharged 07/15/21.  ?Principal problem - TIA, CVA ruled out ?Active problems - A-fib with RVR, Ataxia, QT prolongation, dyslipidemia, essential HTN, CKD stage 3b ? ?Supposed to be scheduled to see cardiology in 1 week and neurology in 8 weeks.  ? ?Hospital summary: ?States he went to bed feeling ok. Got up on 07-13-21 to go to the restroom and fell backwards on the bed. Bumped into everything on the way to the bathroom. Told wife he thought he was having another stroke and they went to the hospital. He was having L sided weakness and ataxia. CVA was ruled out, thought to have had TIA. Also found to be in A-fib with RVR, admitted for two days. Discharged in stable condition after weakness completely resolved and rate controlled again.  ? ?Interim History: ?-Feeling "like himself again." ?-Says he feels strong. No longer having any weakness.  ?-He was taken off Lexapro. No longer having hallucinations and feels like himself again he says. Now taking Buspar 10 mg BID and hydroxyzine 10 mg every 6 hours. Still taking Wellbutrin XL 450 mg every morning.  ? ?ROS: A complete review of systems was negative.  ? ?PMH: ? ?The following were reviewed and entered/updated in epic: ?Past Medical History:  ?Diagnosis Date  ? Anxiety   ? Arthritis   ? OSTEO IN KNEE  ? Asthma   ? as child  ? Colon cancer ([place])   ? Depression   ? Elevated prostate specific antigen (PSA)   ? Generalized anxiety disorder 04/09/2015  ? Hematospermia   ? Hx antineoplastic chemotherapy 2010  ? Hypertension   ? Idiopathic peripheral neuropathy   ? TOES OF BOTH FEET DUE TO CHEMO  ? Incomplete bladder emptying   ? Malignant neoplasm of prostate ([place])   ? Nodular prostate with lower urinary tract symptoms   ? Panic attacks 04/09/2015  ? Primary  localized osteoarthrosis of the knee, right 05/19/2016  ? Prostatitis   ? S/P total knee arthroplasty, left 05/19/2016  ? Spermatocele   ? Weak urinary stream   ? ?Patient Active Problem List  ? Diagnosis Date Noted  ? TIA (transient ischemic attack) 07/15/2021  ? Ataxia 07/13/2021  ? Chronic kidney disease, stage 3b ([place]) 07/13/2021  ? AKI (acute kidney injury) ([place])   ? Dyslipidemia   ? Essential hypertension   ? Hemiparesis affecting left side as late effect of stroke ([place])   ? Subcortical infarction ([place]) 03/05/2021  ? Recurrent major depressive disorder, in partial remission ([place])   ? CVA (cerebral vascular accident) ([place]) 02/27/2021  ? Atrial fibrillation with RVR ([place]) 02/27/2021  ? Hypokalemia 02/27/2021  ? QT prolongation 02/27/2021  ? Epistaxis, recurrent 07/04/2019  ? Polyneuropathy 01/31/2018  ? Chemotherapy-induced neuropathy ([place]) 01/31/2018  ? B12 deficiency 01/31/2018  ? Prediabetes 01/31/2018  ? Basal ganglia infarction ([place]) 01/31/2018  ? SBO (small bowel obstruction) ([place]) 10/20/2017  ? Deviated septum 06/28/2017  ? Nasal turbinate hypertrophy 06/28/2017  ? Rhinitis, chronic 06/28/2017  ? Primary localized osteoarthritis of right knee 05/31/2016  ? Primary localized osteoarthrosis of the knee, right 05/19/2016  ? S/P total knee arthroplasty, left 05/19/2016  ? Dyspnea 01/20/2016  ? DJD (degenerative joint disease) of knee 04/21/2015  ? Primary localized osteoarthritis of left  knee 04/09/2015  ? Generalized anxiety disorder 04/09/2015  ? Panic attacks 04/09/2015  ? Spermatocele 05/08/2014  ? Benign localized hyperplasia of prostate with urinary obstruction 05/07/2014  ? Malignant neoplasm of prostate ([place]) 04/22/2014  ? ?Past Surgical History:  ?Procedure Laterality Date  ? COLON RESECTION   NOV 2009  ? COLONOSCOPY    ? PROSTATE BIOPSY    ? SPERMATOCELECTOMY Left 05/07/2014  ? Procedure: LEFT SPERMATOCELECTOMY;  Surgeon: Malka So, MD;  Location: [facility];  Service: Urology;  Laterality: Left;  ?  TONSILLECTOMY    ? TOTAL KNEE ARTHROPLASTY Left 04/21/2015  ? Procedure: LEFT TOTAL KNEE ARTHROPLASTY;  Surgeon: Elsie Saas, MD;  Location: [facility];  Service: Orthopedics;  Laterality: Left;  ? TOTAL KNEE ARTHROPLASTY Right 05/31/2016  ? Procedure: TOTAL KNEE ARTHROPLASTY;  Surgeon: Elsie Saas, MD;  Location: [facility];  Service: Orthopedics;  Laterality: Right;  ? TRANSURETHRAL RESECTION OF PROSTATE N/A 05/07/2014  ? Procedure: TRANSURETHRAL RESECTION OF THE PROSTATE WITH GYRUS INSTRUMENTS;  Surgeon: Malka So, MD;  Location: [facility];  Service: Urology;  Laterality: N/A;  ? ? ?Family History  ?Problem Relation Age of Onset  ? Stroke Father   ?     age 54  ? Cancer Brother   ?     neoplasm of brain  ? ? ?Medications- Reconciled discharge and current medications in Epic.  ?Current Outpatient Medications  ?Medication Sig Dispense Refill  ? acetaminophen (TYLENOL) 500 MG tablet Take 500 mg by mouth every 6 (six) hours as needed for mild pain or headache.    ? apixaban (ELIQUIS) 5 MG TABS tablet Take 1 tablet (5 mg total) by mouth 2 (two) times daily. 60 tablet 0  ? atorvastatin (LIPITOR) 20 MG tablet Take 20 mg by mouth daily.    ? buPROPion (WELLBUTRIN XL) 150 MG 24 hr tablet Take 450 mg by mouth every morning.     ? felodipine (PLENDIL) 10 MG 24 hr tablet Take 1 tablet (10 mg total) by mouth daily. (Patient taking differently: Take 5 mg by mouth daily.)    ? hydrOXYzine (ATARAX) 10 MG tablet TAKE 1 TABLET BY MOUTH EVERY 6 HOURS AS NEEDED FOR ANXIETY OR  ITCHING (Patient taking differently: Take 10 mg by mouth every 6 (six) hours as needed for anxiety or itching.) 90 tablet 0  ? losartan (COZAAR) 100 MG tablet Take 100 mg by mouth daily.    ? Multiple Vitamin (MULTIVITAMIN WITH MINERALS) TABS tablet Take 1 tablet by mouth daily.    ? pregabalin (LYRICA) 25 MG capsule Take 1 capsule (25 mg total) by mouth 2 (two) times daily. 60 capsule 0  ? Saline (OCEAN NASAL SPRAY NA) Place 1 spray into both nostrils as needed  (congestion).    ? spironolactone (ALDACTONE) 25 MG tablet Take 1 tablet by mouth once daily (Patient taking differently: Take 25 mg by mouth daily.) 30 tablet 0  ? vitamin B-12 1000 MCG tablet Take 1 tablet (1,000 mcg total) by mouth daily.    ? Vitamin D, Ergocalciferol, (DRISDOL) 1.25 MG (50000 UNIT) CAPS capsule TAKE 1 CAPSULE BY MOUTH ONCE A WEEK ON FRIDAYS (Patient taking differently: Take 50,000 Units by mouth every Friday.) 5 capsule 0  ? busPIRone (BUSPAR) 10 MG tablet Take 1 tablet (10 mg total) by mouth 2 (two) times daily. 180 tablet 1  ? escitalopram (LEXAPRO) 5 MG tablet Take 1 tablet (5 mg total) by mouth daily. (Patient not taking: Reported on 07/20/2021) 30 tablet 3  ?  metoprolol succinate (TOPROL XL) 50 MG 24 hr tablet Take 2 tablets (100 mg total) by mouth daily. Take with or immediately following a meal. (Patient not taking: Reported on 07/20/2021) 60 tablet 1  ? metoprolol succinate (TOPROL-XL) 25 MG 24 hr tablet Take 5 tablets (125 mg total) by mouth daily. 150 tablet 0  ? ?No current facility-administered medications for this visit.  ? ? ?Allergies-reviewed and updated ?Allergies  ?Allergen Reactions  ? Amlodipine Other (See Comments)  ?  Dizziness   ? Gabapentin   ?  hallucinations  ? ? ?Social History  ? ?Socioeconomic History  ? Marital status: Married  ?  Spouse name: Not on file  ? Number of children: 9  ? Years of education: Not on file  ? Highest education level: Not on file  ?Occupational History  ? Occupation: retire  ?Tobacco Use  ? Smoking status: Former  ?  Packs/day: 0.25  ?  Years: 3.00  ?  Pack years: 0.75  ?  Types: Cigarettes  ?  Quit date: 04/12/1972  ?  Years since quitting: 49.3  ? Smokeless tobacco: Never  ? Tobacco comments:  ?  smoked 1 cigarette per day SOME DAYS  ?Vaping Use  ? Vaping Use: Never used  ?Substance and Sexual Activity  ? Alcohol use: Yes  ?  Alcohol/week: 1.0 standard drink  ?  Types: 1 Cans of beer per week  ?  Comment: OCCASIONAL  ? Drug use: No  ? Sexual  activity: Yes  ?Other Topics Concern  ? Not on file  ?Social History Narrative  ? Shawnie Dapper will take care of him after his surgery    Her cell is [phone]  ? ?Social Determinants of Health  ? ?Financial

## 2021-07-20 NOTE — Telephone Encounter (Signed)
[facility] called and states the want to follow up on the medication that was just sent in for the patient. Call back number is [phone]. ?

## 2021-07-21 ENCOUNTER — Ambulatory Visit: Payer: Medicare Other | Admitting: Physician Assistant

## 2021-07-21 ENCOUNTER — Other Ambulatory Visit: Payer: Self-pay | Admitting: Physical Medicine and Rehabilitation

## 2021-07-22 NOTE — Telephone Encounter (Signed)
Spoke with pharmacy and sent pt a MyChart message to let him know that he will be able to filled Buspar on 07/26/2021. It was last filled o n 06/25/2021 for 30 days. ?

## 2021-07-26 ENCOUNTER — Other Ambulatory Visit: Payer: Self-pay | Admitting: Physician Assistant

## 2021-07-27 DIAGNOSIS — E785 Hyperlipidemia, unspecified: Secondary | ICD-10-CM | POA: Diagnosis not present

## 2021-07-27 DIAGNOSIS — I10 Essential (primary) hypertension: Secondary | ICD-10-CM | POA: Diagnosis not present

## 2021-07-27 DIAGNOSIS — I6340 Cerebral infarction due to embolism of unspecified cerebral artery: Secondary | ICD-10-CM | POA: Diagnosis not present

## 2021-07-27 DIAGNOSIS — I482 Chronic atrial fibrillation, unspecified: Secondary | ICD-10-CM | POA: Diagnosis not present

## 2021-07-27 MED ORDER — HYDROXYZINE HCL 10 MG PO TABS: ORAL_TABLET | ORAL | 0 refills | Status: DC

## 2021-07-30 ENCOUNTER — Other Ambulatory Visit: Payer: Self-pay | Admitting: Physician Assistant

## 2021-07-31 ENCOUNTER — Ambulatory Visit: Payer: Medicare Other | Admitting: Physician Assistant

## 2021-08-03 ENCOUNTER — Other Ambulatory Visit: Payer: Self-pay | Admitting: Physician Assistant

## 2021-08-03 ENCOUNTER — Other Ambulatory Visit: Payer: Self-pay | Admitting: Physical Medicine and Rehabilitation

## 2021-08-03 NOTE — Telephone Encounter (Signed)
Last refill: 07/03/21 #60, 0 ?Last OV: 07/20/21 dx. TIA ?

## 2021-08-16 ENCOUNTER — Other Ambulatory Visit: Payer: Self-pay | Admitting: Physician Assistant

## 2021-08-18 ENCOUNTER — Other Ambulatory Visit: Payer: Self-pay | Admitting: Physician Assistant

## 2021-08-18 ENCOUNTER — Encounter: Payer: Self-pay | Admitting: Physician Assistant

## 2021-08-18 ENCOUNTER — Telehealth: Payer: Self-pay

## 2021-08-18 ENCOUNTER — Ambulatory Visit (INDEPENDENT_AMBULATORY_CARE_PROVIDER_SITE_OTHER): Payer: Medicare Other

## 2021-08-18 DIAGNOSIS — Z0000 Encounter for general adult medical examination without abnormal findings: Secondary | ICD-10-CM | POA: Diagnosis not present

## 2021-08-18 NOTE — Patient Instructions (Signed)
Mr. Schroader , ?Thank you for taking time to come for your Medicare Wellness Visit. I appreciate your ongoing commitment to your health goals. Please review the following plan we discussed and let me know if I can assist you in the future.  ? ?Screening recommendations/referrals: ?Colonoscopy: done 04/02/16 repeat as directed ?Recommended yearly ophthalmology/optometry visit for glaucoma screening and checkup ?Recommended yearly dental visit for hygiene and checkup ? ?Vaccinations: ?Influenza vaccine: done 02/25/21 repeat every year ?Pneumococcal vaccine: declined and discussed ?Tdap vaccine: due ?Shingles vaccine: 1/11, 06/10/21   ?Covid-19: completed 1/25, 2/15, 02/16/20 ? ?Advanced directives: Please bring a copy of your health care power of attorney and living will to the office at your convenience. ? ?Conditions/risks identified: get back to better health ? ?Next appointment: Follow up in one year for your annual wellness visit.  ? ?Preventive Care 21 Years and Older, Male ?Preventive care refers to lifestyle choices and visits with your health care provider that can promote health and wellness. ?What does preventive care include? ?A yearly physical exam. This is also called an annual well check. ?Dental exams once or twice a year. ?Routine eye exams. Ask your health care provider how often you should have your eyes checked. ?Personal lifestyle choices, including: ?Daily care of your teeth and gums. ?Regular physical activity. ?Eating a healthy diet. ?Avoiding tobacco and drug use. ?Limiting alcohol use. ?Practicing safe sex. ?Taking low doses of aspirin every day. ?Taking vitamin and mineral supplements as recommended by your health care provider. ?What happens during an annual well check? ?The services and screenings done by your health care provider during your annual well check will depend on your age, overall health, lifestyle risk factors, and family history of disease. ?Counseling  ?Your health care provider  may ask you questions about your: ?Alcohol use. ?Tobacco use. ?Drug use. ?Emotional well-being. ?Home and relationship well-being. ?Sexual activity. ?Eating habits. ?History of falls. ?Memory and ability to understand (cognition). ?Work and work Statistician. ?Screening  ?You may have the following tests or measurements: ?Height, weight, and BMI. ?Blood pressure. ?Lipid and cholesterol levels. These may be checked every 5 years, or more frequently if you are over 76 years old. ?Skin check. ?Lung cancer screening. You may have this screening every year starting at age 68 if you have a 30-pack-year history of smoking and currently smoke or have quit within the past 15 years. ?Fecal occult blood test (FOBT) of the stool. You may have this test every year starting at age 37. ?Flexible sigmoidoscopy or colonoscopy. You may have a sigmoidoscopy every 5 years or a colonoscopy every 10 years starting at age 73. ?Prostate cancer screening. Recommendations will vary depending on your family history and other risks. ?Hepatitis C blood test. ?Hepatitis B blood test. ?Sexually transmitted disease (STD) testing. ?Diabetes screening. This is done by checking your blood sugar (glucose) after you have not eaten for a while (fasting). You may have this done every 1-3 years. ?Abdominal aortic aneurysm (AAA) screening. You may need this if you are a current or former smoker. ?Osteoporosis. You may be screened starting at age 52 if you are at high risk. ?Talk with your health care provider about your test results, treatment options, and if necessary, the need for more tests. ?Vaccines  ?Your health care provider may recommend certain vaccines, such as: ?Influenza vaccine. This is recommended every year. ?Tetanus, diphtheria, and acellular pertussis (Tdap, Td) vaccine. You may need a Td booster every 10 years. ?Zoster vaccine. You may need this after  age 1. ?Pneumococcal 13-valent conjugate (PCV13) vaccine. One dose is recommended after  age 33. ?Pneumococcal polysaccharide (PPSV23) vaccine. One dose is recommended after age 34. ?Talk to your health care provider about which screenings and vaccines you need and how often you need them. ?This information is not intended to replace advice given to you by your health care provider. Make sure you discuss any questions you have with your health care provider. ?Document Released: 04/25/2015 Document Revised: 12/17/2015 Document Reviewed: 01/28/2015 ?Elsevier Interactive Patient Education ? 2017 [employer]. ? ?Fall Prevention in the Home ?Falls can cause injuries. They can happen to people of all ages. There are many things you can do to make your home safe and to help prevent falls. ?What can I do on the outside of my home? ?Regularly fix the edges of walkways and driveways and fix any cracks. ?Remove anything that might make you trip as you walk through a door, such as a raised step or threshold. ?Trim any bushes or trees on the path to your home. ?Use bright outdoor lighting. ?Clear any walking paths of anything that might make someone trip, such as rocks or tools. ?Regularly check to see if handrails are loose or broken. Make sure that both sides of any steps have handrails. ?Any raised decks and porches should have guardrails on the edges. ?Have any leaves, snow, or ice cleared regularly. ?Use sand or salt on walking paths during winter. ?Clean up any spills in your garage right away. This includes oil or grease spills. ?What can I do in the bathroom? ?Use night lights. ?Install grab bars by the toilet and in the tub and shower. Do not use towel bars as grab bars. ?Use non-skid mats or decals in the tub or shower. ?If you need to sit down in the shower, use a plastic, non-slip stool. ?Keep the floor dry. Clean up any water that spills on the floor as soon as it happens. ?Remove soap buildup in the tub or shower regularly. ?Attach bath mats securely with double-sided non-slip rug tape. ?Do not have  throw rugs and other things on the floor that can make you trip. ?What can I do in the bedroom? ?Use night lights. ?Make sure that you have a light by your bed that is easy to reach. ?Do not use any sheets or blankets that are too big for your bed. They should not hang down onto the floor. ?Have a firm chair that has side arms. You can use this for support while you get dressed. ?Do not have throw rugs and other things on the floor that can make you trip. ?What can I do in the kitchen? ?Clean up any spills right away. ?Avoid walking on wet floors. ?Keep items that you use a lot in easy-to-reach places. ?If you need to reach something above you, use a strong step stool that has a grab bar. ?Keep electrical cords out of the way. ?Do not use floor polish or wax that makes floors slippery. If you must use wax, use non-skid floor wax. ?Do not have throw rugs and other things on the floor that can make you trip. ?What can I do with my stairs? ?Do not leave any items on the stairs. ?Make sure that there are handrails on both sides of the stairs and use them. Fix handrails that are broken or loose. Make sure that handrails are as long as the stairways. ?Check any carpeting to make sure that it is firmly attached to the stairs.  Fix any carpet that is loose or worn. ?Avoid having throw rugs at the top or bottom of the stairs. If you do have throw rugs, attach them to the floor with carpet tape. ?Make sure that you have a light switch at the top of the stairs and the bottom of the stairs. If you do not have them, ask someone to add them for you. ?What else can I do to help prevent falls? ?Wear shoes that: ?Do not have high heels. ?Have rubber bottoms. ?Are comfortable and fit you well. ?Are closed at the toe. Do not wear sandals. ?If you use a stepladder: ?Make sure that it is fully opened. Do not climb a closed stepladder. ?Make sure that both sides of the stepladder are locked into place. ?Ask someone to hold it for you, if  possible. ?Clearly mark and make sure that you can see: ?Any grab bars or handrails. ?First and last steps. ?Where the edge of each step is. ?Use tools that help you move around (mobility aids) if they a

## 2021-08-18 NOTE — Progress Notes (Addendum)
Virtual Visit via Telephone Note ? ?I connected with  George [name]. on 08/18/21 at 10:15 AM EDT by telephone and verified that I am speaking with the correct person using two identifiers. ? ?Medicare Annual Wellness visit completed telephonically due to Covid-19 pandemic.  ? ?Persons participating in this call: This Health Coach and this patient.  ? ?Location: ?Patient: home ?Provider: office ?  ?I discussed the limitations, risks, security and privacy concerns of performing an evaluation and management service by telephone and the availability of in person appointments. The patient expressed understanding and agreed to proceed. ? ?Unable to perform video visit due to video visit attempted and failed and/or patient does not have video capability.  ? ?Some vital signs may be absent or patient reported.  ? ?Willette Brace, LPN ? ? ?Subjective:  ? George Manna. is a 76 y.o. male who presents for an Initial Medicare Annual Wellness Visit. ? ?Review of Systems    ? ?Cardiac Risk Factors include: advanced age (>50mn, >>35women);dyslipidemia;male gender;hypertension ? ?   ?Objective:  ?  ?There were no vitals filed for this visit. ?There is no height or weight on file to calculate BMI. ? ? ?  08/18/2021  ? 10:27 AM 07/13/2021  ?  9:02 PM 05/13/2021  ?  1:17 PM 04/02/2021  ?  9:13 AM 03/05/2021  ?  3:21 PM 02/27/2021  ?  7:49 PM 12/08/2020  ?  8:45 AM  ?Advanced Directives  ?Does Patient Have a Medical Advance Directive? Yes Yes Yes No Yes No No  ?Type of [place]product [place]   ?Does patient want to make changes to medical advance directive?  No - Patient declined  No - Patient declined No - Patient declined    ?Copy of HM[place]tonin Chart? No - copy requested    No - copy requested    ?Would patient like information on creating a medical advance directive?    No - Patient declined No - Patient declined No - Patient  declined   ? ? ?Current Medications (verified) ?Outpatient Encounter Medications as of 08/18/2021  ?Medication Sig  ? acetaminophen (TYLENOL) 500 MG tablet Take 500 mg by mouth every 6 (six) hours as needed for mild pain or headache.  ? atorvastatin (LIPITOR) 20 MG tablet Take 20 mg by mouth daily.  ? buPROPion (WELLBUTRIN XL) 150 MG 24 hr tablet Take 450 mg by mouth every morning.   ? busPIRone (BUSPAR) 10 MG tablet Take 1 tablet (10 mg total) by mouth 2 (two) times daily.  ? ELIQUIS 5 MG TABS tablet Take 1 tablet by mouth twice daily  ? felodipine (PLENDIL) 10 MG 24 hr tablet Take 1 tablet (10 mg total) by mouth daily. (Patient taking differently: Take 5 mg by mouth daily.)  ? hydrOXYzine (ATARAX) 10 MG tablet TAKE 1 TABLET BY MOUTH EVERY 6 HOURS AS NEEDED FOR ANXIETY OR  ITCHING  ? losartan (COZAAR) 100 MG tablet Take 100 mg by mouth daily.  ? metoprolol succinate (TOPROL XL) 50 MG 24 hr tablet Take 2 tablets (100 mg total) by mouth daily. Take with or immediately following a meal.  ? metoprolol succinate (TOPROL-XL) 25 MG 24 hr tablet Take 5 tablets (125 mg total) by mouth daily.  ? Multiple Vitamin (MULTIVITAMIN WITH MINERALS) TABS tablet Take 1 tablet by mouth daily.  ? pregabalin (LYRICA) 25 MG capsule Take 1 capsule  by mouth twice daily  ? spironolactone (ALDACTONE) 25 MG tablet Take 1 tablet (25 mg total) by mouth daily.  ? vitamin B-12 1000 MCG tablet Take 1 tablet (1,000 mcg total) by mouth daily.  ? Vitamin D, Ergocalciferol, (DRISDOL) 1.25 MG (50000 UNIT) CAPS capsule TAKE 1 CAPSULE BY MOUTH ONCE A WEEK ON FRIDAYS  ? [DISCONTINUED] escitalopram (LEXAPRO) 5 MG tablet Take 1 tablet (5 mg total) by mouth daily. (Patient not taking: Reported on 07/20/2021)  ? [DISCONTINUED] Saline (OCEAN NASAL SPRAY NA) Place 1 spray into both nostrils as needed (congestion).  ? ?No facility-administered encounter medications on file as of 08/18/2021.  ? ? ?Allergies (verified) ?Amlodipine and Gabapentin  ? ?History: ?Past  Medical History:  ?Diagnosis Date  ? Anxiety   ? Arthritis   ? OSTEO IN KNEE  ? Asthma   ? as child  ? Colon cancer ([place])   ? Depression   ? Elevated prostate specific antigen (PSA)   ? Generalized anxiety disorder 04/09/2015  ? Hematospermia   ? Hx antineoplastic chemotherapy 2010  ? Hypertension   ? Idiopathic peripheral neuropathy   ? TOES OF BOTH FEET DUE TO CHEMO  ? Incomplete bladder emptying   ? Malignant neoplasm of prostate ([place])   ? Nodular prostate with lower urinary tract symptoms   ? Panic attacks 04/09/2015  ? Primary localized osteoarthrosis of the knee, right 05/19/2016  ? Prostatitis   ? S/P total knee arthroplasty, left 05/19/2016  ? Spermatocele   ? Weak urinary stream   ? ?Past Surgical History:  ?Procedure Laterality Date  ? COLON RESECTION   NOV 2009  ? COLONOSCOPY    ? PROSTATE BIOPSY    ? SPERMATOCELECTOMY Left 05/07/2014  ? Procedure: LEFT SPERMATOCELECTOMY;  Surgeon: Malka So, MD;  Location: [facility];  Service: Urology;  Laterality: Left;  ? TONSILLECTOMY    ? TOTAL KNEE ARTHROPLASTY Left 04/21/2015  ? Procedure: LEFT TOTAL KNEE ARTHROPLASTY;  Surgeon: Elsie Saas, MD;  Location: [facility];  Service: Orthopedics;  Laterality: Left;  ? TOTAL KNEE ARTHROPLASTY Right 05/31/2016  ? Procedure: TOTAL KNEE ARTHROPLASTY;  Surgeon: Elsie Saas, MD;  Location: [facility];  Service: Orthopedics;  Laterality: Right;  ? TRANSURETHRAL RESECTION OF PROSTATE N/A 05/07/2014  ? Procedure: TRANSURETHRAL RESECTION OF THE PROSTATE WITH GYRUS INSTRUMENTS;  Surgeon: Malka So, MD;  Location: [facility];  Service: Urology;  Laterality: N/A;  ? ?Family History  ?Problem Relation Age of Onset  ? Stroke Father   ?     age 98  ? Cancer Brother   ?     neoplasm of brain  ? ?Social History  ? ?Socioeconomic History  ? Marital status: Married  ?  Spouse name: Not on file  ? Number of children: 9  ? Years of education: Not on file  ? Highest education level: Not on file  ?Occupational History  ? Occupation: retire  ?Tobacco Use  ?  Smoking status: Former  ?  Packs/day: 0.25  ?  Years: 3.00  ?  Pack years: 0.75  ?  Types: Cigarettes  ?  Quit date: 04/12/1972  ?  Years since quitting: 49.3  ? Smokeless tobacco: Never  ? Tobacco comments:  ?  smoked 1 cigarette per day SOME DAYS  ?Vaping Use  ? Vaping Use: Never used  ?Substance and Sexual Activity  ? Alcohol use: Yes  ?  Alcohol/week: 1.0 standard drink  ?  Types: 1 Cans of beer per week  ?  Comment: OCCASIONAL  ? Drug use: No  ? Sexual activity: Yes  ?Other Topics Concern  ? Not on file  ?Social History Narrative  ? George [name] will take care of him after his surgery    Her cell is [phone]  ? ?Social Determinants of Health  ? ?Financial Resource Strain: Low Risk   ? Difficulty of Paying Living Expenses: Not hard at all  ?Food Insecurity: No Food Insecurity  ? Worried About Charity fundraiser in the Last Year: Never true  ? Ran Out of Food in the Last Year: Never true  ?Transportation Needs: No Transportation Needs  ? Lack of Transportation (Medical): No  ? Lack of Transportation (Non-Medical): No  ?Physical Activity: Insufficiently Active  ? Days of Exercise per Week: 3 days  ? Minutes of Exercise per Session: 30 min  ?Stress: No Stress Concern Present  ? Feeling of Stress : Not at all  ?Social Connections: Moderately Isolated  ? Frequency of Communication with Friends and Family: Once a week  ? Frequency of Social Gatherings with Friends and Family: Twice a week  ? Attends Religious Services: Never  ? Active Member of Clubs or Organizations: No  ? Attends Archivist Meetings: Never  ? Marital Status: Married  ? ? ?Tobacco Counseling ?Counseling given: Not Answered ?Tobacco comments: smoked 1 cigarette per day SOME DAYS ? ? ?Clinical Intake: ? ?Pre-visit preparation completed: Yes ? ?Pain : No/denies pain ? ?  ? ?BMI - recorded: 28.07 ?Nutritional Status: BMI 25 -29 Overweight ?Nutritional Risks: None ?Diabetes: No ? ?How often do you need to have someone help you when you read  instructions, pamphlets, or other written materials from your doctor or pharmacy?: 1 - Never ? ?Diabetic?no ? ?Interpreter Needed?: No ? ?Information entered by :: Charlott Rakes, LPN ? ? ?Activities of Dail

## 2021-08-18 NOTE — Telephone Encounter (Signed)
Pt is requesting hydroxyzine 10 mg be called in for 120 pills instead of 90. Stating the 90 won't last for the month related to he takes 4 a day because its a need for him please advise ?

## 2021-08-18 NOTE — Telephone Encounter (Signed)
Please schedule HOS f/u with the pt. ? ?Thanks!  ?

## 2021-08-19 NOTE — Telephone Encounter (Signed)
Appt scheduled for 05/12 ?

## 2021-08-20 ENCOUNTER — Other Ambulatory Visit: Payer: Self-pay

## 2021-08-20 MED ORDER — HYDROXYZINE HCL 10 MG PO TABS: ORAL_TABLET | ORAL | 0 refills | Status: AC

## 2021-08-20 NOTE — Telephone Encounter (Signed)
Sent to pharmacy 

## 2021-08-21 ENCOUNTER — Inpatient Hospital Stay: Payer: Medicare Other | Admitting: Physician Assistant

## 2021-08-30 ENCOUNTER — Other Ambulatory Visit: Payer: Self-pay | Admitting: Physical Medicine and Rehabilitation

## 2021-08-30 ENCOUNTER — Other Ambulatory Visit: Payer: Self-pay | Admitting: Physician Assistant

## 2021-08-31 NOTE — Telephone Encounter (Signed)
Last visit: 07/20/21  Next visit: 10/26/21  Last filled: 08/04/21  Quantity: 60 w/ 0 refills

## 2021-09-03 ENCOUNTER — Ambulatory Visit: Payer: Medicare Other | Admitting: Adult Health

## 2021-09-14 ENCOUNTER — Other Ambulatory Visit: Payer: Self-pay | Admitting: Physician Assistant

## 2021-09-14 NOTE — Telephone Encounter (Signed)
Please see medication in question. This is on pt's med list twice. Filled by another provider in April and has refills.

## 2021-09-15 ENCOUNTER — Other Ambulatory Visit: Payer: Self-pay

## 2021-09-15 NOTE — Progress Notes (Signed)
Pt advised no longer taking this dosage and only taking what is prescribed by PCP. Pt will also be following up with Cardiologist.

## 2021-09-20 ENCOUNTER — Other Ambulatory Visit: Payer: Self-pay | Admitting: Physical Medicine and Rehabilitation

## 2021-09-28 ENCOUNTER — Other Ambulatory Visit: Payer: Self-pay | Admitting: Physical Medicine and Rehabilitation

## 2021-10-06 ENCOUNTER — Other Ambulatory Visit: Payer: Self-pay | Admitting: Physician Assistant

## 2021-10-16 ENCOUNTER — Other Ambulatory Visit: Payer: Self-pay | Admitting: Physical Medicine and Rehabilitation

## 2021-10-19 ENCOUNTER — Ambulatory Visit (INDEPENDENT_AMBULATORY_CARE_PROVIDER_SITE_OTHER): Payer: Medicare Other | Admitting: Physician Assistant

## 2021-10-19 ENCOUNTER — Other Ambulatory Visit: Payer: Self-pay | Admitting: Physician Assistant

## 2021-10-19 ENCOUNTER — Encounter: Payer: Self-pay | Admitting: Physician Assistant

## 2021-10-19 VITALS — BP 126/78 | HR 62 | Temp 98.3°F | Ht 72.0 in | Wt 207.6 lb

## 2021-10-19 DIAGNOSIS — M47819 Spondylosis without myelopathy or radiculopathy, site unspecified: Secondary | ICD-10-CM

## 2021-10-19 DIAGNOSIS — M461 Sacroiliitis, not elsewhere classified: Secondary | ICD-10-CM | POA: Diagnosis not present

## 2021-10-19 MED ORDER — APIXABAN 5 MG PO TABS: 5.0000 mg | ORAL_TABLET | Freq: Two times a day (BID) | ORAL | 0 refills | Status: DC

## 2021-10-19 MED ORDER — PREDNISONE 50 MG PO TABS: ORAL_TABLET | ORAL | 0 refills | Status: DC

## 2021-10-19 NOTE — Progress Notes (Signed)
Subjective:    Patient ID: George Oto., male    DOB: 1945-08-22, 76 y.o.   MRN: [id number]  Chief Complaint  Patient presents with   Back Pain    Pt coming in due to severe back pain since Friday; pt states hurting on both sides right above hip then all the pain went to left side same area and is now a lot worse. Sharp continuous pain that hurts to walk, sit, reach any thing per pt.    Back Pain   Patient is in today for low back pain. Here with wife.   Date of injury: 10/16/21 woke up with pain Mechanism of injury: No known injuries except had been on riding mower on an incline before that day Initial level of pain: 8/10 bilateral low back Current level of pain: 10/10 all left sided today with movement, 2/10 with sitting Associated symptoms: Negative for any fevers, saddle anesthesia, foot-drop, incontinence of urine or stool Treatments tried: Bengay helped some. Tylenol helps some.   Left sided pain now just above the hip.  Says over 20 years since back has hurt like this. No surgeries on this back before. Worse with walking. Doesn't bother him as much sleeping, but change in position needs help getting out of bed.    Past Medical History:  Diagnosis Date   Anxiety    Arthritis    OSTEO IN KNEE   Asthma    as child   Colon cancer ([place])    Depression    Elevated prostate specific antigen (PSA)    Generalized anxiety disorder 04/09/2015   Hematospermia    Hx antineoplastic chemotherapy 2010   Hypertension    Idiopathic peripheral neuropathy    TOES OF BOTH FEET DUE TO CHEMO   Incomplete bladder emptying    Malignant neoplasm of prostate ([place])    Nodular prostate with lower urinary tract symptoms    Panic attacks 04/09/2015   Primary localized osteoarthrosis of the knee, right 05/19/2016   Prostatitis    S/P total knee arthroplasty, left 05/19/2016   Spermatocele    Weak urinary stream     Past Surgical History:  Procedure Laterality Date   COLON RESECTION   NOV  2009   COLONOSCOPY     PROSTATE BIOPSY     SPERMATOCELECTOMY Left 05/07/2014   Procedure: LEFT SPERMATOCELECTOMY;  Surgeon: Malka So, MD;  [facility];  Service: Urology;  Laterality: Left;   TONSILLECTOMY     TOTAL KNEE ARTHROPLASTY Left 04/21/2015   Procedure: LEFT TOTAL KNEE ARTHROPLASTY;  Surgeon: Elsie Saas, MD;  Location: [facility];  Service: Orthopedics;  Laterality: Left;   TOTAL KNEE ARTHROPLASTY Right 05/31/2016   Procedure: TOTAL KNEE ARTHROPLASTY;  Surgeon: Elsie Saas, MD;  Location: [facility];  Service: Orthopedics;  Laterality: Right;   TRANSURETHRAL RESECTION OF PROSTATE N/A 05/07/2014   Procedure: TRANSURETHRAL RESECTION OF THE PROSTATE WITH GYRUS INSTRUMENTS;  Surgeon: Malka So, MD;  [facility];  Service: Urology;  Laterality: N/A;    Family History  Problem Relation Age of Onset   Stroke Father        age 68   Cancer Brother        neoplasm of brain    Social History   Tobacco Use   Smoking status: Former    Packs/day: 0.25    Years: 3.00    Total pack years: 0.75    Types: Cigarettes    Quit date: 04/12/1972  Years since quitting: 49.5   Smokeless tobacco: Never   Tobacco comments:    smoked 1 cigarette per day SOME DAYS  Vaping Use   Vaping Use: Never used  Substance Use Topics   Alcohol use: Yes    Alcohol/week: 1.0 standard drink of alcohol    Types: 1 Cans of beer per week    Comment: OCCASIONAL   Drug use: No     Allergies  Allergen Reactions   Amlodipine Other (See Comments)    Dizziness    Gabapentin     hallucinations    Review of Systems  Musculoskeletal:  Positive for back pain.   NEGATIVE UNLESS OTHERWISE INDICATED IN HPI      Objective:     BP 126/78 (BP Location: Right Arm)   Pulse 62   Temp 98.3 F (36.8 C) (Temporal)   Ht 6' (1.829 m)   Wt 207 lb 9.6 oz (94.2 kg)   SpO2 98%   BMI 28.16 kg/m   Wt Readings from Last 3 Encounters:  10/19/21 207 lb 9.6 oz (94.2 kg)  07/20/21 207 lb (93.9 kg)   07/13/21 200 lb 2.8 oz (90.8 kg)    BP Readings from Last 3 Encounters:  10/19/21 126/78  07/20/21 120/74  07/15/21 (!) 145/110     Physical Exam Constitutional:      Appearance: Normal appearance.  Cardiovascular:     Rate and Rhythm: Normal rate and regular rhythm.     Pulses: Normal pulses.     Heart sounds: Normal heart sounds. No murmur heard. Pulmonary:     Effort: Pulmonary effort is normal.     Breath sounds: Normal breath sounds.  Musculoskeletal:     Lumbar back: Negative right straight leg raise test and negative left straight leg raise test.       Back:     Right lower leg: No edema.     Left lower leg: No edema.     Comments: Good strength lower extremities, pushes / pulls against me well  Skin:    Findings: No rash.  Neurological:     General: No focal deficit present.     Mental Status: He is alert and oriented to person, place, and time.     Sensory: No sensory deficit.     Motor: No weakness.     Gait: Gait abnormal (slow moving secondary to pain).        Assessment & Plan:   Problem List Items Addressed This Visit   None Visit Diagnoses     Sacroiliitis ([place])    -  Primary   Relevant Medications   predniSONE (DELTASONE) 50 MG tablet   Arthritis of low back       Relevant Medications   predniSONE (DELTASONE) 50 MG tablet        Meds ordered this encounter  Medications   predniSONE (DELTASONE) 50 MG tablet    Sig: Take one tablet po qd x 5 days with food.    Dispense:  5 tablet    Refill:  0    Order Specific Question:   Supervising Provider    Answer:   Marin Olp [[id number]]   apixaban (ELIQUIS) 5 MG TABS tablet    Sig: Take 1 tablet (5 mg total) by mouth 2 (two) times daily.    Dispense:  180 tablet    Refill:  0    Order Specific Question:   Supervising Provider    Answer:   Marin Olp [  [id number]]    1. Sacroiliitis ([place]) 2. Arthritis of low back Degenerative changes bilateral hips, low back noted on XRAY 12/08/20 left  femur. Sx's c/w acute flare of pain. No red flags. Take prednisone as directed. Rest, move slowly. Recheck as scheduled; consider repeat imaging and PT if worse or no improvement.    This note was prepared with assistance of Systems analyst. Occasional wrong-word or sound-a-like substitutions may have occurred due to the inherent limitations of voice recognition software.    Maclane Holloran M Irvan Tiedt, PA-C

## 2021-10-21 DIAGNOSIS — I10 Essential (primary) hypertension: Secondary | ICD-10-CM | POA: Diagnosis not present

## 2021-10-21 DIAGNOSIS — I480 Paroxysmal atrial fibrillation: Secondary | ICD-10-CM | POA: Diagnosis not present

## 2021-10-21 DIAGNOSIS — E785 Hyperlipidemia, unspecified: Secondary | ICD-10-CM | POA: Diagnosis not present

## 2021-10-21 DIAGNOSIS — I6340 Cerebral infarction due to embolism of unspecified cerebral artery: Secondary | ICD-10-CM | POA: Diagnosis not present

## 2021-10-21 NOTE — Progress Notes (Signed)
Subjective:    Patient ID: George Oto., male    DOB: April 03, 1946, 76 y.o.   MRN: [id number]  HPI George [name] is a 76 year old man who presents for hospital follow-up after CVA.  1) anxiety -stated back into own routine since he has been home -lately it has been under excellent control -having anxiety once per month -he is using hydroxyzine about 4 per day -received Xanax from ED and is worried about addiction -BP appears excellent today -anything that scares him starts his anxiety, for example a police chase on TV.  -he watches a lot of TV but tries to watch more constructive things.  -He had a bad reaction to Paxil. He started ti have hallucinations with this. These have gone.  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 This really motivates him.   2) Cva --not having any deficits after the stroke with movement -he thinks the neuropathy plays a role in his movement.   3) Neuropathy -present since 2009/2010 -it is from chemo.  -affects his coordination -affects his sleep   Pain Inventory Average Pain 1 Pain Right Now 0 My pain is dull, tingling, and aching  In the last 24 hours, has pain interfered with the following? General activity 5 Relation with others 5 Enjoyment of life 7 What TIME of day is your pain at its worst? night Sleep (in general) Fair  Pain is worse with: walking and standing Pain improves with: rest and medication Relief from Meds:  on no meds   Family History  Problem Relation Age of Onset   Stroke Father        age 36   Cancer Brother        neoplasm of brain   Social History   Socioeconomic History   Marital status: Married    Spouse name: Not on file   Number of children: 9   Years of education: Not on file   Highest education level: Not on file  Occupational History   Occupation: retire  Tobacco Use   Smoking status: Former    Packs/day: 0.25    Years: 3.00    Total  pack years: 0.75    Types: Cigarettes    Quit date: 04/12/1972    Years since quitting: 49.5   Smokeless tobacco: Never   Tobacco comments:    smoked 1 cigarette per day SOME DAYS  Vaping Use   Vaping Use: Never used  Substance and Sexual Activity   Alcohol use: Yes    Alcohol/week: 1.0 standard drink of alcohol    Types: 1 Cans of beer per week    Comment: OCCASIONAL   Drug use: No   Sexual activity: Yes  Other Topics Concern   Not on file  Social History Narrative   Shawnie Dapper will take care of him after his surgery    Her cell is [phone]   Social Determinants of Health   Financial Resource Strain: Low Risk  (08/18/2021)   Overall Financial Resource Strain (CARDIA)    Difficulty of Paying Living Expenses: Not hard at all  Food Insecurity: No Food Insecurity (08/18/2021)   Hunger Vital Sign    Worried About Running Out of Food in the Last Year: Never true    [place] in the Last Year: Never true  Transportation Needs: No Transportation Needs (08/18/2021)   PRAPARE - Hydrologist (Medical):  No    Lack of Transportation (Non-Medical): No  Physical Activity: Insufficiently Active (08/18/2021)   Exercise Vital Sign    Days of Exercise per Week: 3 days    Minutes of Exercise per Session: 30 min  Stress: No Stress Concern Present (08/18/2021)   [place]    Feeling of Stress : Not at all  Social Connections: Moderately Isolated (08/18/2021)   Social Connection and Isolation Panel [NHANES]    Frequency of Communication with Friends and Family: Once a week    Frequency of Social Gatherings with Friends and Family: Twice a week    Attends Religious Services: Never    Marine scientist or Organizations: No    Attends Archivist Meetings: Never    Marital Status: Married   Past Surgical History:  Procedure Laterality Date   COLON RESECTION   NOV 2009   COLONOSCOPY      PROSTATE BIOPSY     SPERMATOCELECTOMY Left 05/07/2014   Procedure: LEFT SPERMATOCELECTOMY;  Surgeon: Malka So, MD;  Location: [facility];  Service: Urology;  Laterality: Left;   TONSILLECTOMY     TOTAL KNEE ARTHROPLASTY Left 04/21/2015   Procedure: LEFT TOTAL KNEE ARTHROPLASTY;  Surgeon: Elsie Saas, MD;  Location: [place];  Service: Orthopedics;  Laterality: Left;   TOTAL KNEE ARTHROPLASTY Right 05/31/2016   Procedure: TOTAL KNEE ARTHROPLASTY;  Surgeon: Elsie Saas, MD;  Location: [place];  Service: Orthopedics;  Laterality: Right;   TRANSURETHRAL RESECTION OF PROSTATE N/A 05/07/2014   Procedure: TRANSURETHRAL RESECTION OF THE PROSTATE WITH GYRUS INSTRUMENTS;  Surgeon: Malka So, MD;  Location: [facility];  Service: Urology;  Laterality: N/A;   Past Surgical History:  Procedure Laterality Date   COLON RESECTION   NOV 2009   COLONOSCOPY     PROSTATE BIOPSY     SPERMATOCELECTOMY Left 05/07/2014   Procedure: LEFT SPERMATOCELECTOMY;  Surgeon: Malka So, MD;  [facility];  Service: Urology;  Laterality: Left;   TONSILLECTOMY     TOTAL KNEE ARTHROPLASTY Left 04/21/2015   Procedure: LEFT TOTAL KNEE ARTHROPLASTY;  Surgeon: Elsie Saas, MD;  Location: [place];  Service: Orthopedics;  Laterality: Left;   TOTAL KNEE ARTHROPLASTY Right 05/31/2016   Procedure: TOTAL KNEE ARTHROPLASTY;  Surgeon: Elsie Saas, MD;  Location: [place];  Service: Orthopedics;  Laterality: Right;   TRANSURETHRAL RESECTION OF PROSTATE N/A 05/07/2014   Procedure: TRANSURETHRAL RESECTION OF THE PROSTATE WITH GYRUS INSTRUMENTS;  Surgeon: Malka So, MD;  [facility];  Service: Urology;  Laterality: N/A;   Past Medical History:  Diagnosis Date   Anxiety    Arthritis    OSTEO IN KNEE   Asthma    as child   Colon cancer ([place])    Depression    Elevated prostate specific antigen (PSA)    Generalized anxiety disorder 04/09/2015   Hematospermia    Hx antineoplastic chemotherapy 2010   Hypertension    Idiopathic  peripheral neuropathy    TOES OF BOTH FEET DUE TO CHEMO   Incomplete bladder emptying    Malignant neoplasm of prostate ([place])    Nodular prostate with lower urinary tract symptoms    Panic attacks 04/09/2015   Primary localized osteoarthrosis of the knee, right 05/19/2016   Prostatitis    S/P total knee arthroplasty, left 05/19/2016   Spermatocele    Weak urinary stream      BP 122/76   Pulse 65  Ht 6' (1.829 m)   Wt 205 lb 6.4 oz (93.2 kg)   SpO2 97%   BMI 27.86 kg/m   Opioid Risk Score:   Fall Risk Score:  `1  Depression screen [facility] 2/9     10/19/2021   12:56 PM 08/18/2021   10:25 AM 07/20/2021   11:38 AM 04/10/2021    3:03 PM 04/22/2014    2:35 PM  Depression screen PHQ 2/9  Decreased Interest 2 0 2 2 0  Down, Depressed, Hopeless 0 0 1 0 0  PHQ - 2 Score 2 0 3 2 0  Altered sleeping 1  0 0   Tired, decreased energy 0  3 3   Change in appetite 0  0 0   Feeling bad or failure about yourself  0  0 0   Trouble concentrating '1  2 1   '$ Moving slowly or fidgety/restless 0  0 0   Suicidal thoughts 1  0 0   PHQ-9 Score '5  8 6   '$ Difficult doing work/chores Somewhat difficult  Not difficult at all       Review of Systems  Constitutional: Negative.   HENT: Negative.    Eyes: Negative.   Respiratory: Negative.    Cardiovascular: Negative.   Gastrointestinal: Negative.   Endocrine: Negative.   Genitourinary: Negative.   Musculoskeletal:  Positive for back pain.  Skin: Negative.   Allergic/Immunologic: Negative.   Neurological: Negative.   Hematological:  Bruises/bleeds easily.       Eliquis  Psychiatric/Behavioral:  Positive for dysphoric mood. The patient is nervous/anxious.   All other systems reviewed and are negative.      Objective:   Physical Exam Gen: no distress, normal appearing HEENT: oral mucosa pink and moist, NCAT Cardio: Reg rate Chest: normal effort, normal rate of breathing Abd: soft, non-distended Ext: no edema Psych: pleasant, normal affect,  anxiety Skin: intact Neuro: Alert and oriented x3    Assessment & Plan:  1) CVA -continue therapies -would benefit from handicap placard to increase her mobility in the community -provided dietary and exercise counseling -discussed that hypertension is number one reversible risk factor for stroke.  Discussed his current excellent control.   2) Anxiety: -Discussed Lexapro- clear with cardiologist first given prolonged QT syndrome -continue hydroxyzine every 6 hours as needed -Refilled Ativan, discussed risks- hope to stop once Lexapro or other medication can be started -referred to psychology for behavioral therapy -Discussed exercise and meditation as tools to decrease anxiety. -Recommended Down Dog Yoga app -Discussed spending time outdoors. -Discussed positive re-framing of anxiety.  -Discussed the following foods that have been show to reduce anxiety: 1) [place] nuts, mushrooms, soy beans due to their high selenium content. Upper limit of toxicity of selenium is 473mg/day so no more than 3-4 bBolivianuts per day.  2) Fatty fish such as salmon, mackerel, sardines, trout, and herring- high in omega-3 fatty acids 3) Eggs- increases serotonin and dopamine 4) Pumpkin seeds- high in omega-3 fatty acids 5) dark chocolate- high in flavanols that increase blood flow to brain 6) turmeric- take with black pepper to increase absorption 7) chamomile tea- antioxidant and anti-inflammatory properties 8) yogurt without sugar- supports gut-brain axis 9) green tea- contains L- theanine 10) blueberries- high in vitamin C and antioxidants 11) t[place] high in tryptophan which gets converted to serotonin 12) bell peppers- rich in vitamin C and antioxidants 13) citrus fruits- rich in vitamin C and antioxidants 14) almonds- high in vitamin E and healthy fats 15)  chia seeds- high in omega-3 fatty acids  3) Peripheral neuropathy -Discussed Qutenza as an option for neuropathic pain control. Discussed  that this is a capsaicin patch, stronger than capsaicin cream. Discussed that it is currently approved for diabetic peripheral neuropathy and post-herpetic neuralgia, but that it has also shown benefit in treating other forms of neuropathy. Provided patient with link to site to learn more about the patch: [employer].fr. Discussed that the patch would be placed in office and benefits usually last 3 months. Discussed that unintended exposure to capsaicin can cause severe irritation of eyes, mucous membranes, respiratory tract, and skin, but that Qutenza is a local treatment and does not have the systemic side effects of other nerve medications. Discussed that there may be pain, itching, erythema, and decreased sensory function associated with the application of Qutenza. Side effects usually subside within 1 week. A cold pack of analgesic medications can help with these side effects. Blood pressure can also be increased due to pain associated with administration of the patch.

## 2021-10-22 ENCOUNTER — Encounter: Payer: Self-pay | Admitting: Physical Medicine and Rehabilitation

## 2021-10-22 ENCOUNTER — Ambulatory Visit: Payer: Medicare Other | Admitting: Physical Medicine and Rehabilitation

## 2021-10-22 ENCOUNTER — Encounter
Payer: Medicare Other | Attending: Physical Medicine and Rehabilitation | Admitting: Physical Medicine and Rehabilitation

## 2021-10-22 VITALS — BP 122/76 | HR 65 | Ht 72.0 in | Wt 205.4 lb

## 2021-10-22 DIAGNOSIS — F411 Generalized anxiety disorder: Secondary | ICD-10-CM | POA: Insufficient documentation

## 2021-10-22 DIAGNOSIS — G620 Drug-induced polyneuropathy: Secondary | ICD-10-CM | POA: Insufficient documentation

## 2021-10-22 DIAGNOSIS — T451X5A Adverse effect of antineoplastic and immunosuppressive drugs, initial encounter: Secondary | ICD-10-CM | POA: Insufficient documentation

## 2021-10-26 ENCOUNTER — Ambulatory Visit: Payer: Medicare Other | Admitting: Physician Assistant

## 2021-10-28 ENCOUNTER — Ambulatory Visit (INDEPENDENT_AMBULATORY_CARE_PROVIDER_SITE_OTHER): Payer: Medicare Other | Admitting: Physician Assistant

## 2021-10-28 VITALS — BP 130/74 | HR 60 | Temp 97.7°F | Ht 72.0 in | Wt 206.2 lb

## 2021-10-28 DIAGNOSIS — G620 Drug-induced polyneuropathy: Secondary | ICD-10-CM

## 2021-10-28 DIAGNOSIS — I4891 Unspecified atrial fibrillation: Secondary | ICD-10-CM

## 2021-10-28 DIAGNOSIS — T451X5A Adverse effect of antineoplastic and immunosuppressive drugs, initial encounter: Secondary | ICD-10-CM

## 2021-10-28 DIAGNOSIS — M461 Sacroiliitis, not elsewhere classified: Secondary | ICD-10-CM | POA: Diagnosis not present

## 2021-10-28 DIAGNOSIS — Z8673 Personal history of transient ischemic attack (TIA), and cerebral infarction without residual deficits: Secondary | ICD-10-CM | POA: Diagnosis not present

## 2021-10-28 DIAGNOSIS — F410 Panic disorder [episodic paroxysmal anxiety] without agoraphobia: Secondary | ICD-10-CM | POA: Diagnosis not present

## 2021-10-28 DIAGNOSIS — N1832 Chronic kidney disease, stage 3b: Secondary | ICD-10-CM

## 2021-10-28 DIAGNOSIS — I10 Essential (primary) hypertension: Secondary | ICD-10-CM | POA: Diagnosis not present

## 2021-10-28 DIAGNOSIS — F411 Generalized anxiety disorder: Secondary | ICD-10-CM | POA: Diagnosis not present

## 2021-10-28 DIAGNOSIS — M130 Polyarthritis, unspecified: Secondary | ICD-10-CM | POA: Diagnosis not present

## 2021-10-28 LAB — COMPREHENSIVE METABOLIC PANEL
ALT: 30 U/L (ref 0–53)
AST: 21 U/L (ref 0–37)
Albumin: 3.9 g/dL (ref 3.5–5.2)
Alkaline Phosphatase: 81 U/L (ref 39–117)
BUN: 29 mg/dL — ABNORMAL HIGH (ref 6–23)
CO2: 25 mEq/L (ref 19–32)
Calcium: 9.1 mg/dL (ref 8.4–10.5)
Chloride: 103 mEq/L (ref 96–112)
Creatinine, Ser: 1.85 mg/dL — ABNORMAL HIGH (ref 0.40–1.50)
GFR: 34.58 mL/min — ABNORMAL LOW (ref >60.00)
Glucose, Bld: 101 mg/dL — ABNORMAL HIGH (ref 70–99)
Potassium: 3.9 mEq/L (ref 3.5–5.1)
Sodium: 137 mEq/L (ref 135–145)
Total Bilirubin: 0.8 mg/dL (ref 0.2–1.2)
Total Protein: 7 g/dL (ref 6.0–8.3)

## 2021-10-28 LAB — C-REACTIVE PROTEIN: CRP: <1 mg/dL (ref 0.5–20.0)

## 2021-10-28 LAB — SEDIMENTATION RATE: Sed Rate: 20 mm/hr (ref 0–20)

## 2021-10-28 LAB — URIC ACID: Uric Acid, Serum: 4.8 mg/dL (ref 4.0–7.8)

## 2021-10-28 NOTE — Patient Instructions (Addendum)
Labs today - checking kidney status and ruling out autoimmune issues causing arthritis Please go to [facility] for XRAY of your back Consider PT at our office for your back pain, Selinda Eon or Ander Purpura are very good Keep up good work!!  Call if any concerns

## 2021-10-28 NOTE — Progress Notes (Signed)
Subjective:    Patient ID: George [name]., male    DOB: 1945/10/16, 76 y.o.   MRN: [id number]  Chief Complaint  Patient presents with   Follow-up    Pt being seen as 6 mon f/u; pt states things were going well for a few days then Sunday pain starting back but worse than before, also experiencing pain on right hand in between pinky and ring finger that feels like a shock;     HPI Patient is in today for regular f/up and recheck from sacroiliitis pain on 10/19/21.  See A/P for details.   Past Medical History:  Diagnosis Date   Anxiety    Arthritis    OSTEO IN KNEE   Asthma    as child   Colon cancer ([place])    Depression    Elevated prostate specific antigen (PSA)    Generalized anxiety disorder 04/09/2015   Hematospermia    Hx antineoplastic chemotherapy 2010   Hypertension    Idiopathic peripheral neuropathy    TOES OF BOTH FEET DUE TO CHEMO   Incomplete bladder emptying    Malignant neoplasm of prostate ([place])    Nodular prostate with lower urinary tract symptoms    Panic attacks 04/09/2015   Primary localized osteoarthrosis of the knee, right 05/19/2016   Prostatitis    S/P total knee arthroplasty, left 05/19/2016   Spermatocele    Weak urinary stream     Past Surgical History:  Procedure Laterality Date   COLON RESECTION   NOV 2009   COLONOSCOPY     PROSTATE BIOPSY     SPERMATOCELECTOMY Left 05/07/2014   Procedure: LEFT SPERMATOCELECTOMY;  Surgeon: Malka So, MD;  [facility];  Service: Urology;  Laterality: Left;   TONSILLECTOMY     TOTAL KNEE ARTHROPLASTY Left 04/21/2015   Procedure: LEFT TOTAL KNEE ARTHROPLASTY;  Surgeon: Elsie Saas, MD;  Location: [place];  Service: Orthopedics;  Laterality: Left;   TOTAL KNEE ARTHROPLASTY Right 05/31/2016   Procedure: TOTAL KNEE ARTHROPLASTY;  Surgeon: Elsie Saas, MD;  Location: [place];  Service: Orthopedics;  Laterality: Right;   TRANSURETHRAL RESECTION OF PROSTATE N/A 05/07/2014   Procedure: TRANSURETHRAL RESECTION  OF THE PROSTATE WITH GYRUS INSTRUMENTS;  Surgeon: Malka So, MD;  [facility];  Service: Urology;  Laterality: N/A;    Family History  Problem Relation Age of Onset   Stroke Father        age 38   Cancer Brother        neoplasm of brain    Social History   Tobacco Use   Smoking status: Former    Packs/day: 0.25    Years: 3.00    Total pack years: 0.75    Types: Cigarettes    Quit date: 04/12/1972    Years since quitting: 49.5   Smokeless tobacco: Never   Tobacco comments:    smoked 1 cigarette per day SOME DAYS  Vaping Use   Vaping Use: Never used  Substance Use Topics   Alcohol use: Yes    Alcohol/week: 1.0 standard drink of alcohol    Types: 1 Cans of beer per week    Comment: OCCASIONAL   Drug use: No     Allergies  Allergen Reactions   Amlodipine Other (See Comments)    Dizziness    Gabapentin     hallucinations    Review of Systems NEGATIVE UNLESS OTHERWISE INDICATED IN HPI      Objective:  BP 130/74 (BP Location: Right Arm)   Pulse 60   Temp 97.7 F (36.5 C) (Temporal)   Ht 6' (1.829 m)   Wt 206 lb 3.2 oz (93.5 kg)   SpO2 97%   BMI 27.97 kg/m   Wt Readings from Last 3 Encounters:  10/28/21 206 lb 3.2 oz (93.5 kg)  10/22/21 205 lb 6.4 oz (93.2 kg)  10/19/21 207 lb 9.6 oz (94.2 kg)    BP Readings from Last 3 Encounters:  10/28/21 130/74  10/22/21 122/76  10/19/21 126/78     Physical Exam Constitutional:      Appearance: Normal appearance.  Cardiovascular:     Rate and Rhythm: Normal rate and regular rhythm.     Pulses: Normal pulses.     Heart sounds: Normal heart sounds. No murmur heard. Pulmonary:     Effort: Pulmonary effort is normal.     Breath sounds: Normal breath sounds.  Musculoskeletal:     Lumbar back: Negative right straight leg raise test and negative left straight leg raise test.       Back:     Right lower leg: No edema.     Left lower leg: No edema.     Comments: Good strength lower extremities,  pushes / pulls against me well.  Complains of pain in webbing of right hand between ring and pinky fingers. No swelling or redness noted. Good ROM. Good grip strength.   Skin:    Findings: No rash.  Neurological:     General: No focal deficit present.     Mental Status: He is alert and oriented to person, place, and time.     Sensory: No sensory deficit.     Motor: No weakness.     Gait: Gait abnormal (slow moving secondary to pain).        Assessment & Plan:   Problem List Items Addressed This Visit       Cardiovascular and Mediastinum   Atrial fibrillation with RVR (HCC)   Essential hypertension     Nervous and Auditory   Chemotherapy-induced neuropathy (HCC)     Genitourinary   Chronic kidney disease, stage 3b (HCC)   Relevant Orders   Comprehensive metabolic panel (Completed)     Other   Generalized anxiety disorder   Panic attacks   Other Visit Diagnoses     History of CVA (cerebrovascular accident)    -  Primary   Relevant Orders   Comprehensive metabolic panel (Completed)   Arthritis of multiple sites       Relevant Orders   Comprehensive metabolic panel (Completed)   Sedimentation rate (Completed)   C-reactive protein (Completed)   Rheumatoid factor (Completed)   ANA (Completed)   Uric acid (Completed)   DG Lumbar Spine Complete (Completed)   Sacroiliitis (HCC)       Relevant Orders   Comprehensive metabolic panel (Completed)   Sedimentation rate (Completed)   C-reactive protein (Completed)   DG Lumbar Spine Complete (Completed)       1. History of CVA (cerebrovascular accident) Doing well Anticoagulated on Eliquis 5 mg BID  2. Chronic kidney disease, stage 3b ([employer]) Recheck CMP today BP is under good control Does not take NSAIDs  3. Essential hypertension Stable, well controlled Cozaar 100 mg daily Aldactone 25 mg daily Felodipine 5 mg daily  4. Atrial fibrillation with RVR (HCC) Doing well, follows with cardiology Currently on  Toprol XL 25 mg four tablets daily  5. Panic attacks 6. Generalized anxiety disorder  Stable and well controlled at this time Wellbutrin XL 150 mg - takes 450 mg daily Atarax 10 mg daily Follows with psych  7. Chemotherapy-induced neuropathy (HCC) Lyrica 25 mg BID is helping; will be trialing Qutenza soon Following with [facility]  8. Arthritis of multiple sites 9. Sacroiliitis ([place]) Plan to check XRAY of low back Consider PT at our office Autoimmune work-up today in labs    Return in about 6 months (around 04/30/2022) for recheck.  This note was prepared with assistance of Systems analyst. Occasional wrong-word or sound-a-like substitutions may have occurred due to the inherent limitations of voice recognition software.  Time Spent: 32 minutes of total time was spent on the date of the encounter performing the following actions: chart review prior to seeing the patient, obtaining history, performing a medically necessary exam, counseling on the treatment plan, placing orders, and documenting in our EHR.       Tahjae Durr M Harvin Konicek, PA-C

## 2021-10-29 LAB — RHEUMATOID FACTOR: Rheumatoid fact SerPl-aCnc: <14 IU/mL (ref <14)

## 2021-10-29 LAB — ANA: Anti Nuclear Antibody (ANA): NEGATIVE

## 2021-10-30 ENCOUNTER — Ambulatory Visit (INDEPENDENT_AMBULATORY_CARE_PROVIDER_SITE_OTHER)
Admission: RE | Admit: 2021-10-30 | Discharge: 2021-10-30 | Disposition: A | Discharge status: Home / Self Care | Payer: Medicare Other | Source: Ambulatory Visit | Attending: Physician Assistant | Admitting: Physician Assistant

## 2021-10-30 DIAGNOSIS — M461 Sacroiliitis, not elsewhere classified: Secondary | ICD-10-CM | POA: Diagnosis not present

## 2021-10-30 DIAGNOSIS — M5136 Other intervertebral disc degeneration, lumbar region: Secondary | ICD-10-CM | POA: Diagnosis not present

## 2021-10-30 DIAGNOSIS — M130 Polyarthritis, unspecified: Secondary | ICD-10-CM

## 2021-10-30 DIAGNOSIS — M545 Low back pain, unspecified: Secondary | ICD-10-CM | POA: Diagnosis not present

## 2021-11-05 ENCOUNTER — Other Ambulatory Visit: Payer: Self-pay | Admitting: Physician Assistant

## 2021-11-05 DIAGNOSIS — M47819 Spondylosis without myelopathy or radiculopathy, site unspecified: Secondary | ICD-10-CM

## 2021-11-05 DIAGNOSIS — M461 Sacroiliitis, not elsewhere classified: Secondary | ICD-10-CM

## 2021-11-06 ENCOUNTER — Encounter: Payer: Self-pay | Admitting: Physician Assistant

## 2021-11-06 NOTE — Telephone Encounter (Signed)
George [name] spoke with patient and informed him of appt with physical medicine for Qutenza patch coming up.

## 2021-11-06 NOTE — Telephone Encounter (Signed)
I think patient may be confused by lab result message can you please advise if this is something we are sending in for him?

## 2021-11-08 ENCOUNTER — Encounter: Payer: Self-pay | Admitting: Physician Assistant

## 2021-11-09 ENCOUNTER — Ambulatory Visit: Payer: Medicare Other | Admitting: Psychology

## 2021-11-10 ENCOUNTER — Encounter: Payer: Self-pay | Admitting: Physician Assistant

## 2021-11-10 NOTE — Telephone Encounter (Signed)
Patient was contacted on 11/06/21 and lvm to callback to schedule. Pt unable to get in vm box so was unaware of call. Advised [facility] phone number to callback and schedule an appt.

## 2021-11-10 NOTE — Telephone Encounter (Signed)
Spoke with pt wife Kathryne Gin ph# [phone] and advised Alyssa recommendations. Advised the only patech that was ever mentioned was when he and I discussed lab results and advised Physical Medicine Dr would be prescribing a patch per Alyssa's notes. Pt wife verbalized understanding and states pain going dow into patient side and thigh, advised evaluation at ED dept for pain 10/10.

## 2021-11-11 NOTE — Progress Notes (Unsigned)
   I, Peterson Lombard, LAT, ATC acting as a scribe for Lynne Leader, MD.  Subjective:    CC: Low back pain  HPI: Pt is a 76 y/o male c/o LBP ongoing since early July. Pt locates pain to   Radiating pain: LE numbness/tingling: LE weakness: Aggravates: Treatments tried:  Dx imaging: 10/30/21 L-spine XR  Pertinent review of Systems: ***  Relevant historical information: ***   Objective:   There were no vitals filed for this visit. General: Well Developed, well nourished, and in no acute distress.   MSK: ***  Lab and Radiology Results No results found for this or any previous visit (from the past 72 hour(s)). No results found.    Impression and Recommendations:    Assessment and Plan: 76 y.o. male with ***.  PDMP not reviewed this encounter. No orders of the defined types were placed in this encounter.  No orders of the defined types were placed in this encounter.   Discussed warning signs or symptoms. Please see discharge instructions. Patient expresses understanding.   ***

## 2021-11-12 ENCOUNTER — Ambulatory Visit (INDEPENDENT_AMBULATORY_CARE_PROVIDER_SITE_OTHER): Payer: Medicare Other | Admitting: Family Medicine

## 2021-11-12 VITALS — BP 112/80 | HR 69 | Ht 72.0 in | Wt 205.0 lb

## 2021-11-12 DIAGNOSIS — M5442 Lumbago with sciatica, left side: Secondary | ICD-10-CM | POA: Diagnosis not present

## 2021-11-12 DIAGNOSIS — Z8673 Personal history of transient ischemic attack (TIA), and cerebral infarction without residual deficits: Secondary | ICD-10-CM

## 2021-11-12 MED ORDER — PREDNISONE 50 MG PO TABS: 50.0000 mg | ORAL_TABLET | Freq: Every day | ORAL | 0 refills | Status: DC

## 2021-11-12 MED ORDER — CYCLOBENZAPRINE HCL 5 MG PO TABS: 5.0000 mg | ORAL_TABLET | Freq: Three times a day (TID) | ORAL | 1 refills | Status: DC | PRN

## 2021-11-12 MED ORDER — HYDROCODONE-ACETAMINOPHEN 5-325 MG PO TABS: 1.0000 | ORAL_TABLET | Freq: Four times a day (QID) | ORAL | 0 refills | Status: DC | PRN

## 2021-11-12 MED ORDER — PREGABALIN 75 MG PO CAPS: 75.0000 mg | ORAL_CAPSULE | Freq: Two times a day (BID) | ORAL | 3 refills | Status: DC | PRN

## 2021-11-12 NOTE — Patient Instructions (Addendum)
Thank you for coming in today.   I've referred you to Physical Therapy.  Let us know if you don't hear from them in one week.   Try the medicine.   Recheck in 1 month.   If this is worsening let me know and I will get a MRI.   Come back or go to the emergency room if you notice new weakness new numbness problems walking or bowel or bladder problems.

## 2021-11-25 ENCOUNTER — Encounter: Payer: Self-pay | Admitting: Family Medicine

## 2021-11-25 NOTE — Therapy (Unsigned)
OUTPATIENT PHYSICAL THERAPY THORACOLUMBAR EVALUATION   Patient Name: George [name]. MRN: [id number] DOB:1946-03-08, 76 y.o., male Today's Date: 11/26/2021   PT End of Session - 11/26/21 0925     Visit Number 1    Number of Visits 16    Date for PT Re-Evaluation 01/21/22    Authorization Type medicare    Progress Note Due on Visit 10    PT Start Time 0930    PT Stop Time 1010    PT Time Calculation (min) 40 min    Activity Tolerance Patient tolerated treatment well             Past Medical History:  Diagnosis Date   Anxiety    Arthritis    OSTEO IN KNEE   Asthma    as child   Colon cancer ([place])    Depression    Elevated prostate specific antigen (PSA)    Generalized anxiety disorder 04/09/2015   Hematospermia    Hx antineoplastic chemotherapy 2010   Hypertension    Idiopathic peripheral neuropathy    TOES OF BOTH FEET DUE TO CHEMO   Incomplete bladder emptying    Malignant neoplasm of prostate ([place])    Nodular prostate with lower urinary tract symptoms    Panic attacks 04/09/2015   Primary localized osteoarthrosis of the knee, right 05/19/2016   Prostatitis    S/P total knee arthroplasty, left 05/19/2016   Spermatocele    Weak urinary stream    Past Surgical History:  Procedure Laterality Date   COLON RESECTION   NOV 2009   COLONOSCOPY     PROSTATE BIOPSY     SPERMATOCELECTOMY Left 05/07/2014   Procedure: LEFT SPERMATOCELECTOMY;  Surgeon: Malka So, MD;  Location: [facility];  Service: Urology;  Laterality: Left;   TONSILLECTOMY     TOTAL KNEE ARTHROPLASTY Left 04/21/2015   Procedure: LEFT TOTAL KNEE ARTHROPLASTY;  Surgeon: Elsie Saas, MD;  Location: [facility];  Service: Orthopedics;  Laterality: Left;   TOTAL KNEE ARTHROPLASTY Right 05/31/2016   Procedure: TOTAL KNEE ARTHROPLASTY;  Surgeon: Elsie Saas, MD;  Location: [facility];  Service: Orthopedics;  Laterality: Right;   TRANSURETHRAL RESECTION OF PROSTATE N/A 05/07/2014   Procedure: TRANSURETHRAL RESECTION OF  THE PROSTATE WITH GYRUS INSTRUMENTS;  Surgeon: Malka So, MD;  Location: [facility];  Service: Urology;  Laterality: N/A;   Patient Active Problem List   Diagnosis Date Noted   TIA (transient ischemic attack) 07/15/2021   Ataxia 07/13/2021   Chronic kidney disease, stage 3b ([place]) 07/13/2021   AKI (acute kidney injury) ([place])    Dyslipidemia    Essential hypertension    Hemiparesis affecting left side as late effect of stroke ([place])    Subcortical infarction ([place]) 03/05/2021   Recurrent major depressive disorder, in partial remission ([place])    CVA (cerebral vascular accident) ([place]) 02/27/2021   Atrial fibrillation with RVR ([place]) 02/27/2021   Hypokalemia 02/27/2021   QT prolongation 02/27/2021   Epistaxis, recurrent 07/04/2019   Polyneuropathy 01/31/2018   Chemotherapy-induced neuropathy ([place]) 01/31/2018   B12 deficiency 01/31/2018   Prediabetes 01/31/2018   Basal ganglia infarction ([place]) 01/31/2018   SBO (small bowel obstruction) ([place]) 10/20/2017   Deviated septum 06/28/2017   Nasal turbinate hypertrophy 06/28/2017   Rhinitis, chronic 06/28/2017   Primary localized osteoarthritis of right knee 05/31/2016   Primary localized osteoarthrosis of the knee, right 05/19/2016   S/P total knee arthroplasty, left 05/19/2016   Dyspnea 01/20/2016   DJD (degenerative joint disease) of  knee 04/21/2015   Primary localized osteoarthritis of left knee 04/09/2015   Generalized anxiety disorder 04/09/2015   Panic attacks 04/09/2015   Spermatocele 05/08/2014   Benign localized hyperplasia of prostate with urinary obstruction 05/07/2014   Malignant neoplasm of prostate ([place]) 04/22/2014    PCP: Theresa Duty   REFERRING PROVIDER: Gregor Hams, MD  REFERRING DIAG: [phone] (ICD-10-CM) - Acute left-sided low back pain with left-sided sciatica  Rationale for Evaluation and Treatment Rehabilitation  THERAPY DIAG:  Other low back pain - Plan: PT plan of care cert/re-cert  Muscle weakness  (generalized) - Plan: PT plan of care cert/re-cert  ONSET DATE: 05/19/35- was riding a mower up an incline  SUBJECTIVE:                                                                                                                                                                                           SUBJECTIVE STATEMENT: States that he had back pain for 20 years and then is stopped and then it came back and it is worse then it used to be. State he can't do much of anything. States that when he sits for 20 minutes he can't seem to get up and straighten up. States his recent stroke caused left sided weakness. States that he feels nauseous and he thinks it is because he didn't eat anything with his medications. Reports he was in a lot of pain  has been on prednisone, oxycodone   PERTINENT HISTORY:  Neuropathy, L TKA 2018, HTN, Colon cancer, anxiety, CVA 4/23 (most recent stroke), 11/22 first stroke  PAIN:  Are you having pain? Yes: NPRS scale: 3/10 Pain location: lower back  and can shoot to the left side and thigh/knee  Pain description: sharp pain Aggravating factors: walking, bending Relieving factors: laying on back, medication   PRECAUTIONS: None  WEIGHT BEARING RESTRICTIONS No  FALLS:  Has patient fallen in last 6 months? No  LIVING ENVIRONMENT: Lives with: lives with their family Lives in: House/apartment Stairs: no Has following equipment at home: Environmental consultant - 2 wheeled  OCCUPATION: not working  PLOF: Independent with basic ADLs  PATIENT GOALS have less back pain, wants to get walk back and gait back, be able to sit for more than 15 minutes without difficulty getting up   OBJECTIVE:   SCREENING FOR RED FLAGS: Bowel or bladder incontinence: No Spinal tumors: No Cauda equina syndrome: No Compression fracture: No Abdominal aneurysm: No  COGNITION:  Overall cognitive status: Within functional limits for tasks assessed        POSTURE: rounded shoulders, forward  head, flexed trunk , and weight shift right  PALPATION: hypomobility noted in lumbar spine, pain in left hip with spring testing of L4/5 SP  LUMBAR ROM:   Active  A/PROM  eval  Flexion 75 %limited*  Extension 100% limited *  Right lateral flexion 75 %limited*  Left lateral flexion 75 %limited*  Right rotation   Left rotation    (Blank rows = not tested)  *pain     LE Measurements Lower Extremity Right 11/26/2021 Left 11/26/2021   A/PROM MMT A/PROM MMT  Hip Flexion      Hip Extension      Hip Abduction      Hip Adduction      Hip Internal rotation      Hip External rotation      Knee Flexion      Knee Extension      Ankle Dorsiflexion      Ankle Plantarflexion      Ankle Inversion      Ankle Eversion       (Blank rows = not tested)  * pain   LUMBAR SPECIAL TESTS:   Prone lying - felt good after 5 minutes, Repeated extensions in prone - felt good - symptoms resolved  GAIT: Distance walked: 25 feet Assistive device utilized: None Level of assistance: Modified independence Comments: uses right side, slumped,reduced step length on right     TODAY'S TREATMENT  11/26/2021 Therapeutic Exercise:  Aerobic: Supine: Prone: lying, (1 pillow--> no pillow), prone on elbows and prone extensions  Seated:  Standing: lumbar extensoin Neuromuscular Re-education: Manual Therapy: Therapeutic Activity: Self Care: Trigger Point Dry Needling:  Modalities:     PATIENT EDUCATION:  Education details: on current presentation, on HEP, on clinical outcomes score and POC, on importance of multiple lumbar extensions, lumbar support with seated position Person educated: Patient Education method: Explanation, Demonstration, and Handouts Education comprehension: verbalized understanding  HOME EXERCISE PROGRAM: [id number]  ASSESSMENT:  CLINICAL IMPRESSION: Patient presents with lumbar pain that refers down the leg and is worse after sitting and transitioning to standing  position as he feels like he gets stuck. Patient tolerated  repeated extensions well and would benefit from continued PT to improve overall pain and function.    OBJECTIVE IMPAIRMENTS decreased activity tolerance, decreased balance, decreased ROM, decreased strength, postural dysfunction, and pain.   ACTIVITY LIMITATIONS bending, sitting, standing, transfers, and locomotion level  PARTICIPATION LIMITATIONS: meal prep, community activity, and yard work  PERSONAL FACTORS Age and 1-2 comorbidities: x2 CVA with left sided weakness, chronic low back pain,  are also affecting patient's functional outcome.   REHAB POTENTIAL: Good  CLINICAL DECISION MAKING: Stable/uncomplicated  EVALUATION COMPLEXITY: Low   GOALS: Goals reviewed with patient?  yes  SHORT TERM GOALS:  Patient will be independent in self management strategies to improve quality of life and functional outcomes. Baseline: new program Target date: 12/24/2021 Goal status: INITIAL  2.  Patient will report at least 50% improvement in overall symptoms and/or function to demonstrate improved functional mobility Baseline: 0% Target date: 12/24/2021 Goal status: INITIAL  3.  Patient will be able to transition from sit to stand regularly without getting stuck: to demonstrate improved transitional mobility. Baseline: gets stuck Target date: 12/24/2021 Goal status: INITIAL        LONG TERM GOALS:  Patient will report at least 75% improvement in overall symptoms and/or function to demonstrate improved functional mobility Baseline: 0% Target date: 01/21/2022 Goal status: INITIAL  2.  Patient will report sitting with lumbar support regularly to improve siting posture and reduce  stress on lumbar spine Baseline: not performing Target date: 01/21/2022 Goal status: INITIAL  3.  Patient will be able to demonstrate at least 25% improvement in painfree lumbar ROM  Baseline: see above Target date: 01/21/2022 Goal status:  INITIAL      PLAN: PT FREQUENCY: 2x/week  PT DURATION: 8 weeks  PLANNED INTERVENTIONS: Therapeutic exercises, Therapeutic activity, Neuromuscular re-education, Balance training, Gait training, Patient/Family education, Self Care, Joint mobilization, Joint manipulation, Canalith repositioning, Orthotic/Fit training, Aquatic Therapy, Dry Needling, Electrical stimulation, Spinal manipulation, Spinal mobilization, Cryotherapy, Moist heat, Ionotophoresis '4mg'$ /ml Dexamethasone, and Manual therapy.  PLAN FOR NEXT SESSION: traction, lumbar extensions    11:18 AM, 11/26/21 [name], DPT Physical Therapy with [facility]

## 2021-11-26 ENCOUNTER — Ambulatory Visit (INDEPENDENT_AMBULATORY_CARE_PROVIDER_SITE_OTHER): Payer: Medicare Other | Admitting: Physical Therapy

## 2021-11-26 ENCOUNTER — Encounter: Payer: Self-pay | Admitting: Physical Therapy

## 2021-11-26 DIAGNOSIS — M5459 Other low back pain: Secondary | ICD-10-CM

## 2021-11-26 DIAGNOSIS — M6281 Muscle weakness (generalized): Secondary | ICD-10-CM

## 2021-11-26 MED ORDER — HYDROCODONE-ACETAMINOPHEN 5-325 MG PO TABS: 1.0000 | ORAL_TABLET | Freq: Four times a day (QID) | ORAL | 0 refills | Status: DC | PRN

## 2021-11-30 ENCOUNTER — Ambulatory Visit (INDEPENDENT_AMBULATORY_CARE_PROVIDER_SITE_OTHER): Payer: Medicare Other | Admitting: Physical Therapy

## 2021-11-30 ENCOUNTER — Encounter: Payer: Self-pay | Admitting: Physical Therapy

## 2021-11-30 DIAGNOSIS — M6281 Muscle weakness (generalized): Secondary | ICD-10-CM

## 2021-11-30 DIAGNOSIS — M5459 Other low back pain: Secondary | ICD-10-CM

## 2021-11-30 NOTE — Therapy (Signed)
OUTPATIENT PHYSICAL THERAPY TREATMENT NOTE   Patient Name: George [name]. MRN: [id number] DOB:02/22/1946, 76 y.o., male Today's Date: 11/30/2021    END OF SESSION:   PT End of Session - 11/30/21 1303     Visit Number 2    Number of Visits 16    Date for PT Re-Evaluation 01/21/22    Authorization Type medicare    Progress Note Due on Visit 10    PT Start Time 1302    PT Stop Time 1340    PT Time Calculation (min) 38 min    Activity Tolerance Patient tolerated treatment well             Past Medical History:  Diagnosis Date   Anxiety    Arthritis    OSTEO IN KNEE   Asthma    as child   Colon cancer ([place])    Depression    Elevated prostate specific antigen (PSA)    Generalized anxiety disorder 04/09/2015   Hematospermia    Hx antineoplastic chemotherapy 2010   Hypertension    Idiopathic peripheral neuropathy    TOES OF BOTH FEET DUE TO CHEMO   Incomplete bladder emptying    Malignant neoplasm of prostate ([place])    Nodular prostate with lower urinary tract symptoms    Panic attacks 04/09/2015   Primary localized osteoarthrosis of the knee, right 05/19/2016   Prostatitis    S/P total knee arthroplasty, left 05/19/2016   Spermatocele    Weak urinary stream    Past Surgical History:  Procedure Laterality Date   COLON RESECTION   NOV 2009   COLONOSCOPY     PROSTATE BIOPSY     SPERMATOCELECTOMY Left 05/07/2014   Procedure: LEFT SPERMATOCELECTOMY;  Surgeon: Malka So, MD;  [facility];  Service: Urology;  Laterality: Left;   TONSILLECTOMY     TOTAL KNEE ARTHROPLASTY Left 04/21/2015   Procedure: LEFT TOTAL KNEE ARTHROPLASTY;  Surgeon: Elsie Saas, MD;  Location: [facility];  Service: Orthopedics;  Laterality: Left;   TOTAL KNEE ARTHROPLASTY Right 05/31/2016   Procedure: TOTAL KNEE ARTHROPLASTY;  Surgeon: Elsie Saas, MD;  Location: [facility];  Service: Orthopedics;  Laterality: Right;   TRANSURETHRAL RESECTION OF PROSTATE N/A 05/07/2014   Procedure: TRANSURETHRAL  RESECTION OF THE PROSTATE WITH GYRUS INSTRUMENTS;  Surgeon: Malka So, MD;  [facility];  Service: Urology;  Laterality: N/A;   Patient Active Problem List   Diagnosis Date Noted   TIA (transient ischemic attack) 07/15/2021   Ataxia 07/13/2021   Chronic kidney disease, stage 3b ([place]) 07/13/2021   AKI (acute kidney injury) ([place])    Dyslipidemia    Essential hypertension    Hemiparesis affecting left side as late effect of stroke ([place])    Subcortical infarction ([place]) 03/05/2021   Recurrent major depressive disorder, in partial remission ([place])    CVA (cerebral vascular accident) ([place]) 02/27/2021   Atrial fibrillation with RVR ([place]) 02/27/2021   Hypokalemia 02/27/2021   QT prolongation 02/27/2021   Epistaxis, recurrent 07/04/2019   Polyneuropathy 01/31/2018   Chemotherapy-induced neuropathy ([place]) 01/31/2018   B12 deficiency 01/31/2018   Prediabetes 01/31/2018   Basal ganglia infarction ([place]) 01/31/2018   SBO (small bowel obstruction) ([place]) 10/20/2017   Deviated septum 06/28/2017   Nasal turbinate hypertrophy 06/28/2017   Rhinitis, chronic 06/28/2017   Primary localized osteoarthritis of right knee 05/31/2016   Primary localized osteoarthrosis of the knee, right 05/19/2016   S/P total knee arthroplasty, left 05/19/2016   Dyspnea 01/20/2016  DJD (degenerative joint disease) of knee 04/21/2015   Primary localized osteoarthritis of left knee 04/09/2015   Generalized anxiety disorder 04/09/2015   Panic attacks 04/09/2015   Spermatocele 05/08/2014   Benign localized hyperplasia of prostate with urinary obstruction 05/07/2014   Malignant neoplasm of prostate ([place]) 04/22/2014   PCP: Theresa Duty    REFERRING PROVIDER: Gregor Hams, MD   REFERRING DIAG: [phone] (ICD-10-CM) - Acute left-sided low back pain with left-sided sciatica   Rationale for Evaluation and Treatment Rehabilitation   THERAPY DIAG:  Other low back pain - Plan: PT plan of care cert/re-cert   Muscle  weakness (generalized) - Plan: PT plan of care cert/re-cert   ONSET DATE: 05/20/23- was riding a mower up an incline   SUBJECTIVE:                                                                                                                                                                                            SUBJECTIVE STATEMENT: 11/30/2021 States that he has been doing his exercises ad his leg is feeling better. States his right arm is now bothering him. States everything is doing fine until today and now his back is tight.  Eval: States that he had back pain for 20 years and then is stopped and then it came back and it is worse then it used to be. State he can't do much of anything. States that when he sits for 20 minutes he can't seem to get up and straighten up. States his recent stroke caused left sided weakness. States that he feels nauseous and he thinks it is because he didn't eat anything with his medications. Reports he was in a lot of pain   has been on prednisone, oxycodone     PERTINENT HISTORY:  Neuropathy, L TKA 2018, HTN, Colon cancer, anxiety, CVA 4/23 (most recent stroke), 11/22 first stroke   PAIN:  Are you having pain? Yes: NPRS scale: 3/10 Pain location: lower back  and can shoot to the left side and thigh/knee  Pain description: tightness/stiffness Aggravating factors: walking, bending Relieving factors: laying on back, medication     PRECAUTIONS: None   WEIGHT BEARING RESTRICTIONS No   FALLS:  Has patient fallen in last 6 months? No   LIVING ENVIRONMENT: Lives with: lives with their family Lives in: House/apartment Stairs: no Has following equipment at home: Environmental consultant - 2 wheeled   OCCUPATION: not working   PLOF: Independent with basic ADLs   PATIENT GOALS have less back pain, wants to get walk back and gait back, be able to sit for more than 15 minutes without difficulty  getting up     OBJECTIVE:    SCREENING FOR RED FLAGS: Bowel or bladder  incontinence: No Spinal tumors: No Cauda equina syndrome: No Compression fracture: No Abdominal aneurysm: No   COGNITION:           Overall cognitive status: Within functional limits for tasks assessed                              POSTURE: rounded shoulders, forward head, flexed trunk , and weight shift right   PALPATION: hypomobility noted in lumbar spine, pain in left hip with spring testing of L4/5 SP   LUMBAR ROM:    Active  A/PROM  eval  Flexion 75 %limited*  Extension 100% limited *  Right lateral flexion 75 %limited*  Left lateral flexion 75 %limited*  Right rotation    Left rotation     (Blank rows = not tested)            *pain                  LE Measurements       Lower Extremity Right 11/26/2021 Left 11/26/2021    A/PROM MMT A/PROM MMT  Hip Flexion          Hip Extension          Hip Abduction          Hip Adduction          Hip Internal rotation          Hip External rotation          Knee Flexion          Knee Extension          Ankle Dorsiflexion          Ankle Plantarflexion          Ankle Inversion          Ankle Eversion           (Blank rows = not tested)            * pain     LUMBAR SPECIAL TESTS:    Prone lying - felt good after 5 minutes, Repeated extensions in prone - felt good - symptoms resolved   GAIT: Distance walked: 25 feet Assistive device utilized: None Level of assistance: Modified independence Comments: uses right side, slumped,reduced step length on right        TODAY'S TREATMENT  11/30/2021 Therapeutic Exercise:    Aerobic: Supine: LTR 3 minutes, hamstring iso on ball 2 minutes  Prone: prone on elbows 5 minutes, prone lying 5 minutes, knee flexion alternating x3 1 minute bouts    Seated:    Standing: lumbar side bending at wall 6 minutes B Neuromuscular Re-education: Manual Therapy: traction - lumbar on ball 8 minutes Therapeutic Activity: Self Care: Trigger Point Dry Needling:  Modalities:         PATIENT EDUCATION:  Education details: on HEP Person educated: Patient Education method: Explanation, Media planner, and Handouts Education comprehension: verbalized understanding   HOME EXERCISE PROGRAM: [id number]   ASSESSMENT:   CLINICAL IMPRESSION: 11/30/2021 No pain noted end of session. Tolerated traction and standing side bending well. Added standing side bending to HEP. Discussed importance of daily adherence to HEP for optimal improvements with PT. Will continue with current POC as tolerated  Eval:Patient presents with lumbar pain that refers down the leg and is worse after sitting and  transitioning to standing position as he feels like he gets stuck. Patient tolerated  repeated extensions well and would benefit from continued PT to improve overall pain and function.      OBJECTIVE IMPAIRMENTS decreased activity tolerance, decreased balance, decreased ROM, decreased strength, postural dysfunction, and pain.    ACTIVITY LIMITATIONS bending, sitting, standing, transfers, and locomotion level   PARTICIPATION LIMITATIONS: meal prep, community activity, and yard work   PERSONAL FACTORS Age and 1-2 comorbidities: x2 CVA with left sided weakness, chronic low back pain,  are also affecting patient's functional outcome.    REHAB POTENTIAL: Good   CLINICAL DECISION MAKING: Stable/uncomplicated   EVALUATION COMPLEXITY: Low     GOALS: Goals reviewed with patient?  yes   SHORT TERM GOALS:   Patient will be independent in self management strategies to improve quality of life and functional outcomes. Baseline: new program Target date: 12/24/2021 Goal status: INITIAL   2.  Patient will report at least 50% improvement in overall symptoms and/or function to demonstrate improved functional mobility Baseline: 0% Target date: 12/24/2021 Goal status: INITIAL   3.  Patient will be able to transition from sit to stand regularly without getting stuck: to demonstrate improved transitional  mobility. Baseline: gets stuck Target date: 12/24/2021 Goal status: INITIAL             LONG TERM GOALS:   Patient will report at least 75% improvement in overall symptoms and/or function to demonstrate improved functional mobility Baseline: 0% Target date: 01/21/2022 Goal status: INITIAL   2.  Patient will report sitting with lumbar support regularly to improve siting posture and reduce stress on lumbar spine Baseline: not performing Target date: 01/21/2022 Goal status: INITIAL   3.  Patient will be able to demonstrate at least 25% improvement in painfree lumbar ROM  Baseline: see above Target date: 01/21/2022 Goal status: INITIAL         PLAN: PT FREQUENCY: 2x/week   PT DURATION: 8 weeks   PLANNED INTERVENTIONS: Therapeutic exercises, Therapeutic activity, Neuromuscular re-education, Balance training, Gait training, Patient/Family education, Self Care, Joint mobilization, Joint manipulation, Canalith repositioning, Orthotic/Fit training, Aquatic Therapy, Dry Needling, Electrical stimulation, Spinal manipulation, Spinal mobilization, Cryotherapy, Moist heat, Ionotophoresis '4mg'$ /ml Dexamethasone, and Manual therapy.   PLAN FOR NEXT SESSION: traction, lumbar extensions, lumbar SB, glutes strengthening.     2:14 PM, 11/30/21 [name], DPT Physical Therapy with [facility]

## 2021-12-05 ENCOUNTER — Other Ambulatory Visit: Payer: Self-pay | Admitting: Physical Medicine and Rehabilitation

## 2021-12-08 ENCOUNTER — Encounter: Payer: Self-pay | Admitting: Physical Therapy

## 2021-12-08 ENCOUNTER — Ambulatory Visit (INDEPENDENT_AMBULATORY_CARE_PROVIDER_SITE_OTHER): Payer: Medicare Other | Admitting: Physical Therapy

## 2021-12-08 DIAGNOSIS — M6281 Muscle weakness (generalized): Secondary | ICD-10-CM | POA: Diagnosis not present

## 2021-12-08 DIAGNOSIS — M5459 Other low back pain: Secondary | ICD-10-CM

## 2021-12-08 NOTE — Therapy (Addendum)
OUTPATIENT PHYSICAL THERAPY TREATMENT NOTE PHYSICAL THERAPY DISCHARGE SUMMARY  Visits from Start of Care: 3  Current functional level related to goals / functional outcomes: Unable to assess due to unplanned discharge    Remaining deficits: Unable to assess due to unplanned discharge    Education / Equipment: Unable to assess due to unplanned discharge    Patient agrees to discharge. Patient goals were not met. Patient is being discharged due to not returning since the last visit.  3:53 PM, 05/06/22 George [name], DPT Physical Therapy with [facility]    Patient Name: George [name]. MRN: [id number] DOB:1945-04-20, 76 y.o., male Today's Date: 12/08/2021    END OF SESSION:   PT End of Session - 12/08/21 1346     Visit Number 3    Number of Visits 16    Date for PT Re-Evaluation 01/21/22    Authorization Type medicare    Progress Note Due on Visit 10    PT Start Time 0258    PT Stop Time 1424    PT Time Calculation (min) 38 min    Activity Tolerance Patient tolerated treatment well             Past Medical History:  Diagnosis Date   Anxiety    Arthritis    OSTEO IN KNEE   Asthma    as child   Colon cancer ([place])    Depression    Elevated prostate specific antigen (PSA)    Generalized anxiety disorder 04/09/2015   Hematospermia    Hx antineoplastic chemotherapy 2010   Hypertension    Idiopathic peripheral neuropathy    TOES OF BOTH FEET DUE TO CHEMO   Incomplete bladder emptying    Malignant neoplasm of prostate ([place])    Nodular prostate with lower urinary tract symptoms    Panic attacks 04/09/2015   Primary localized osteoarthrosis of the knee, right 05/19/2016   Prostatitis    S/P total knee arthroplasty, left 05/19/2016   Spermatocele    Weak urinary stream    Past Surgical History:  Procedure Laterality Date   COLON RESECTION   NOV 2009   COLONOSCOPY     PROSTATE BIOPSY     SPERMATOCELECTOMY Left 05/07/2014   Procedure: LEFT  SPERMATOCELECTOMY;  Surgeon: Malka So, MD;  Location: [facility];  Service: Urology;  Laterality: Left;   TONSILLECTOMY     TOTAL KNEE ARTHROPLASTY Left 04/21/2015   Procedure: LEFT TOTAL KNEE ARTHROPLASTY;  Surgeon: Elsie Saas, MD;  Location: [facility];  Service: Orthopedics;  Laterality: Left;   TOTAL KNEE ARTHROPLASTY Right 05/31/2016   Procedure: TOTAL KNEE ARTHROPLASTY;  Surgeon: Elsie Saas, MD;  Location: [facility];  Service: Orthopedics;  Laterality: Right;   TRANSURETHRAL RESECTION OF PROSTATE N/A 05/07/2014   Procedure: TRANSURETHRAL RESECTION OF THE PROSTATE WITH GYRUS INSTRUMENTS;  Surgeon: Malka So, MD;  Location[facility];  Service: Urology;  Laterality: N/A;   Patient Active Problem List   Diagnosis Date Noted   TIA (transient ischemic attack) 07/15/2021   Ataxia 07/13/2021   Chronic kidney disease, stage 3b ([place]) 07/13/2021   AKI (acute kidney injury) ([place])    Dyslipidemia    Essential hypertension    Hemiparesis affecting left side as late effect of stroke ([place])    Subcortical infarction ([place]) 03/05/2021   Recurrent major depressive disorder, in partial remission ([place])    CVA (cerebral vascular accident) ([place]) 02/27/2021   Atrial fibrillation with RVR ([place]) 02/27/2021   Hypokalemia 02/27/2021   QT  prolongation 02/27/2021   Epistaxis, recurrent 07/04/2019   Polyneuropathy 01/31/2018   Chemotherapy-induced neuropathy ([place]) 01/31/2018   B12 deficiency 01/31/2018   Prediabetes 01/31/2018   Basal ganglia infarction ([place]) 01/31/2018   SBO (small bowel obstruction) ([place]) 10/20/2017   Deviated septum 06/28/2017   Nasal turbinate hypertrophy 06/28/2017   Rhinitis, chronic 06/28/2017   Primary localized osteoarthritis of right knee 05/31/2016   Primary localized osteoarthrosis of the knee, right 05/19/2016   S/P total knee arthroplasty, left 05/19/2016   Dyspnea 01/20/2016   DJD (degenerative joint disease) of knee 04/21/2015   Primary localized osteoarthritis of left knee  04/09/2015   Generalized anxiety disorder 04/09/2015   Panic attacks 04/09/2015   Spermatocele 05/08/2014   Benign localized hyperplasia of prostate with urinary obstruction 05/07/2014   Malignant neoplasm of prostate ([place]) 04/22/2014   PCP: Theresa Duty    REFERRING PROVIDER: Gregor Hams, MD   REFERRING DIAG: [phone] (ICD-10-CM) - Acute left-sided low back pain with left-sided sciatica   Rationale for Evaluation and Treatment Rehabilitation   THERAPY DIAG:  Other low back pain -    Muscle weakness (generalized) -    ONSET DATE: 10/16/21- was riding a mower up an incline   SUBJECTIVE:                                                                                                                                                                                            SUBJECTIVE STATEMENT: 12/08/2021 States that he hasn't been having pain for 1 week now. States he has been doing his exercises every other day. States he feels 75% better. Reports that he hasn't tried lifting anything heavy.   Eval: States that he had back pain for 20 years and then is stopped and then it came back and it is worse then it used to be. State he can't do much of anything. States that when he sits for 20 minutes he can't seem to get up and straighten up. States his recent stroke caused left sided weakness. States that he feels nauseous and he thinks it is because he didn't eat anything with his medications. Reports he was in a lot of pain   has been on prednisone, oxycodone     PERTINENT HISTORY:  Neuropathy, L TKA 2018, HTN, Colon cancer, anxiety, CVA 4/23 (most recent stroke), 11/22 first stroke   PAIN:  Are you having pain? no: NPRS scale: 0/10 Pain location: lower back  and can shoot to the left side and thigh/knee  Pain description: tightness/stiffness Aggravating factors: walking, bending Relieving factors: laying on back, medication     PRECAUTIONS: None  WEIGHT BEARING RESTRICTIONS  No   FALLS:  Has patient fallen in last 6 months? No   LIVING ENVIRONMENT: Lives with: lives with their family Lives in: House/apartment Stairs: no Has following equipment at home: Environmental consultant - 2 wheeled   OCCUPATION: not working   PLOF: Independent with basic ADLs   PATIENT GOALS have less back pain, wants to get walk back and gait back, be able to sit for more than 15 minutes without difficulty getting up     OBJECTIVE:    SCREENING FOR RED FLAGS: Bowel or bladder incontinence: No Spinal tumors: No Cauda equina syndrome: No Compression fracture: No Abdominal aneurysm: No   COGNITION:           Overall cognitive status: Within functional limits for tasks assessed                              POSTURE: rounded shoulders, forward head, flexed trunk , and weight shift right   PALPATION: hypomobility noted in lumbar spine, pain in left hip with spring testing of L4/5 SP   LUMBAR ROM:    Active  A/PROM  eval  Flexion 75 %limited*  Extension 100% limited *  Right lateral flexion 75 %limited*  Left lateral flexion 75 %limited*  Right rotation    Left rotation     (Blank rows = not tested)            *pain                  LE Measurements       Lower Extremity Right 11/26/2021 Left 11/26/2021    A/PROM MMT A/PROM MMT  Hip Flexion          Hip Extension          Hip Abduction          Hip Adduction          Hip Internal rotation          Hip External rotation          Knee Flexion          Knee Extension          Ankle Dorsiflexion          Ankle Plantarflexion          Ankle Inversion          Ankle Eversion           (Blank rows = not tested)            * pain     LUMBAR SPECIAL TESTS:    Prone lying - felt good after 5 minutes, Repeated extensions in prone - felt good - symptoms resolved   GAIT: Distance walked: 25 feet Assistive device utilized: None Level of assistance: Modified independence Comments: uses right side, slumped,reduced step  length on right        TODAY'S TREATMENT  12/08/2021 Therapeutic Exercise:    Aerobic: Supine:  Prone: prone on elbows 2 minutes, prone lying 3 minutes, knee flexion alternating 3x10 B, hip extension 3x10 B    Seated:    Standing: lumbar side bending at wall 3 minutes, standing lumbar extension at wall 3 minutes  Neuromuscular Re-education: Manual Therapy:   Therapeutic Activity: lifting mechanics - 10 minutes Self Care: Trigger Point Dry Needling:  Modalities:        PATIENT EDUCATION:  Education details: on HEP, on importance of  performing exercise daily, on return  to walking program, on increased daily activity Person educated: Patient Education method: Explanation, Demonstration, and Handouts Education comprehension: verbalized understanding   HOME EXERCISE PROGRAM: [id number]   ASSESSMENT:   CLINICAL IMPRESSION: 12/08/2021 Session focused on review of exercises. Added in lifting mechanics and education on return to walking program. Encouraged patient to start walking again as he is currently discouraged with current walking abilities and how he easily gets winded. Answered all questions and patient to continue to benefit from skilled PT as tolerated.   Eval:Patient presents with lumbar pain that refers down the leg and is worse after sitting and transitioning to standing position as he feels like he gets stuck. Patient tolerated  repeated extensions well and would benefit from continued PT to improve overall pain and function.      OBJECTIVE IMPAIRMENTS decreased activity tolerance, decreased balance, decreased ROM, decreased strength, postural dysfunction, and pain.    ACTIVITY LIMITATIONS bending, sitting, standing, transfers, and locomotion level   PARTICIPATION LIMITATIONS: meal prep, community activity, and yard work   PERSONAL FACTORS Age and 1-2 comorbidities: x2 CVA with left sided weakness, chronic low back pain,  are also affecting patient's functional  outcome.    REHAB POTENTIAL: Good   CLINICAL DECISION MAKING: Stable/uncomplicated   EVALUATION COMPLEXITY: Low     GOALS: Goals reviewed with patient?  yes   SHORT TERM GOALS:   Patient will be independent in self management strategies to improve quality of life and functional outcomes. Baseline: new program Target date: 12/24/2021 Goal status: INITIAL   2.  Patient will report at least 50% improvement in overall symptoms and/or function to demonstrate improved functional mobility Baseline: 0% Target date: 12/24/2021 Goal status: INITIAL   3.  Patient will be able to transition from sit to stand regularly without getting stuck: to demonstrate improved transitional mobility. Baseline: gets stuck Target date: 12/24/2021 Goal status: INITIAL             LONG TERM GOALS:   Patient will report at least 75% improvement in overall symptoms and/or function to demonstrate improved functional mobility Baseline: 0% Target date: 01/21/2022 Goal status: INITIAL   2.  Patient will report sitting with lumbar support regularly to improve siting posture and reduce stress on lumbar spine Baseline: not performing Target date: 01/21/2022 Goal status: INITIAL   3.  Patient will be able to demonstrate at least 25% improvement in painfree lumbar ROM  Baseline: see above Target date: 01/21/2022 Goal status: INITIAL         PLAN: PT FREQUENCY: 2x/week   PT DURATION: 8 weeks   PLANNED INTERVENTIONS: Therapeutic exercises, Therapeutic activity, Neuromuscular re-education, Balance training, Gait training, Patient/Family education, Self Care, Joint mobilization, Joint manipulation, Canalith repositioning, Orthotic/Fit training, Aquatic Therapy, Dry Needling, Electrical stimulation, Spinal manipulation, Spinal mobilization, Cryotherapy, Moist heat, Ionotophoresis '4mg'$ /ml Dexamethasone, and Manual therapy.   PLAN FOR NEXT SESSION: lifting mechanics, lumbar extensions, lumbar SB, glute  strengthening. Walking/endurance plan     2:25 PM, 12/08/21 George [name], DPT Physical Therapy with [facility]

## 2021-12-09 ENCOUNTER — Encounter: Payer: Self-pay | Admitting: Physical Therapy

## 2021-12-09 DIAGNOSIS — Z8546 Personal history of malignant neoplasm of prostate: Secondary | ICD-10-CM | POA: Diagnosis not present

## 2021-12-10 NOTE — Therapy (Deleted)
OUTPATIENT PHYSICAL THERAPY TREATMENT NOTE   Patient Name: George [name]. MRN: [id number] DOB:1946/02/14, 76 y.o., male Today's Date: 12/10/2021    END OF SESSION:     Past Medical History:  Diagnosis Date   Anxiety    Arthritis    OSTEO IN KNEE   Asthma    as child   Colon cancer ([place])    Depression    Elevated prostate specific antigen (PSA)    Generalized anxiety disorder 04/09/2015   Hematospermia    Hx antineoplastic chemotherapy 2010   Hypertension    Idiopathic peripheral neuropathy    TOES OF BOTH FEET DUE TO CHEMO   Incomplete bladder emptying    Malignant neoplasm of prostate ([place])    Nodular prostate with lower urinary tract symptoms    Panic attacks 04/09/2015   Primary localized osteoarthrosis of the knee, right 05/19/2016   Prostatitis    S/P total knee arthroplasty, left 05/19/2016   Spermatocele    Weak urinary stream    Past Surgical History:  Procedure Laterality Date   COLON RESECTION   NOV 2009   COLONOSCOPY     PROSTATE BIOPSY     SPERMATOCELECTOMY Left 05/07/2014   Procedure: LEFT SPERMATOCELECTOMY;  Surgeon: Malka So, MD;  [facility];  Service: Urology;  Laterality: Left;   TONSILLECTOMY     TOTAL KNEE ARTHROPLASTY Left 04/21/2015   Procedure: LEFT TOTAL KNEE ARTHROPLASTY;  Surgeon: Elsie Saas, MD;  Location: [facility];  Service: Orthopedics;  Laterality: Left;   TOTAL KNEE ARTHROPLASTY Right 05/31/2016   Procedure: TOTAL KNEE ARTHROPLASTY;  Surgeon: Elsie Saas, MD;  Location: [facility];  Service: Orthopedics;  Laterality: Right;   TRANSURETHRAL RESECTION OF PROSTATE N/A 05/07/2014   Procedure: TRANSURETHRAL RESECTION OF THE PROSTATE WITH GYRUS INSTRUMENTS;  Surgeon: Malka So, MD;  [facility];  Service: Urology;  Laterality: N/A;   Patient Active Problem List   Diagnosis Date Noted   TIA (transient ischemic attack) 07/15/2021   Ataxia 07/13/2021   Chronic kidney disease, stage 3b ([place]) 07/13/2021   AKI (acute kidney injury)  ([place])    Dyslipidemia    Essential hypertension    Hemiparesis affecting left side as late effect of stroke ([place])    Subcortical infarction ([place]) 03/05/2021   Recurrent major depressive disorder, in partial remission ([place])    CVA (cerebral vascular accident) ([place]) 02/27/2021   Atrial fibrillation with RVR ([place]) 02/27/2021   Hypokalemia 02/27/2021   QT prolongation 02/27/2021   Epistaxis, recurrent 07/04/2019   Polyneuropathy 01/31/2018   Chemotherapy-induced neuropathy ([place]) 01/31/2018   B12 deficiency 01/31/2018   Prediabetes 01/31/2018   Basal ganglia infarction ([place]) 01/31/2018   SBO (small bowel obstruction) ([place]) 10/20/2017   Deviated septum 06/28/2017   Nasal turbinate hypertrophy 06/28/2017   Rhinitis, chronic 06/28/2017   Primary localized osteoarthritis of right knee 05/31/2016   Primary localized osteoarthrosis of the knee, right 05/19/2016   S/P total knee arthroplasty, left 05/19/2016   Dyspnea 01/20/2016   DJD (degenerative joint disease) of knee 04/21/2015   Primary localized osteoarthritis of left knee 04/09/2015   Generalized anxiety disorder 04/09/2015   Panic attacks 04/09/2015   Spermatocele 05/08/2014   Benign localized hyperplasia of prostate with urinary obstruction 05/07/2014   Malignant neoplasm of prostate ([place]) 04/22/2014   PCP: Theresa Duty    REFERRING PROVIDER: Gregor Hams, MD   REFERRING DIAG: [phone] (ICD-10-CM) - Acute left-sided low back pain with left-sided sciatica   Rationale for Evaluation and  Treatment Rehabilitation   THERAPY DIAG:  Other low back pain -    Muscle weakness (generalized) -    ONSET DATE: 10/16/21- was riding a mower up an incline   SUBJECTIVE:                                                                                                                                                                                            SUBJECTIVE STATEMENT: 12/10/2021 States that he hasn't been having pain for 1 week  now. States he has been doing his exercises every other day. States he feels 75% better. Reports that he hasn't tried lifting anything heavy.   Eval: States that he had back pain for 20 years and then is stopped and then it came back and it is worse then it used to be. State he can't do much of anything. States that when he sits for 20 minutes he can't seem to get up and straighten up. States his recent stroke caused left sided weakness. States that he feels nauseous and he thinks it is because he didn't eat anything with his medications. Reports he was in a lot of pain   has been on prednisone, oxycodone     PERTINENT HISTORY:  Neuropathy, L TKA 2018, HTN, Colon cancer, anxiety, CVA 4/23 (most recent stroke), 11/22 first stroke   PAIN:  Are you having pain? no: NPRS scale: 0/10 Pain location: lower back  and can shoot to the left side and thigh/knee  Pain description: tightness/stiffness Aggravating factors: walking, bending Relieving factors: laying on back, medication     PRECAUTIONS: None   WEIGHT BEARING RESTRICTIONS No   FALLS:  Has patient fallen in last 6 months? No   LIVING ENVIRONMENT: Lives with: lives with their family Lives in: House/apartment Stairs: no Has following equipment at home: Environmental consultant - 2 wheeled   OCCUPATION: not working   PLOF: Independent with basic ADLs   PATIENT GOALS have less back pain, wants to get walk back and gait back, be able to sit for more than 15 minutes without difficulty getting up     OBJECTIVE:    SCREENING FOR RED FLAGS: Bowel or bladder incontinence: No Spinal tumors: No Cauda equina syndrome: No Compression fracture: No Abdominal aneurysm: No   COGNITION:           Overall cognitive status: Within functional limits for tasks assessed                              POSTURE: rounded shoulders, forward head, flexed trunk , and weight shift right  PALPATION: hypomobility noted in lumbar spine, pain in left hip with spring  testing of L4/5 SP   LUMBAR ROM:    Active  A/PROM  eval  Flexion 75 %limited*  Extension 100% limited *  Right lateral flexion 75 %limited*  Left lateral flexion 75 %limited*  Right rotation    Left rotation     (Blank rows = not tested)            *pain                  LE Measurements       Lower Extremity Right 11/26/2021 Left 11/26/2021    A/PROM MMT A/PROM MMT  Hip Flexion          Hip Extension          Hip Abduction          Hip Adduction          Hip Internal rotation          Hip External rotation          Knee Flexion          Knee Extension          Ankle Dorsiflexion          Ankle Plantarflexion          Ankle Inversion          Ankle Eversion           (Blank rows = not tested)            * pain     LUMBAR SPECIAL TESTS:    Prone lying - felt good after 5 minutes, Repeated extensions in prone - felt good - symptoms resolved   GAIT: Distance walked: 25 feet Assistive device utilized: None Level of assistance: Modified independence Comments: uses right side, slumped,reduced step length on right        TODAY'S TREATMENT  12/10/2021 Therapeutic Exercise:    Aerobic: Supine:  Prone: prone on elbows 2 minutes, prone lying 3 minutes, knee flexion alternating 3x10 B, hip extension 3x10 B    Seated:    Standing: lumbar side bending at wall 3 minutes, standing lumbar extension at wall 3 minutes  Neuromuscular Re-education: Manual Therapy:   Therapeutic Activity: lifting mechanics - 10 minutes Self Care: Trigger Point Dry Needling:  Modalities:        PATIENT EDUCATION:  Education details: on HEP, on importance of performing exercise daily, on return  to walking program, on increased daily activity Person educated: Patient Education method: Explanation, Demonstration, and Handouts Education comprehension: verbalized understanding   HOME EXERCISE PROGRAM: [id number]   ASSESSMENT:   CLINICAL IMPRESSION: 12/10/2021 Session focused on review  of exercises. Added in lifting mechanics and education on return to walking program. Encouraged patient to start walking again as he is currently discouraged with current walking abilities and how he easily gets winded. Answered all questions and patient to continue to benefit from skilled PT as tolerated.   Eval:Patient presents with lumbar pain that refers down the leg and is worse after sitting and transitioning to standing position as he feels like he gets stuck. Patient tolerated  repeated extensions well and would benefit from continued PT to improve overall pain and function.      OBJECTIVE IMPAIRMENTS decreased activity tolerance, decreased balance, decreased ROM, decreased strength, postural dysfunction, and pain.    ACTIVITY LIMITATIONS bending, sitting, standing, transfers, and locomotion level   PARTICIPATION LIMITATIONS: meal  prep, community activity, and yard work   PERSONAL FACTORS Age and 1-2 comorbidities: x2 CVA with left sided weakness, chronic low back pain,  are also affecting patient's functional outcome.    REHAB POTENTIAL: Good   CLINICAL DECISION MAKING: Stable/uncomplicated   EVALUATION COMPLEXITY: Low     GOALS: Goals reviewed with patient?  yes   SHORT TERM GOALS:   Patient will be independent in self management strategies to improve quality of life and functional outcomes. Baseline: new program Target date: 12/24/2021 Goal status: INITIAL   2.  Patient will report at least 50% improvement in overall symptoms and/or function to demonstrate improved functional mobility Baseline: 0% Target date: 12/24/2021 Goal status: INITIAL   3.  Patient will be able to transition from sit to stand regularly without getting stuck: to demonstrate improved transitional mobility. Baseline: gets stuck Target date: 12/24/2021 Goal status: INITIAL             LONG TERM GOALS:   Patient will report at least 75% improvement in overall symptoms and/or function to  demonstrate improved functional mobility Baseline: 0% Target date: 01/21/2022 Goal status: INITIAL   2.  Patient will report sitting with lumbar support regularly to improve siting posture and reduce stress on lumbar spine Baseline: not performing Target date: 01/21/2022 Goal status: INITIAL   3.  Patient will be able to demonstrate at least 25% improvement in painfree lumbar ROM  Baseline: see above Target date: 01/21/2022 Goal status: INITIAL         PLAN: PT FREQUENCY: 2x/week   PT DURATION: 8 weeks   PLANNED INTERVENTIONS: Therapeutic exercises, Therapeutic activity, Neuromuscular re-education, Balance training, Gait training, Patient/Family education, Self Care, Joint mobilization, Joint manipulation, Canalith repositioning, Orthotic/Fit training, Aquatic Therapy, Dry Needling, Electrical stimulation, Spinal manipulation, Spinal mobilization, Cryotherapy, Moist heat, Ionotophoresis '4mg'$ /ml Dexamethasone, and Manual therapy.   PLAN FOR NEXT SESSION: lifting mechanics, lumbar extensions, lumbar SB, glute strengthening. Walking/endurance plan     Rudi Heap PT, DPT 12/10/21  10:48 AM

## 2021-12-15 ENCOUNTER — Encounter: Payer: Self-pay | Admitting: Physical Therapy

## 2021-12-16 DIAGNOSIS — Z8546 Personal history of malignant neoplasm of prostate: Secondary | ICD-10-CM | POA: Diagnosis not present

## 2021-12-16 DIAGNOSIS — R351 Nocturia: Secondary | ICD-10-CM | POA: Diagnosis not present

## 2021-12-16 DIAGNOSIS — N5201 Erectile dysfunction due to arterial insufficiency: Secondary | ICD-10-CM | POA: Diagnosis not present

## 2021-12-16 DIAGNOSIS — N403 Nodular prostate with lower urinary tract symptoms: Secondary | ICD-10-CM | POA: Diagnosis not present

## 2021-12-17 ENCOUNTER — Ambulatory Visit (INDEPENDENT_AMBULATORY_CARE_PROVIDER_SITE_OTHER): Payer: Medicare Other | Admitting: Family Medicine

## 2021-12-17 VITALS — BP 150/90 | HR 61 | Ht 72.0 in | Wt 206.8 lb

## 2021-12-17 DIAGNOSIS — I63431 Cerebral infarction due to embolism of right posterior cerebral artery: Secondary | ICD-10-CM | POA: Diagnosis not present

## 2021-12-17 DIAGNOSIS — M5442 Lumbago with sciatica, left side: Secondary | ICD-10-CM

## 2021-12-17 NOTE — Patient Instructions (Addendum)
Thank you for coming in today.   Proceed to PT.   If not better we can do CT myelogram.   Recheck in 1 month.

## 2021-12-17 NOTE — Progress Notes (Signed)
   I, Peterson Lombard, LAT, ATC acting as a scribe for Lynne Leader, MD.  George [name]. is a 76 y.o. male who presents to [facility] today for f/u L-sided low back pain with pain radiating to the L thigh. Pt was last seen by Dr. Georgina Snell on 11/12/21 and was prescribed prednisone, flexeril, hydrocodone, and increased his Lyrica dose to '75mg'$ . Pt was also referred to PT, completing 3 visits. Today, pt reports improvement in his LBP. Pt notes pain had completed resolved for 1-2, then did some rigorous exercise at PT, and then LBP returned. Pt locates pain to the midline of his low back.   Dx imaging: 10/30/21 L-spine XR  Pertinent review of systems: No fevers or chills  Relevant historical information: Hypertension.  History of a stroke. History of metallic foreign body left orbit  Exam:  BP (!) 150/90   Pulse 61   Ht 6' (1.829 m)   Wt 206 lb 12.8 oz (93.8 kg)   SpO2 99%   BMI 28.05 kg/m  General: Well Developed, well nourished, and in no acute distress.   MSK: L-spine: Nontender midline.  Decreased lumbar motion.    Lab and Radiology Results  EXAM: LUMBAR SPINE - COMPLETE 4+ VIEW   COMPARISON:  None Available.   FINDINGS: Mild dextrocurvature of the lumbar spine. Lumbar vertebral body height are preserved without evidence of fracture. Minimal grade 1 anterolisthesis of L2 on L3. No spondylolysis identified. Severe intervertebral disc space narrowing at L3-L4, L4-L5 and L5-S1 with endplate sclerosis and osteophytes. Facet arthropathy most significant in the lower lumbar spine.   IMPRESSION: Advanced degenerative changes of the lumbar spine as described.     Electronically Signed   By: Ofilia Neas M.D.   On: 10/30/2021 13:40 I, Lynne Leader, personally (independently) visualized and performed the interpretation of the images attached in this note.      Assessment and Plan: 76 y.o. male with chronic left low back pain with left thigh  pain.  Majority of pain thought to be due to muscle spasm and dysfunction with some L2 or 3 lumbar radiculopathy.  Initially was doing well with physical therapy but had a setback after his most recent visit where he probably was pushed a little too hard.  I encouraged him to continue with physical therapy as ultimately I think it will be beneficial.  We were originally planning on doing an MRI today to further characterize source of pain and for potential treatment planning including injections.  However he has a metallic foreign body in his orbit or at least near his orbit which is not going to be MRI compatible and he would have to have a CT myelogram of his lumbar spine to further evaluate his lumbar spine more thoroughly.  Once I explained the mechanics of this we both agreed that it probably makes sense to wait another month and really give physical therapy a good try.  Plan to continue PT and check back in a month.  If not better consider CT myelogram lumbar spine.       Discussed warning signs or symptoms. Please see discharge instructions. Patient expresses understanding.   The above documentation has been reviewed and is accurate and complete Lynne Leader, M.D.

## 2021-12-17 NOTE — Therapy (Deleted)
OUTPATIENT PHYSICAL THERAPY TREATMENT NOTE   Patient Name: George [name]. MRN: [id number] DOB:[birth year], 76 y.o., male Today's Date: 12/17/2021    END OF SESSION:     Past Medical History:  Diagnosis Date   Anxiety    Arthritis    OSTEO IN KNEE   Asthma    as child   Colon cancer ([place])    Depression    Elevated prostate specific antigen (PSA)    Generalized anxiety disorder 04/09/2015   Hematospermia    Hx antineoplastic chemotherapy 2010   Hypertension    Idiopathic peripheral neuropathy    TOES OF BOTH FEET DUE TO CHEMO   Incomplete bladder emptying    Malignant neoplasm of prostate ([place])    Nodular prostate with lower urinary tract symptoms    Panic attacks 04/09/2015   Primary localized osteoarthrosis of the knee, right 05/19/2016   Prostatitis    S/P total knee arthroplasty, left 05/19/2016   Spermatocele    Weak urinary stream    Past Surgical History:  Procedure Laterality Date   COLON RESECTION   NOV 2009   COLONOSCOPY     PROSTATE BIOPSY     SPERMATOCELECTOMY Left 05/07/2014   Procedure: LEFT SPERMATOCELECTOMY;  Surgeon: Malka So, MD;  Location: [facility];  Service: Urology;  Laterality: Left;   TONSILLECTOMY     TOTAL KNEE ARTHROPLASTY Left 04/21/2015   Procedure: LEFT TOTAL KNEE ARTHROPLASTY;  Surgeon: Elsie Saas, MD;  Location: [facility];  Service: Orthopedics;  Laterality: Left;   TOTAL KNEE ARTHROPLASTY Right 05/31/2016   Procedure: TOTAL KNEE ARTHROPLASTY;  Surgeon: Elsie Saas, MD;  Location: [facility];  Service: Orthopedics;  Laterality: Right;   TRANSURETHRAL RESECTION OF PROSTATE N/A 05/07/2014   Procedure: TRANSURETHRAL RESECTION OF THE PROSTATE WITH GYRUS INSTRUMENTS;  Surgeon: Malka So, MD;  Location: [facility];  Service: Urology;  Laterality: N/A;   Patient Active Problem List   Diagnosis Date Noted   TIA (transient ischemic attack) 07/15/2021   Ataxia 07/13/2021   Chronic kidney disease, stage 3b ([place]) 07/13/2021   AKI (acute kidney injury)  ([place])    Dyslipidemia    Essential hypertension    Hemiparesis affecting left side as late effect of stroke ([place])    Subcortical infarction ([place]) 03/05/2021   Recurrent major depressive disorder, in partial remission ([place])    CVA (cerebral vascular accident) ([place]) 02/27/2021   Atrial fibrillation with RVR ([place]) 02/27/2021   Hypokalemia 02/27/2021   QT prolongation 02/27/2021   Epistaxis, recurrent 07/04/2019   Polyneuropathy 01/31/2018   Chemotherapy-induced neuropathy ([place]) 01/31/2018   B12 deficiency 01/31/2018   Prediabetes 01/31/2018   Basal ganglia infarction ([place]) 01/31/2018   SBO (small bowel obstruction) ([place]) 10/20/2017   Deviated septum 06/28/2017   Nasal turbinate hypertrophy 06/28/2017   Rhinitis, chronic 06/28/2017   Primary localized osteoarthritis of right knee 05/31/2016   Primary localized osteoarthrosis of the knee, right 05/19/2016   S/P total knee arthroplasty, left 05/19/2016   Dyspnea 01/20/2016   DJD (degenerative joint disease) of knee 04/21/2015   Primary localized osteoarthritis of left knee 04/09/2015   Generalized anxiety disorder 04/09/2015   Panic attacks 04/09/2015   Spermatocele 05/08/2014   Benign localized hyperplasia of prostate with urinary obstruction 05/07/2014   Malignant neoplasm of prostate ([place]) 04/22/2014   PCP: Theresa Duty    REFERRING PROVIDER: Gregor Hams, MD   REFERRING DIAG: [phone] (ICD-10-CM) - Acute left-sided low back pain with left-sided sciatica   Rationale for Evaluation and  Treatment Rehabilitation   THERAPY DIAG:  Other low back pain -    Muscle weakness (generalized) -    ONSET DATE: 10/16/21- was riding a mower up an incline   SUBJECTIVE:                                                                                                                                                                                            SUBJECTIVE STATEMENT: 12/17/2021 States that he hasn't been having pain for 1 week  now. States he has been doing his exercises every other day. States he feels 75% better. Reports that he hasn't tried lifting anything heavy.   Eval: States that he had back pain for 20 years and then is stopped and then it came back and it is worse then it used to be. State he can't do much of anything. States that when he sits for 20 minutes he can't seem to get up and straighten up. States his recent stroke caused left sided weakness. States that he feels nauseous and he thinks it is because he didn't eat anything with his medications. Reports he was in a lot of pain   has been on prednisone, oxycodone     PERTINENT HISTORY:  Neuropathy, L TKA 2018, HTN, Colon cancer, anxiety, CVA 4/23 (most recent stroke), 11/22 first stroke   PAIN:  Are you having pain? no: NPRS scale: 0/10 Pain location: lower back  and can shoot to the left side and thigh/knee  Pain description: tightness/stiffness Aggravating factors: walking, bending Relieving factors: laying on back, medication     PRECAUTIONS: None   WEIGHT BEARING RESTRICTIONS No   FALLS:  Has patient fallen in last 6 months? No   LIVING ENVIRONMENT: Lives with: lives with their family Lives in: House/apartment Stairs: no Has following equipment at home: Environmental consultant - 2 wheeled   OCCUPATION: not working   PLOF: Independent with basic ADLs   PATIENT GOALS have less back pain, wants to get walk back and gait back, be able to sit for more than 15 minutes without difficulty getting up     OBJECTIVE:    SCREENING FOR RED FLAGS: Bowel or bladder incontinence: No Spinal tumors: No Cauda equina syndrome: No Compression fracture: No Abdominal aneurysm: No   COGNITION:           Overall cognitive status: Within functional limits for tasks assessed                              POSTURE: rounded shoulders, forward head, flexed trunk , and weight shift right  PALPATION: hypomobility noted in lumbar spine, pain in left hip with spring  testing of L4/5 SP   LUMBAR ROM:    Active  A/PROM  eval  Flexion 75 %limited*  Extension 100% limited *  Right lateral flexion 75 %limited*  Left lateral flexion 75 %limited*  Right rotation    Left rotation     (Blank rows = not tested)            *pain                  LE Measurements       Lower Extremity Right 11/26/2021 Left 11/26/2021    A/PROM MMT A/PROM MMT  Hip Flexion          Hip Extension          Hip Abduction          Hip Adduction          Hip Internal rotation          Hip External rotation          Knee Flexion          Knee Extension          Ankle Dorsiflexion          Ankle Plantarflexion          Ankle Inversion          Ankle Eversion           (Blank rows = not tested)            * pain     LUMBAR SPECIAL TESTS:    Prone lying - felt good after 5 minutes, Repeated extensions in prone - felt good - symptoms resolved   GAIT: Distance walked: 25 feet Assistive device utilized: None Level of assistance: Modified independence Comments: uses right side, slumped,reduced step length on right        TODAY'S TREATMENT  12/17/2021 Therapeutic Exercise:    Aerobic: Supine:  Prone: prone on elbows 2 minutes, prone lying 3 minutes, knee flexion alternating 3x10 B, hip extension 3x10 B    Seated:    Standing: lumbar side bending at wall 3 minutes, standing lumbar extension at wall 3 minutes  Neuromuscular Re-education: Manual Therapy:   Therapeutic Activity: lifting mechanics - 10 minutes Self Care: Trigger Point Dry Needling:  Modalities:        PATIENT EDUCATION:  Education details: on HEP, on importance of performing exercise daily, on return  to walking program, on increased daily activity Person educated: Patient Education method: Explanation, Demonstration, and Handouts Education comprehension: verbalized understanding   HOME EXERCISE PROGRAM: [id number]   ASSESSMENT:   CLINICAL IMPRESSION: 12/17/2021 Session focused on review  of exercises. Added in lifting mechanics and education on return to walking program. Encouraged patient to start walking again as he is currently discouraged with current walking abilities and how he easily gets winded. Answered all questions and patient to continue to benefit from skilled PT as tolerated.   Eval:Patient presents with lumbar pain that refers down the leg and is worse after sitting and transitioning to standing position as he feels like he gets stuck. Patient tolerated  repeated extensions well and would benefit from continued PT to improve overall pain and function.      OBJECTIVE IMPAIRMENTS decreased activity tolerance, decreased balance, decreased ROM, decreased strength, postural dysfunction, and pain.    ACTIVITY LIMITATIONS bending, sitting, standing, transfers, and locomotion level   PARTICIPATION LIMITATIONS: meal  prep, community activity, and yard work   PERSONAL FACTORS Age and 1-2 comorbidities: x2 CVA with left sided weakness, chronic low back pain,  are also affecting patient's functional outcome.    REHAB POTENTIAL: Good   CLINICAL DECISION MAKING: Stable/uncomplicated   EVALUATION COMPLEXITY: Low     GOALS: Goals reviewed with patient?  yes   SHORT TERM GOALS:   Patient will be independent in self management strategies to improve quality of life and functional outcomes. Baseline: new program Target date: 12/24/2021 Goal status: INITIAL   2.  Patient will report at least 50% improvement in overall symptoms and/or function to demonstrate improved functional mobility Baseline: 0% Target date: 12/24/2021 Goal status: INITIAL   3.  Patient will be able to transition from sit to stand regularly without getting stuck: to demonstrate improved transitional mobility. Baseline: gets stuck Target date: 12/24/2021 Goal status: INITIAL             LONG TERM GOALS:   Patient will report at least 75% improvement in overall symptoms and/or function to  demonstrate improved functional mobility Baseline: 0% Target date: 01/21/2022 Goal status: INITIAL   2.  Patient will report sitting with lumbar support regularly to improve siting posture and reduce stress on lumbar spine Baseline: not performing Target date: 01/21/2022 Goal status: INITIAL   3.  Patient will be able to demonstrate at least 25% improvement in painfree lumbar ROM  Baseline: see above Target date: 01/21/2022 Goal status: INITIAL         PLAN: PT FREQUENCY: 2x/week   PT DURATION: 8 weeks   PLANNED INTERVENTIONS: Therapeutic exercises, Therapeutic activity, Neuromuscular re-education, Balance training, Gait training, Patient/Family education, Self Care, Joint mobilization, Joint manipulation, Canalith repositioning, Orthotic/Fit training, Aquatic Therapy, Dry Needling, Electrical stimulation, Spinal manipulation, Spinal mobilization, Cryotherapy, Moist heat, Ionotophoresis '4mg'$ /ml Dexamethasone, and Manual therapy.   PLAN FOR NEXT SESSION: lifting mechanics, lumbar extensions, lumbar SB, glute strengthening. Walking/endurance plan     Rudi Heap PT, DPT 12/17/21  4:06 PM

## 2021-12-18 ENCOUNTER — Encounter: Payer: Medicare Other | Admitting: Physical Medicine and Rehabilitation

## 2021-12-22 ENCOUNTER — Encounter: Payer: Self-pay | Admitting: Physical Therapy

## 2021-12-29 ENCOUNTER — Encounter: Payer: Self-pay | Admitting: Physical Therapy

## 2022-01-04 ENCOUNTER — Encounter: Payer: Self-pay | Admitting: *Deleted

## 2022-01-14 ENCOUNTER — Ambulatory Visit: Payer: Medicare Other | Admitting: Family Medicine

## 2022-01-14 DIAGNOSIS — N1832 Chronic kidney disease, stage 3b: Secondary | ICD-10-CM | POA: Diagnosis not present

## 2022-01-14 DIAGNOSIS — I129 Hypertensive chronic kidney disease with stage 1 through stage 4 chronic kidney disease, or unspecified chronic kidney disease: Secondary | ICD-10-CM | POA: Diagnosis not present

## 2022-01-14 DIAGNOSIS — N2581 Secondary hyperparathyroidism of renal origin: Secondary | ICD-10-CM | POA: Diagnosis not present

## 2022-01-14 DIAGNOSIS — D631 Anemia in chronic kidney disease: Secondary | ICD-10-CM | POA: Diagnosis not present

## 2022-01-14 LAB — BASIC METABOLIC PANEL
BUN: 18 (ref 4–21)
CO2: 20 (ref 13–22)
Chloride: 107 (ref 99–108)
Creatinine: 2.2 — AB (ref 0.6–1.3)
Glucose: 114
Potassium: 4.7 mEq/L (ref 3.5–5.1)
Sodium: 143 (ref 137–147)

## 2022-01-14 LAB — COMPREHENSIVE METABOLIC PANEL
Albumin: 4.3 (ref 3.5–5.0)
Calcium: 10 (ref 8.7–10.7)
eGFR: 30

## 2022-01-14 LAB — CBC AND DIFFERENTIAL
HCT: 41 (ref 41–53)
Hemoglobin: 14 (ref 13.5–17.5)
WBC: 7.9

## 2022-01-14 LAB — CBC: RBC: 4.79 (ref 3.87–5.11)

## 2022-01-20 DIAGNOSIS — I10 Essential (primary) hypertension: Secondary | ICD-10-CM | POA: Diagnosis not present

## 2022-01-20 DIAGNOSIS — E785 Hyperlipidemia, unspecified: Secondary | ICD-10-CM | POA: Diagnosis not present

## 2022-01-20 DIAGNOSIS — E538 Deficiency of other specified B group vitamins: Secondary | ICD-10-CM | POA: Diagnosis not present

## 2022-01-20 DIAGNOSIS — E559 Vitamin D deficiency, unspecified: Secondary | ICD-10-CM | POA: Diagnosis not present

## 2022-01-27 ENCOUNTER — Encounter: Payer: Self-pay | Admitting: Physician Assistant

## 2022-02-01 ENCOUNTER — Other Ambulatory Visit: Payer: Self-pay | Admitting: Physician Assistant

## 2022-02-01 ENCOUNTER — Other Ambulatory Visit: Payer: Self-pay

## 2022-02-09 DIAGNOSIS — F330 Major depressive disorder, recurrent, mild: Secondary | ICD-10-CM | POA: Diagnosis not present

## 2022-02-09 DIAGNOSIS — F411 Generalized anxiety disorder: Secondary | ICD-10-CM | POA: Diagnosis not present

## 2022-02-19 DIAGNOSIS — D225 Melanocytic nevi of trunk: Secondary | ICD-10-CM | POA: Diagnosis not present

## 2022-02-19 DIAGNOSIS — L821 Other seborrheic keratosis: Secondary | ICD-10-CM | POA: Diagnosis not present

## 2022-02-19 DIAGNOSIS — B078 Other viral warts: Secondary | ICD-10-CM | POA: Diagnosis not present

## 2022-03-12 ENCOUNTER — Telehealth: Payer: Self-pay | Admitting: Physician Assistant

## 2022-03-12 NOTE — Telephone Encounter (Signed)
Called patient per provider recommendations and no answer, voicemail box not set up to leave a voicemail. Per provider recommendations patient waiting until 03/24/22 or even 03/15/22 is not appropriate and patient needs to be seen at ED now.

## 2022-03-12 NOTE — Telephone Encounter (Signed)
Noted. See prior phone note.

## 2022-03-12 NOTE — Telephone Encounter (Signed)
Triage note for patient; was advised to f/u with PCP in 2 wks

## 2022-03-12 NOTE — Telephone Encounter (Signed)
Patient Name: ASHOK SAWAYA ALL Gender: Male DOB: [birth year] Age: 76 Y 79 M 6 D Return Phone Number: [phone] (Primary), [phone] (Secondary) Address: City/ State/ Zip: [place]  [address] Client [facility] Paediatric nurse, Freight forwarder- PA Contact Type Call Who Is Calling Patient / Member / Family / Caregiver Call Type Triage / Clinical Caller Name Damani Kelemen Relationship To Patient Spouse Return Phone Number [phone] (Primary) Chief Complaint BREATHING - shortness of breath or sounds breathless Reason for Call Symptomatic / Request for [place] states her husband is having trouble breathing. He is very short of breath and pains in his chest. Translation No Nurse Assessment Nurse: Patsey Berthold, RN, Roma Kayser Date/Time (Eastern Time): 03/12/2022 11:58:50 AM Confirm and document reason for call. If symptomatic, describe symptoms. ---Caller states her husband is having trouble breathing. He is very short of breath and pains in his chest. Is off and on, is up and trying to move around. SOB onset the last few months, but has only been mild tiredness and getting winded. Now is more severe in getting winded. 4 yrs ago was referred to DR for possible COPD, had a previous black spot on lung but never followed up with it. Does the patient have any new or worsening symptoms? ---Yes Will a triage be completed? ---Yes Related visit to physician within the last 2 weeks? ---No Does the PT have any chronic conditions? (i.e. diabetes, asthma, this includes High risk factors for pregnancy, etc.) ---Yes List chronic conditions. ---a fib- eliquis, HTN- metoprolol, lotrim, statin, Wellbutrin, Is this a behavioral health or substance abuse call? ---No  Guidelines Guideline Title Affirmed Question Affirmed Notes Nurse Date/Time Eilene Ghazi Time) Breathing  Difficulty [1] MILD longstanding difficulty breathing AND [2] SAME as normal Patsey Berthold, RN, Roma Kayser 03/12/2022 12:01:22 PM Disp. Time Eilene Ghazi Time) Disposition Final User 03/12/2022 11:56:48 AM Send to Urgent Minda Meo 03/12/2022 12:09:47 PM See PCP within 2 Weeks Yes Patsey Berthold RN, Roma Kayser Final Disposition 03/12/2022 12:09:47 PM See PCP within 2 Weeks Yes Patsey Berthold, RN, Melvyn Novas Disagree/Comply Comply Caller Understands Yes PreDisposition Call Doctor Care Advice Given Per Guideline SEE PCP WITHIN 2 WEEKS: * You need to be seen for this ongoing problem within the next 2 weeks. CALL BACK IF: * Severe difficulty breathing occurs * Fever more than 100.4 F (38.0 C) * You become worse CARE ADVICE given per Breathing Difficulty (Adult) guideline. * Avoid smoke or fume exposure. * Keep room temperature slightly on the cool side. GENERAL CARE ADVICE FOR BREATHING DIFFICULTY: Comments User: Diana Eves, RN Date/Time Eilene Ghazi Time): 03/12/2022 12:03:25 PM Chest Pain felt sometimes, felt to R side but not all the time. User: Diana Eves, RN Date/Time Eilene Ghazi Time): 03/12/2022 12:10:09 PM Caller would like to make an appointment. Referrals REFERRED TO PCP OFFICE

## 2022-03-12 NOTE — Telephone Encounter (Signed)
Patient has been short of breath for the last 2 weeks.- hard to catch his breath going shopping or from the car to the house.- patients SOB has been getting worse. Patient has been triaged.

## 2022-03-12 NOTE — Telephone Encounter (Signed)
Please see call notes regarding pt and advise

## 2022-03-12 NOTE — Telephone Encounter (Signed)
Noted. Patient's choice. Recommendations stay the same on my end.

## 2022-03-12 NOTE — Telephone Encounter (Signed)
Called pt wife and spoke with her and pt both, pt states pain is on right side of his chest when taking a deep breath or trying to take out the trash etc. Doesn't think it's a heart attack. Expressed possibility of PE and seriousness of needing to be seen and evaluated at ED to rule out anything serious however patient was reluctant and advised he could not tell me at this time he was going to go be seen. Pt verbalized understanding of provider recommendations and I advised pt I would let his PCP know so she was aware as well.

## 2022-03-12 NOTE — Telephone Encounter (Signed)
Access Nurse called wanting patient to be seen within 2 weeks - patient refused to come in on Monday 12/4- patient has been sch for 12/13 at 11am - awaiting full notes

## 2022-03-24 ENCOUNTER — Ambulatory Visit: Payer: Medicare Other | Admitting: Physician Assistant

## 2022-03-25 ENCOUNTER — Encounter: Payer: Self-pay | Admitting: Physician Assistant

## 2022-03-25 ENCOUNTER — Encounter: Payer: Self-pay | Admitting: *Deleted

## 2022-04-20 DIAGNOSIS — B350 Tinea barbae and tinea capitis: Secondary | ICD-10-CM | POA: Diagnosis not present

## 2022-04-20 DIAGNOSIS — L821 Other seborrheic keratosis: Secondary | ICD-10-CM | POA: Diagnosis not present

## 2022-04-21 DIAGNOSIS — I10 Essential (primary) hypertension: Secondary | ICD-10-CM | POA: Diagnosis not present

## 2022-04-21 DIAGNOSIS — I482 Chronic atrial fibrillation, unspecified: Secondary | ICD-10-CM | POA: Diagnosis not present

## 2022-04-21 DIAGNOSIS — E785 Hyperlipidemia, unspecified: Secondary | ICD-10-CM | POA: Diagnosis not present

## 2022-04-21 DIAGNOSIS — I6340 Cerebral infarction due to embolism of unspecified cerebral artery: Secondary | ICD-10-CM | POA: Diagnosis not present

## 2022-04-30 ENCOUNTER — Ambulatory Visit: Payer: Medicare Other | Admitting: Physician Assistant

## 2022-05-05 ENCOUNTER — Other Ambulatory Visit: Payer: Self-pay | Admitting: Physician Assistant

## 2022-05-06 ENCOUNTER — Telehealth: Payer: Self-pay | Admitting: Physician Assistant

## 2022-05-06 NOTE — Telephone Encounter (Signed)
Returned call and advised not received a fax as of to date and was advised they would resend at my attention. Confirmed fax number

## 2022-05-06 NOTE — Telephone Encounter (Signed)
Caller was calling to confirm if PCP got the requisition form for neurologic screening that she faxed in yesterday. Caller requests a callback at [phone]

## 2022-05-07 ENCOUNTER — Ambulatory Visit (INDEPENDENT_AMBULATORY_CARE_PROVIDER_SITE_OTHER): Payer: Medicare Other | Admitting: Physician Assistant

## 2022-05-07 ENCOUNTER — Encounter: Payer: Self-pay | Admitting: Physician Assistant

## 2022-05-07 VITALS — BP 134/86 | HR 57 | Temp 97.1°F | Ht 72.0 in | Wt 212.2 lb

## 2022-05-07 DIAGNOSIS — Z23 Encounter for immunization: Secondary | ICD-10-CM | POA: Diagnosis not present

## 2022-05-07 DIAGNOSIS — F411 Generalized anxiety disorder: Secondary | ICD-10-CM

## 2022-05-07 DIAGNOSIS — I10 Essential (primary) hypertension: Secondary | ICD-10-CM | POA: Diagnosis not present

## 2022-05-07 DIAGNOSIS — Z85038 Personal history of other malignant neoplasm of large intestine: Secondary | ICD-10-CM | POA: Diagnosis not present

## 2022-05-07 DIAGNOSIS — N1832 Chronic kidney disease, stage 3b: Secondary | ICD-10-CM | POA: Diagnosis not present

## 2022-05-07 NOTE — Assessment & Plan Note (Signed)
Stable, doing well, follows with psych. Takes Wellbutrin XL 450 mg daily. Atarax 10 mg as needed.

## 2022-05-07 NOTE — Assessment & Plan Note (Signed)
Following with nephrology, Dr. Posey Pronto

## 2022-05-07 NOTE — Assessment & Plan Note (Signed)
Normotensive, stable, will cont on spironolactone 25 mg, Toprol XL 25 mg, Cozaar 100 mg

## 2022-05-07 NOTE — Telephone Encounter (Signed)
Fax received and given to provider to discuss with pt at today's appt

## 2022-05-07 NOTE — Progress Notes (Unsigned)
Subjective:    Patient ID: George Oto., male    DOB: 06/06/1945, 77 y.o.   MRN: [id number]  Chief Complaint  Patient presents with   Follow-up    Pt in office for 6 mon f/u; pt is doing well states no concerns to mention;     HPI Patient is in today for 6 month f/up. Here with his wife. States he's not in any pain today and very thankful to be feeling so well right now. No new concerns. No hospitalizations or ED visits in interim hx. See A/P.   Past Medical History:  Diagnosis Date   Anxiety    Arthritis    OSTEO IN KNEE   Asthma    as child   Colon cancer ([place])    Depression    Elevated prostate specific antigen (PSA)    Generalized anxiety disorder 04/09/2015   Hematospermia    Hx antineoplastic chemotherapy 2010   Hypertension    Idiopathic peripheral neuropathy    TOES OF BOTH FEET DUE TO CHEMO   Incomplete bladder emptying    Malignant neoplasm of prostate ([place])    Nodular prostate with lower urinary tract symptoms    Panic attacks 04/09/2015   Primary localized osteoarthrosis of the knee, right 05/19/2016   Prostatitis    S/P total knee arthroplasty, left 05/19/2016   Spermatocele    Weak urinary stream     Past Surgical History:  Procedure Laterality Date   COLON RESECTION   NOV 2009   COLONOSCOPY     PROSTATE BIOPSY     SPERMATOCELECTOMY Left 05/07/2014   Procedure: LEFT SPERMATOCELECTOMY;  Surgeon: Malka So, MD;  Location: [facility];  Service: Urology;  Laterality: Left;   TONSILLECTOMY     TOTAL KNEE ARTHROPLASTY Left 04/21/2015   Procedure: LEFT TOTAL KNEE ARTHROPLASTY;  Surgeon: Elsie Saas, MD;  Location: [facility];  Service: Orthopedics;  Laterality: Left;   TOTAL KNEE ARTHROPLASTY Right 05/31/2016   Procedure: TOTAL KNEE ARTHROPLASTY;  Surgeon: Elsie Saas, MD;  Location: [facility];  Service: Orthopedics;  Laterality: Right;   TRANSURETHRAL RESECTION OF PROSTATE N/A 05/07/2014   Procedure: TRANSURETHRAL RESECTION OF THE PROSTATE WITH GYRUS INSTRUMENTS;   Surgeon: Malka So, MD;  Location[facility];  Service: Urology;  Laterality: N/A;    Family History  Problem Relation Age of Onset   Stroke Father        age 63   Cancer Brother        neoplasm of brain    Social History   Tobacco Use   Smoking status: Former    Packs/day: 0.25    Years: 3.00    Total pack years: 0.75    Types: Cigarettes    Quit date: 04/12/1972    Years since quitting: 50.1   Smokeless tobacco: Never   Tobacco comments:    smoked 1 cigarette per day SOME DAYS  Vaping Use   Vaping Use: Never used  Substance Use Topics   Alcohol use: Yes    Alcohol/week: 1.0 standard drink of alcohol    Types: 1 Cans of beer per week    Comment: OCCASIONAL   Drug use: No     Allergies  Allergen Reactions   Amlodipine Other (See Comments)    Dizziness    Gabapentin     hallucinations    Review of Systems NEGATIVE UNLESS OTHERWISE INDICATED IN HPI      Objective:     BP 134/86 (BP Location:  Right Arm)   Pulse (!) 57   Temp (!) 97.1 F (36.2 C) (Temporal)   Ht 6' (1.829 m)   Wt 212 lb 3.2 oz (96.3 kg)   SpO2 99%   BMI 28.78 kg/m   Wt Readings from Last 3 Encounters:  05/07/22 212 lb 3.2 oz (96.3 kg)  12/17/21 206 lb 12.8 oz (93.8 kg)  11/12/21 205 lb (93 kg)    BP Readings from Last 3 Encounters:  05/07/22 134/86  12/17/21 (!) 150/90  11/12/21 112/80     Physical Exam Vitals and nursing note reviewed.  Constitutional:      General: He is not in acute distress.    Appearance: Normal appearance. He is not ill-appearing.  HENT:     Head: Normocephalic.  Eyes:     Extraocular Movements: Extraocular movements intact.     Conjunctiva/sclera: Conjunctivae normal.     Pupils: Pupils are equal, round, and reactive to light.  Cardiovascular:     Rate and Rhythm: Normal rate and regular rhythm.     Pulses: Normal pulses.     Heart sounds: Normal heart sounds. No murmur heard. Pulmonary:     Effort: Pulmonary effort is normal. No respiratory  distress.     Breath sounds: Normal breath sounds. No wheezing.  Musculoskeletal:     Cervical back: Normal range of motion.  Skin:    General: Skin is warm.  Neurological:     Mental Status: He is alert and oriented to person, place, and time.  Psychiatric:        Mood and Affect: Mood normal.        Behavior: Behavior normal.        Assessment & Plan:  Essential hypertension Assessment & Plan: Normotensive, stable, will cont on spironolactone 25 mg, Toprol XL 25 mg, Cozaar 100 mg    Chronic kidney disease, stage 3b (HCC) Assessment & Plan: Following with nephrology, Dr. Posey Pronto    History of colon cancer Assessment & Plan: Pt unsure of last colonoscopy, but thinks he is due. I will send referral to [facility] today. No changes in stool. No pain. No other symptoms.   Orders: -     Ambulatory referral to Gastroenterology  Generalized anxiety disorder Assessment & Plan: Stable, doing well, follows with psych. Takes Wellbutrin XL 450 mg daily. Atarax 10 mg as needed.    Need for prophylactic vaccination against Streptococcus pneumoniae (pneumococcus) -     Pneumococcal conjugate vaccine 20-valent      Return in about 6 months (around 11/05/2022) for recheck .    Keandre Linden M Ia Leeb, PA-C

## 2022-05-07 NOTE — Assessment & Plan Note (Signed)
Pt unsure of last colonoscopy, but thinks he is due. I will send referral to [facility] today. No changes in stool. No pain. No other symptoms.

## 2022-05-10 DIAGNOSIS — F411 Generalized anxiety disorder: Secondary | ICD-10-CM | POA: Diagnosis not present

## 2022-05-10 DIAGNOSIS — F330 Major depressive disorder, recurrent, mild: Secondary | ICD-10-CM | POA: Diagnosis not present

## 2022-05-13 NOTE — Telephone Encounter (Signed)
Caller is calling for an update on form. Would like to know if it can be faxed back today. Fax number is [phone].

## 2022-05-14 NOTE — Telephone Encounter (Signed)
Spoke with pt about this at appt and they are unaware of this and appears to be a scam; per provider and patient request this will not be completed

## 2022-05-20 DIAGNOSIS — E785 Hyperlipidemia, unspecified: Secondary | ICD-10-CM | POA: Diagnosis not present

## 2022-05-20 DIAGNOSIS — I6340 Cerebral infarction due to embolism of unspecified cerebral artery: Secondary | ICD-10-CM | POA: Diagnosis not present

## 2022-05-20 DIAGNOSIS — I482 Chronic atrial fibrillation, unspecified: Secondary | ICD-10-CM | POA: Diagnosis not present

## 2022-05-20 DIAGNOSIS — I10 Essential (primary) hypertension: Secondary | ICD-10-CM | POA: Diagnosis not present

## 2022-06-07 DIAGNOSIS — F411 Generalized anxiety disorder: Secondary | ICD-10-CM | POA: Diagnosis not present

## 2022-06-07 DIAGNOSIS — F330 Major depressive disorder, recurrent, mild: Secondary | ICD-10-CM | POA: Diagnosis not present

## 2022-06-10 ENCOUNTER — Encounter: Payer: Self-pay | Admitting: Adult Health

## 2022-06-10 ENCOUNTER — Ambulatory Visit (INDEPENDENT_AMBULATORY_CARE_PROVIDER_SITE_OTHER): Payer: Medicare Other | Admitting: Adult Health

## 2022-06-10 VITALS — BP 167/110 | HR 69 | Ht 72.0 in | Wt 210.0 lb

## 2022-06-10 DIAGNOSIS — G4700 Insomnia, unspecified: Secondary | ICD-10-CM

## 2022-06-10 DIAGNOSIS — G4719 Other hypersomnia: Secondary | ICD-10-CM | POA: Diagnosis not present

## 2022-06-10 DIAGNOSIS — G4752 REM sleep behavior disorder: Secondary | ICD-10-CM | POA: Diagnosis not present

## 2022-06-10 DIAGNOSIS — Z8673 Personal history of transient ischemic attack (TIA), and cerebral infarction without residual deficits: Secondary | ICD-10-CM | POA: Diagnosis not present

## 2022-06-10 DIAGNOSIS — G620 Drug-induced polyneuropathy: Secondary | ICD-10-CM

## 2022-06-10 NOTE — Progress Notes (Signed)
[facility] [address] [place]. [place] [address] [phone]       OFFICE FOLLOW UP NOTE  George [name]. Date of Birth:  1945/12/01 Medical Record Number:  [id number]   Reason for visit: Sleep concerns    SUBJECTIVE:   CHIEF COMPLAINT:  Chief Complaint  Patient presents with   Follow-up    Patient in room #3 and wife in the lobby. Pt states he has been sleeping well, due to his nite mares. Pt states he wakes up confuse and having some hallucination.    HPI:   Update 06/10/2022 JM: Patient returns for new concerns.   Concerns primarily regarding his sleep.  He does have chronic issues with insomnia that has been gradually worsening over the past 6 months and over the past few months, has been having increased issues with night terrors, acting out dreams and talking during sleep, waking up initially feeling confused (not knowing where he is at first) but quickly resolves, and at times feels like he wakes up in the middle the night and will see people or objects that are not really there.  He does wake up feeling groggy and is always feeling fatigued.  Denies snoring, witnessed apneas, nocturia or headaches.  Denies previously undergoing sleep study.  No other focal neurological deficits or associated symptoms.  Denies any cognitive difficulties or memory loss concerns.  He also mentions ongoing neuropathy which has been present since 2009 post chemo.  Symptoms worse at night and questions if this is also interfering with sleep.  He was previously seen by Dr. Jaynee Eagles in 2019, mentioned has tried gabapentin in the past but had weird dreams therefore recommended trial of Lyrica and to f/u with PCP for further treatment/management. Denies any worsening of these symptoms, just persistent and bothersome. Not currently on medication management.    Of note, he was previously seen in office over 1 year ago which was for hospital follow-up post right PCA stroke visual  left hemiparesis and left hemianopia. Per epic review, he was diagnosed with possible cerebellar infarct likely secondary to embolic source in setting of AF on Eliquis in 07/2021 after presenting with left-sided ataxia.  CT head no acute abnormality, encephalomalacia in right MCA and PCA territories since 02/2021.  Repeat CTH no acute abnormality.  CTA head/neck moderate irregularity of right PCA P2.  Unable to obtain MRI due to metal fragment in orbit.  EF 60 to 65%.  EF 60 to 65%.  LDL 58.  A1c 5.4.  Recommended ongoing use of Eliquis and atorvastatin.  Left-sided ataxia resolved prior to discharge.  He has been stable since that time without any new stroke/TIA symptoms.  Does have residual mild left visual loss, denies left sided weakness.  Remains on Eliquis and atorvastatin.  Blood pressure well-controlled at home, elevated today but typically increases at appointments and recently started taking a cold medicine.  Routinely follows with PCP.       History provided for reference purposes only Initial visit 04/30/2021 JM: [name] is being seen for initial hospital follow-up accompanied by his wife.  He was discharged home from CIR on 03/14/2021. Reports residual left peripheral visual impairment, decreased left hand dexterity and c/o left thigh pain/cramping at night but denies residual weakness. Would like to start outpatient therapies. Plans to f/u with ophthalmology.  Denies new stroke/TIA symptoms.  Compliant on Eliquis and atorvastatin without side effects.  Blood pressure today 130/84.  No further concerns at this time.  Stroke  admission 02/27/2021: George [name]. is a 77 y.o. male with history of 77 y.o. male with history h/o colon cancer s/p resection, prostate cancer, anxiety/depression, osteoarthritis of bilateral knees who presented on 02/27/2021 with complaints of persistent headaches and left-sided weakness over past 4 days since receiving flu vaccine on 11/14.  Personally reviewed  hospitalization pertinent progress notes, lab work and imaging.  Evaluated by Dr. Leonie Man for subacute right PCA infarct, embolic secondary to new onset A. fib.  CTA head/neck showed possible focal occlusion of P2 branch otherwise unremarkable.  Evidence of old right parietal lobe stroke on imaging.  Unable to complete MRI due to metal in eye.  EF 60 to 65%.  EKG showed evidence of A. Fib -placed on Eliquis 5 mg twice daily.  LDL 58.  A1c 5.4.  Advised continuation of atorvastatin at discharge. Per exam, dense left homonymous hemianopia and mild LLE weakness.  Therapy eval's recommended CIR for functional decline.     PERTINENT IMAGING  Per hospitalization 02/27/2021 CTH and CTA head & neck  1. Subacute right PCA territorial infarct with probable small foci of petechial hemorrhage. 2. Possible focal occlusion of a P2 branch at the level of the ambient cistern, though the PCA is identified within the hypodense right occipital lobe. 3. Otherwise, patent vasculature of the head and neck with no significant atherosclerotic disease. 4. Small remote infarct in the right parietal lobe MRI  Unable to obtain due to metal in eye 2D Echo 60 to 65% EKG with A Fib LDL 58 HgbA1c 5.4       ROS:   14 system review of systems performed and negative with exception of those listed in HPI  PMH:  Past Medical History:  Diagnosis Date   Anxiety    Arthritis    OSTEO IN KNEE   Asthma    as child   Colon cancer ([place])    Depression    Elevated prostate specific antigen (PSA)    Generalized anxiety disorder 04/09/2015   Hematospermia    Hx antineoplastic chemotherapy 2010   Hypertension    Idiopathic peripheral neuropathy    TOES OF BOTH FEET DUE TO CHEMO   Incomplete bladder emptying    Malignant neoplasm of prostate ([place])    Nodular prostate with lower urinary tract symptoms    Panic attacks 04/09/2015   Primary localized osteoarthrosis of the knee, right 05/19/2016   Prostatitis    S/P total  knee arthroplasty, left 05/19/2016   Spermatocele    Weak urinary stream     PSH:  Past Surgical History:  Procedure Laterality Date   COLON RESECTION   NOV 2009   COLONOSCOPY     PROSTATE BIOPSY     SPERMATOCELECTOMY Left 05/07/2014   Procedure: LEFT SPERMATOCELECTOMY;  Surgeon: Malka So, MD;  [facility];  Service: Urology;  Laterality: Left;   TONSILLECTOMY     TOTAL KNEE ARTHROPLASTY Left 04/21/2015   Procedure: LEFT TOTAL KNEE ARTHROPLASTY;  Surgeon: Elsie Saas, MD;  Location: [facility];  Service: Orthopedics;  Laterality: Left;   TOTAL KNEE ARTHROPLASTY Right 05/31/2016   Procedure: TOTAL KNEE ARTHROPLASTY;  Surgeon: Elsie Saas, MD;  Location: [facility];  Service: Orthopedics;  Laterality: Right;   TRANSURETHRAL RESECTION OF PROSTATE N/A 05/07/2014   Procedure: TRANSURETHRAL RESECTION OF THE PROSTATE WITH GYRUS INSTRUMENTS;  Surgeon: Malka So, MD;  Location[facility];  Service: Urology;  Laterality: N/A;    Social History:  Social History   Socioeconomic  History   Marital status: Married    Spouse name: Not on file   Number of children: 9   Years of education: Not on file   Highest education level: Not on file  Occupational History   Occupation: retire  Tobacco Use   Smoking status: Former    Packs/day: 0.25    Years: 3.00    Total pack years: 0.75    Types: Cigarettes    Quit date: 04/12/1972    Years since quitting: 50.1   Smokeless tobacco: Never   Tobacco comments:    smoked 1 cigarette per day SOME DAYS  Vaping Use   Vaping Use: Never used  Substance and Sexual Activity   Alcohol use: Yes    Alcohol/week: 1.0 standard drink of alcohol    Types: 1 Cans of beer per week    Comment: OCCASIONAL   Drug use: No   Sexual activity: Yes  Other Topics Concern   Not on file  Social History Narrative   Shawnie Dapper will take care of him after his surgery    Her cell is [phone]   Social Determinants of Health   Financial Resource Strain: Low Risk   (08/18/2021)   Overall Financial Resource Strain (CARDIA)    Difficulty of Paying Living Expenses: Not hard at all  Food Insecurity: No Food Insecurity (08/18/2021)   Hunger Vital Sign    Worried About Running Out of Food in the Last Year: Never true    [place] in the Last Year: Never true  Transportation Needs: No Transportation Needs (08/18/2021)   PRAPARE - Hydrologist (Medical): No    Lack of Transportation (Non-Medical): No  Physical Activity: Insufficiently Active (08/18/2021)   Exercise Vital Sign    Days of Exercise per Week: 3 days    Minutes of Exercise per Session: 30 min  Stress: No Stress Concern Present (08/18/2021)   [place]    Feeling of Stress : Not at all  Social Connections: Moderately Isolated (08/18/2021)   Social Connection and Isolation Panel [NHANES]    Frequency of Communication with Friends and Family: Once a week    Frequency of Social Gatherings with Friends and Family: Twice a week    Attends Religious Services: Never    Marine scientist or Organizations: No    Attends Archivist Meetings: Never    Marital Status: Married  Human resources officer Violence: Not At Risk (08/18/2021)   Humiliation, Afraid, Rape, and Kick questionnaire    Fear of Current or Ex-Partner: No    Emotionally Abused: No    Physically Abused: No    Sexually Abused: No    Family History:  Family History  Problem Relation Age of Onset   Stroke Father        age 69   Cancer Brother        neoplasm of brain    Medications:   Current Outpatient Medications on File Prior to Visit  Medication Sig Dispense Refill   acetaminophen (TYLENOL) 500 MG tablet Take 500 mg by mouth every 6 (six) hours as needed for mild pain or headache.     amiodarone (PACERONE) 200 MG tablet Take 200 mg by mouth daily.     atorvastatin (LIPITOR) 20 MG tablet Take 20 mg by mouth daily.     buPROPion  (WELLBUTRIN XL) 150 MG 24 hr tablet Take 450 mg by mouth every  morning.      ELIQUIS 5 MG TABS tablet Take 1 tablet by mouth twice daily 180 tablet 0   hydrOXYzine (ATARAX) 10 MG tablet TAKE 1 TABLET BY MOUTH EVERY 6 HOURS AS NEEDED FOR ANXIETY OR  ITCHING 120 tablet 0   losartan (COZAAR) 100 MG tablet Take 100 mg by mouth daily.     metoprolol succinate (TOPROL-XL) 25 MG 24 hr tablet TAKE 5 TABLETS BY MOUTH ONCE DAILY 150 tablet 0   Multiple Vitamin (MULTIVITAMIN WITH MINERALS) TABS tablet Take 1 tablet by mouth daily.     spironolactone (ALDACTONE) 25 MG tablet Take 1 tablet (25 mg total) by mouth daily. 30 tablet 11   vitamin B-12 1000 MCG tablet Take 1 tablet (1,000 mcg total) by mouth daily.     No current facility-administered medications on file prior to visit.    Allergies:   Allergies  Allergen Reactions   Amlodipine Other (See Comments)    Dizziness    Gabapentin     hallucinations      OBJECTIVE:  Physical Exam  Vitals:   06/10/22 0826  BP: (!) 167/110  Pulse: 69  Weight: 210 lb (95.3 kg)  Height: 6' (1.829 m)   Body mass index is 28.48 kg/m. No results found.  General: well developed, well nourished, very pleasant elderly African-American male, seated, in no evident distress Head: head normocephalic and atraumatic.   Neck: supple with no carotid or supraclavicular bruits Cardiovascular: regular rate and rhythm, no murmurs Musculoskeletal: no deformity Skin:  no rash/petichiae Vascular:  Normal pulses all extremities   Neurologic Exam Mental Status: Awake and fully alert.  Fluent speech and language.  Oriented to place and time. Recent and remote memory intact. Attention span, concentration and fund of knowledge appropriate. Mood and affect appropriate.  Cranial Nerves: Pupils equal, briskly reactive to light. Extraocular movements full without nystagmus. Visual fields partial left homonymous upper quadrantanopia. Hearing intact. Facial sensation intact.  Face, tongue, palate moves normally and symmetrically.  Motor: Normal bulk and tone. Normal strength in all tested extremity muscles. No evidence of resting, action or postural tremor on exam.  Unable to appreciate increased tone or rigidity. Coordination: Rapid alternating movements normal in all extremities. Finger-to-nose and heel-to-shin performed accurately bilaterally. Gait and Station: Arises from chair without difficulty. Stance is normal. Gait demonstrates normal stride length although slightly decreased LLE step height without use of AD (chronic post stroke).  No evidence of shuffling. Good arm swing bilaterally.  Reflexes: 1+ and symmetric. Toes downgoing.         ASSESSMENT: George [name]. is a 77 y.o. year old male with subacute right PCA infarct, embolic secondary to new onset A. fib on 02/27/2021 and possible right cerebellar infarct not seen on imaging (unable to obtain MRI) likely from A fib in 07/2021. Vascular risk factors include A. fib on Eliquis, HTN, HLD, prior strokes on imaging and advanced age.  Returns today, 06/10/2022, with multiple sleep-related concerns and persistent chronic post chemo neuropathy.     PLAN:  Sleep related concerns: Referral placed to [facility] sleep clinic for further evaluation for underlying sleep disorder.  Possible underlying REM sleep disorder contributing.  He also complains of chronic insomnia and excessive daytime fatigue.  Recommended trialing melatonin 2 mg nightly and gradual increase as needed up to 10 mg nightly.  Neuro exam intact.  Unable to appreciate any parkinsonian features on exam, denies memory loss concerns.   Neuropathy: Chronic post chemo, denies progression, previously seen by  Dr. Jaynee Eagles 2019, has trialed gabapentin and pregabalin in the past. Discussed chronic nature of this condition and no cure, main treatment is focused on symptom management which can be done by PCP. Would recommend he complete further work up as noted above  prior to trialing any new medications  Possible right cerebellar infarct Right PCA stroke:  Residual deficit: Left homonymous upper quadrantanopsia.  Continue Eliquis (apixaban) daily  and atorvastatin 20 mg daily for secondary stroke prevention.   Continue to follow with cardiology for atrial fibrillation and Eliquis management Discussed secondary stroke prevention measures and importance of close PCP follow up for aggressive stroke risk factor management including BP goal<130/90, and HLD with LDL goal<70  I have gone over the pathophysiology of stroke, warning signs and symptoms, risk factors and their management in some detail with instructions to go to the closest emergency room for symptoms of concern.      CC:  PCP: Allwardt, Alyssa M, PA-C    I spent 34 minutes of face-to-face and non-face-to-face time with patient.  This included previsit chart review including review of recent hospitalization, lab review, study review, order entry, electronic health record documentation, patient education and discussion regarding above diagnoses and treatment plan and answered all other questions to patient and wife satisfaction  Frann Rider, AGNP-BC  [facility] [address] [place], [place] [address]  Phone [phone] Fax [phone] Note: This document was prepared with digital dictation and possible smart phrase technology. Any transcriptional errors that result from this process are unintentional.

## 2022-06-10 NOTE — Patient Instructions (Addendum)
Recommend trying melatonin '2mg'$  nightly - can gradually increase as needed up to '10mg'$  nightly - take around sunset each night  You will be referred to see one of our sleep specialist for further evaluation   Please follow up with PCP regarding chronic neuropathy and further treatment options but would request we further evaluate your sleep related concerns prior to starting any new medications

## 2022-06-14 ENCOUNTER — Encounter: Payer: Self-pay | Admitting: Occupational Therapy

## 2022-06-14 NOTE — Therapy (Unsigned)
[facility] [address] [place], [place], [address] Phone: [phone]   Fax:  [phone]  Patient Details  Name: George [name]. MRN: [id number] Date of Birth: 12/16/1945 Referring Provider:  No ref. provider found  Encounter Date: 06/14/2022  Pt did not return after 3rd visit on 05/27/21, therefore will d/c episode of care at this time.  Pt did not meet any goals  Hans Eden, OT 06/14/2022, 8:36 AM  [facility] [address] [place], [place], [address] Phone: [phone]   Fax:  [phone]

## 2022-06-15 ENCOUNTER — Ambulatory Visit: Payer: Medicare Other | Admitting: Physician Assistant

## 2022-06-22 ENCOUNTER — Ambulatory Visit (INDEPENDENT_AMBULATORY_CARE_PROVIDER_SITE_OTHER): Payer: Medicare Other | Admitting: Neurology

## 2022-06-22 ENCOUNTER — Encounter: Payer: Self-pay | Admitting: Neurology

## 2022-06-22 VITALS — BP 129/84 | HR 53 | Ht 72.0 in | Wt 208.0 lb

## 2022-06-22 DIAGNOSIS — R0683 Snoring: Secondary | ICD-10-CM | POA: Diagnosis not present

## 2022-06-22 DIAGNOSIS — G4752 REM sleep behavior disorder: Secondary | ICD-10-CM

## 2022-06-22 DIAGNOSIS — Z8673 Personal history of transient ischemic attack (TIA), and cerebral infarction without residual deficits: Secondary | ICD-10-CM | POA: Diagnosis not present

## 2022-06-22 DIAGNOSIS — Z8679 Personal history of other diseases of the circulatory system: Secondary | ICD-10-CM

## 2022-06-22 DIAGNOSIS — R6889 Other general symptoms and signs: Secondary | ICD-10-CM

## 2022-06-22 DIAGNOSIS — G4750 Parasomnia, unspecified: Secondary | ICD-10-CM | POA: Diagnosis not present

## 2022-06-22 NOTE — Patient Instructions (Signed)
Thank you for choosing [facility] for your sleep related care! It was nice to meet you today!   Here is what we discussed today:    As discussed, we will proceed with a sleep study to look at your sleep at night and to see if you have evidence of dream enactment behavior.  The sleep study will also help determine whether you do or do not have OSA and how severe it is. Even, if you have mild OSA, I may want you to consider treatment with CPAP, as treatment of even borderline or mild sleep apnea can result and improvement of symptoms such as sleep disruption, daytime sleepiness, nighttime bathroom breaks, restless leg symptoms, improvement of headache syndromes, even improved mood disorder.   As explained, an attended sleep study (meaning you get to stay overnight in the sleep lab), lets [place] monitor sleep-related behaviors such as sleep talking and leg movements in sleep, in addition to monitoring for sleep apnea.  A home sleep test is a screening tool for sleep apnea diagnosis only, but unfortunately, does not help with any other sleep-related diagnoses.  Please remember, the long-term risks and ramifications of untreated moderate to severe obstructive sleep apnea may include (but are not limited to): increased risk for cardiovascular disease, including congestive heart failure, stroke, difficult to control hypertension, treatment resistant obesity, arrhythmias, especially irregular heartbeat commonly known as A. Fib. (atrial fibrillation); even type 2 diabetes has been linked to untreated OSA.   We will keep it as to your sleep test results by phone call and plan to follow-up in sleep clinic accordingly.

## 2022-06-22 NOTE — Progress Notes (Signed)
Subjective:    Patient ID: George [name]. is a 77 y.o. male.  HPI    Star Age, MD, PhD [facility] [address], [address] [place], [place] [address]  Dear Janett Billow and Berta Minor,  I saw your patient, George [name], upon your kind request in my sleep clinic today for initial consultation of his sleep disorder, in particular, concern for sleep disruption, daytime somnolence as well as parasomnias.  The patient is accompanied by his wife today.  As you know, George [name] is a 77 year old male with an underlying complex medical history of colon cancer, status post resection, hypertension, peripheral neuropathy, asthma, anxiety, depression, prostatitis, prostate cancer with status post prostatectomy, history of right PCA stroke in November 2022 with residual left hemiparesis and left hemianopia, arthritis with status post knee replacements, history of A-fib, on Eliquis, also takes amiodarone, and overweight state, who reports a 3 to 21-monthhistory of vivid dreams and nightmares as well as acting out in his dreams, symptoms are intermittent, no obvious triggers, no recent medication changes.  Wife reports that when he was hospitalized for his stroke they gave him some medication for his anxiety and that seemed to make him worse.  He is no longer on that medication.  He does take Wellbutrin high dose, has been on it for many years.  He wakes up fairly well rested but does get tired during the day, takes a nap usually for less than half an hour in the afternoon.  He has had hand pain and foot pain since chemotherapy.  He has a bedtime of 11:30 PM and rise time between 5 and 8 AM.  He denies night to night nocturia or recurrent nocturnal or morning headaches.  He snores occasionally, no witnessed apneas reported, no gasping sensations.  He quit smoking in the 70s, he drinks alcohol occasionally, he drinks caffeine in the form of soda, about 2 bottles per day.  His Epworth  sleepiness score is 8 out of 24, fatigue severity score is 31 out of 63.  His wife has a CPAP machine and he is familiar with sleep apnea treatment, he would consider a CPAP if necessary.   I reviewed your office note from 06/10/2022. His blood pressure was highly elevated at the time 167/110. He lives with his wife, they have no pets in the household, they do have a TV in the bedroom but it is off at night.   His Past Medical History Is Significant For: Past Medical History:  Diagnosis Date   Anxiety    Arthritis    OSTEO IN KNEE   Asthma    as child   Colon cancer ([facility]    Depression    Elevated prostate specific antigen (PSA)    Generalized anxiety disorder 04/09/2015   Hematospermia    Hx antineoplastic chemotherapy 2010   Hypertension    Idiopathic peripheral neuropathy    TOES OF BOTH FEET DUE TO CHEMO   Incomplete bladder emptying    Malignant ne[place] of prostate [place]    Nodular prostate with lower urinary tract symptoms    Panic attacks 04/09/2015   Primary localized osteoarthrosis of the knee, right 05/19/2016   Prostatitis    S/P total knee arthroplasty, left 05/19/2016   Spermatocele    Weak urinary stream     His Past Surgical History Is Significant For: Past Surgical History:  Procedure Laterality Date   COLON RESECTION   NOV 2009   COLONOSCOPY  PROSTATE BIOPSY     SPERMATOCELECTOMY Left 05/07/2014   Procedure: LEFT SPERMATOCELECTOMY;  Surgeon: Malka So, MD;  Location: [facility];  Service: Urology;  Laterality: Left;   TONSILLECTOMY     TOTAL KNEE ARTHROPLASTY Left 04/21/2015   Procedure: LEFT TOTAL KNEE ARTHROPLASTY;  Surgeon: Elsie Saas, MD;  Location: [place];  Service: Orthopedics;  Laterality: Left;   TOTAL KNEE ARTHROPLASTY Right 05/31/2016   Procedure: TOTAL KNEE ARTHROPLASTY;  Surgeon: Elsie Saas, MD;  Location: [place];  Service: Orthopedics;  Laterality: Right;   TRANSURETHRAL RESECTION OF PROSTATE N/A 05/07/2014   Procedure: TRANSURETHRAL RESECTION  OF THE PROSTATE WITH GYRUS INSTRUMENTS;  Surgeon: Malka So, MD;  Location: [facility];  Service: Urology;  Laterality: N/A;    His Family History Is Significant For: Family History  Problem Relation Age of Onset   Stroke Father        age 78   Cancer Brother        neoplasm of brain   Restless legs syndrome Neg Hx    Sleep apnea Neg Hx     His Social History Is Significant For: Social History   Socioeconomic History   Marital status: Married    Spouse name: Not on file   Number of children: 9   Years of education: Not on file   Highest education level: Not on file  Occupational History   Occupation: retire  Tobacco Use   Smoking status: Former    Packs/day: 0.25    Years: 3.00    Total pack years: 0.75    Types: Cigarettes    Quit date: 04/12/1972    Years since quitting: 50.2   Smokeless tobacco: Never   Tobacco comments:    smoked 1 cigarette per day SOME DAYS  Vaping Use   Vaping Use: Never used  Substance and Sexual Activity   Alcohol use: Yes    Alcohol/week: 1.0 standard drink of alcohol    Types: 1 Cans of beer per week    Comment: OCCASIONAL   Drug use: No   Sexual activity: Yes  Other Topics Concern   Not on file  Social History Narrative   Shawnie Dapper will take care of him after his surgery    Her cell is [phone]      Update 06/22/2022   Caffeine: maybe 2 sodas/day    Lives at home with wife   Right handed   Social Determinants of Health   Financial Resource Strain: Low Risk  (08/18/2021)   Overall Financial Resource Strain (CARDIA)    Difficulty of Paying Living Expenses: Not hard at all  Food Insecurity: No Food Insecurity (08/18/2021)   Hunger Vital Sign    Worried About Running Out of Food in the Last Year: Never true    Ran Out of Food in the Last Year: Never true  Transportation Needs: No Transportation Needs (08/18/2021)   PRAPARE - Hydrologist (Medical): No    Lack of Transportation (Non-Medical): No   Physical Activity: Insufficiently Active (08/18/2021)   Exercise Vital Sign    Days of Exercise per Week: 3 days    Minutes of Exercise per Session: 30 min  Stress: No Stress Concern Present (08/18/2021)   [place]    Feeling of Stress : Not at all  Social Connections: Moderately Isolated (08/18/2021)   Social Connection and Isolation Panel [NHANES]    Frequency of Communication with Friends  and Family: Once a week    Frequency of Social Gatherings with Friends and Family: Twice a week    Attends Religious Services: Never    Marine scientist or Organizations: No    Attends Music therapist: Never    Marital Status: Married    His Allergies Are:  Allergies  Allergen Reactions   Amlodipine Other (See Comments)    Dizziness    Gabapentin     hallucinations  :   His Current Medications Are:  Outpatient Encounter Medications as of 06/22/2022  Medication Sig   acetaminophen (TYLENOL) 500 MG tablet Take 500 mg by mouth every 6 (six) hours as needed for mild pain or headache.   amiodarone (PACERONE) 200 MG tablet Take 200 mg by mouth daily.   atorvastatin (LIPITOR) 20 MG tablet Take 20 mg by mouth daily.   buPROPion (WELLBUTRIN XL) 150 MG 24 hr tablet Take 450 mg by mouth every morning.    ELIQUIS 5 MG TABS tablet Take 1 tablet by mouth twice daily   hydrOXYzine (ATARAX) 10 MG tablet TAKE 1 TABLET BY MOUTH EVERY 6 HOURS AS NEEDED FOR ANXIETY OR  ITCHING   losartan (COZAAR) 100 MG tablet Take 100 mg by mouth daily.   metoprolol succinate (TOPROL-XL) 100 MG 24 hr tablet Take 100 mg by mouth every morning.   metoprolol succinate (TOPROL-XL) 50 MG 24 hr tablet Take 50 mg by mouth at bedtime.   Multiple Vitamin (MULTIVITAMIN WITH MINERALS) TABS tablet Take 1 tablet by mouth daily.   spironolactone (ALDACTONE) 25 MG tablet Take 1 tablet (25 mg total) by mouth daily.   vitamin B-12 1000 MCG tablet Take 1 tablet  (1,000 mcg total) by mouth daily.   [DISCONTINUED] metoprolol succinate (TOPROL-XL) 25 MG 24 hr tablet TAKE 5 TABLETS BY MOUTH ONCE DAILY   No facility-administered encounter medications on file as of 06/22/2022.  :   Review of Systems:  Out of a complete 14 point review of systems, all are reviewed and negative with the exception of these symptoms as listed below:  Review of Systems  Neurological:        Patient is here with his wife for sleep evaluation. He reports that his feet bother him at night. They ache and it makes it difficult to sleep, otherwise he feels he wouldn't have a problem sleeping. He reports that he had chemotherapy in 2009 for cancer and it left him with neuropathy. His hands are also involved slightly but the feet are worse. ESS 8 FSS 39    Objective:  Neurological Exam  Physical Exam Physical Examination:   Vitals:   06/22/22 1029 06/22/22 1055  BP: (!) 163/86 129/84  Pulse: (!) 47 (!) 53    General Examination: The patient is a very pleasant 77 y.o. male in no acute distress. He appears well-developed and well-nourished and well groomed.   HEENT: Normocephalic, atraumatic, pupils are equal, round and reactive to light, extraocular tracking is good without limitation to gaze excursion or nystagmus noted. Hearing is grossly intact. Face is symmetric with normal facial animation. Speech is clear with no dysarthria noted. There is no hypophonia. There is no lip, neck/head, jaw or voice tremor. Neck is supple with full range of passive and active motion. There are no carotid bruits on auscultation. Oropharynx exam reveals: moderate mouth dryness, full dentures, mild airway crowding secondary to elongated uvula.  Tonsils absent.  Mallampati class II.  Neck circumference  17 inches.  Tongue protrudes  centrally and palate elevates symmetrically.    Chest: Clear to auscultation without wheezing, rhonchi or crackles noted.  Heart: S1+S2+0, regular and normal without  murmurs, rubs or gallops noted.  Bradycardia noted.  Abdomen: Soft, non-tender and non-distended.  Extremities: There is no pitting edema in the distal lower extremities bilaterally.   Skin: Warm and dry without trophic changes noted.   Musculoskeletal: exam reveals no obvious joint deformities.   Neurologically:  Mental status: The patient is awake, alert and oriented in all 4 spheres. His immediate and remote memory, attention, language skills and fund of knowledge are appropriate. There is no evidence of aphasia, agnosia, apraxia or anomia. Speech is clear with normal prosody and enunciation. Thought process is linear. Mood is normal and affect is normal.  Cranial nerves II - XII are as described above under HEENT exam.  Motor exam: Normal bulk, strength and tone is noted, no significant weakness. There is no obvious action or resting tremor.  Fine motor skills and coordination: grossly intact.  Cerebellar testing: No dysmetria or intention tremor. There is no truncal or gait ataxia.  Sensory exam: intact to light touch in the upper and lower extremities.  Gait, station and balance: He stands easily. No veering to one side is noted. No leaning to one side is noted. Posture is age-appropriate and stance is narrow based. Gait shows normal stride length and normal pace. No problems turning are noted.   Assessment and Plan:  In summary, George [name]. is a very pleasant 77 y.o.-year old male with an underlying complex medical history of colon cancer, status post resection, hypertension, peripheral neuropathy, asthma, anxiety, depression, prostatitis, prostate cancer with status post prostatectomy, history of right PCA stroke in November 2022 with residual left hemiparesis and left hemianopia, arthritis with status post knee replacements, history of A-fib, on Eliquis, also takes amiodarone, and overweight state, who presents for evaluation of his sleep disturbance including dream enactment  behavior and vivid dreams.  He may have some risk for sleep apnea, and in light of his history of stroke and A-fib, a laboratory attended sleep study is indicated.  He is advised to consider sleep apnea treatment if he has a diagnosis of OSA based on his sleep study results.  They are advised that there is no specific treatment for parasomnias or vivid dreams in general.  Medication effect is certainly a possibility but he has been stable on his medications for at least 2 or 3 years as I understand.  We talked about the importance of healthy lifestyle, and maintaining good sleep hygiene.  We will proceed with a sleep study and consider follow-up in sleep clinic accordingly.  We will keep him posted as to his sleep study results by phone call.  I answered all their questions today and the patient and his wife are in agreement.   Thank you very much for allowing me to participate in the care of this nice patient. If I can be of any further assistance to you please do not hesitate to talk to me.  Sincerely,   Star Age, MD, PhD

## 2022-07-04 ENCOUNTER — Other Ambulatory Visit: Payer: Self-pay | Admitting: Physical Medicine and Rehabilitation

## 2022-07-05 DIAGNOSIS — F330 Major depressive disorder, recurrent, mild: Secondary | ICD-10-CM | POA: Diagnosis not present

## 2022-07-05 DIAGNOSIS — F411 Generalized anxiety disorder: Secondary | ICD-10-CM | POA: Diagnosis not present

## 2022-07-26 DIAGNOSIS — N1832 Chronic kidney disease, stage 3b: Secondary | ICD-10-CM | POA: Diagnosis not present

## 2022-07-30 ENCOUNTER — Other Ambulatory Visit: Payer: Self-pay | Admitting: Physician Assistant

## 2022-07-30 NOTE — Telephone Encounter (Signed)
Last OV 05/07/22 Next OV 11/12/22  Eliquis 5 mg tab  Last refill 05/05/22  Qty: 180/0  Refilled #180/1

## 2022-08-03 ENCOUNTER — Other Ambulatory Visit: Payer: Self-pay | Admitting: Physical Medicine and Rehabilitation

## 2022-08-04 ENCOUNTER — Telehealth: Payer: Self-pay | Admitting: Neurology

## 2022-08-04 DIAGNOSIS — D631 Anemia in chronic kidney disease: Secondary | ICD-10-CM | POA: Diagnosis not present

## 2022-08-04 DIAGNOSIS — N1832 Chronic kidney disease, stage 3b: Secondary | ICD-10-CM | POA: Diagnosis not present

## 2022-08-04 DIAGNOSIS — I129 Hypertensive chronic kidney disease with stage 1 through stage 4 chronic kidney disease, or unspecified chronic kidney disease: Secondary | ICD-10-CM | POA: Diagnosis not present

## 2022-08-04 DIAGNOSIS — N2581 Secondary hyperparathyroidism of renal origin: Secondary | ICD-10-CM | POA: Diagnosis not present

## 2022-08-04 NOTE — Telephone Encounter (Signed)
08/03/22 left VM [place]  07/08/22 Medicare/[employer] no auth req EE

## 2022-08-10 NOTE — Telephone Encounter (Signed)
4/30:[id number]-mla  

## 2022-08-20 DIAGNOSIS — E539 Vitamin B deficiency, unspecified: Secondary | ICD-10-CM | POA: Diagnosis not present

## 2022-08-20 DIAGNOSIS — E782 Mixed hyperlipidemia: Secondary | ICD-10-CM | POA: Diagnosis not present

## 2022-08-20 DIAGNOSIS — Z8679 Personal history of other diseases of the circulatory system: Secondary | ICD-10-CM | POA: Diagnosis not present

## 2022-08-20 DIAGNOSIS — I10 Essential (primary) hypertension: Secondary | ICD-10-CM | POA: Diagnosis not present

## 2022-09-09 ENCOUNTER — Encounter: Payer: Self-pay | Admitting: Internal Medicine

## 2022-09-13 ENCOUNTER — Other Ambulatory Visit: Payer: Self-pay | Admitting: Physical Medicine and Rehabilitation

## 2022-09-16 ENCOUNTER — Ambulatory Visit (INDEPENDENT_AMBULATORY_CARE_PROVIDER_SITE_OTHER): Payer: Medicare Other

## 2022-09-16 VITALS — Wt 208.0 lb

## 2022-09-16 DIAGNOSIS — Z0000 Encounter for general adult medical examination without abnormal findings: Secondary | ICD-10-CM | POA: Diagnosis not present

## 2022-09-16 NOTE — Patient Instructions (Signed)
George [name] , Thank you for taking time to come for your Medicare Wellness Visit. I appreciate your ongoing commitment to your health goals. Please review the following plan we discussed and let me know if I can assist you in the future.   These are the goals we discussed:  Goals      Patient Stated     Get back to better healthy     Patient Stated     Get back to walking         This is a list of the screening recommended for you and due dates:  Health Maintenance  Topic Date Due   DTaP/Tdap/Td vaccine (1 - Tdap) Never done   COVID-19 Vaccine (4 - 2023-24 season) 12/11/2021   Flu Shot  11/11/2022   Medicare Annual Wellness Visit  09/16/2023   Pneumonia Vaccine  Completed   Zoster (Shingles) Vaccine  Completed   HPV Vaccine  Aged Out   Hepatitis C Screening  Discontinued    Advanced directives: Please bring a copy of your health care power of attorney and living will to the office at your convenience.  Conditions/risks identified: get back to walking   Next appointment: Follow up in one year for your annual wellness visit.   Preventive Care 65 Years and Older, Male  Preventive care refers to lifestyle choices and visits with your health care provider that can promote health and wellness. What does preventive care include? A yearly physical exam. This is also called an annual well check. Dental exams once or twice a year. Routine eye exams. Ask your health care provider how often you should have your eyes checked. Personal lifestyle choices, including: Daily care of your teeth and gums. Regular physical activity. Eating a healthy diet. Avoiding tobacco and drug use. Limiting alcohol use. Practicing safe sex. Taking low doses of aspirin every day. Taking vitamin and mineral supplements as recommended by your health care provider. What happens during an annual well check? The services and screenings done by your health care provider during your annual well check will  depend on your age, overall health, lifestyle risk factors, and family history of disease. Counseling  Your health care provider may ask you questions about your: Alcohol use. Tobacco use. Drug use. Emotional well-being. Home and relationship well-being. Sexual activity. Eating habits. History of falls. Memory and ability to understand (cognition). Work and work Astronomer. Screening  You may have the following tests or measurements: Height, weight, and BMI. Blood pressure. Lipid and cholesterol levels. These may be checked every 5 years, or more frequently if you are over 75 years old. Skin check. Lung cancer screening. You may have this screening every year starting at age 58 if you have a 30-pack-year history of smoking and currently smoke or have quit within the past 15 years. Fecal occult blood test (FOBT) of the stool. You may have this test every year starting at age 80. Flexible sigmoidoscopy or colonoscopy. You may have a sigmoidoscopy every 5 years or a colonoscopy every 10 years starting at age 84. Prostate cancer screening. Recommendations will vary depending on your family history and other risks. Hepatitis C blood test. Hepatitis B blood test. Sexually transmitted disease (STD) testing. Diabetes screening. This is done by checking your blood sugar (glucose) after you have not eaten for a while (fasting). You may have this done every 1-3 years. Abdominal aortic aneurysm (AAA) screening. You may need this if you are a current or former smoker. Osteoporosis. You may  be screened starting at age 55 if you are at high risk. Talk with your health care provider about your test results, treatment options, and if necessary, the need for more tests. Vaccines  Your health care provider may recommend certain vaccines, such as: Influenza vaccine. This is recommended every year. Tetanus, diphtheria, and acellular pertussis (Tdap, Td) vaccine. You may need a Td booster every 10  years. Zoster vaccine. You may need this after age 7. Pneumococcal 13-valent conjugate (PCV13) vaccine. One dose is recommended after age 26. Pneumococcal polysaccharide (PPSV23) vaccine. One dose is recommended after age 12. Talk to your health care provider about which screenings and vaccines you need and how often you need them. This information is not intended to replace advice given to you by your health care provider. Make sure you discuss any questions you have with your health care provider. Document Released: 04/25/2015 Document Revised: 12/17/2015 Document Reviewed: 01/28/2015 Elsevier Interactive Patient Education  2017 [employer].  Fall Prevention in the Home Falls can cause injuries. They can happen to people of all ages. There are many things you can do to make your home safe and to help prevent falls. What can I do on the outside of my home? Regularly fix the edges of walkways and driveways and fix any cracks. Remove anything that might make you trip as you walk through a door, such as a raised step or threshold. Trim any bushes or trees on the path to your home. Use bright outdoor lighting. Clear any walking paths of anything that might make someone trip, such as rocks or tools. Regularly check to see if handrails are loose or broken. Make sure that both sides of any steps have handrails. Any raised decks and porches should have guardrails on the edges. Have any leaves, snow, or ice cleared regularly. Use sand or salt on walking paths during winter. Clean up any spills in your garage right away. This includes oil or grease spills. What can I do in the bathroom? Use night lights. Install grab bars by the toilet and in the tub and shower. Do not use towel bars as grab bars. Use non-skid mats or decals in the tub or shower. If you need to sit down in the shower, use a plastic, non-slip stool. Keep the floor dry. Clean up any water that spills on the floor as soon as it  happens. Remove soap buildup in the tub or shower regularly. Attach bath mats securely with double-sided non-slip rug tape. Do not have throw rugs and other things on the floor that can make you trip. What can I do in the bedroom? Use night lights. Make sure that you have a light by your bed that is easy to reach. Do not use any sheets or blankets that are too big for your bed. They should not hang down onto the floor. Have a firm chair that has side arms. You can use this for support while you get dressed. Do not have throw rugs and other things on the floor that can make you trip. What can I do in the kitchen? Clean up any spills right away. Avoid walking on wet floors. Keep items that you use a lot in easy-to-reach places. If you need to reach something above you, use a strong step stool that has a grab bar. Keep electrical cords out of the way. Do not use floor polish or wax that makes floors slippery. If you must use wax, use non-skid floor wax. Do not  have throw rugs and other things on the floor that can make you trip. What can I do with my stairs? Do not leave any items on the stairs. Make sure that there are handrails on both sides of the stairs and use them. Fix handrails that are broken or loose. Make sure that handrails are as long as the stairways. Check any carpeting to make sure that it is firmly attached to the stairs. Fix any carpet that is loose or worn. Avoid having throw rugs at the top or bottom of the stairs. If you do have throw rugs, attach them to the floor with carpet tape. Make sure that you have a light switch at the top of the stairs and the bottom of the stairs. If you do not have them, ask someone to add them for you. What else can I do to help prevent falls? Wear shoes that: Do not have high heels. Have rubber bottoms. Are comfortable and fit you well. Are closed at the toe. Do not wear sandals. If you use a stepladder: Make sure that it is fully opened.  Do not climb a closed stepladder. Make sure that both sides of the stepladder are locked into place. Ask someone to hold it for you, if possible. Clearly mark and make sure that you can see: Any grab bars or handrails. First and last steps. Where the edge of each step is. Use tools that help you move around (mobility aids) if they are needed. These include: Canes. Walkers. Scooters. Crutches. Turn on the lights when you go into a dark area. Replace any light bulbs as soon as they burn out. Set up your furniture so you have a clear path. Avoid moving your furniture around. If any of your floors are uneven, fix them. If there are any pets around you, be aware of where they are. Review your medicines with your doctor. Some medicines can make you feel dizzy. This can increase your chance of falling. Ask your doctor what other things that you can do to help prevent falls. This information is not intended to replace advice given to you by your health care provider. Make sure you discuss any questions you have with your health care provider. Document Released: 01/23/2009 Document Revised: 09/04/2015 Document Reviewed: 05/03/2014 Elsevier Interactive Patient Education  2017 [employer].

## 2022-09-16 NOTE — Progress Notes (Signed)
I connected with  George [name]. on 09/16/22 by a audio enabled telemedicine application and verified that I am speaking with the correct person using two identifiers.  Patient Location: Home  Provider Location: Office/Clinic  I discussed the limitations of evaluation and management by telemedicine. The patient expressed understanding and agreed to proceed.   Subjective:   George [name]. is a 77 y.o. male who presents for Medicare Annual/Subsequent preventive examination.  Review of Systems     Cardiac Risk Factors include: advanced age (>30men, >67 women);dyslipidemia;hypertension;male gender     Objective:    Today's Vitals   09/16/22 0900  Weight: 208 lb (94.3 kg)   Body mass index is 28.21 kg/m.     09/16/2022    9:06 AM 08/18/2021   10:27 AM 07/13/2021    9:02 PM 05/13/2021    1:17 PM 04/02/2021    9:13 AM 03/05/2021    3:21 PM 02/27/2021    7:49 PM  Advanced Directives  Does Patient Have a Medical Advance Directive? Yes Yes Yes Yes No Yes No  Type of Estate agent of [place];Living will Healthcare Power of [employer] Power of [employer] Power of Attorney   Does patient want to make changes to medical advance directive?   No - Patient declined  No - Patient declined No - Patient declined   Copy of Healthcare Power of Attorney in Chart? No - copy requested No - copy requested    No - copy requested   Would patient like information on creating a medical advance directive?     No - Patient declined No - Patient declined No - Patient declined    Current Medications (verified) Outpatient Encounter Medications as of 09/16/2022  Medication Sig   acetaminophen (TYLENOL) 500 MG tablet Take 500 mg by mouth every 6 (six) hours as needed for mild pain or headache.   amiodarone (PACERONE) 200 MG tablet Take 200 mg by mouth daily.   apixaban (ELIQUIS) 5 MG TABS tablet Take 1 tablet by mouth twice daily   atorvastatin (LIPITOR) 20 MG tablet  Take 20 mg by mouth daily.   buPROPion (WELLBUTRIN XL) 150 MG 24 hr tablet Take 450 mg by mouth every morning.    hydrOXYzine (ATARAX) 10 MG tablet TAKE 1 TABLET BY MOUTH EVERY 6 HOURS AS NEEDED FOR ANXIETY OR  ITCHING   losartan (COZAAR) 100 MG tablet Take 100 mg by mouth daily.   metoprolol succinate (TOPROL-XL) 100 MG 24 hr tablet Take 100 mg by mouth every morning.   metoprolol succinate (TOPROL-XL) 50 MG 24 hr tablet Take 50 mg by mouth at bedtime.   Multiple Vitamin (MULTIVITAMIN WITH MINERALS) TABS tablet Take 1 tablet by mouth daily.   spironolactone (ALDACTONE) 25 MG tablet Take 1 tablet by mouth once daily   vitamin B-12 1000 MCG tablet Take 1 tablet (1,000 mcg total) by mouth daily.   No facility-administered encounter medications on file as of 09/16/2022.    Allergies (verified) Amlodipine and Gabapentin   History: Past Medical History:  Diagnosis Date   Anxiety    Arthritis    OSTEO IN KNEE   Asthma    as child   Colon cancer (HCC)    Depression    Elevated prostate specific antigen (PSA)    Generalized anxiety disorder 04/09/2015   Hematospermia    Hx antineoplastic chemotherapy 2010   Hypertension    Idiopathic peripheral neuropathy    TOES OF BOTH FEET DUE  TO CHEMO   Incomplete bladder emptying    Malignant neoplasm of prostate (HCC)    Nodular prostate with lower urinary tract symptoms    Panic attacks 04/09/2015   Primary localized osteoarthrosis of the knee, right 05/19/2016   Prostatitis    S/P total knee arthroplasty, left 05/19/2016   Spermatocele    Weak urinary stream    Past Surgical History:  Procedure Laterality Date   COLON RESECTION   NOV 2009   COLONOSCOPY     PROSTATE BIOPSY     SPERMATOCELECTOMY Left 05/07/2014   Procedure: LEFT SPERMATOCELECTOMY;  Surgeon: Anner Crete, MD;  Location: [facility];  Service: Urology;  Laterality: Left;   TONSILLECTOMY     TOTAL KNEE ARTHROPLASTY Left 04/21/2015   Procedure: LEFT TOTAL KNEE ARTHROPLASTY;  Surgeon:  Salvatore Marvel, MD;  [facility];  Service: Orthopedics;  Laterality: Left;   TOTAL KNEE ARTHROPLASTY Right 05/31/2016   Procedure: TOTAL KNEE ARTHROPLASTY;  Surgeon: Salvatore Marvel, MD;  Location: [facility] OR;  Service: Orthopedics;  Laterality: Right;   TRANSURETHRAL RESECTION OF PROSTATE N/A 05/07/2014   Procedure: TRANSURETHRAL RESECTION OF THE PROSTATE WITH GYRUS INSTRUMENTS;  Surgeon: Anner Crete, MD;  Location: [facility];  Service: Urology;  Laterality: N/A;   Family History  Problem Relation Age of Onset   Stroke Father        age 34   Cancer Brother        neoplasm of brain   Restless legs syndrome Neg Hx    Sleep apnea Neg Hx    Social History   Socioeconomic History   Marital status: Married    Spouse name: Not on file   Number of children: 9   Years of education: Not on file   Highest education level: Not on file  Occupational History   Occupation: retire  Tobacco Use   Smoking status: Former    Packs/day: 0.25    Years: 3.00    Additional pack years: 0.00    Total pack years: 0.75    Types: Cigarettes    Quit date: 04/12/1972    Years since quitting: 50.4   Smokeless tobacco: Never   Tobacco comments:    smoked 1 cigarette per day SOME DAYS  Vaping Use   Vaping Use: Never used  Substance and Sexual Activity   Alcohol use: Yes    Alcohol/week: 1.0 standard drink of alcohol    Types: 1 Cans of beer per week    Comment: OCCASIONAL   Drug use: No   Sexual activity: Yes  Other Topics Concern   Not on file  Social History Narrative   Mila Homer will take care of him after his surgery    Her cell is [phone]      Update 06/22/2022   Caffeine: maybe 2 sodas/day    Lives at home with wife   Right handed   Social Determinants of Health   Financial Resource Strain: Low Risk  (09/16/2022)   Overall Financial Resource Strain (CARDIA)    Difficulty of Paying Living Expenses: Not hard at all  Food Insecurity: No Food Insecurity (09/16/2022)   Hunger Vital Sign     Worried About Running Out of Food in the Last Year: Never true    Ran Out of Food in the Last Year: Never true  Transportation Needs: No Transportation Needs (09/16/2022)   PRAPARE - Administrator, Civil Service (Medical): No    Lack of Transportation (Non-Medical): No  Physical Activity: Insufficiently Active (09/16/2022)   Exercise Vital Sign    Days of Exercise per Week: 3 days    Minutes of Exercise per Session: 10 min  Stress: Stress Concern Present (09/16/2022)   [employer] of Occupational Health - Occupational Stress Questionnaire    Feeling of Stress : To some extent  Social Connections: Moderately Isolated (09/16/2022)   Social Connection and Isolation Panel [NHANES]    Frequency of Communication with Friends and Family: Once a week    Frequency of Social Gatherings with Friends and Family: More than three times a week    Attends Religious Services: Never    Database administrator or Organizations: No    Attends Engineer, structural: Never    Marital Status: Married    Tobacco Counseling Counseling given: Not Answered Tobacco comments: smoked 1 cigarette per day SOME DAYS   Clinical Intake:  Pre-visit preparation completed: Yes  Pain : No/denies pain     BMI - recorded: 28.21 Nutritional Status: BMI 25 -29 Overweight Nutritional Risks: None Diabetes: No  How often do you need to have someone help you when you read instructions, pamphlets, or other written materials from your doctor or pharmacy?: 1 - Never  Diabetic?no  Interpreter Needed?: No  Information entered by :: Lanier Ensign, LPN   Activities of Daily Living    09/16/2022    9:07 AM  In your present state of health, do you have any difficulty performing the following activities:  Hearing? 1  Comment HOH  Vision? 0  Difficulty concentrating or making decisions? 0  Walking or climbing stairs? 0  Dressing or bathing? 0  Doing errands, shopping? 0  Preparing Food and eating  ? N  Using the Toilet? N  In the past six months, have you accidently leaked urine? N  Do you have problems with loss of bowel control? N  Managing your Medications? N  Managing your Finances? N  Housekeeping or managing your Housekeeping? N    Patient Care Team: Allwardt, Crist Infante, PA-C as PCP - General (Physician Assistant)  Indicate any recent Medical Services you may have received from other than [facility] providers in the past year (date may be approximate).     Assessment:   This is a routine wellness examination for [place].  Hearing/Vision screen Hearing Screening - Comments:: Pt stated HOH  Vision Screening - Comments:: Pt [facility] for annual [facility] exams   Dietary issues and exercise activities discussed: Current Exercise Habits: Home exercise routine, Type of exercise: strength training/weights, Time (Minutes): 10, Frequency (Times/Week): 3, Weekly Exercise (Minutes/Week): 30   Goals Addressed             This Visit's Progress    Patient Stated       Get back to walking        Depression Screen    09/16/2022    9:04 AM 05/07/2022   10:41 AM 10/22/2021   10:49 AM 10/19/2021   12:56 PM 08/18/2021   10:25 AM 07/20/2021   11:38 AM 04/10/2021    3:03 PM  PHQ 2/9 Scores  PHQ - 2 Score 0 2 2 2  0 3 2  PHQ- 9 Score  7  5  8 6     Fall Risk    09/16/2022    9:07 AM 05/07/2022   10:41 AM 10/22/2021   10:49 AM 08/18/2021   10:28 AM 07/20/2021   11:03 AM  Fall Risk   Falls  in the past year? 0 0 0 0 1  Number falls in past yr: 0 0 0 0 0  Injury with Fall? 0 0  0 0  Risk for fall due to : Impaired vision No Fall Risks  Impaired vision;Impaired balance/gait Impaired balance/gait  Follow up Falls prevention discussed Falls evaluation completed  Falls prevention discussed Falls evaluation completed    FALL RISK PREVENTION PERTAINING TO THE HOME:  Any stairs in or around the home? No  If so, are there any without handrails? No  Home free of loose throw  rugs in walkways, pet beds, electrical cords, etc? Yes  Adequate lighting in your home to reduce risk of falls? Yes   ASSISTIVE DEVICES UTILIZED TO PREVENT FALLS:  Life alert? No  Use of a cane, walker or w/c? No  Grab bars in the bathroom? Yes  Shower chair or bench in shower? Yes  Elevated toilet seat or a handicapped toilet? Yes   TIMED UP AND GO:  Was the test performed? No .    Cognitive Function:        09/16/2022    9:07 AM 08/18/2021   10:40 AM  6CIT Screen  What Year? 0 points 0 points  What month? 0 points 0 points  What time? 0 points 0 points  Count back from 20 0 points 0 points  Months in reverse 0 points 0 points  Repeat phrase 0 points 0 points  Total Score 0 points 0 points    Immunizations Immunization History  Administered Date(s) Administered   Influenza-Unspecified 02/25/2021   PFIZER(Purple Top)SARS-COV-2 Vaccination 05/07/2019, 05/28/2019   PNEUMOCOCCAL CONJUGATE-20 05/07/2022   Pfizer Covid-19 Vaccine Bivalent Booster 17yrs & up 02/16/2020   Zoster Recombinat (Shingrix) 04/22/2021, 06/22/2021    TDAP status: Due, Education has been provided regarding the importance of this vaccine. Advised may receive this vaccine at local pharmacy or Health Dept. Aware to provide a copy of the vaccination record if obtained from local pharmacy or Health Dept. Verbalized acceptance and understanding.  Flu Vaccine status: Declined, Education has been provided regarding the importance of this vaccine but patient still declined. Advised may receive this vaccine at local pharmacy or Health Dept. Aware to provide a copy of the vaccination record if obtained from local pharmacy or Health Dept. Verbalized acceptance and understanding.  Pneumococcal vaccine status: Up to date  Covid-19 vaccine status: Completed vaccines  Qualifies for Shingles Vaccine? Yes   Zostavax completed Yes   Shingrix Completed?: Yes  Screening Tests Health Maintenance  Topic Date Due    DTaP/Tdap/Td (1 - Tdap) Never done   COVID-19 Vaccine (4 - 2023-24 season) 12/11/2021   INFLUENZA VACCINE  11/11/2022   Medicare Annual Wellness (AWV)  09/16/2023   Pneumonia Vaccine 64+ Years old  Completed   Zoster Vaccines- Shingrix  Completed   HPV VACCINES  Aged Out   Hepatitis C Screening  Discontinued    Health Maintenance  Health Maintenance Due  Topic Date Due   DTaP/Tdap/Td (1 - Tdap) Never done   COVID-19 Vaccine (4 - 2023-24 season) 12/11/2021    Pt has an appt to discuss colonoscopy    Additional Screening:  Hepatitis C Screening: does not qualify  Vision Screening: Recommended annual ophthalmology exams for early detection of glaucoma and other disorders of the eye. Is the patient up to date with their annual eye exam?  Yes  Who is the provider or what is the name of the office in which the patient attends annual  eye exams? [facility]  If pt is not established with a provider, would they like to be referred to a provider to establish care? No .   Dental Screening: Recommended annual dental exams for proper oral hygiene  Community Resource Referral / Chronic Care Management: CRR required this visit?  No   CCM required this visit?  No      Plan:     I have personally reviewed and noted the following in the patient's chart:   Medical and social history Use of alcohol, tobacco or illicit drugs  Current medications and supplements including opioid prescriptions. Patient is not currently taking opioid prescriptions. Functional ability and status Nutritional status Physical activity Advanced directives List of other physicians Hospitalizations, surgeries, and ER visits in previous 12 months Vitals Screenings to include cognitive, depression, and falls Referrals and appointments  In addition, I have reviewed and discussed with patient certain preventive protocols, quality metrics, and best practice recommendations. A written personalized care plan  for preventive services as well as general preventive health recommendations were provided to patient.     Marzella Schlein, LPN   04/17/1094   Nurse Notes: none

## 2022-09-20 ENCOUNTER — Ambulatory Visit: Payer: Medicare Other | Admitting: Nurse Practitioner

## 2022-10-04 DIAGNOSIS — F411 Generalized anxiety disorder: Secondary | ICD-10-CM | POA: Diagnosis not present

## 2022-10-04 DIAGNOSIS — F330 Major depressive disorder, recurrent, mild: Secondary | ICD-10-CM | POA: Diagnosis not present

## 2022-10-12 ENCOUNTER — Encounter: Payer: Medicare Other | Admitting: Internal Medicine

## 2022-10-13 ENCOUNTER — Other Ambulatory Visit: Payer: Self-pay | Admitting: Physical Medicine and Rehabilitation

## 2022-11-09 ENCOUNTER — Other Ambulatory Visit: Payer: Self-pay | Admitting: Physical Medicine and Rehabilitation

## 2022-11-12 ENCOUNTER — Ambulatory Visit: Payer: Medicare Other | Admitting: Physician Assistant

## 2022-11-12 ENCOUNTER — Encounter: Payer: Self-pay | Admitting: Physician Assistant

## 2022-11-12 VITALS — BP 125/72 | HR 46 | Temp 97.8°F | Ht 72.0 in | Wt 206.8 lb

## 2022-11-12 DIAGNOSIS — I10 Essential (primary) hypertension: Secondary | ICD-10-CM | POA: Diagnosis not present

## 2022-11-12 DIAGNOSIS — F411 Generalized anxiety disorder: Secondary | ICD-10-CM

## 2022-11-12 DIAGNOSIS — R5383 Other fatigue: Secondary | ICD-10-CM | POA: Diagnosis not present

## 2022-11-12 DIAGNOSIS — E538 Deficiency of other specified B group vitamins: Secondary | ICD-10-CM | POA: Diagnosis not present

## 2022-11-12 DIAGNOSIS — Z131 Encounter for screening for diabetes mellitus: Secondary | ICD-10-CM | POA: Diagnosis not present

## 2022-11-12 LAB — COMPREHENSIVE METABOLIC PANEL
ALT: 21 U/L (ref 0–53)
AST: 25 U/L (ref 0–37)
Albumin: 4.2 g/dL (ref 3.5–5.2)
Alkaline Phosphatase: 74 U/L (ref 39–117)
BUN: 23 mg/dL (ref 6–23)
CO2: 24 mEq/L (ref 19–32)
Calcium: 9.7 mg/dL (ref 8.4–10.5)
Chloride: 107 mEq/L (ref 96–112)
Creatinine, Ser: 2.43 mg/dL — ABNORMAL HIGH (ref 0.40–1.50)
GFR: 24.75 mL/min — ABNORMAL LOW (ref >60.00)
Glucose, Bld: 121 mg/dL — ABNORMAL HIGH (ref 70–99)
Potassium: 4.3 mEq/L (ref 3.5–5.1)
Sodium: 140 mEq/L (ref 135–145)
Total Bilirubin: 0.8 mg/dL (ref 0.2–1.2)
Total Protein: 7.7 g/dL (ref 6.0–8.3)

## 2022-11-12 LAB — CBC WITH DIFFERENTIAL/PLATELET
Basophils Absolute: 0 10*3/uL (ref 0.0–0.1)
Basophils Relative: 0.6 % (ref 0.0–3.0)
Eosinophils Absolute: 0.3 10*3/uL (ref 0.0–0.7)
Eosinophils Relative: 3.4 % (ref 0.0–5.0)
HCT: 37.3 % — ABNORMAL LOW (ref 39.0–52.0)
Hemoglobin: 11.9 g/dL — ABNORMAL LOW (ref 13.0–17.0)
Lymphocytes Relative: 19.3 % (ref 12.0–46.0)
Lymphs Abs: 1.7 10*3/uL (ref 0.7–4.0)
MCHC: 31.9 g/dL (ref 30.0–36.0)
MCV: 90.7 fl (ref 78.0–100.0)
Monocytes Absolute: 0.6 10*3/uL (ref 0.1–1.0)
Monocytes Relative: 7.2 % (ref 3.0–12.0)
Neutro Abs: 6 10*3/uL (ref 1.4–7.7)
Neutrophils Relative %: 69.5 % (ref 43.0–77.0)
Platelets: 327 10*3/uL (ref 150.0–400.0)
RBC: 4.11 Mil/uL — ABNORMAL LOW (ref 4.22–5.81)
RDW: 14.7 % (ref 11.5–15.5)
WBC: 8.6 10*3/uL (ref 4.0–10.5)

## 2022-11-12 LAB — VITAMIN B12: Vitamin B-12: 925 pg/mL — ABNORMAL HIGH (ref 211–911)

## 2022-11-12 LAB — TSH: TSH: 1.55 u[IU]/mL (ref 0.35–5.50)

## 2022-11-12 LAB — HEMOGLOBIN A1C: Hgb A1c MFr Bld: 5.6 % (ref 4.6–6.5)

## 2022-11-12 MED ORDER — SPIRONOLACTONE 25 MG PO TABS
25.0000 mg | ORAL_TABLET | Freq: Every day | ORAL | 3 refills | Status: DC
Start: 2022-11-12 — End: 2023-10-25

## 2022-11-12 NOTE — Patient Instructions (Signed)
Labs today - will call with results  Call your cardiologist to schedule f/up about your fatigue especially with exertion  ER if any sudden severe symptoms

## 2022-11-12 NOTE — Progress Notes (Unsigned)
Subjective:    Patient ID: George Curia., male    DOB: 02-Sep-1945, 77 y.o.   MRN: [id number]  Chief Complaint  Patient presents with  . Hypertension    HPI Patient is in today for 6 month recheck. Here with his wife.   Energy feels much lower the last 3 months per patient. Some short-windedness with activity. No chest pain. No dizziness or syncope. No vision changes or headaches.  Still having some back pain, but no other pain. No medication changes in interim.   Neuro had suggested sleep study, but he declined this procedure. States he rarely sleeps at home as it is and would be a waste of time to try lab sleep study.  In bed by 11:30 pm, usually sleeps about 5 hours.    Past Medical History:  Diagnosis Date  . Anxiety   . Arthritis    OSTEO IN KNEE  . Asthma    as child  . Colon cancer (HCC)   . Depression   . Elevated prostate specific antigen (PSA)   . Generalized anxiety disorder 04/09/2015  . Hematospermia   . Hx antineoplastic chemotherapy 2010  . Hypertension   . 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 Incomplete bladder emptying   . Malignant neoplasm of prostate (HCC)   . Nodular prostate with lower urinary tract symptoms   . Panic attacks 04/09/2015  . Primary localized osteoarthrosis of the knee, right 05/19/2016  . Prostatitis   . S/P total knee arthroplasty, left 05/19/2016  . Spermatocele   . Weak urinary stream     Past Surgical History:  Procedure Laterality Date  . COLON RESECTION   NOV 2009  . COLONOSCOPY    . PROSTATE BIOPSY    . SPERMATOCELECTOMY Left 05/07/2014   Procedure: LEFT SPERMATOCELECTOMY;  Surgeon: Anner Crete, MD;  Location: [facility];  Service: Urology;  Laterality: Left;  . TONSILLECTOMY    . TOTAL KNEE ARTHROPLASTY Left 04/21/2015   Procedure: LEFT TOTAL KNEE ARTHROPLASTY;  Surgeon: Salvatore Marvel, MD;  Location[facility] OR;  Service: Orthopedics;  Laterality: Left;  . TOTAL KNEE ARTHROPLASTY Right  05/31/2016   Procedure: TOTAL KNEE ARTHROPLASTY;  Surgeon: Salvatore Marvel, MD;  Location: [facility] OR;  Service: Orthopedics;  Laterality: Right;  . TRANSURETHRAL RESECTION OF PROSTATE N/A 05/07/2014   Procedure: TRANSURETHRAL RESECTION OF THE PROSTATE WITH GYRUS INSTRUMENTS;  Surgeon: Anner Crete, MD;  Location: [facility];  Service: Urology;  Laterality: N/A;    Family History  Problem Relation Age of Onset  . Stroke Father        age 2  . Cancer Brother        neoplasm of brain  . Restless legs syndrome Neg Hx   . Sleep apnea Neg Hx     Social History   Tobacco Use  . Smoking status: Former    Current packs/day: 0.00    Average packs/day: 0.3 packs/day for 3.0 years (0.8 ttl pk-yrs)    Types: Cigarettes    Start date: 04/12/1969    Quit date: 04/12/1972    Years since quitting: 50.6  . Smokeless tobacco: Never  . Tobacco comments:    smoked 1 cigarette per day SOME DAYS  Vaping Use  . Vaping status: Never Used  Substance Use Topics  . Alcohol use: Yes    Alcohol/week: 1.0 standard drink of alcohol    Types: 1 Cans of beer per week  Comment: OCCASIONAL  . Drug use: No     Allergies  Allergen Reactions  . Amlodipine Other (See Comments)    Dizziness   . Gabapentin     hallucinations    Review of Systems NEGATIVE UNLESS OTHERWISE INDICATED IN HPI      Objective:     BP 125/72   Pulse (!) 46   Temp 97.8 F (36.6 C) (Temporal)   Ht 6' (1.829 m)   Wt 206 lb 12.8 oz (93.8 kg)   SpO2 100%   BMI 28.05 kg/m   Wt Readings from Last 3 Encounters:  11/12/22 206 lb 12.8 oz (93.8 kg)  09/16/22 208 lb (94.3 kg)  06/22/22 208 lb (94.3 kg)    BP Readings from Last 3 Encounters:  11/12/22 125/72  06/22/22 129/84  06/10/22 (!) 167/110     Physical Exam     Assessment & Plan:  There are no diagnoses linked to this encounter.      No follow-ups on file.  This note was prepared with assistance of Conservation officer, historic buildings. Occasional wrong-word or  sound-a-like substitutions may have occurred due to the inherent limitations of voice recognition software.  Time Spent: *** minutes of total time was spent on the date of the encounter performing the following actions: chart review prior to seeing the patient, obtaining history, performing a medically necessary exam, counseling on the treatment plan, placing orders, and documenting in our EHR.        M , PA-C

## 2022-11-14 NOTE — Assessment & Plan Note (Signed)
Recheck labs Still think a sleep study could be helpful, but pt declines. He will check in with his cardiologist about this as well.

## 2022-11-14 NOTE — Assessment & Plan Note (Signed)
Normotensive, stable, will cont on spironolactone 25 mg, Toprol XL 25 mg, Cozaar 100 mg

## 2022-11-14 NOTE — Assessment & Plan Note (Signed)
Stable, doing well, follows with psych. Takes Wellbutrin XL 450 mg daily. Atarax 10 mg as needed.

## 2022-11-14 NOTE — Assessment & Plan Note (Signed)
Repeat labs, replenish prn

## 2022-11-19 DIAGNOSIS — I639 Cerebral infarction, unspecified: Secondary | ICD-10-CM | POA: Diagnosis not present

## 2022-11-19 DIAGNOSIS — R001 Bradycardia, unspecified: Secondary | ICD-10-CM | POA: Diagnosis not present

## 2022-11-19 DIAGNOSIS — E782 Mixed hyperlipidemia: Secondary | ICD-10-CM | POA: Diagnosis not present

## 2022-11-19 DIAGNOSIS — I10 Essential (primary) hypertension: Secondary | ICD-10-CM | POA: Diagnosis not present

## 2022-11-23 NOTE — Progress Notes (Unsigned)
Aleen Sells D.[facility] [address] [place] [address] Phone: [phone]   Assessment and Plan:     There are no diagnoses linked to this encounter.  ***   Pertinent previous records reviewed include ***   Follow Up: ***     Subjective:   I,  , am serving as a Neurosurgeon for Doctor Richardean Sale  Chief Complaint: low back pain   HPI:   12/17/2021 George [name]. is a 77 y.o. male who presents to [facility] at [facility] today for f/u L-sided low back pain with pain radiating to the L thigh. Pt was last seen by Dr. Denyse Amass on 11/12/21 and was prescribed prednisone, flexeril, hydrocodone, and increased his Lyrica dose to 75mg . Pt was also referred to PT, completing 3 visits. Today, pt reports improvement in his LBP. Pt notes pain had completed resolved for 1-2, then did some rigorous exercise at PT, and then LBP returned. Pt locates pain to the midline of his low back.    Dx imaging: 10/30/21 L-spine XR   Pertinent review of systems: No fevers or chills   Relevant historical information: Hypertension.  History of a stroke. History of metallic foreign body left orbit   Exam:  BP (!) 150/90   Pulse 61   Ht 6' (1.829 m)   Wt 206 lb 12.8 oz (93.8 kg)   SpO2 99%   BMI 28.05 kg/m  General: Well Developed, well nourished, and in no acute distress.    MSK: L-spine: Nontender midline.  Decreased lumbar motion.   11/24/2022 Patient is a 77 year old male complaining of low back pain. Patient states  Relevant Historical Information: ***  Additional pertinent review of systems negative.   Current Outpatient Medications:    acetaminophen (TYLENOL) 500 MG tablet, Take 500 mg by mouth every 6 (six) hours as needed for mild pain or headache., Disp: , Rfl:    amiodarone (PACERONE) 200 MG tablet, Take 200 mg by mouth daily., Disp: , Rfl:    apixaban (ELIQUIS) 5 MG TABS tablet, Take 1 tablet by mouth twice  daily, Disp: 180 tablet, Rfl: 1   atorvastatin (LIPITOR) 20 MG tablet, Take 20 mg by mouth daily., Disp: , Rfl:    buPROPion (WELLBUTRIN XL) 150 MG 24 hr tablet, Take 450 mg by mouth every morning. , Disp: , Rfl:    hydrOXYzine (ATARAX) 10 MG tablet, TAKE 1 TABLET BY MOUTH EVERY 6 HOURS AS NEEDED FOR ANXIETY OR  ITCHING, Disp: 120 tablet, Rfl: 0   losartan (COZAAR) 100 MG tablet, Take 100 mg by mouth daily., Disp: , Rfl:    metoprolol succinate (TOPROL-XL) 100 MG 24 hr tablet, Take 100 mg by mouth every morning., Disp: , Rfl:    metoprolol succinate (TOPROL-XL) 50 MG 24 hr tablet, Take 50 mg by mouth at bedtime., Disp: , Rfl:    Multiple Vitamin (MULTIVITAMIN WITH MINERALS) TABS tablet, Take 1 tablet by mouth daily., Disp: , Rfl:    spironolactone (ALDACTONE) 25 MG tablet, Take 1 tablet (25 mg total) by mouth daily., Disp: 90 tablet, Rfl: 3   vitamin B-12 1000 MCG tablet, Take 1 tablet (1,000 mcg total) by mouth daily., Disp: , Rfl:    Objective:     There were no vitals filed for this visit.    There is no height or weight on file to calculate BMI.    Physical Exam:    ***   Electronically signed by:  Aleen Sells D.[facility] 4:36 PM 11/23/22

## 2022-11-24 ENCOUNTER — Ambulatory Visit (INDEPENDENT_AMBULATORY_CARE_PROVIDER_SITE_OTHER): Payer: Medicare Other | Admitting: Sports Medicine

## 2022-11-24 VITALS — BP 120/82 | HR 76 | Ht 72.0 in | Wt 206.0 lb

## 2022-11-24 DIAGNOSIS — M545 Low back pain, unspecified: Secondary | ICD-10-CM

## 2022-11-24 DIAGNOSIS — M5136 Other intervertebral disc degeneration, lumbar region: Secondary | ICD-10-CM | POA: Diagnosis not present

## 2022-11-24 DIAGNOSIS — G8929 Other chronic pain: Secondary | ICD-10-CM

## 2022-11-24 NOTE — Patient Instructions (Addendum)
Tylenol [phone] mg 2-3 times a day for pain relief  Low back HEP  CT myelogram  Follow up 4 days after to discuss results

## 2022-11-26 ENCOUNTER — Other Ambulatory Visit: Payer: Self-pay | Admitting: Sports Medicine

## 2022-11-26 DIAGNOSIS — M545 Low back pain, unspecified: Secondary | ICD-10-CM

## 2022-11-30 IMAGING — CT CT ANGIO HEAD-NECK (W OR W/O PERF)
3 of 7 series · 9 of 35 positions shown · IV contrast (APPLIED)
Comparison: Plain head CT 1761 hours today. CTA head and neck
02/27/2021.

CLINICAL DATA: 75-year-old male code stroke.  Ataxia.

EXAM:
CT ANGIOGRAPHY HEAD AND NECK
TECHNIQUE: Multidetector CT imaging of the head and neck was performed using
the standard protocol during bolus administration of intravenous
contrast. Multiplanar CT image reconstructions and MIPs were
obtained to evaluate the vascular anatomy. Carotid stenosis
measurements (when applicable) are obtained utilizing NASCET
criteria, using the distal internal carotid diameter as the
denominator.

[Series 5: cta neck/head · axial · 0.58mm/px · z∈[-226,-108]mm · 2 of 179 slices shown]
[im 60/179  soft-tissue]
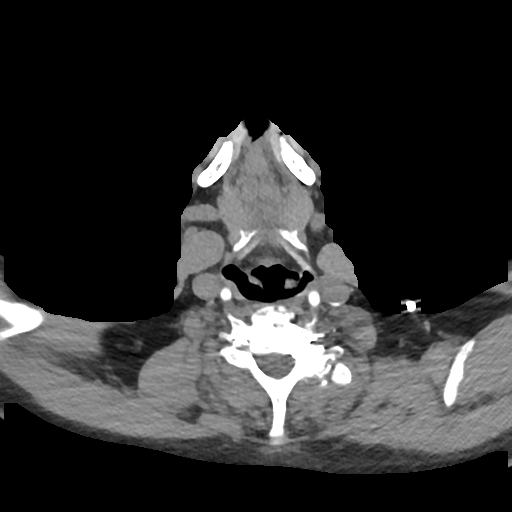
[im 119/179  soft-tissue]
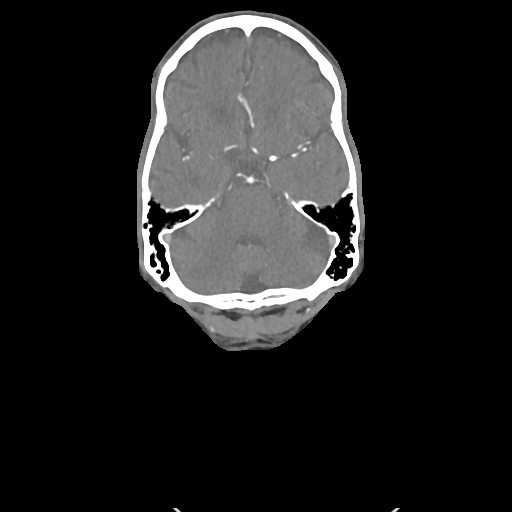

[Series 7: ax thins · axial · 0.39mm/px · z∈[-345,-94]mm · 6 of 369 slices shown]
[im 53/369  soft-tissue]
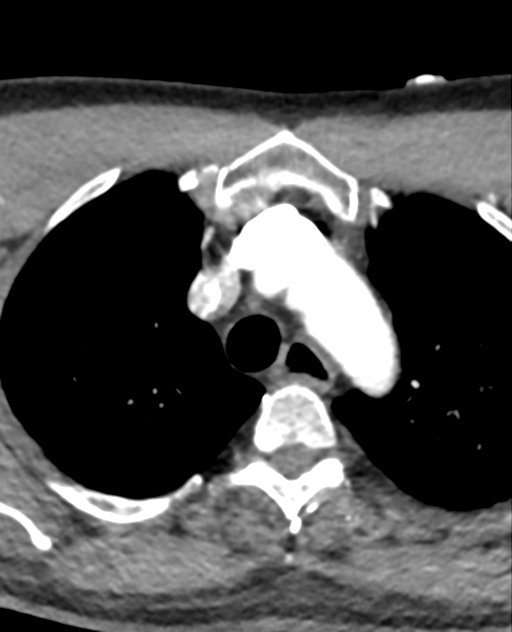
[im 106/369  bone]
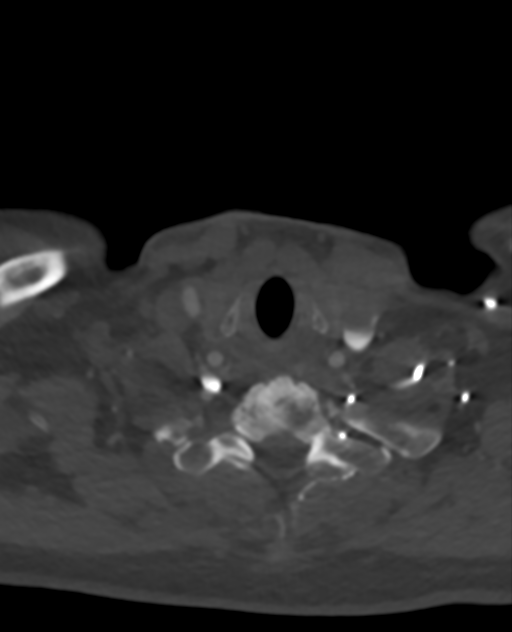
[im 158/369  soft-tissue]
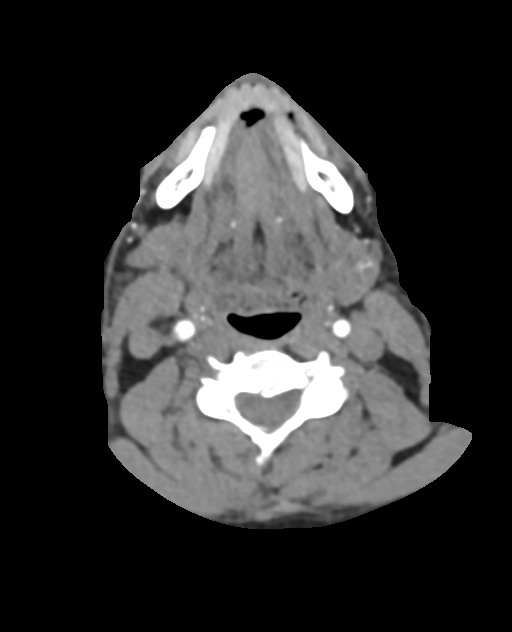
[im 211/369  bone]
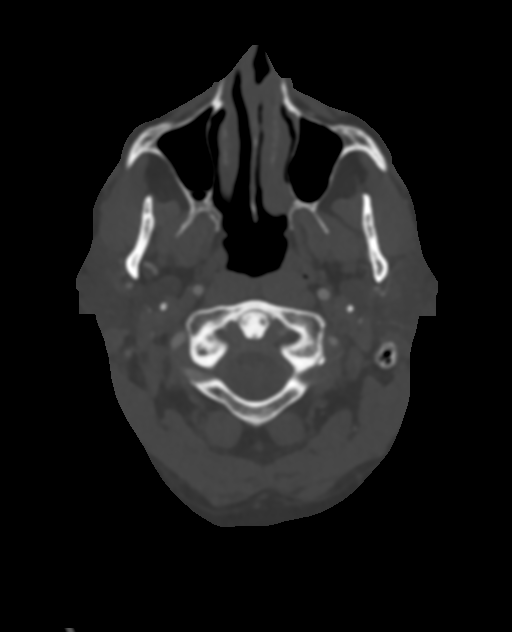
[im 263/369  soft-tissue]
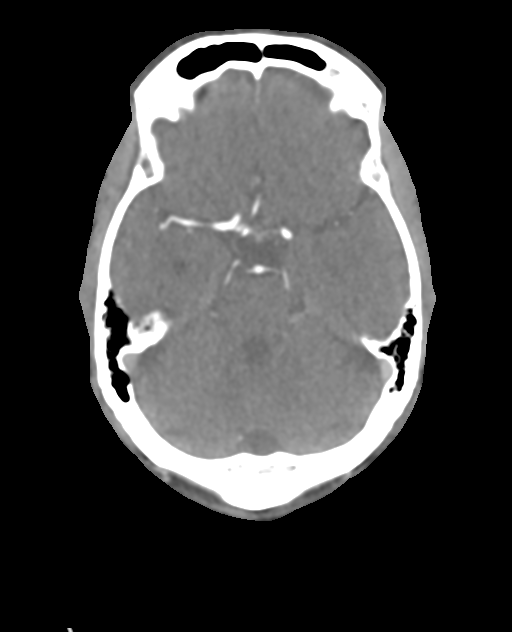
[im 316/369  bone]
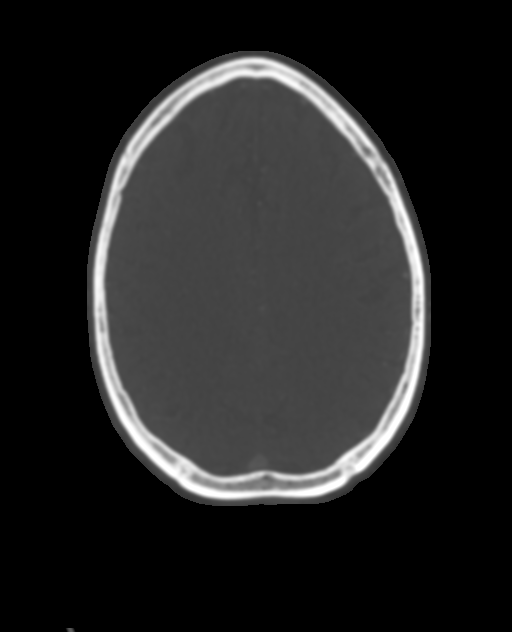

[Series 10: sag thins · sagittal · 0.46mm/px · 1 of 201 slices shown]
[im 127/201  soft-tissue]
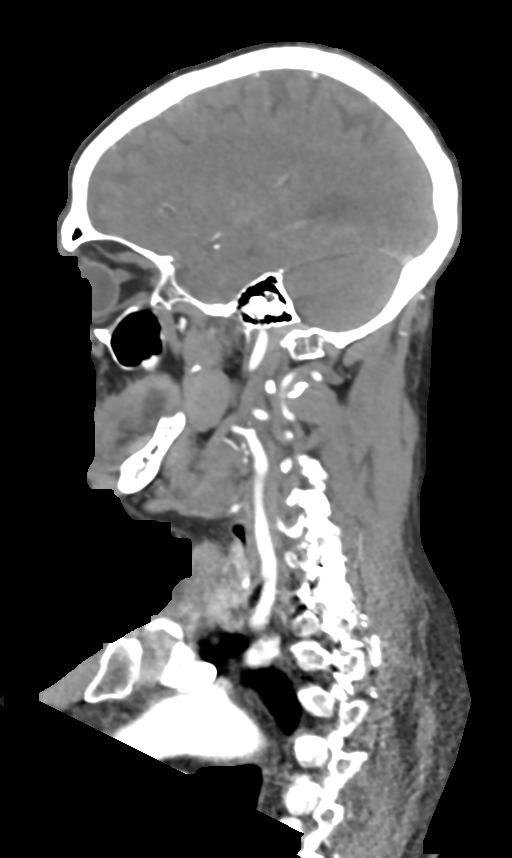

[9 of 35 positions shown; findings below may reference images not displayed]

RADIATION DOSE REDUCTION: This exam was performed according to the
departmental dose-optimization program which includes automated
exposure control, adjustment of the mA and/or kV according to
patient size and/or use of iterative reconstruction technique.

CONTRAST:  100mL OMNIPAQUE IOHEXOL 350 MG/ML SOLN
FINDINGS: CTA NECK

Skeleton: Absent dentition. Chronic cervical spine degeneration
superimposed on C2-C3 ankylosis does not appear significantly
changed from Anna Marta. No acute osseous abnormality identified.

Upper chest: Negative; visible central pulmonary arteries are
patent.

Other neck: No acute soft tissue finding in the neck. Chronic
retained metallic foreign body in the right superior orbit.

Aortic arch: Mildly tortuous aortic arch. Minimal arch
atherosclerosis. Three vessel arch configuration.

Right carotid system: Tortuous brachiocephalic artery and proximal
right CCA but no plaque or stenosis. Negative right carotid
bifurcation. No right ICA stenosis to the skull base.

Left carotid system: Tortuous proximal left CCA with no plaque or
stenosis. Negative left carotid bifurcation. Mildly tortuous left
ICA without stenosis.

Vertebral arteries:
Tortuous proximal right subclavian artery without plaque or
stenosis. Normal right vertebral artery origin. Right V1 and V2
junction of scared by dense paravertebral contrast, but the right
vertebral artery remains patent to the skull base with tortuosity
but no plaque or stenosis identified.

Non dominant left vertebral artery with stable caliber. Tortuous
proximal left subclavian artery without plaque or stenosis. Left
vertebral artery origin appears normal. There is also dense
paravertebral venous contrast obscuring the left proximal V2
segment, but the left vertebral artery remains patent and appears
stable to the skull base with no plaque or stenosis.

CTA HEAD

Posterior circulation: Mildly dominant right V4 segment. Distal
vertebral arteries remain patent with no plaque or stenosis. Patent
bilateral PICA origins and vertebrobasilar junction. Patent basilar
artery without stenosis. Patent SCA and PCA origins. Posterior
communicating arteries are diminutive or absent. Left PCA branches
are stable and within normal limits. Right PCA remains patent
proximally. There is a moderate irregularity and stenosis at the P2
segment on series 11, image 18, but distal PCA branch enhancement is
preserved.

Anterior circulation: Both ICA siphons are patent. No petrous,
cavernous or anterior genu segment stenosis. Both supraclinoid
segments appear mildly tapered which might be a function of the
contrast timing today. There is no significant ICA stenosis. Carotid
termini are patent with stable MCA and ACA origins. Dominant left A1
redemonstrated. Anterior communicating artery and bilateral ACA
branches remain within normal limits. Left MCA M1 segment and
bifurcation are patent without stenosis. Right MCA M1 segment and
trifurcation are patent without stenosis. Bilateral MCA branches are
stable allowing for earlier contrast timing today.

Venous sinuses: Early contrast timing, but there is some enhancement
of the superior sagittal sinus, torcula, transverse and sigmoid
sinuses.

Anatomic variants: Dominant right vertebral artery, left ACA A1
segment.

Review of the MIP images confirms the above findings
IMPRESSION: 1. Negative for large vessel occlusion.

2. Little to no atherosclerosis in the head and neck.
Moderate irregularity of the Right PCA P2 segment which is likely
the sequelae of the Anna Marta infarct in that territory. No other
significant arterial stenosis.

3. These results were communicated to Dr. Diomdes at [DATE] on
07/13/2021 by text page via the [employer] messaging system.

## 2022-11-30 IMAGING — CT CT HEAD CODE STROKE
4 series · 16 of 47 positions shown, 18 images · non-contrast
Comparison: CT head and CTA head and neck 02/27/2021.

CLINICAL DATA: Code stroke. 75-year-old male. Right PCA infarct in
[REDACTED].



[Series 3: head wo · axial · 0.49mm/px · z∈[+1071,+1201]mm · 7 of 36 slices shown, 9 images]
[im 5/36  brain]
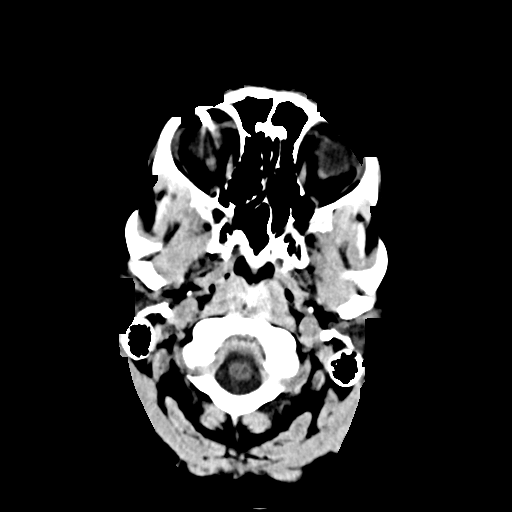
[im 5/36  bone]
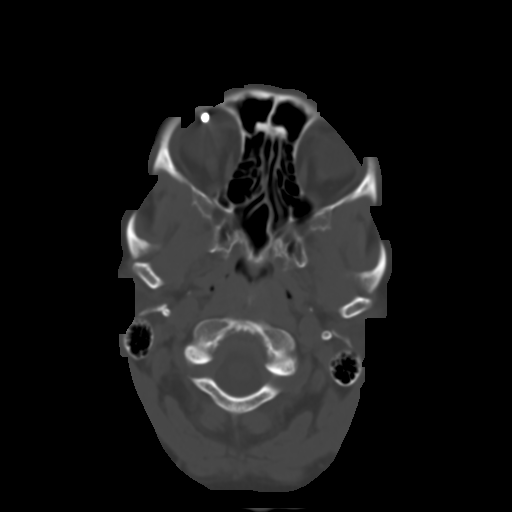
[im 9/36  brain]
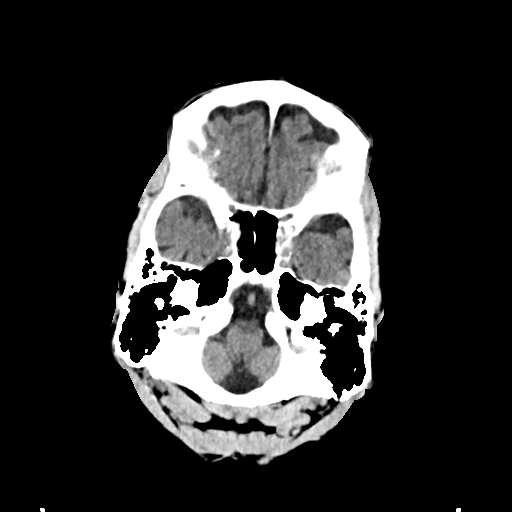
[im 14/36  brain]
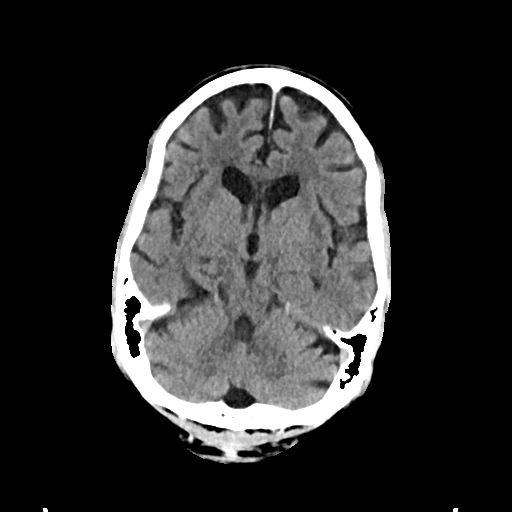
[im 18/36  brain]
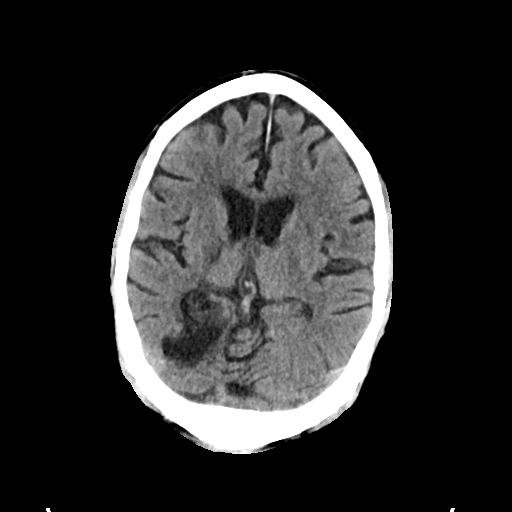
[im 22/36  brain]
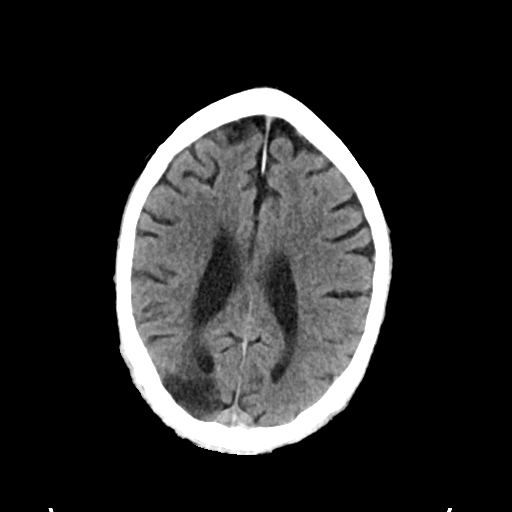
[im 22/36  bone]
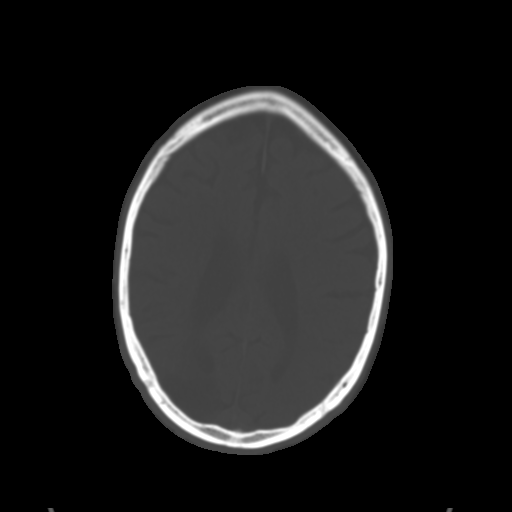
[im 27/36  brain]
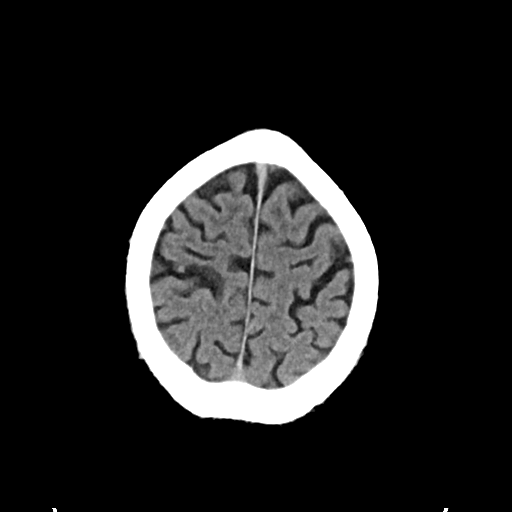
[im 31/36  brain]
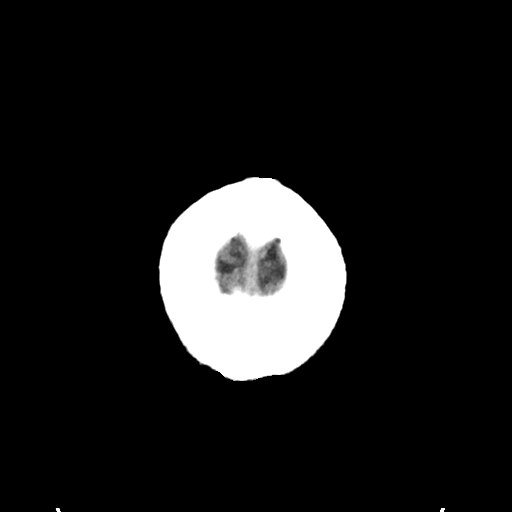

[Series 4: head bone · axial · 0.49mm/px · z∈[+1067,+1103]mm · 3 of 90 slices shown]
[im 9/90  bone]
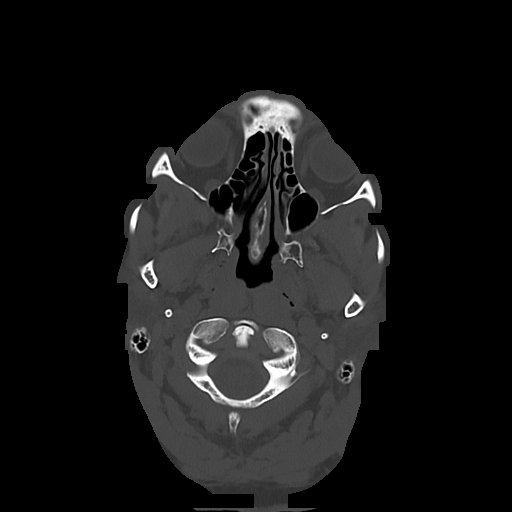
[im 18/90  bone]
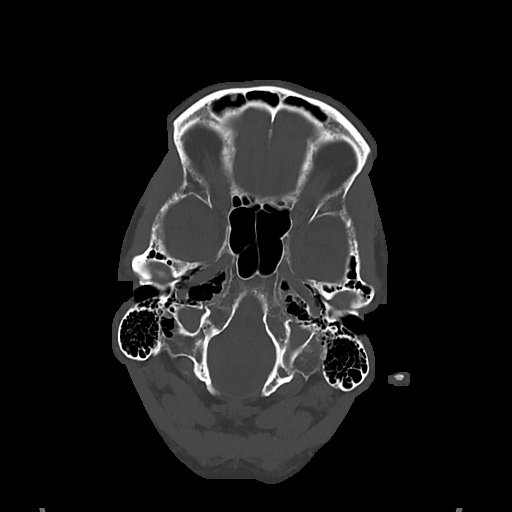
[im 27/90  bone]
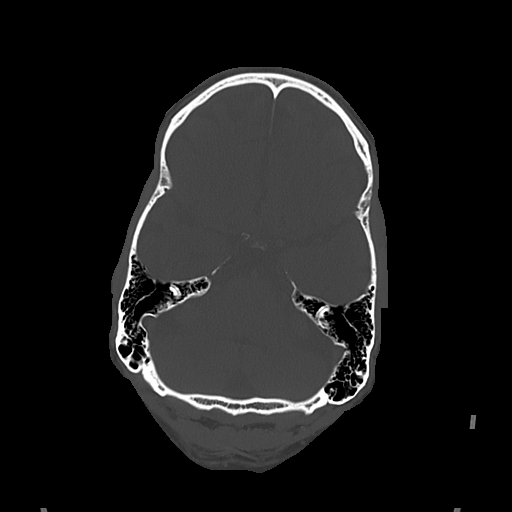

[Series 5: cor soft · coronal · 0.35mm/px · 3 of 71 slices shown]
[im 24/71  brain]
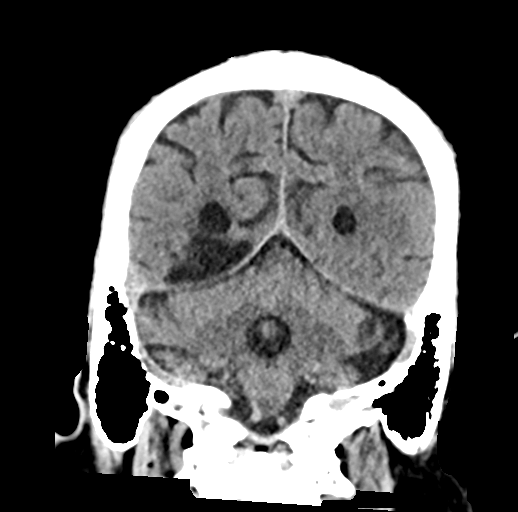
[im 32/71  brain]
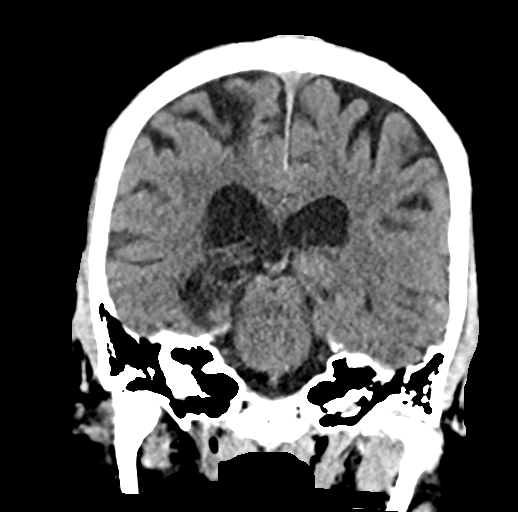
[im 39/71  brain]
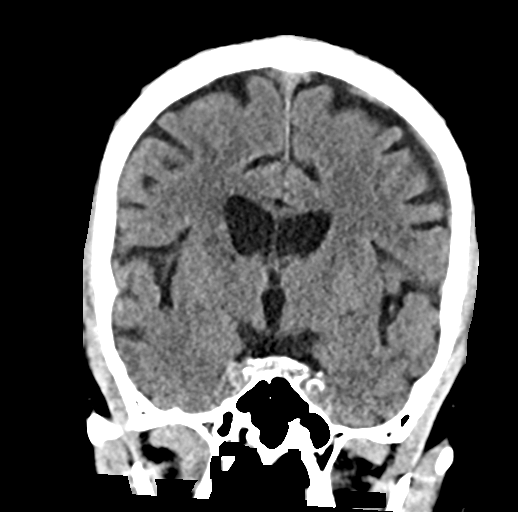

[Series 6: sag soft · sagittal · 0.35mm/px · 3 of 61 slices shown]
[im 21/61  brain]
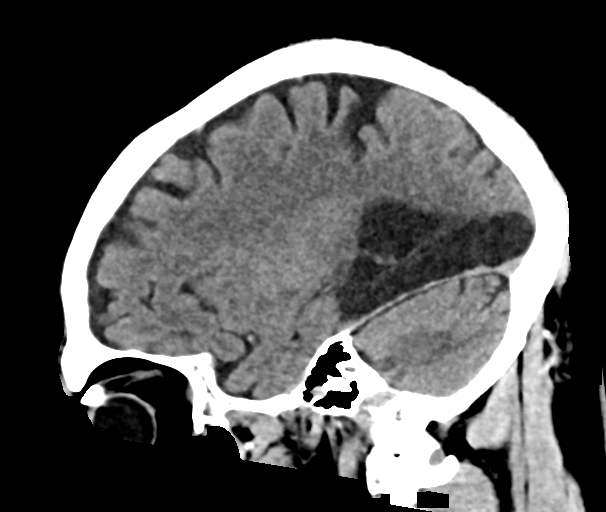
[im 31/61  brain]
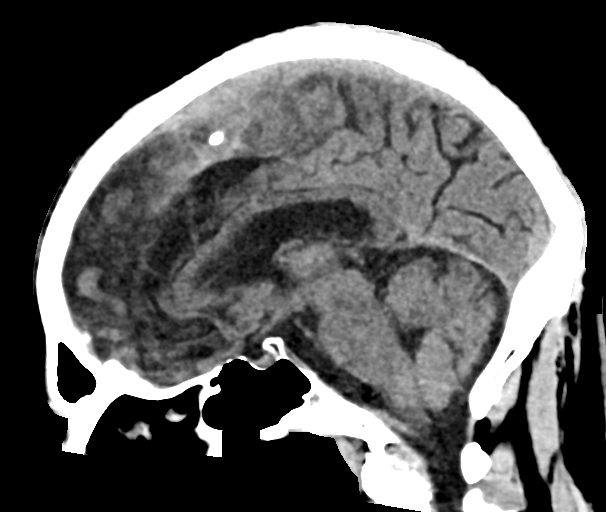
[im 41/61  brain]
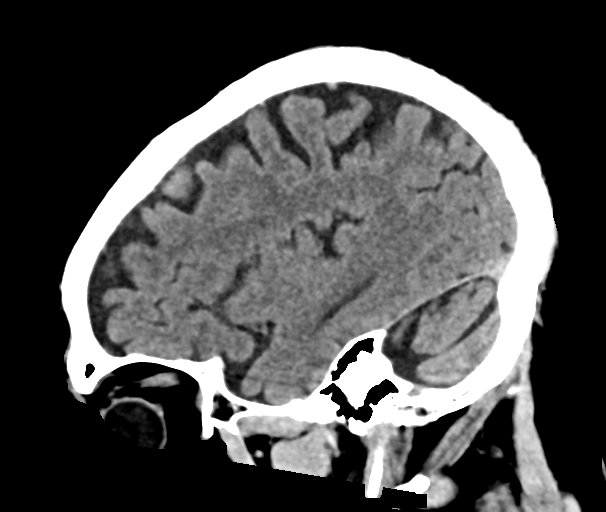

[16 of 47 positions shown; findings below may reference images not displayed]

FINDINGS: Brain: Previous large right PCA territory infarct with
encephalomalacia now. Small area of encephalomalacia in the right
perirolandic cortex (series 3, image 28) has also progressed since
[REDACTED] and may have been subacute at that time.

Evolution also of infarction in the right corona radiata, internal
capsule lateral right thalamus.

No superimposed No midline shift, ventriculomegaly, mass effect,
evidence of mass lesion, intracranial hemorrhage or evidence of
cortically based acute infarction.

Vascular: Calcified atherosclerosis at the skull base. No suspicious
intracranial vascular hyperdensity.

Skull: No acute osseous abnormality identified.

Sinuses/Orbits: Visualized paranasal sinuses and mastoids are clear.

Other: Chronic retained metallic foreign body in the superior right
orbit abutting the globe (series 4, image 11). No gaze deviation,
acute orbit or scalp soft tissue finding.

ASPECTS (Alberta Stroke Program Early CT Score)

Total score (0-10 with 10 being normal): 10 (chronic
encephalomalacia in the right MCA and PCA territories).
IMPRESSION: 1. No acute cortically based infarct or acute intracranial
hemorrhage identified. ASPECTS 10.
2. Evolution of encephalomalacia in both the Right MCA and PCA
territories since [DATE]. Chronic retained metallic foreign body in the superior right
orbit.
4. These results were communicated to Dr. Nya at [DATE] on
07/13/2021 by text page via the [employer] messaging system.

## 2022-12-01 IMAGING — CT CT HEAD W/O CM
4 series · 16 of 47 positions shown, 18 images · non-contrast
Comparison: Head CT yesterday at [DATE] a.m., head CT 02/27/2021.

CLINICAL DATA: Stroke follow-up.



[Series 3: head without · axial · non-contrast · 0.47mm/px · z∈[-108,+32]mm · 7 of 38 slices shown, 9 images]
[im 5/38  brain]
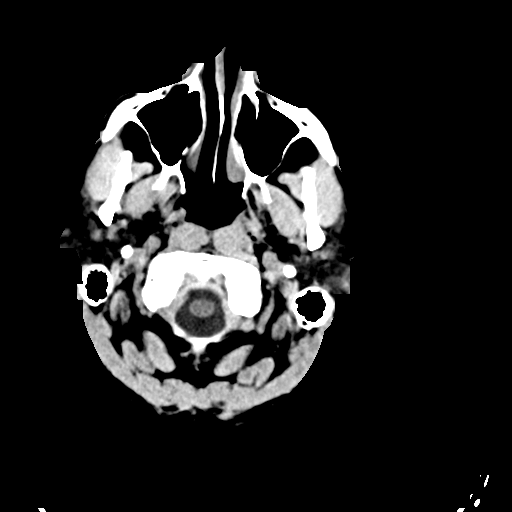
[im 5/38  bone]
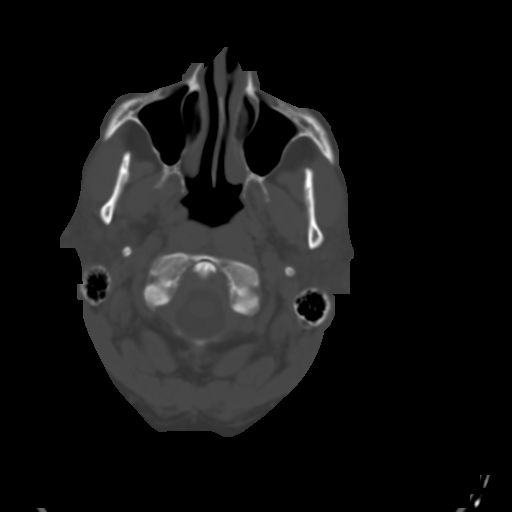
[im 10/38  brain]
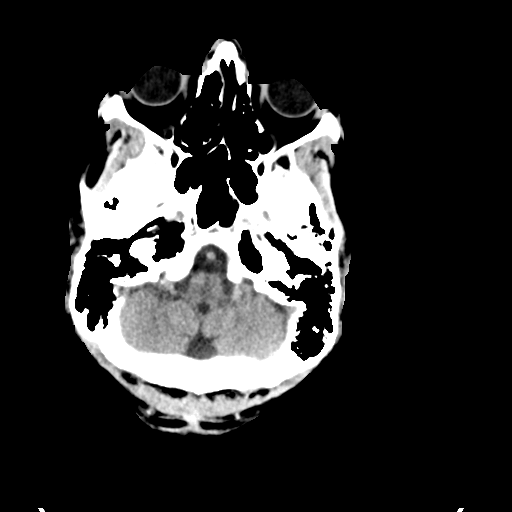
[im 14/38  brain]
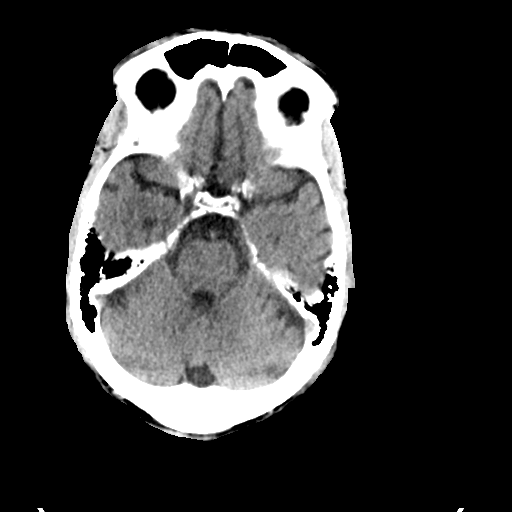
[im 19/38  brain]
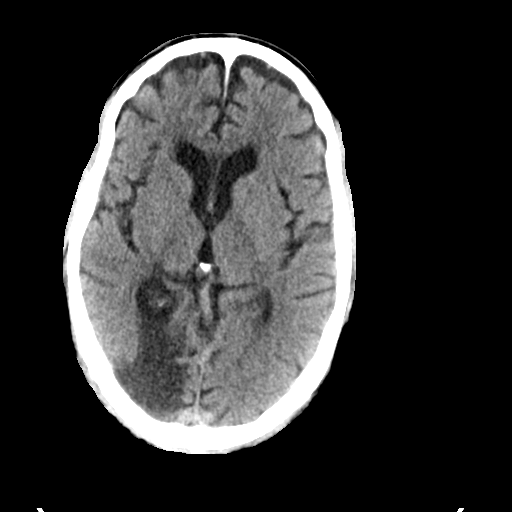
[im 24/38  brain]
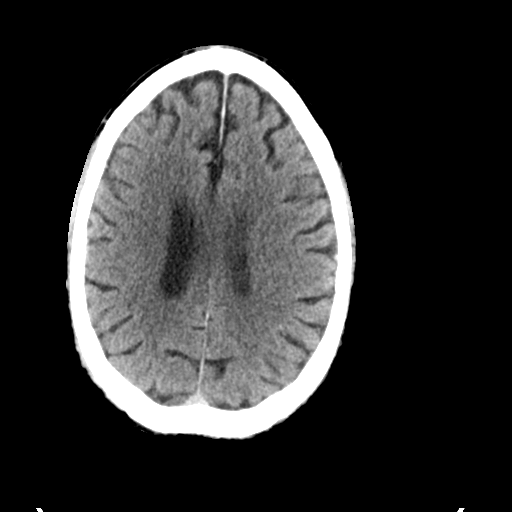
[im 24/38  bone]
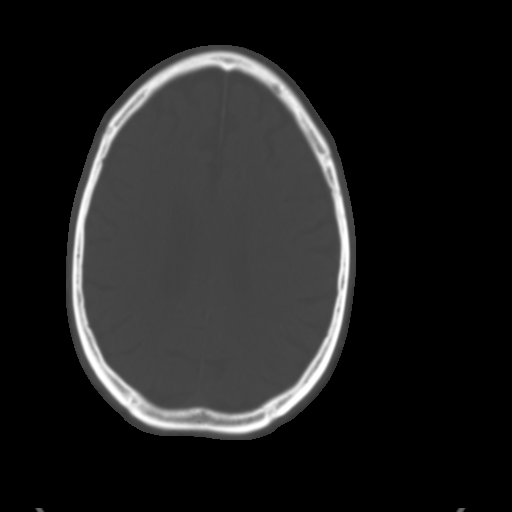
[im 28/38  brain]
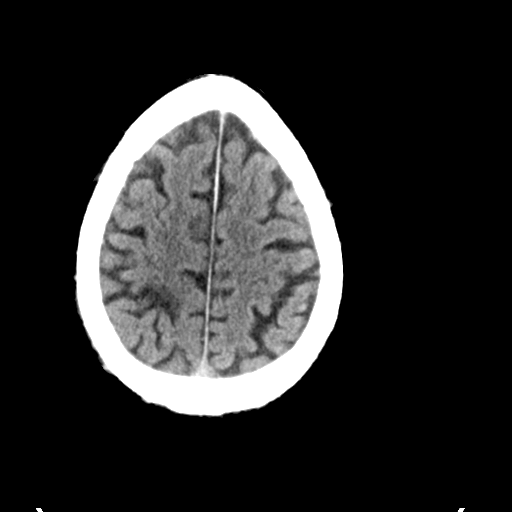
[im 33/38  brain]
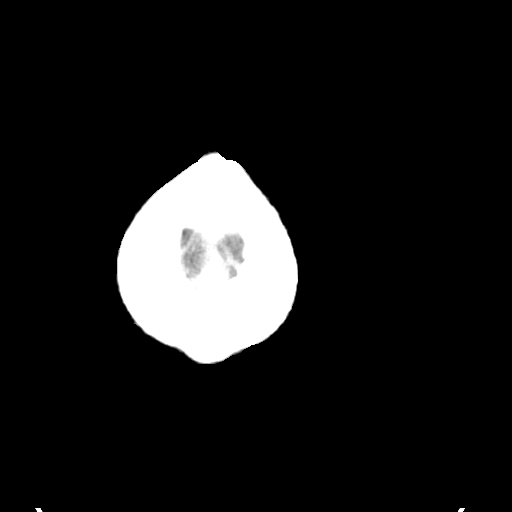

[Series 4: head bone · axial · 0.47mm/px · z∈[-110,-72]mm · 3 of 95 slices shown]
[im 10/95  bone]
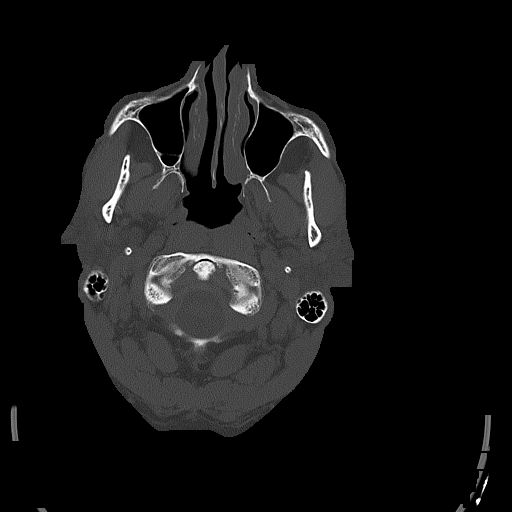
[im 19/95  bone]
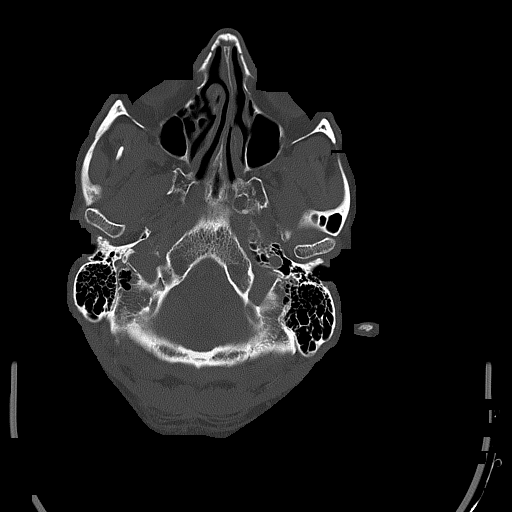
[im 29/95  bone]
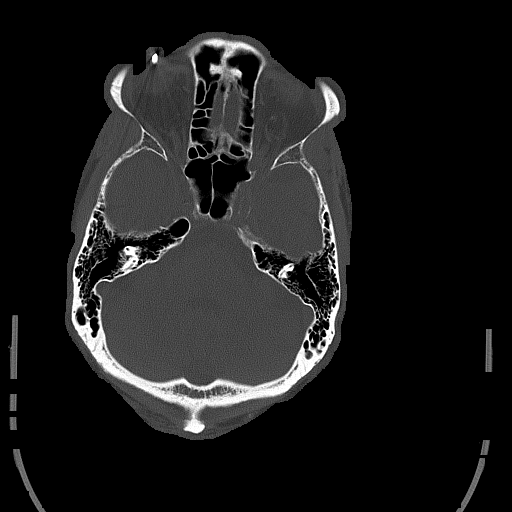

[Series 5: head without cor · coronal · non-contrast · 0.37mm/px · 3 of 69 slices shown]
[im 23/69  brain]
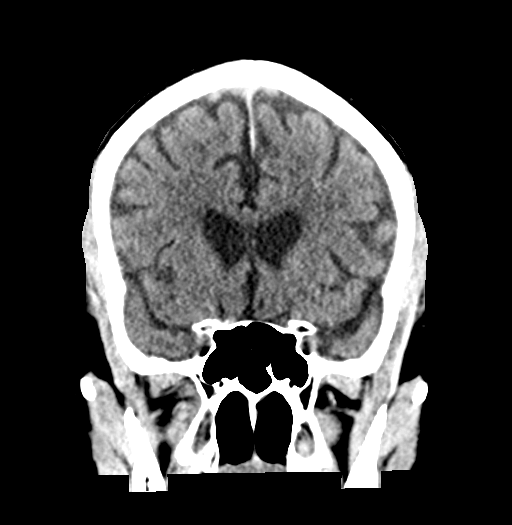
[im 31/69  brain]
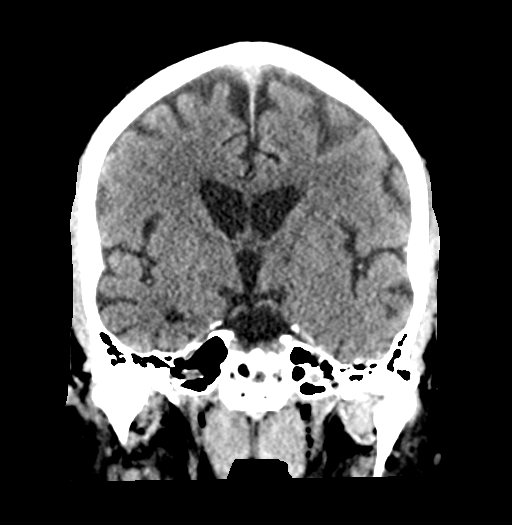
[im 38/69  brain]
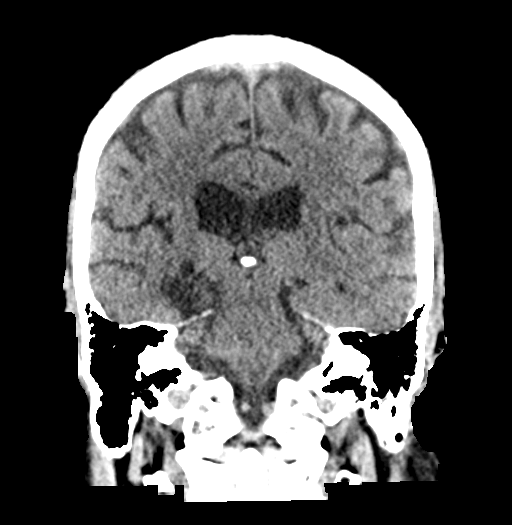

[Series 6: head without sag · sagittal · non-contrast · 0.38mm/px · 3 of 58 slices shown]
[im 20/58  brain]
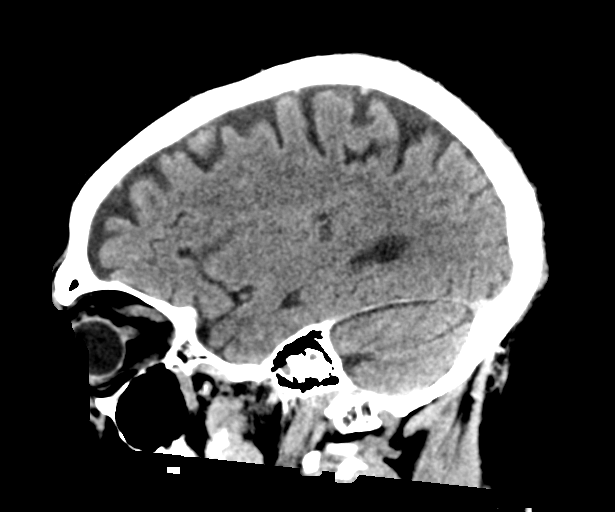
[im 29/58  brain]
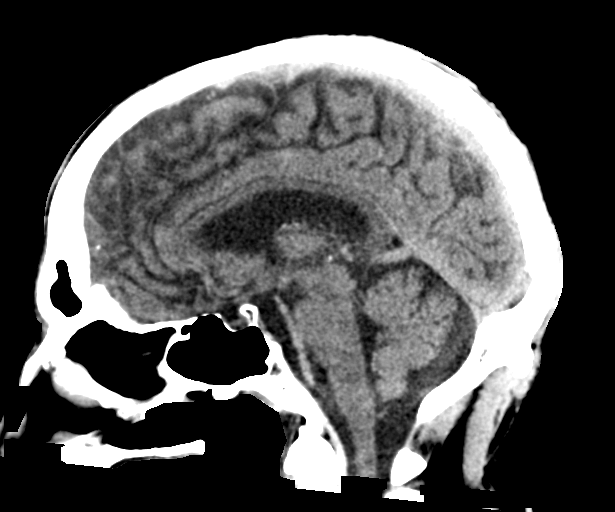
[im 39/58  brain]
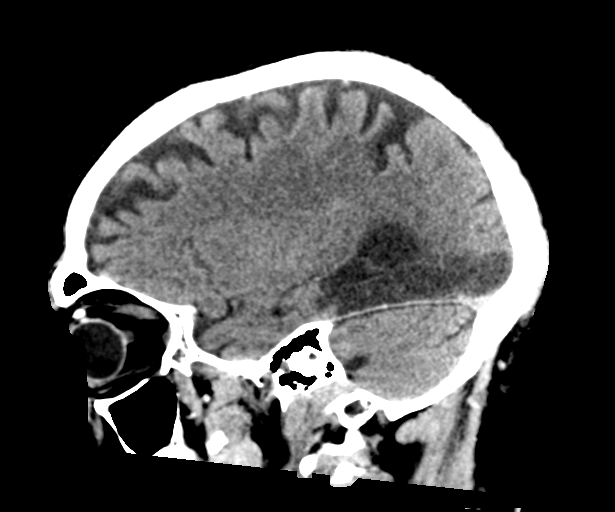

[16 of 47 positions shown; findings below may reference images not displayed]

FINDINGS: Brain: Encephalomalacia is again noted due to a cortical infarct in
the superior right parietal lobe and a broad-based chronic right
occipital lobe infarct.

There are chronic bilateral gangliocapsular lacunar infarcts and old
lacunar infarct at the right thalamocapsular junction.

There is mild atrophy and atrophic ventriculomegaly and
mild-to-moderate small vessel disease of the cerebral white matter.

There are no findings suspicious for acute cortical based infarct,
mass, hemorrhage or midline shift. The cerebellum and brainstem
unremarkable aside from mild cerebellar atrophy.

Vascular: There are calcifications in the carotid siphons but no
hyperdense central vessels.

Skull: Normal. Negative for fracture or focal lesion.

Sinuses/Orbits: No acute abnormality. S shaped nasal septum. Small
metal foreign body in the anterior superior orbit abutting the
globe.

Other: None.
IMPRESSION: Encephalomalacia due to chronic right MCA and PCA territory
infarcts, with old lacunar infarcts as well. There is no appreciable
acute cortical based infarct or bleed. Small chronic retained metal
foreign body in the anterior superior right orbit.

## 2022-12-03 NOTE — Discharge Instructions (Signed)

## 2022-12-06 ENCOUNTER — Ambulatory Visit: Admission: RE | Admit: 2022-12-06 | Payer: Medicare Other | Source: Ambulatory Visit

## 2022-12-06 ENCOUNTER — Ambulatory Visit
Admission: RE | Admit: 2022-12-06 | Discharge: 2022-12-06 | Disposition: A | Discharge status: Home / Self Care | Payer: Medicare Other | Source: Ambulatory Visit | Attending: Sports Medicine | Admitting: Sports Medicine

## 2022-12-06 DIAGNOSIS — M4186 Other forms of scoliosis, lumbar region: Secondary | ICD-10-CM | POA: Diagnosis not present

## 2022-12-06 DIAGNOSIS — M545 Low back pain, unspecified: Secondary | ICD-10-CM | POA: Diagnosis not present

## 2022-12-06 DIAGNOSIS — M4316 Spondylolisthesis, lumbar region: Secondary | ICD-10-CM | POA: Diagnosis not present

## 2022-12-06 DIAGNOSIS — M47896 Other spondylosis, lumbar region: Secondary | ICD-10-CM | POA: Diagnosis not present

## 2022-12-06 DIAGNOSIS — G8929 Other chronic pain: Secondary | ICD-10-CM

## 2022-12-06 MED ORDER — DIAZEPAM 5 MG PO TABS: 5.0000 mg | ORAL_TABLET | Freq: Once | ORAL | Status: DC

## 2022-12-06 MED ORDER — ONDANSETRON HCL 4 MG/2ML IJ SOLN: 4.0000 mg | Freq: Once | INTRAMUSCULAR | Status: DC | PRN

## 2022-12-06 MED ORDER — IOPAMIDOL (ISOVUE-M 200) INJECTION 41%
20.0000 mL | Freq: Once | INTRAMUSCULAR | Status: AC
  Administered 2022-12-06: 20 mL via INTRATHECAL

## 2022-12-06 MED ORDER — MEPERIDINE HCL 50 MG/ML IJ SOLN: 50.0000 mg | Freq: Once | INTRAMUSCULAR | Status: DC | PRN

## 2022-12-20 DIAGNOSIS — E559 Vitamin D deficiency, unspecified: Secondary | ICD-10-CM | POA: Diagnosis not present

## 2022-12-20 DIAGNOSIS — I10 Essential (primary) hypertension: Secondary | ICD-10-CM | POA: Diagnosis not present

## 2022-12-20 DIAGNOSIS — I639 Cerebral infarction, unspecified: Secondary | ICD-10-CM | POA: Diagnosis not present

## 2022-12-20 DIAGNOSIS — E538 Deficiency of other specified B group vitamins: Secondary | ICD-10-CM | POA: Diagnosis not present

## 2023-01-11 DIAGNOSIS — F411 Generalized anxiety disorder: Secondary | ICD-10-CM | POA: Diagnosis not present

## 2023-01-11 DIAGNOSIS — F330 Major depressive disorder, recurrent, mild: Secondary | ICD-10-CM | POA: Diagnosis not present

## 2023-01-19 ENCOUNTER — Other Ambulatory Visit: Payer: Self-pay | Admitting: Physician Assistant

## 2023-01-19 NOTE — Telephone Encounter (Signed)
Please advise if ok to refill. 

## 2023-03-21 DIAGNOSIS — E559 Vitamin D deficiency, unspecified: Secondary | ICD-10-CM | POA: Diagnosis not present

## 2023-03-21 DIAGNOSIS — E538 Deficiency of other specified B group vitamins: Secondary | ICD-10-CM | POA: Diagnosis not present

## 2023-03-21 DIAGNOSIS — I10 Essential (primary) hypertension: Secondary | ICD-10-CM | POA: Diagnosis not present

## 2023-03-21 DIAGNOSIS — E782 Mixed hyperlipidemia: Secondary | ICD-10-CM | POA: Diagnosis not present

## 2023-03-21 DIAGNOSIS — Z8679 Personal history of other diseases of the circulatory system: Secondary | ICD-10-CM | POA: Diagnosis not present

## 2023-03-21 DIAGNOSIS — I639 Cerebral infarction, unspecified: Secondary | ICD-10-CM | POA: Diagnosis not present

## 2023-04-18 ENCOUNTER — Other Ambulatory Visit: Payer: Self-pay | Admitting: Physician Assistant

## 2023-04-19 DIAGNOSIS — F411 Generalized anxiety disorder: Secondary | ICD-10-CM | POA: Diagnosis not present

## 2023-04-19 DIAGNOSIS — F330 Major depressive disorder, recurrent, mild: Secondary | ICD-10-CM | POA: Diagnosis not present

## 2023-05-16 ENCOUNTER — Encounter: Payer: Self-pay | Admitting: Physician Assistant

## 2023-05-16 ENCOUNTER — Ambulatory Visit: Payer: Medicare Other | Admitting: Physician Assistant

## 2023-05-16 VITALS — BP 144/76 | HR 58 | Temp 97.9°F | Ht 72.0 in | Wt 200.2 lb

## 2023-05-16 DIAGNOSIS — I10 Essential (primary) hypertension: Secondary | ICD-10-CM

## 2023-05-16 DIAGNOSIS — N1832 Chronic kidney disease, stage 3b: Secondary | ICD-10-CM | POA: Diagnosis not present

## 2023-05-16 DIAGNOSIS — Z85038 Personal history of other malignant neoplasm of large intestine: Secondary | ICD-10-CM

## 2023-05-16 DIAGNOSIS — G4701 Insomnia due to medical condition: Secondary | ICD-10-CM | POA: Diagnosis not present

## 2023-05-16 DIAGNOSIS — E782 Mixed hyperlipidemia: Secondary | ICD-10-CM | POA: Diagnosis not present

## 2023-05-16 DIAGNOSIS — M47819 Spondylosis without myelopathy or radiculopathy, site unspecified: Secondary | ICD-10-CM | POA: Diagnosis not present

## 2023-05-16 DIAGNOSIS — D649 Anemia, unspecified: Secondary | ICD-10-CM | POA: Diagnosis not present

## 2023-05-16 LAB — LIPID PANEL
Cholesterol: 122 mg/dL (ref 0–200)
HDL: 45.1 mg/dL (ref >39.00)
LDL Cholesterol: 49 mg/dL (ref 0–99)
NonHDL: 77.35
Total CHOL/HDL Ratio: 3
Triglycerides: 144 mg/dL (ref 0.0–149.0)
VLDL: 28.8 mg/dL (ref 0.0–40.0)

## 2023-05-16 LAB — COMPREHENSIVE METABOLIC PANEL
ALT: 18 U/L (ref 0–53)
AST: 28 U/L (ref 0–37)
Albumin: 4 g/dL (ref 3.5–5.2)
Alkaline Phosphatase: 81 U/L (ref 39–117)
BUN: 20 mg/dL (ref 6–23)
CO2: 23 meq/L (ref 19–32)
Calcium: 9.3 mg/dL (ref 8.4–10.5)
Chloride: 108 meq/L (ref 96–112)
Creatinine, Ser: 2.21 mg/dL — ABNORMAL HIGH (ref 0.40–1.50)
GFR: 27.64 mL/min — ABNORMAL LOW (ref >60.00)
Glucose, Bld: 83 mg/dL (ref 70–99)
Potassium: 4.3 meq/L (ref 3.5–5.1)
Sodium: 139 meq/L (ref 135–145)
Total Bilirubin: 0.7 mg/dL (ref 0.2–1.2)
Total Protein: 7.6 g/dL (ref 6.0–8.3)

## 2023-05-16 LAB — CBC WITH DIFFERENTIAL/PLATELET
Basophils Absolute: 0 10*3/uL (ref 0.0–0.1)
Basophils Relative: 0.3 % (ref 0.0–3.0)
Eosinophils Absolute: 0.3 10*3/uL (ref 0.0–0.7)
Eosinophils Relative: 3.8 % (ref 0.0–5.0)
HCT: 38.4 % — ABNORMAL LOW (ref 39.0–52.0)
Hemoglobin: 12.4 g/dL — ABNORMAL LOW (ref 13.0–17.0)
Lymphocytes Relative: 21 % (ref 12.0–46.0)
Lymphs Abs: 1.9 10*3/uL (ref 0.7–4.0)
MCHC: 32.2 g/dL (ref 30.0–36.0)
MCV: 90.4 fL (ref 78.0–100.0)
Monocytes Absolute: 0.4 10*3/uL (ref 0.1–1.0)
Monocytes Relative: 5 % (ref 3.0–12.0)
Neutro Abs: 6.2 10*3/uL (ref 1.4–7.7)
Neutrophils Relative %: 69.9 % (ref 43.0–77.0)
Platelets: 314 10*3/uL (ref 150.0–400.0)
RBC: 4.25 Mil/uL (ref 4.22–5.81)
RDW: 14.8 % (ref 11.5–15.5)
WBC: 8.9 10*3/uL (ref 4.0–10.5)

## 2023-05-16 NOTE — Patient Instructions (Addendum)
Please call [facility] to schedule your colonoscopy. [phone]   VISIT SUMMARY:  During today's visit, we discussed your ongoing issues with insomnia, hypertension, chronic kidney disease, and your history of colon and prostate cancer. We also reviewed your recent back issues and general health maintenance. You reported that your mood is stable, your heart is doing well, and you are comfortable with your current weight. We have outlined a plan to address each of these concerns and ensure you continue to manage your health effectively.  YOUR PLAN:  -INSOMNIA: Insomnia is difficulty falling or staying asleep. You mentioned that your active mind prevents you from falling asleep. We recommend trying journaling before bed to help clear your mind and continuing to use melatonin as needed.  -HYPERTENSION: Hypertension is high blood pressure. Your home readings are typically in the 130s/70s. Please continue your current blood pressure medications and monitor your blood pressure at home. If your readings are consistently above 140/90, please report this.  -CHRONIC KIDNEY DISEASE: Chronic kidney disease is a long-term condition where the kidneys do not work as well as they should. Your last kidney function test in August showed decreased function. We will draw labs today to check your current kidney function and consider re-establishing care with your kidney specialist.  -COLON CANCER, HISTORY OF: You have a history of colon cancer treated in 2009. It has been 7-8 years since your last colonoscopy. We will refer you to a gastroenterologist for a consultation and possible colonoscopy.  -PROSTATE CANCER, HISTORY OF: You have a history of prostate cancer treated with radiation in 2010. You have no current urinary symptoms. We will continue with your current management plan.  -LUMBAR SPINAL STENOSIS: Lumbar spinal stenosis is a narrowing of the spaces within your spine, which can cause pain. Your recent  myelogram showed significant stenosis, but your pain has improved. Continue with your current management, and consider following up with an orthopedic specialist if the pain worsens.  -GENERAL HEALTH MAINTENANCE: We will check your cholesterol levels today as it has been a year since they were last checked. We plan to follow up with you in 6 months.  INSTRUCTIONS:  Please follow up with your nephrologist to re-establish care for your chronic kidney disease. Additionally, we will refer you to a gastroenterologist for a consultation and possible colonoscopy. Continue monitoring your blood pressure at home and report any consistent readings above 140/90. We will follow up with you in 6 months.

## 2023-05-16 NOTE — Progress Notes (Signed)
Patient ID: George [name]., male    DOB: [birth year], 78 y.o.   MRN: [id number]   Assessment & Plan:  Chronic kidney disease, stage 3b (HCC) -     Comprehensive metabolic panel  Essential hypertension -     CBC with Differential/Platelet -     Comprehensive metabolic panel  Anemia, unspecified type -     CBC with Differential/Platelet -     Comprehensive metabolic panel  Mixed hyperlipidemia -     Lipid panel  History of colon cancer -     Ambulatory referral to Gastroenterology  Insomnia due to medical condition  Arthritis of low back   Assessment and Plan    Insomnia Difficulty falling asleep due to active mind. No use of sleep medications. Currently taking melatonin. -Try journaling before bed to help clear the mind. -Continue melatonin as needed.  Hypertension Reports blood pressure readings at home are typically in the 130s/70s. No changes in medication reported. -Continue current antihypertensive regimen. -Monitor blood pressure at home and report if consistently >140/90. -Follows with cardiology   Chronic Kidney Disease Last labs in August showed decreased kidney function. No recent follow-up with nephrologist. -Draw labs today to check current kidney function. -Consider re-establishing care with nephrologist.  Colon Cancer, history of Last colonoscopy 7-8 years ago (no reports on file). No recent follow-up with gastroenterologist. -Refer to gastroenterology for consultation and possible colonoscopy.  Prostate Cancer, history of Treated with radiation in 2010. No current urinary symptoms. -Continue current management.  Lumbar Spinal Stenosis Recent myelogram showed significant stenosis. Pain has improved since the procedure. -Continue current management. -Consider follow-up with orthopedic specialist if pain worsens.  General Health Maintenance -Check cholesterol levels today as it has been a year since last checked. -Plan follow-up in 6 months.          Return in about 6 months (around 11/13/2023) for recheck/follow-up.    Subjective:    Chief Complaint  Patient presents with   Medical Management of Chronic Issues    Pt in office for follow up to recheck BP and discuss sleep issues/anxiety; Pt states sleep is about the same as previous appt;     HPI  History of Present Illness   He attributes this to constant thinking and making mental lists, which he believes prevents him from falling asleep. He denies using sleep medications and expresses a general dislike for taking medications unless absolutely necessary. He does, however, take melatonin for sleep. He has not had any recent visits with specialists, but he has seen a doctor for his back and another for anxiety. He reports that his mood has been stable and he feels that his current regimen is effective. He also sees a heart doctor and reports that his heart is doing well. He has not seen his kidney specialist in a while and acknowledges that he needs to do so. His last kidney function check was in August and it was low. He denies any stomach pain or changes in urination. He also reports that his blood pressure is usually good at home, but tends to go up when he visits the doctor. He has lost some weight and feels comfortable at his current weight. He reports that he had colon cancer in 2009 and prostate cancer in 2010, both of which were treated and are considered cured. He has not had any recent colonoscopies and expresses a need to have one. He also reports that he had radiation for his prostate cancer and  has not had any urinary issues since.       Past Medical History:  Diagnosis Date   Anxiety    Arthritis    OSTEO IN KNEE   Asthma    as child   Colon cancer (HCC) 2009   chemotherapy   Depression    Elevated prostate specific antigen (PSA)    Generalized anxiety disorder 04/09/2015   Hematospermia    Hx antineoplastic chemotherapy 2010   Hypertension     Idiopathic peripheral neuropathy    TOES OF BOTH FEET DUE TO CHEMO   Incomplete bladder emptying    Malignant neoplasm of prostate (HCC) 2010   radiation   Nodular prostate with lower urinary tract symptoms    Panic attacks 04/09/2015   Primary localized osteoarthrosis of the knee, right 05/19/2016   Prostatitis    S/P total knee arthroplasty, left 05/19/2016   Spermatocele    Weak urinary stream     Past Surgical History:  Procedure Laterality Date   COLON RESECTION   NOV 2009   COLONOSCOPY     PROSTATE BIOPSY     SPERMATOCELECTOMY Left 05/07/2014   Procedure: LEFT SPERMATOCELECTOMY;  Surgeon: Anner Crete, MD;  Location: [facility];  Service: Urology;  Laterality: Left;   TONSILLECTOMY     TOTAL KNEE ARTHROPLASTY Left 04/21/2015   Procedure: LEFT TOTAL KNEE ARTHROPLASTY;  Surgeon: Salvatore Marvel, MD;  Location: [facility] OR;  Service: Orthopedics;  Laterality: Left;   TOTAL KNEE ARTHROPLASTY Right 05/31/2016   Procedure: TOTAL KNEE ARTHROPLASTY;  Surgeon: Salvatore Marvel, MD;  Location: [facility] OR;  Service: Orthopedics;  Laterality: Right;   TRANSURETHRAL RESECTION OF PROSTATE N/A 05/07/2014   Procedure: TRANSURETHRAL RESECTION OF THE PROSTATE WITH GYRUS INSTRUMENTS;  Surgeon: Anner Crete, MD;  Location: [facility];  Service: Urology;  Laterality: N/A;    Family History  Problem Relation Age of Onset   Stroke Father        age 48   Cancer Brother        neoplasm of brain   Restless legs syndrome Neg Hx    Sleep apnea Neg Hx     Social History   Tobacco Use   Smoking status: Former    Current packs/day: 0.00    Average packs/day: 0.3 packs/day for 3.0 years (0.8 ttl pk-yrs)    Types: Cigarettes    Start date: 04/12/1969    Quit date: 04/12/1972    Years since quitting: 51.1   Smokeless tobacco: Never   Tobacco comments:    smoked 1 cigarette per day SOME DAYS  Vaping Use   Vaping status: Never Used  Substance Use Topics   Alcohol use: Yes    Alcohol/week: 1.0 standard drink of alcohol     Types: 1 Cans of beer per week    Comment: OCCASIONAL   Drug use: No     Allergies  Allergen Reactions   Amlodipine Other (See Comments)    Dizziness    Gabapentin     hallucinations    Review of Systems NEGATIVE UNLESS OTHERWISE INDICATED IN HPI      Objective:     BP (!) 144/76 (BP Location: Left Arm, Patient Position: Sitting, Cuff Size: Normal)   Pulse (!) 58   Temp 97.9 F (36.6 C) (Temporal)   Ht 6' (1.829 m)   Wt 200 lb 3.2 oz (90.8 kg)   SpO2 97%   BMI 27.15 kg/m   Wt Readings from Last 3 Encounters:  05/16/23 200  lb 3.2 oz (90.8 kg)  11/24/22 206 lb (93.4 kg)  11/12/22 206 lb 12.8 oz (93.8 kg)    BP Readings from Last 3 Encounters:  05/16/23 (!) 144/76  12/06/22 (!) 149/75  11/24/22 120/82     Physical Exam Vitals and nursing note reviewed.  Constitutional:      General: He is not in acute distress.    Appearance: Normal appearance. He is not ill-appearing.  HENT:     Head: Normocephalic.  Eyes:     Extraocular Movements: Extraocular movements intact.     Conjunctiva/sclera: Conjunctivae normal.     Pupils: Pupils are equal, round, and reactive to light.  Cardiovascular:     Rate and Rhythm: Normal rate and regular rhythm.     Pulses: Normal pulses.     Heart sounds: Normal heart sounds. No murmur heard. Pulmonary:     Effort: Pulmonary effort is normal. No respiratory distress.     Breath sounds: Normal breath sounds. No wheezing.  Musculoskeletal:     Cervical back: Normal range of motion.  Skin:    General: Skin is warm.  Neurological:     Mental Status: He is alert and oriented to person, place, and time.  Psychiatric:        Mood and Affect: Mood normal.        Behavior: Behavior normal.         Elnoria Livingston M Yamilett Anastos, PA-C

## 2023-05-18 ENCOUNTER — Encounter: Payer: Self-pay | Admitting: Physician Assistant

## 2023-06-20 DIAGNOSIS — I10 Essential (primary) hypertension: Secondary | ICD-10-CM | POA: Diagnosis not present

## 2023-06-20 DIAGNOSIS — I639 Cerebral infarction, unspecified: Secondary | ICD-10-CM | POA: Diagnosis not present

## 2023-06-20 DIAGNOSIS — E782 Mixed hyperlipidemia: Secondary | ICD-10-CM | POA: Diagnosis not present

## 2023-06-20 DIAGNOSIS — Z8679 Personal history of other diseases of the circulatory system: Secondary | ICD-10-CM | POA: Diagnosis not present

## 2023-06-28 ENCOUNTER — Other Ambulatory Visit: Payer: Self-pay

## 2023-06-28 ENCOUNTER — Emergency Department (HOSPITAL_COMMUNITY)
Admission: EM | Admit: 2023-06-28 | Discharge: 2023-06-28 | Disposition: A | Discharge status: Home / Self Care | Attending: Emergency Medicine | Admitting: Emergency Medicine

## 2023-06-28 ENCOUNTER — Emergency Department (HOSPITAL_COMMUNITY)

## 2023-06-28 ENCOUNTER — Encounter (HOSPITAL_COMMUNITY): Payer: Self-pay

## 2023-06-28 DIAGNOSIS — R0789 Other chest pain: Secondary | ICD-10-CM | POA: Insufficient documentation

## 2023-06-28 DIAGNOSIS — Z7901 Long term (current) use of anticoagulants: Secondary | ICD-10-CM | POA: Diagnosis not present

## 2023-06-28 DIAGNOSIS — Z79899 Other long term (current) drug therapy: Secondary | ICD-10-CM | POA: Insufficient documentation

## 2023-06-28 DIAGNOSIS — R079 Chest pain, unspecified: Secondary | ICD-10-CM | POA: Diagnosis not present

## 2023-06-28 DIAGNOSIS — I10 Essential (primary) hypertension: Secondary | ICD-10-CM | POA: Insufficient documentation

## 2023-06-28 LAB — CBC
HCT: 35.8 % — ABNORMAL LOW (ref 39.0–52.0)
Hemoglobin: 11.7 g/dL — ABNORMAL LOW (ref 13.0–17.0)
MCH: 29.5 pg (ref 26.0–34.0)
MCHC: 32.7 g/dL (ref 30.0–36.0)
MCV: 90.4 fL (ref 80.0–100.0)
Platelets: 285 10*3/uL (ref 150–400)
RBC: 3.96 MIL/uL — ABNORMAL LOW (ref 4.22–5.81)
RDW: 14.2 % (ref 11.5–15.5)
WBC: 10 10*3/uL (ref 4.0–10.5)
nRBC: 0 % (ref 0.0–0.2)

## 2023-06-28 LAB — BASIC METABOLIC PANEL
Anion gap: 6 (ref 5–15)
BUN: 20 mg/dL (ref 8–23)
CO2: 20 mmol/L — ABNORMAL LOW (ref 22–32)
Calcium: 8.5 mg/dL — ABNORMAL LOW (ref 8.9–10.3)
Chloride: 112 mmol/L — ABNORMAL HIGH (ref 98–111)
Creatinine, Ser: 2.02 mg/dL — ABNORMAL HIGH (ref 0.61–1.24)
GFR, Estimated: 33 mL/min — ABNORMAL LOW (ref >60)
Glucose, Bld: 90 mg/dL (ref 70–99)
Potassium: 4.2 mmol/L (ref 3.5–5.1)
Sodium: 138 mmol/L (ref 135–145)

## 2023-06-28 LAB — TROPONIN I (HIGH SENSITIVITY)
Troponin I (High Sensitivity): 5 ng/L (ref <18)
Troponin I (High Sensitivity): 6 ng/L (ref <18)

## 2023-06-28 LAB — CBG MONITORING, ED: Glucose-Capillary: 102 mg/dL — ABNORMAL HIGH (ref 70–99)

## 2023-06-28 NOTE — ED Provider Notes (Signed)
 [place] [facility] AT [facility] Provider Note   CSN: [id number] Arrival date & time: 06/28/23  1105     History  Chief Complaint  Patient presents with   Chest Pain    George [name]. is a 78 y.o. male.  78 yo M with a chief complaint of chest pain.  It something has been off and on for a few days but he said it was much worse about 45 minutes ago he was walking and suddenly felt a sharp pain he thought right over his heart.  Lasted for a few seconds and resolved.  He then decided come into the department for evaluation.  Patient denies history of MI, Denies DM, smoking.  Denies family history of MI. Hx of HTN, HLD  Patient denies history of PE or DVT denies hemoptysis denies unilateral lower extremity edema denies recent surgery immobilization hospitalization estrogen use or history of cancer.          Home Medications Prior to Admission medications   Medication Sig Start Date End Date Taking? Authorizing Provider  acetaminophen (TYLENOL) 500 MG tablet Take 500 mg by mouth every 6 (six) hours as needed for mild pain or headache.   Yes [provider]  amiodarone (PACERONE) 200 MG tablet Take 100 mg by mouth daily. 05/20/22  Yes [provider]  atorvastatin (LIPITOR) 20 MG tablet Take 20 mg by mouth daily. 06/01/21  Yes [provider]  buPROPion (WELLBUTRIN XL) 150 MG 24 hr tablet Take 450 mg by mouth every morning.    Yes [provider]  ELIQUIS 5 MG TABS tablet Take 1 tablet by mouth twice daily 04/18/23  Yes Allwardt, Alyssa M, PA-C  felodipine (PLENDIL) 5 MG 24 hr tablet Take 5 mg by mouth in the morning and at bedtime. 03/26/23  Yes [provider]  hydrOXYzine (ATARAX) 10 MG tablet TAKE 1 TABLET BY MOUTH EVERY 6 HOURS AS NEEDED FOR ANXIETY OR  ITCHING 08/20/21  Yes Allwardt, Alyssa M, PA-C  losartan (COZAAR) 100 MG tablet Take 100 mg by mouth daily.   Yes [provider]  metoprolol succinate  (TOPROL-XL) 100 MG 24 hr tablet Take 100 mg by mouth every morning. 04/08/22  Yes [provider]  Multiple Vitamin (MULTIVITAMIN WITH MINERALS) TABS tablet Take 1 tablet by mouth daily. 03/14/21  Yes Love, Evlyn Kanner, PA-C  spironolactone (ALDACTONE) 25 MG tablet Take 1 tablet (25 mg total) by mouth daily. 11/12/22  Yes Allwardt, Alyssa M, PA-C  vitamin B-12 1000 MCG tablet Take 1 tablet (1,000 mcg total) by mouth daily. 03/06/21  Yes Almon Hercules, MD  escitalopram (LEXAPRO) 10 MG tablet Take 10 mg by mouth daily. Patient not taking: Reported on 06/28/2023 04/07/23   [provider]      Allergies    Gabapentin and Amlodipine    Review of Systems   Review of Systems  Physical Exam Updated Vital Signs BP 107/66   Pulse (!) 55   Temp (!) 97.5 F (36.4 C) (Oral)   Resp 17   Ht 6' (1.829 m)   Wt 90.7 kg   SpO2 100%   BMI 27.12 kg/m  Physical Exam Vitals and nursing note reviewed.  Constitutional:      Appearance: He is well-developed.  HENT:     Head: Normocephalic and atraumatic.  Eyes:     Pupils: Pupils are equal, round, and reactive to light.  Neck:     Vascular: No JVD.  Cardiovascular:  Rate and Rhythm: Normal rate and regular rhythm.     Heart sounds: No murmur heard.    No friction rub. No gallop.  Pulmonary:     Effort: No respiratory distress.     Breath sounds: No wheezing.  Abdominal:     General: There is no distension.     Tenderness: There is no abdominal tenderness. There is no guarding or rebound.  Musculoskeletal:        General: Normal range of motion.     Cervical back: Normal range of motion and neck supple.  Skin:    Coloration: Skin is not pale.     Findings: No rash.  Neurological:     Mental Status: He is alert and oriented to person, place, and time.  Psychiatric:        Behavior: Behavior normal.     ED Results / Procedures / Treatments   Labs (all labs ordered are listed, but only abnormal results are  displayed) Labs Reviewed  BASIC METABOLIC PANEL - Abnormal; Notable for the following components:      Result Value   Chloride 112 (*)    CO2 20 (*)    Creatinine, Ser 2.02 (*)    Calcium 8.5 (*)    GFR, Estimated 33 (*)    All other components within normal limits  CBC - Abnormal; Notable for the following components:   RBC 3.96 (*)    Hemoglobin 11.7 (*)    HCT 35.8 (*)    All other components within normal limits  CBG MONITORING, ED - Abnormal; Notable for the following components:   Glucose-Capillary 102 (*)    All other components within normal limits  TROPONIN I (HIGH SENSITIVITY)  TROPONIN I (HIGH SENSITIVITY)    EKG EKG Interpretation Date/Time:  Tuesday June 28 2023 11:21:45 EDT Ventricular Rate:  51 PR Interval:  65 QRS Duration:  121 QT Interval:  537 QTC Calculation: 495 R Axis:   7  Text Interpretation: Sinus rhythm Short PR interval Consider right atrial enlargement Nonspecific intraventricular conduction delay Probable anteroseptal infarct, old No significant change since last tracing Confirmed by Melene Plan [phone]) on 06/28/2023 11:31:58 AM  Radiology DG Chest 2 View Result Date: 06/28/2023 CLINICAL DATA:  Chest pain. EXAM: CHEST - 2 VIEW COMPARISON:  Chest radiograph dated 04/02/2021. FINDINGS: The heart size and mediastinal contours are within normal limits. Both lungs are clear. The visualized skeletal structures are unremarkable. IMPRESSION: No active cardiopulmonary disease. Electronically Signed   By: Elgie Collard M.D.   On: 06/28/2023 13:48    Procedures Procedures    Medications Ordered in ED Medications - No data to display  ED Course/ Medical Decision Making/ A&P                                 Medical Decision Making Amount and/or Complexity of Data Reviewed Labs: ordered. Radiology: ordered.   78 yo M with a chief complaint of chest pain.  Been off and on for a few days but he said really got a lot worse about 40 minutes ago.   Has already resolved.  Lasted for seconds pinpoint.  Most likely musculoskeletal by history and physical.  With his symptoms getting much worse just prior to arrival will obtain 2 troponins.  Blood work chest x-ray reassess.  Initial trop negative.  No acute anemia.  No significant electrolyte abnormalities.   Awaiting second trop.  Signed out  to Dr. Silverio Lay, please see their note for further details of care in the ED>  The patients results and plan were reviewed and discussed.   Any x-rays performed were independently reviewed by myself.   Differential diagnosis were considered with the presenting HPI.  Medications - No data to display  Vitals:   06/28/23 1122 06/28/23 1123 06/28/23 1130 06/28/23 1510  BP: (!) 136/93  132/81 107/66  Pulse: (!) 51  (!) 50 (!) 55  Resp: 16  15 17   Temp: (!) 97.5 F (36.4 C)     TempSrc: Oral     SpO2: 100%  100% 100%  Weight:  90.7 kg    Height:  6' (1.829 m)      Final diagnoses:  Nonspecific chest pain    Admission/ observation were discussed with the admitting physician, patient and/or family and they are comfortable with the plan.          Final Clinical Impression(s) / ED Diagnoses Final diagnoses:  Nonspecific chest pain    Rx / DC Orders ED Discharge Orders     None         Melene Plan, DO 06/28/23 1524

## 2023-06-28 NOTE — Discharge Instructions (Signed)
 As we discussed, your heart enzyme test and workup in the ER is reassuring  I recommend you call Dr. Annitta Jersey office for follow-up  Return to ER if you have worse chest pain or shortness of breath

## 2023-06-28 NOTE — ED Triage Notes (Addendum)
 Pt BIB [facility] from home. Pt presents with an episode of CP that was sharp on the L side of his chest without radiation while walking. Pain was an instant shooting pain that immediately went away. He denies any ShOB, nausea, diaphoresis, or malaise. Denies any recent injury, however, he does report he noticed some pain with breathing 3 nights ago. Pt took 324 mg of ASA after it happened. EMS started a 22g IV in the L hand and did not administer any further treatment.   EMS Vitals   150/92 HR 52 SpO2 99% CBG 118 RR 18

## 2023-06-28 NOTE — ED Provider Notes (Signed)
  Physical Exam  BP 130/77 (BP Location: Right Arm)   Pulse (!) 57   Temp 97.7 F (36.5 C) (Oral)   Resp 15   Ht 6' (1.829 m)   Wt 90.7 kg   SpO2 99%   BMI 27.12 kg/m   Physical Exam  Procedures  Procedures  ED Course / MDM    Medical Decision Making Care assumed at 3 PM.  Patient had episode of left-sided chest pain.  Lasted several seconds.  Patient's initial troponin is 5.  Signout pending second troponin.  Previous provider felt patient is low risk for ACS  4:18 PM Repeat troponin is 6.  I reassessed patient is pain-free.  He follows with Dr. Sharyn Lull.  I recommend that he calls up his doctor for follow-up  Problems Addressed: Nonspecific chest pain: acute illness or injury  Amount and/or Complexity of Data Reviewed Labs: ordered. Decision-making details documented in ED Course. Radiology: ordered.         Charlynne Pander, MD 06/28/23 [phone]

## 2023-06-28 NOTE — ED Notes (Signed)
 Pt provided discharge instructions and prescription information. Pt was given the opportunity to ask questions and questions were answered.

## 2023-07-12 ENCOUNTER — Telehealth: Payer: Self-pay | Admitting: Neurology

## 2023-07-12 DIAGNOSIS — F411 Generalized anxiety disorder: Secondary | ICD-10-CM | POA: Diagnosis not present

## 2023-07-12 DIAGNOSIS — F330 Major depressive disorder, recurrent, mild: Secondary | ICD-10-CM | POA: Diagnosis not present

## 2023-07-12 NOTE — Telephone Encounter (Signed)
 Pt said went to see psychiatry and recommended I see my neurologist for hallucinations. Only have them when you wake up may be twice a week. Would like call back.  Informed patient to have psychiatrist send over a referral

## 2023-07-12 NOTE — Telephone Encounter (Signed)
 I spoke to the patient to get a little more information. 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 His hallucinations have gotten worse over the last 3-4 years. he has visual hallucinations. He said he sees his wife and generally it only happens when he is first waking up. He said his psychiatrist has advised him to see neurology and said that neurology "might want to do a sleep study or something". I checked his chart. He saw Dr Lucia Gaskins originally for the neuropathy in 2019, then Jessica NP in 2023 for a stroke, In March 2024 he saw Dr Frances Furbish for sleep consult. An NPSG was ordered but patient did not respond to the two VM that were left for him regarding scheduling. I told him that in order to address a new issue of hallucinations, he would need to ask his psychiatrist to send a referral to our office for evaluation. I also told him that in order to proceed with a sleep study he would need to have another OV. The patient is interested in having a sleep study. He feels he has no other choice because he wants to know what is wrong with him. I informed him that I would talk to the providers and get back to him.

## 2023-07-13 ENCOUNTER — Other Ambulatory Visit: Payer: Self-pay | Admitting: Physician Assistant

## 2023-07-13 NOTE — Telephone Encounter (Signed)
 Shanda Bumps and Dr. Lucia Gaskins had referred him for sleep consultation.  Since he has not been seen in sleep clinic in over 1 year, he would have to have a face-to-face visit, can be scheduled with Shanda Bumps or myself and then a sleep study can be ordered.

## 2023-07-13 NOTE — Telephone Encounter (Signed)
 As new issue concerning hallucinations, would recommend he be seen by MD to further evaluate and ensure nothing else is needed outside of sleep study. Thank you.

## 2023-07-14 NOTE — Telephone Encounter (Signed)
 Called pt & LVM (ok per DPR) advising that if his psychiatrist is wanting him to have a full neurological evaluation for the hallucinations, we would need him to have psychiatry send a referral to [place] and then we can get him scheduled with Dr Lucia Gaskins. If he is being told to just follow-up and get a sleep study that he was supposed to have gotten, then we need to schedule a visit with Dr Frances Furbish to re-order the sleep study. I asked for him to call us back.

## 2023-07-18 ENCOUNTER — Telehealth: Payer: Self-pay

## 2023-07-18 NOTE — Telephone Encounter (Signed)
 Received referral from Merian Capron MSN PM[facility]NPBC at [facility]. Referral is for sleep study - hallucinations upon wakening. There is also a note in the chart from where pt called in. Placed in sleep mailbox

## 2023-08-15 ENCOUNTER — Ambulatory Visit (INDEPENDENT_AMBULATORY_CARE_PROVIDER_SITE_OTHER): Admitting: Adult Health

## 2023-08-15 ENCOUNTER — Encounter: Payer: Self-pay | Admitting: Adult Health

## 2023-08-15 ENCOUNTER — Telehealth: Payer: Self-pay

## 2023-08-15 VITALS — BP 136/71 | HR 56 | Ht 72.0 in | Wt 203.0 lb

## 2023-08-15 DIAGNOSIS — R442 Other hallucinations: Secondary | ICD-10-CM | POA: Diagnosis not present

## 2023-08-15 DIAGNOSIS — Z8673 Personal history of transient ischemic attack (TIA), and cerebral infarction without residual deficits: Secondary | ICD-10-CM

## 2023-08-15 DIAGNOSIS — Z9189 Other specified personal risk factors, not elsewhere classified: Secondary | ICD-10-CM

## 2023-08-15 NOTE — Telephone Encounter (Signed)
 Attempted to call Pt, no answer LVM for call back.

## 2023-08-15 NOTE — Telephone Encounter (Signed)
-----   Message from Johny Nap sent at 08/15/2023  2:46 PM EDT ----- Please advise patient/wife that Dr. Omar Bibber recommends proceeding with an in lab study as this would be more comprehensive based on his complaints. Order placed, will be called to schedule. Thank you. ----- Message ----- From: Debbra Fairy, MD Sent: 08/15/2023   1:34 PM EDT To: Johny Nap, NP  Lab study would be more comprehensive and recommended.  sa ----- Message ----- From: Johny Nap, NP Sent: 08/15/2023   1:23 PM EDT To: Debbra Fairy, MD  Can this patient have at home sleep study or would you prefer in lab study (based on insurance approval/coverage). He is concerned that he will have difficulty falling asleep if he needs to do in lab study but is willing to try if in lab study is recommended.

## 2023-08-15 NOTE — Progress Notes (Addendum)
 [facility] [address] [place]. [place] [address] [phone]       OFFICE FOLLOW UP NOTE  [name]. Date of Birth:  [birth year] Medical Record Number:  [id number]   Reason for visit: Sleep concerns    SUBJECTIVE:   CHIEF COMPLAINT:  Chief Complaint  Patient presents with   Cerebrovascular Accident    Rm 3 with spouse Pt is well, reports he gets about 5 hrs of sleep a night. He also mentions have hallucinations about once a month.  He will see things or his wife that's not actually in the room.  No stroke concerns.     HPI:   Update 08/15/2023 JM: Returns today for follow-up visit due to complaints of hallucinations upon awakening, was referred back by his psychiatrist.  He reports about once per month, he will see his wife upon awakening, he will ask her a question and when she doesn't respond, he knows he is hallucinating and then quickly resolves.  Hallucinations do not occur during the day or prior to falling asleep.  He does continue to have issues with insomnia, typically sleeps about 4 to 5 hours per night.  Can occasionally have difficulty falling asleep.  Reports occasional use of alcohol, unable to correlate use of alcohol with visual hallucinations.  He was previously seen by Dr. Omar Bibber 06/2022 and recommended proceeding with sleep study as he does have some risk for sleep apnea, order placed for sleep study but was never pursued.  Denies snoring, witnessed apneas, morning headaches or nocturia.  History of prior stroke, no new stroke/TIA symptoms.  Continued left-sided vision impairment stable.  Compliant on stroke prevention medications.  Reports routine follow-up with PCP and cardiology.     History provided for reference purposes only  Update 06/10/2022 JM: Patient returns for new concerns.   Concerns primarily regarding his sleep.  He does have chronic issues with insomnia that has been gradually worsening over the past 6 months and  over the past few months, has been having increased issues with night terrors, acting out dreams and talking during sleep, waking up initially feeling confused (not knowing where he is at first) but quickly resolves, and at times feels like he wakes up in the middle the night and will see people or objects that are not really there.  He does wake up feeling groggy and is always feeling fatigued.  Denies snoring, witnessed apneas, nocturia or headaches.  Denies previously undergoing sleep study.  No other focal neurological deficits or associated symptoms.  Denies any cognitive difficulties or memory loss concerns.  He also mentions ongoing neuropathy which has been present since 2009 post chemo.  Symptoms worse at night and questions if this is also interfering with sleep.  He was previously seen by Dr. Tresia Fruit in 2019, mentioned has tried gabapentin  in the past but had weird dreams therefore recommended trial of Lyrica  and to f/u with PCP for further treatment/management. Denies any worsening of these symptoms, just persistent and bothersome. Not currently on medication management.    Of note, he was previously seen in office over 1 year ago which was for hospital follow-up post right PCA stroke visual left hemiparesis and left hemianopia. Per epic review, he was diagnosed with possible cerebellar infarct likely secondary to embolic source in setting of AF on Eliquis  in 07/2021 after presenting with left-sided ataxia.  CT head no acute abnormality, encephalomalacia in right MCA and PCA territories since 02/2021.  Repeat CTH no acute abnormality.  CTA head/neck moderate irregularity of right PCA P2.  Unable to obtain MRI due to metal fragment in orbit.  EF 60 to 65%.  EF 60 to 65%.  LDL 58.  A1c 5.4.  Recommended ongoing use of Eliquis  and atorvastatin .  Left-sided ataxia resolved prior to discharge.  He has been stable since that time without any new stroke/TIA symptoms.  Does have residual mild left visual  loss, denies left sided weakness.  Remains on Eliquis  and atorvastatin .  Blood pressure well-controlled at home, elevated today but typically increases at appointments and recently started taking a cold medicine.  Routinely follows with PCP.  Initial visit 04/30/2021 JM: [name] is being seen for initial hospital follow-up accompanied by his wife.  He was discharged home from CIR on 03/14/2021. Reports residual left peripheral visual impairment, decreased left hand dexterity and c/o left thigh pain/cramping at night but denies residual weakness. Would like to start outpatient therapies. Plans to f/u with ophthalmology.  Denies new stroke/TIA symptoms.  Compliant on Eliquis  and atorvastatin  without side effects.  Blood pressure today 130/84.  No further concerns at this time.  Stroke admission 02/27/2021: [name]. is a 78 y.o. male with history of 78 y.o. male with history h/o colon cancer s/p resection, prostate cancer, anxiety/depression, osteoarthritis of bilateral knees who presented on 02/27/2021 with complaints of persistent headaches and left-sided weakness over past 4 days since receiving flu vaccine on 11/14.  Personally reviewed hospitalization pertinent progress notes, lab work and imaging.  Evaluated by Dr. Janett Medin for subacute right PCA infarct, embolic secondary to new onset A. fib.  CTA head/neck showed possible focal occlusion of P2 branch otherwise unremarkable.  Evidence of old right parietal lobe stroke on imaging.  Unable to complete MRI due to metal in eye.  EF 60 to 65%.  EKG showed evidence of A. Fib -placed on Eliquis  5 mg twice daily.  LDL 58.  A1c 5.4.  Advised continuation of atorvastatin  at discharge. Per exam, dense left homonymous hemianopia and mild LLE weakness.  Therapy eval's recommended CIR for functional decline.     PERTINENT IMAGING  Per hospitalization 02/27/2021 CTH and CTA head & neck  1. Subacute right PCA territorial infarct with probable small foci of  petechial hemorrhage. 2. Possible focal occlusion of a P2 branch at the level of the ambient cistern, though the PCA is identified within the hypodense right occipital lobe. 3. Otherwise, patent vasculature of the head and neck with no significant atherosclerotic disease. 4. Small remote infarct in the right parietal lobe MRI  Unable to obtain due to metal in eye 2D Echo 60 to 65% EKG with A Fib LDL 58 HgbA1c 5.4       ROS:   14 system review of systems performed and negative with exception of those listed in HPI  PMH:  Past Medical History:  Diagnosis Date   Anxiety    Arthritis    OSTEO IN KNEE   Asthma    as child   Colon cancer (HCC) 2009   chemotherapy   Depression    Elevated prostate specific antigen (PSA)    Generalized anxiety disorder 04/09/2015   Hematospermia    Hx antineoplastic chemotherapy 2010   Hypertension    Idiopathic peripheral neuropathy    TOES OF BOTH FEET DUE TO CHEMO   Incomplete bladder emptying    Malignant neoplasm of prostate (HCC) 2010   radiation   Nodular prostate with lower urinary tract symptoms    Panic  attacks 04/09/2015   Primary localized osteoarthrosis of the knee, right 05/19/2016   Prostatitis    S/P total knee arthroplasty, left 05/19/2016   Spermatocele    Weak urinary stream     PSH:  Past Surgical History:  Procedure Laterality Date   COLON RESECTION   NOV 2009   COLONOSCOPY     PROSTATE BIOPSY     SPERMATOCELECTOMY Left 05/07/2014   Procedure: LEFT SPERMATOCELECTOMY;  Surgeon: Willye Harvey, MD;  Location: [facility];  Service: Urology;  Laterality: Left;   TONSILLECTOMY     TOTAL KNEE ARTHROPLASTY Left 04/21/2015   Procedure: LEFT TOTAL KNEE ARTHROPLASTY;  Surgeon: Elly Habermann, MD;  Location: [facility] OR;  Service: Orthopedics;  Laterality: Left;   TOTAL KNEE ARTHROPLASTY Right 05/31/2016   Procedure: TOTAL KNEE ARTHROPLASTY;  Surgeon: Elly Habermann, MD;  Location: [facility] OR;  Service: Orthopedics;  Laterality: Right;    TRANSURETHRAL RESECTION OF PROSTATE N/A 05/07/2014   Procedure: TRANSURETHRAL RESECTION OF THE PROSTATE WITH GYRUS INSTRUMENTS;  Surgeon: Willye Harvey, MD;  Location: [facility];  Service: Urology;  Laterality: N/A;    Social History:  Social History   Socioeconomic History   Marital status: Married    Spouse name: Not on file   Number of children: 9   Years of education: Not on file   Highest education level: Not on file  Occupational History   Occupation: retire  Tobacco Use   Smoking status: Former    Current packs/day: 0.00    Average packs/day: 0.3 packs/day for 3.0 years (0.8 ttl pk-yrs)    Types: Cigarettes    Start date: 04/12/1969    Quit date: 04/12/1972    Years since quitting: 51.3   Smokeless tobacco: Never   Tobacco comments:    smoked 1 cigarette per day SOME DAYS  Vaping Use   Vaping status: Never Used  Substance and Sexual Activity   Alcohol use: Yes    Alcohol/week: 1.0 standard drink of alcohol    Types: 1 Cans of beer per week    Comment: OCCASIONAL   Drug use: No   Sexual activity: Yes  Other Topics Concern   Not on file  Social History Narrative   [name] Vincent will take care of him after his surgery    Her cell is [phone]      Update 06/22/2022   Caffeine: maybe 2 sodas/day    Lives at home with wife   Right handed   Social Drivers of Health   Financial Resource Strain: Low Risk  (09/16/2022)   Overall Financial Resource Strain (CARDIA)    Difficulty of Paying Living Expenses: Not hard at all  Food Insecurity: No Food Insecurity (09/16/2022)   Hunger Vital Sign    Worried About Running Out of Food in the Last Year: Never true    Ran Out of Food in the Last Year: Never true  Transportation Needs: No Transportation Needs (09/16/2022)   PRAPARE - Administrator, Civil Service (Medical): No    Lack of Transportation (Non-Medical): No  Physical Activity: Insufficiently Active (09/16/2022)   Exercise Vital Sign    Days of Exercise per Week:  3 days    Minutes of Exercise per Session: 10 min  Stress: Stress Concern Present (09/16/2022)   [employer] of Occupational Health - Occupational Stress Questionnaire    Feeling of Stress : To some extent  Social Connections: Moderately Isolated (09/16/2022)   Social Connection and Isolation Panel [NHANES]  Frequency of Communication with Friends and Family: Once a week    Frequency of Social Gatherings with Friends and Family: More than three times a week    Attends Religious Services: Never    Database administrator or Organizations: No    Attends Banker Meetings: Never    Marital Status: Married  Catering manager Violence: Not At Risk (09/16/2022)   Humiliation, Afraid, Rape, and Kick questionnaire    Fear of Current or Ex-Partner: No    Emotionally Abused: No    Physically Abused: No    Sexually Abused: No    Family History:  Family History  Problem Relation Age of Onset   Stroke Father        age 35   Cancer Brother        neoplasm of brain   Restless legs syndrome Neg Hx    Sleep apnea Neg Hx     Medications:   Current Outpatient Medications on File Prior to Visit  Medication Sig Dispense Refill   acetaminophen  (TYLENOL ) 500 MG tablet Take 500 mg by mouth every 6 (six) hours as needed for mild pain or headache.     amiodarone (PACERONE) 200 MG tablet Take 100 mg by mouth daily.     atorvastatin  (LIPITOR ) 20 MG tablet Take 20 mg by mouth daily.     buPROPion  (WELLBUTRIN  XL) 150 MG 24 hr tablet Take 450 mg by mouth every morning.      ELIQUIS  5 MG TABS tablet Take 1 tablet by mouth twice daily 180 tablet 0   felodipine  (PLENDIL ) 5 MG 24 hr tablet Take 5 mg by mouth in the morning and at bedtime.     hydrOXYzine  (ATARAX ) 10 MG tablet TAKE 1 TABLET BY MOUTH EVERY 6 HOURS AS NEEDED FOR ANXIETY OR  ITCHING 120 tablet 0   losartan  (COZAAR ) 100 MG tablet Take 100 mg by mouth daily.     metoprolol  succinate (TOPROL -XL) 100 MG 24 hr tablet Take 100 mg by mouth  every morning.     Multiple Vitamin (MULTIVITAMIN WITH MINERALS) TABS tablet Take 1 tablet by mouth daily.     spironolactone  (ALDACTONE ) 25 MG tablet Take 1 tablet (25 mg total) by mouth daily. 90 tablet 3   vitamin B-12 1000 MCG tablet Take 1 tablet (1,000 mcg total) by mouth daily.     escitalopram  (LEXAPRO ) 10 MG tablet Take 10 mg by mouth daily. (Patient not taking: Reported on 06/28/2023)     No current facility-administered medications on file prior to visit.    Allergies:   Allergies  Allergen Reactions   Gabapentin  Other (See Comments)    hallucinations   Amlodipine Other (See Comments)    Dizziness       OBJECTIVE:  Physical Exam  Vitals:   08/15/23 1245  BP: 136/71  Pulse: (!) 56  Weight: 203 lb (92.1 kg)  Height: 6' (1.829 m)   Body mass index is 27.53 kg/m. No results found.  General: well developed, well nourished, very pleasant elderly African-American male, seated, in no evident distress  Neurologic Exam Mental Status: Awake and fully alert.  Fluent speech and language.  Oriented to place and time. Recent and remote memory intact. Attention span, concentration and fund of knowledge appropriate. Mood and affect appropriate.  Cranial Nerves: Pupils equal, briskly reactive to light. Extraocular movements full without nystagmus. Visual fields partial left homonymous upper quadrantanopia. Hearing intact. Facial sensation intact. Face, tongue, palate moves normally and symmetrically.  Motor:  Normal bulk and tone. Normal strength in all tested extremity muscles.  Coordination: Rapid alternating movements normal in all extremities. Finger-to-nose and heel-to-shin performed accurately bilaterally. Gait and Station: Arises from chair without difficulty. Stance is normal. Gait demonstrates normal stride length although slightly decreased LLE step height without use of AD (chronic post stroke).  No evidence of shuffling. Good arm swing bilaterally.  Reflexes: 1+ and  symmetric. Toes downgoing.         ASSESSMENT: Vasilios Batz. is a 78 y.o. year old male with subacute right PCA infarct, embolic secondary to new onset A. fib on 02/27/2021 and possible right cerebellar infarct not seen on imaging (unable to obtain MRI) likely from A fib in 07/2021. Vascular risk factors include A. fib on Eliquis , HTN, HLD, prior strokes on imaging and advanced age.  Returns today, 08/15/2023, with concerns of visual hallucinations upon awakening.      PLAN:  Hypnopompic hallucinations:  Previously evaluated by Dr. Omar Bibber who recommended proceeding with sleep study but was never pursued.  It is again recommended proceeding with sleep study, he is concerned he would have difficulty falling asleep for an in lab study but agrees to proceed with at-home study - will f/u with Dr. Omar Bibber to ensure at home study appropriate, he was vies this will be based on insurance approval. Denies substance abuse, very occasional use of EtOH that he does not associate with visual hallucinations Psychiatry does not feel mental health conditions or medications contributing as hallucinations only occur upon awakening ADDENDUM: Dr. Omar Bibber recommends in lab study as this would be more comprehensive - order placed as advised.   Hx of prior strokes Residual deficit: Left homonymous upper quadrantanopsia.  Continue Eliquis  (apixaban ) daily  and atorvastatin  20 mg daily for secondary stroke prevention manner/prescribed by PCP/cardiology.   Continue to follow with cardiology for atrial fibrillation and Eliquis  management Discussed secondary stroke prevention measures and importance of close PCP follow up for aggressive stroke risk factor management including BP goal<130/90, and HLD with LDL goal<70     Request follow-up with Dr. Omar Bibber after completion of sleep study. If no sleep apnea, may need further evaluation for visual hallucinations    CC:  PCP: Allwardt, Alyssa M, PA-C    I spent 30  minutes of face-to-face and non-face-to-face time with patient.  This included previsit chart review including review of recent hospitalization, lab review, study review, order entry, electronic health record documentation, patient education and discussion regarding above diagnoses and treatment plan and answered all other questions to patient and wife satisfaction  Johny Nap, AGNP-BC  [facility] [address] [place], [place] [address]  Phone [phone] Fax [phone] Note: This document was prepared with digital dictation and possible smart phrase technology. Any transcriptional errors that result from this process are unintentional.

## 2023-08-15 NOTE — Patient Instructions (Addendum)
 Your Plan:   Recommend completing a sleep study - will see if Dr. Omar Bibber is okay with doing an in home study first, if so will request an at home study first but will be based on insurance approval         Follow up will be determined after completion of sleep study      Thank you for coming to see us  at [facility]. I hope we have been able to provide you high quality care today.  You may receive a patient satisfaction survey over the next few weeks. We would appreciate your feedback and comments so that we may continue to improve ourselves and the health of our patients.  Sleep Study, Adult A sleep study (polysomnogram) is a series of tests done while you are sleeping. It is used to see how well you sleep, help diagnose a sleep disorder, or create a plan to treat your sleep disorder. Sleep studies are done at sleep centers, which may be inside a hospital, office, or clinic. Your health care provider may recommend a sleep study if you: Have brief periods in which you stop breathing during sleep (sleepapnea). Fall asleep suddenly during the day (narcolepsy). Have trouble falling asleep or staying asleep (insomnia). Feel like you need to move your legs when trying to fall asleep (restless legs syndrome). Move your legs by flexing and extending them regularly while asleep (periodic limb movement disorder). Act out your dreams while you sleep (sleep behavior disorder). Feel like you cannot move when you first wake up (sleep paralysis). What tests are part of a sleep study? Most sleep studies record the following during sleep: Brain activity. Eye and limb movements. Heart rate and rhythm and blood pressure. Breathing rate and rhythm and blood oxygen level. Chest and belly movement as you breathe. Snoring or other noises. Body position. Tell a health care provider about: Any allergies you have, especially to products with sticky surfaces (adhesives). All  medicines you are taking, including vitamins, herbs, eye drops, creams, and over-the-counter medicines. Any medical conditions you have. What happens before the test? Your health care provider will let you know if you should stop taking any of your regular medicines before the test. Bring your pajamas and toothbrush with you to the sleep study. Do not have caffeine on the day of your sleep study. Do not drink alcohol on the day of your sleep study. What happens during the test?     Most sleep studies are done during a normal period of time for a full night of sleep. You will arrive at the study center in the evening and go home in the morning. The room where you have the study may look like a hospital room or a hotel room. For the test: Round, sticky patches with sensors attached to recording wires (electrodes) will be placed on your scalp, face, chest, and limbs. Wires from all the electrodes and sensors will run from your bed to a computer. The wires can be taken off and put back on if you need to get out of bed to go to the bathroom. A sensor will be placed over your nose to measure airflow. A finger clip will be put on your finger or ear to measure your blood oxygen level (pulse oximetry). Belts will be placed around your belly and chest to measure breathing movements. Monitoring will begin. The health care providers doing the study may come in and out of the room during the study. Most of  the time, they will be in another room monitoring your test as you sleep. If you have signs of sleep apnea during your test, you may get a treatment mask to wear for the second half of the night. The mask provides positive airway pressure (PAP) to help you breathe better during sleep. This may greatly improve your sleep apnea. You will then have all tests done again with the mask in place to see if your measurements and recordings change. What can I expect after the test? A health care provider who  specializes in sleep will evaluate the results of your sleep study and share them with you and your primary health care provider. Based on your results, your medical history, and a physical exam, you may be diagnosed with a sleep disorder. Your health care team will help determine your treatment options based on your diagnosis. This may include: Improving your sleep habits (sleep hygiene). Wearing a continuous positive airway pressure (CPAP) or bi-level positive airway pressure (BIPAP) mask. Wearing an oral device at night to improve breathing and reduce snoring. Taking medicines. Follow these instructions at home: Take over-the-counter and prescription medicines only as told by your health care provider. Make any lifestyle changes that your health care provider recommends. If you are instructed to use a CPAP or BIPAP mask, make sure you use it nightly as directed. If you were given a device to open your airway while you sleep, use it only as told by your health care provider. Do not use any products that contain nicotine or tobacco. These products include cigarettes, chewing tobacco, and vaping devices, such as e-cigarettes. If you need help quitting, ask your health care provider. Summary A sleep study is a series of tests done while you are sleeping. It shows how well you sleep. Most sleep studies are done over one full night of sleep. You will arrive at the study center in the evening and go home in the morning. If you have signs of the sleep disorder called sleep apnea during your test, you may get a treatment mask to wear for the second half of the night. A health care provider who specializes in sleep will evaluate the results of your sleep study and share them with your primary health care provider. This information is not intended to replace advice given to you by your health care provider. Make sure you discuss any questions you have with your health care provider. Document Revised:  07/29/2021 Document Reviewed: 07/29/2021 Elsevier Patient Education  2024 [employer].

## 2023-08-15 NOTE — Addendum Note (Signed)
 Addended by: Johny Nap L on: 08/15/2023 02:46 PM   Modules accepted: Orders

## 2023-08-16 NOTE — Telephone Encounter (Addendum)
 Spoke to patient made him aware per [facility]  Dr Omar Bibber recommends proceeding with an in lab study as this would be more comprehensive based on his complaints. Order placed,  sleep  lab will be called to schedule after insurance approval Pt expressed understanding and thanked me for calling

## 2023-08-19 ENCOUNTER — Telehealth: Payer: Self-pay | Admitting: Adult Health

## 2023-08-19 NOTE — Telephone Encounter (Signed)
 [employer] no auth req.  Sent mychart

## 2023-08-24 ENCOUNTER — Ambulatory Visit (INDEPENDENT_AMBULATORY_CARE_PROVIDER_SITE_OTHER): Admitting: Physician Assistant

## 2023-08-24 ENCOUNTER — Encounter: Payer: Self-pay | Admitting: Physician Assistant

## 2023-08-24 ENCOUNTER — Telehealth: Payer: Self-pay

## 2023-08-24 VITALS — BP 110/70 | HR 60 | Ht 69.75 in | Wt 198.4 lb

## 2023-08-24 DIAGNOSIS — Z09 Encounter for follow-up examination after completed treatment for conditions other than malignant neoplasm: Secondary | ICD-10-CM

## 2023-08-24 DIAGNOSIS — Z85038 Personal history of other malignant neoplasm of large intestine: Secondary | ICD-10-CM

## 2023-08-24 DIAGNOSIS — Z87891 Personal history of nicotine dependence: Secondary | ICD-10-CM | POA: Diagnosis not present

## 2023-08-24 NOTE — Telephone Encounter (Signed)
  George [name]. 11-17-1945 [id number]  08/24/23   Dear Dr. Chapman Commodore, MD :  We would like to schedule the above named patient for a(n) Colonoscopy and Endoscopy procedure. Our records show that (s)he is on anticoagulation therapy.  Please advise as to whether the patient may come off their therapy of Eliquis  2 days prior to their procedure which is to be determined.  Please route your response to Lianne Redo, CMA or fax response to [phone].  Sincerely,    [facility]

## 2023-08-24 NOTE — Progress Notes (Unsigned)
 Brigitte Canard, PA-C [address] [place], [place]  [address] Phone: [phone]   Gastroenterology Consultation  Referring Provider:     Allwardt, Deleta Felix, PA-C Primary Care Physician:  Allwardt, Deleta Felix, PA-C Primary Gastroenterologist:  Brigitte Canard, PA-C / Darol Elizabeth, MD  Reason for Consultation:     Discuss Repeat Colonoscopy; Hx Colon Cancer        HPI:   George [name]. is a 78 y.o. y/o male referred for consultation & management  by Allwardt, Deleta Felix, PA-C.    Hx Colon Cancer in 2009 diagnosed by Dr. Delilah Fend with [facility].  He underwent resection and chemotherapy treatment.  Patient states he has had 1 other repeat colonoscopy done 7 or 8 years ago in [place] at [facility].  Results are not available.  We are requesting records for review.  GI symptoms: Patient denies any GI symptoms.  Specifically he is not having any abdominal pain, constipation, rectal bleeding, or weight loss.  PMH: Hypertension, hyperlipidemia, A-fib on Eliquis , prior history of right CVA November 2022 with some residual visual deficits and mild left-sided weakness, history of colon cancer status postresection and chemo 2009, prostate cancer, depression/anxiety, CKD stage IIIb.  Past Medical History:  Diagnosis Date   Anxiety    Arthritis    OSTEO IN KNEE   Asthma    as child   Colon cancer (HCC) 2009   chemotherapy   Depression    Elevated prostate specific antigen (PSA)    Generalized anxiety disorder 04/09/2015   Hematospermia    Hx antineoplastic chemotherapy 2010   Hypertension    Idiopathic peripheral neuropathy    TOES OF BOTH FEET DUE TO CHEMO   Incomplete bladder emptying    Malignant neoplasm of prostate (HCC) 2010   radiation   Nodular prostate with lower urinary tract symptoms    Panic attacks 04/09/2015   Primary localized osteoarthrosis of the knee, right 05/19/2016   Prostatitis    S/P total knee arthroplasty, left 05/19/2016   Spermatocele    Weak  urinary stream     Past Surgical History:  Procedure Laterality Date   COLON RESECTION   NOV 2009   COLONOSCOPY     PROSTATE BIOPSY     SPERMATOCELECTOMY Left 05/07/2014   Procedure: LEFT SPERMATOCELECTOMY;  Surgeon: Willye Harvey, MD;  Location: [facility];  Service: Urology;  Laterality: Left;   TONSILLECTOMY     TOTAL KNEE ARTHROPLASTY Left 04/21/2015   Procedure: LEFT TOTAL KNEE ARTHROPLASTY;  Surgeon: Elly Habermann, MD;  Location: [facility] OR;  Service: Orthopedics;  Laterality: Left;   TOTAL KNEE ARTHROPLASTY Right 05/31/2016   Procedure: TOTAL KNEE ARTHROPLASTY;  Surgeon: Elly Habermann, MD;  Location: [facility];  Service: Orthopedics;  Laterality: Right;   TRANSURETHRAL RESECTION OF PROSTATE N/A 05/07/2014   Procedure: TRANSURETHRAL RESECTION OF THE PROSTATE WITH GYRUS INSTRUMENTS;  Surgeon: Willye Harvey, MD;  Location: [facility];  Service: Urology;  Laterality: N/A;    Prior to Admission medications   Medication Sig Start Date End Date Taking? Authorizing Provider  acetaminophen  (TYLENOL ) 500 MG tablet Take 500 mg by mouth every 6 (six) hours as needed for mild pain or headache.    [provider]  amiodarone (PACERONE) 200 MG tablet Take 100 mg by mouth daily. 05/20/22   [provider]  atorvastatin  (LIPITOR ) 20 MG tablet Take 20 mg by mouth daily. 06/01/21   [provider]  buPROPion  (WELLBUTRIN  XL) 150  MG 24 hr tablet Take 450 mg by mouth every morning.     [provider]  ELIQUIS  5 MG TABS tablet Take 1 tablet by mouth twice daily 07/13/23   Allwardt, Alyssa M, PA-C  escitalopram  (LEXAPRO ) 10 MG tablet Take 10 mg by mouth daily. Patient not taking: Reported on 06/28/2023 04/07/23   [provider]  felodipine  (PLENDIL ) 5 MG 24 hr tablet Take 5 mg by mouth in the morning and at bedtime. 03/26/23   [provider]  hydrOXYzine  (ATARAX ) 10 MG tablet TAKE 1 TABLET BY MOUTH EVERY 6 HOURS AS NEEDED FOR ANXIETY OR  ITCHING 08/20/21   Allwardt, Alyssa M,  PA-C  losartan  (COZAAR ) 100 MG tablet Take 100 mg by mouth daily.    [provider]  metoprolol  succinate (TOPROL -XL) 100 MG 24 hr tablet Take 100 mg by mouth every morning. 04/08/22   [provider]  Multiple Vitamin (MULTIVITAMIN WITH MINERALS) TABS tablet Take 1 tablet by mouth daily. 03/14/21   Love, Renay Carota, PA-C  spironolactone  (ALDACTONE ) 25 MG tablet Take 1 tablet (25 mg total) by mouth daily. 11/12/22   Allwardt, Deleta Felix, PA-C  vitamin B-12 1000 MCG tablet Take 1 tablet (1,000 mcg total) by mouth daily. 03/06/21   Gonfa, Taye T, MD    Family History  Problem Relation Age of Onset   Hypertension Mother    Stroke Father        age 68   Hypertension Father    Cancer Brother        neoplasm of brain   Restless legs syndrome Neg Hx    Sleep apnea Neg Hx      Social History   Tobacco Use   Smoking status: Former    Current packs/day: 0.00    Average packs/day: 0.3 packs/day for 3.0 years (0.8 ttl pk-yrs)    Types: Cigarettes    Start date: 04/12/1969    Quit date: 04/12/1972    Years since quitting: 51.4   Smokeless tobacco: Never   Tobacco comments:    smoked 1 cigarette per day SOME DAYS  Vaping Use   Vaping status: Never Used  Substance Use Topics   Alcohol use: Not Currently    Alcohol/week: 1.0 standard drink of alcohol    Types: 1 Cans of beer per week   Drug use: No    Allergies as of 08/24/2023 - Review Complete 08/24/2023  Allergen Reaction Noted   Gabapentin  Other (See Comments) 01/31/2018   Amlodipine Other (See Comments) 04/22/2014    Review of Systems:    All systems reviewed and negative except where noted in HPI.   Physical Exam:  BP 110/70 (BP Location: Left Arm, Patient Position: Sitting, Cuff Size: Normal)   Pulse 60   Ht 5' 9.75" (1.772 m) Comment: height measured without shoes  Wt 198 lb 6 oz (90 kg)   BMI 28.67 kg/m  No LMP for male patient.  General:   Alert,  Well-developed, well-nourished, pleasant and cooperative  in NAD Lungs:  Respirations even and unlabored.  Clear throughout to auscultation.   No wheezes, crackles, or rhonchi. No acute distress. Heart:  Regular rate and rhythm; no murmurs, clicks, rubs, or gallops. Abdomen:  Normal bowel sounds.  No bruits.  Soft, and non-distended without masses, hepatosplenomegaly or hernias noted.  No Tenderness.  No guarding or rebound tenderness.    Neurologic:  Alert and oriented x3;  grossly normal neurologically. Psych:  Alert and cooperative. Normal mood and affect.  Imaging Studies: No results found.  Labs: CBC    Component Value Date/Time   WBC 10.0 06/28/2023 1133   RBC 3.96 (L) 06/28/2023 1133   HGB 11.7 (L) 06/28/2023 1133   HGB 12.2 (L) 09/10/2009 1017   HCT 35.8 (L) 06/28/2023 1133   HCT 35.9 (L) 09/10/2009 1017   PLT 285 06/28/2023 1133   PLT 298 09/10/2009 1017   MCV 90.4 06/28/2023 1133   MCV 87.8 09/10/2009 1017   MCH 29.5 06/28/2023 1133   MCHC 32.7 06/28/2023 1133   RDW 14.2 06/28/2023 1133   RDW 15.7 (H) 09/10/2009 1017   LYMPHSABS 1.9 05/16/2023 0943   LYMPHSABS 1.7 09/10/2009 1017   MONOABS 0.4 05/16/2023 0943   MONOABS 0.4 09/10/2009 1017   EOSABS 0.3 05/16/2023 0943   EOSABS 0.2 09/10/2009 1017   BASOSABS 0.0 05/16/2023 0943   BASOSABS 0.0 09/10/2009 1017    CMP     Component Value Date/Time   NA 138 06/28/2023 1133   NA 143 01/14/2022 0000   K 4.2 06/28/2023 1133   CL 112 (H) 06/28/2023 1133   CO2 20 (L) 06/28/2023 1133   GLUCOSE 90 06/28/2023 1133   BUN 20 06/28/2023 1133   BUN 18 01/14/2022 0000   CREATININE 2.02 (H) 06/28/2023 1133   CALCIUM  8.5 (L) 06/28/2023 1133   PROT 7.6 05/16/2023 0943   ALBUMIN 4.0 05/16/2023 0943   AST 28 05/16/2023 0943   ALT 18 05/16/2023 0943   ALKPHOS 81 05/16/2023 0943   BILITOT 0.7 05/16/2023 0943   GFRNONAA 33 (L) 06/28/2023 1133   GFRAA 46 (L) 10/21/2017 0432    Assessment and Plan:   Katelyn Braxton. is a 78 y.o. y/o male has been referred for:   History of  Colon Cander Dx 2009 s/p colon resection and Chemo. -Request previous Colonoscopy reports from [facility] for review.  Then we can decide about scheduling repeat colonoscopy. - We discussed risks and benefits of colonoscopy at length to include risk of bleeding, colon perforation, or risk from sedation.  Risk increases after age 52.  Comorbidities: Hypertension, hyperlipidemia, A-fib on Eliquis , prior history of right CVA November 2022 with some residual visual deficits and mild left-sided weakness, history of colon cancer status postresection and chemo 2009, prostate cancer, depression/anxiety, CKD stage IIIb. - If we do schedule repeat colonoscopy, then we will need to request permission to hold Eliquis  5 days before Colonoscopy.  Follow up based on last colonoscopy results from [facility], once we review have received and reviewed records.  Brigitte Canard, PA-C

## 2023-08-24 NOTE — Patient Instructions (Signed)
 Please follow up sooner if symptoms increase or worsen  Due to recent changes in healthcare laws, you may see the results of your imaging and laboratory studies on MyChart before your provider has had a chance to review them.  We understand that in some cases there may be results that are confusing or concerning to you. Not all laboratory results come back in the same time frame and the provider may be waiting for multiple results in order to interpret others.  Please give us  48 hours in order for your provider to thoroughly review all the results before contacting the office for clarification of your results.   _______________________________________________________  If your blood pressure at your visit was 140/90 or greater, please contact your primary care physician to follow up on this.  _______________________________________________________  If you are age 80 or older, your body mass index should be between 23-30. Your Body mass index is 28.67 kg/m. If this is out of the aforementioned range listed, please consider follow up with your Primary Care Provider.  If you are age 84 or younger, your body mass index should be between 19-25. Your Body mass index is 28.67 kg/m. If this is out of the aformentioned range listed, please consider follow up with your Primary Care Provider.   ________________________________________________________  The  GI providers would like to encourage you to use MYCHART to communicate with providers for non-urgent requests or questions.  Due to long hold times on the telephone, sending your provider a message by [facility] may be a faster and more efficient way to get a response.  Please allow 48 business hours for a response.  Please remember that this is for non-urgent requests.  _______________________________________________________ Thank you for trusting me with your gastrointestinal care!   Brigitte Canard, PA

## 2023-08-25 ENCOUNTER — Encounter: Payer: Self-pay | Admitting: Physician Assistant

## 2023-08-29 DIAGNOSIS — H40023 Open angle with borderline findings, high risk, bilateral: Secondary | ICD-10-CM | POA: Diagnosis not present

## 2023-08-29 DIAGNOSIS — H5203 Hypermetropia, bilateral: Secondary | ICD-10-CM | POA: Diagnosis not present

## 2023-08-29 DIAGNOSIS — H25813 Combined forms of age-related cataract, bilateral: Secondary | ICD-10-CM | POA: Diagnosis not present

## 2023-08-29 DIAGNOSIS — H52223 Regular astigmatism, bilateral: Secondary | ICD-10-CM | POA: Diagnosis not present

## 2023-08-29 NOTE — Telephone Encounter (Signed)
 [employer] Medicare no auth req   Patient is scheduled at [facility] for 10/03/2023 at 9 pm.  Mailed packet and sent mychart

## 2023-09-02 ENCOUNTER — Telehealth: Payer: Self-pay

## 2023-09-02 NOTE — Telephone Encounter (Signed)
 N/a

## 2023-09-07 DIAGNOSIS — N2581 Secondary hyperparathyroidism of renal origin: Secondary | ICD-10-CM | POA: Diagnosis not present

## 2023-09-07 DIAGNOSIS — N1832 Chronic kidney disease, stage 3b: Secondary | ICD-10-CM | POA: Diagnosis not present

## 2023-09-07 DIAGNOSIS — I129 Hypertensive chronic kidney disease with stage 1 through stage 4 chronic kidney disease, or unspecified chronic kidney disease: Secondary | ICD-10-CM | POA: Diagnosis not present

## 2023-09-07 DIAGNOSIS — D631 Anemia in chronic kidney disease: Secondary | ICD-10-CM | POA: Diagnosis not present

## 2023-09-07 LAB — COMPREHENSIVE METABOLIC PANEL WITH GFR
Albumin: 4.4 (ref 3.5–5.0)
Calcium: 9.2 (ref 8.7–10.7)
eGFR: 28

## 2023-09-07 LAB — BASIC METABOLIC PANEL WITH GFR
BUN: 19 (ref 4–21)
CO2: 23 — AB (ref 13–22)
Chloride: 105 (ref 99–108)
Creatinine: 2.3 — AB (ref 0.6–1.3)
Glucose: 135
Potassium: 4.2 meq/L (ref 3.5–5.1)
Sodium: 139 (ref 137–147)

## 2023-09-09 LAB — LAB REPORT - SCANNED: EGFR: 28

## 2023-09-09 NOTE — Progress Notes (Signed)
 Agree with the assessment and plan as outlined by Brigitte Canard, PA-C.  Will await records from [facility] to make an informed decision on colon cancer surveillance.

## 2023-09-13 ENCOUNTER — Telehealth: Payer: Self-pay | Admitting: Physician Assistant

## 2023-09-13 DIAGNOSIS — D649 Anemia, unspecified: Secondary | ICD-10-CM

## 2023-09-13 DIAGNOSIS — N1832 Chronic kidney disease, stage 3b: Secondary | ICD-10-CM

## 2023-09-13 NOTE — Telephone Encounter (Signed)
 RE: Follow-up colonoscopy and anemia. I saw patient 08/24/2023 to discuss repeat colonoscopy.  We requested previous colonoscopy records from [facility].  We have NOTyet received those reports.   I reviewed patient's chart again.  Labs done March 2025 showed mild anemia with hemoglobin 11.7.  Decreased kidney function with GFR 33.  I recommend schedule lab visit to follow-up with anemia and CKD. Please order labs: 1.  CBC, iron panel, ferritin, B12, folate.  Diagnosis anemia, unspecified. 2.  BMP.  Diagnosis: chronic kidney disease. If patient has iron deficiency anemia, then we will need to schedule repeat colonoscopy and possible EGD. Brigitte Canard, PA-C

## 2023-09-14 ENCOUNTER — Other Ambulatory Visit: Payer: Self-pay

## 2023-09-14 ENCOUNTER — Encounter: Payer: Self-pay | Admitting: Physician Assistant

## 2023-09-14 DIAGNOSIS — D649 Anemia, unspecified: Secondary | ICD-10-CM

## 2023-09-14 DIAGNOSIS — N1832 Chronic kidney disease, stage 3b: Secondary | ICD-10-CM

## 2023-09-14 NOTE — Progress Notes (Signed)
 George [name]

## 2023-09-14 NOTE — Telephone Encounter (Signed)
 Lab orders entered, pt notified via mychart.

## 2023-09-19 DIAGNOSIS — E782 Mixed hyperlipidemia: Secondary | ICD-10-CM | POA: Diagnosis not present

## 2023-09-19 DIAGNOSIS — I639 Cerebral infarction, unspecified: Secondary | ICD-10-CM | POA: Diagnosis not present

## 2023-09-19 DIAGNOSIS — I10 Essential (primary) hypertension: Secondary | ICD-10-CM | POA: Diagnosis not present

## 2023-09-19 DIAGNOSIS — Z8679 Personal history of other diseases of the circulatory system: Secondary | ICD-10-CM | POA: Diagnosis not present

## 2023-09-22 ENCOUNTER — Ambulatory Visit: Payer: Medicare Other

## 2023-09-27 NOTE — Telephone Encounter (Signed)
 pt wants to r/s called him he didnt pick up lvm

## 2023-10-03 ENCOUNTER — Encounter

## 2023-10-11 DIAGNOSIS — F330 Major depressive disorder, recurrent, mild: Secondary | ICD-10-CM | POA: Diagnosis not present

## 2023-10-11 DIAGNOSIS — F411 Generalized anxiety disorder: Secondary | ICD-10-CM | POA: Diagnosis not present

## 2023-10-25 ENCOUNTER — Other Ambulatory Visit: Payer: Self-pay | Admitting: Physician Assistant

## 2023-10-25 DIAGNOSIS — I10 Essential (primary) hypertension: Secondary | ICD-10-CM

## 2023-11-09 DIAGNOSIS — E559 Vitamin D deficiency, unspecified: Secondary | ICD-10-CM | POA: Diagnosis not present

## 2023-11-09 DIAGNOSIS — Z8679 Personal history of other diseases of the circulatory system: Secondary | ICD-10-CM | POA: Diagnosis not present

## 2023-11-09 DIAGNOSIS — E7889 Other lipoprotein metabolism disorders: Secondary | ICD-10-CM | POA: Diagnosis not present

## 2023-11-14 ENCOUNTER — Ambulatory Visit (INDEPENDENT_AMBULATORY_CARE_PROVIDER_SITE_OTHER): Payer: Medicare Other | Admitting: Physician Assistant

## 2023-11-14 ENCOUNTER — Encounter: Payer: Self-pay | Admitting: Physician Assistant

## 2023-11-14 VITALS — BP 126/66 | HR 46 | Temp 97.2°F | Ht 69.0 in | Wt 197.4 lb

## 2023-11-14 DIAGNOSIS — D649 Anemia, unspecified: Secondary | ICD-10-CM

## 2023-11-14 DIAGNOSIS — N1832 Chronic kidney disease, stage 3b: Secondary | ICD-10-CM | POA: Diagnosis not present

## 2023-11-14 DIAGNOSIS — I10 Essential (primary) hypertension: Secondary | ICD-10-CM

## 2023-11-14 DIAGNOSIS — Z85038 Personal history of other malignant neoplasm of large intestine: Secondary | ICD-10-CM

## 2023-11-14 DIAGNOSIS — F411 Generalized anxiety disorder: Secondary | ICD-10-CM | POA: Diagnosis not present

## 2023-11-14 DIAGNOSIS — G4701 Insomnia due to medical condition: Secondary | ICD-10-CM

## 2023-11-14 NOTE — Progress Notes (Signed)
 Patient ID: George [name]., male    DOB: May 15, 1945, 78 y.o.   MRN: [id number]   Assessment & Plan:  There are no diagnoses linked to this encounter.    Assessment and Plan Assessment & Plan Insomnia Difficulty initiating sleep, particularly when going to bed after midnight. Currently using melatonin without other sleep aids.  Anemia due to chronic kidney disease Anemia likely secondary to chronic kidney disease rather than iron deficiency. No recent blood work to confirm iron deficiency anemia. Decision made to defer colonoscopy due to age and lack of symptoms suggestive of gastrointestinal bleeding. He is at peace with this decision, considering the high mortality rate associated with potential complications from the procedure. - Consider blood work to check iron and ferritin levels if clinically indicated.  Chronic kidney disease Recent evaluation indicated well-managed condition without need for medication changes. Follows with [facility].   Depression Well-managed mood with current treatment regimen. Continues on Lexapro  and Wellbutrin  with virtual follow-ups with mental health provider. - Continue current medications Lexapro  and Wellbutrin . - Maintain virtual follow-ups with mental health provider.  Hypertension Blood pressure well-controlled. - Continue current antihypertensive regimen. -Follows w/ cardiology       Return in about 6 months (around 05/16/2024) for recheck/follow-up.    Subjective:    Chief Complaint  Patient presents with   Medical Management of Chronic Issues    Pt in office for 6 mon f/u and recheck; pt didn't schedule Colonoscopy, was told by their office didn't like completing on pts over 70, pt was never contacted back by office and pt decided since past of colon cancer been over 15 years, not feeling it is necessary;     HPI Discussed the use of AI scribe software for clinical note transcription with the patient, who  gave verbal consent to proceed.  History of Present Illness George [name]. Mr. Docken is an 78 year old male with chronic kidney disease and anemia who presents for a follow-up visit. Here with his wife.   He experiences difficulty with sleep, particularly when going to bed after 11:30 PM. He uses melatonin to aid sleep but does not use any other sleep aids.  He feels physically well overall and continues to see his cardiologist. A recent visit to [facility] indicated stable kidney function. He has a history of anemia, likely related to his chronic kidney disease, but has not had recent blood work at the kidney doctor. He did have some blood work done at the heart doctor.  He discusses his gastrointestinal health, noting that he has not had a colonoscopy in about seven years. He has not experienced any alarming symptoms such as bleeding, unusual stomach pain, weight loss, or persistent diarrhea.  Regarding his mental health, he states that he is managing well and is at a steady state. He continues to take Lexapro  and Wellbutrin  and has virtual visits with a mental health provider.  He has lost some weight, noting a decrease from 206 pounds last summer to 197 pounds currently. He mentions that he does not tolerate heat well and plans to vacation in cooler weather.     Past Medical History:  Diagnosis Date   Anxiety    Arthritis    OSTEO IN KNEE   Asthma    as child   Colon cancer (HCC) 2009   chemotherapy   Depression    Elevated prostate specific antigen (PSA)    Generalized anxiety disorder 04/09/2015   Hematospermia  Hx antineoplastic chemotherapy 2010   Hypertension    Idiopathic peripheral neuropathy    TOES OF BOTH FEET DUE TO CHEMO   Incomplete bladder emptying    Malignant neoplasm of prostate (HCC) 2010   radiation   Nodular prostate with lower urinary tract symptoms    Panic attacks 04/09/2015   Primary localized osteoarthrosis of the knee, right 05/19/2016    Prostatitis    S/P total knee arthroplasty, left 05/19/2016   Spermatocele    Weak urinary stream     Past Surgical History:  Procedure Laterality Date   COLON RESECTION   NOV 2009   COLONOSCOPY     PROSTATE BIOPSY     SPERMATOCELECTOMY Left 05/07/2014   Procedure: LEFT SPERMATOCELECTOMY;  Surgeon: Norleen JINNY Seltzer, MD;  Location: [facility];  Service: Urology;  Laterality: Left;   TONSILLECTOMY     TOTAL KNEE ARTHROPLASTY Left 04/21/2015   Procedure: LEFT TOTAL KNEE ARTHROPLASTY;  Surgeon: Lamar Millman, MD;  Location[facility];  Service: Orthopedics;  Laterality: Left;   TOTAL KNEE ARTHROPLASTY Right 05/31/2016   Procedure: TOTAL KNEE ARTHROPLASTY;  Surgeon: Lamar Millman, MD;  Location: [facility] OR;  Service: Orthopedics;  Laterality: Right;   TRANSURETHRAL RESECTION OF PROSTATE N/A 05/07/2014   Procedure: TRANSURETHRAL RESECTION OF THE PROSTATE WITH GYRUS INSTRUMENTS;  Surgeon: Norleen JINNY Seltzer, MD;  Location: [facility];  Service: Urology;  Laterality: N/A;    Family History  Problem Relation Age of Onset   Hypertension Mother    Stroke Father        age 12   Hypertension Father    Cancer Brother        neoplasm of brain   Restless legs syndrome Neg Hx    Sleep apnea Neg Hx     Social History   Tobacco Use   Smoking status: Former    Current packs/day: 0.00    Average packs/day: 0.3 packs/day for 3.0 years (0.8 ttl pk-yrs)    Types: Cigarettes    Start date: 04/12/1969    Quit date: 04/12/1972    Years since quitting: 51.6   Smokeless tobacco: Never   Tobacco comments:    smoked 1 cigarette per day SOME DAYS  Vaping Use   Vaping status: Never Used  Substance Use Topics   Alcohol use: Not Currently    Alcohol/week: 1.0 standard drink of alcohol    Types: 1 Cans of beer per week   Drug use: No     Allergies  Allergen Reactions   Gabapentin  Other (See Comments)    hallucinations   Amlodipine Other (See Comments)    Dizziness     Review of Systems NEGATIVE UNLESS OTHERWISE  INDICATED IN HPI      Objective:     BP 126/66 (BP Location: Left Arm, Patient Position: Sitting, Cuff Size: Normal)   Pulse (!) 46   Temp (!) 97.2 F (36.2 C) (Temporal)   Ht 5' 9 (1.753 m)   Wt 197 lb 6.4 oz (89.5 kg)   SpO2 97%   BMI 29.15 kg/m   Wt Readings from Last 3 Encounters:  11/14/23 197 lb 6.4 oz (89.5 kg)  08/24/23 198 lb 6 oz (90 kg)  08/15/23 203 lb (92.1 kg)    BP Readings from Last 3 Encounters:  11/14/23 126/66  08/24/23 110/70  08/15/23 136/71     Physical Exam Vitals and nursing note reviewed.  Constitutional:      General: He is not in acute distress.  Appearance: Normal appearance. He is not ill-appearing.  HENT:     Head: Normocephalic.  Eyes:     Extraocular Movements: Extraocular movements intact.     Conjunctiva/sclera: Conjunctivae normal.     Pupils: Pupils are equal, round, and reactive to light.  Cardiovascular:     Rate and Rhythm: Regular rhythm. Bradycardia present.  Pulmonary:     Effort: Pulmonary effort is normal. No respiratory distress.     Breath sounds: Normal breath sounds. No wheezing.  Musculoskeletal:     Cervical back: Normal range of motion.  Skin:    General: Skin is warm.  Neurological:     Mental Status: He is alert and oriented to person, place, and time.  Psychiatric:        Mood and Affect: Mood normal.        Behavior: Behavior normal.             Datrell Dunton M Santiel Topper, PA-C

## 2023-12-14 ENCOUNTER — Ambulatory Visit

## 2023-12-14 VITALS — BP 106/68 | HR 82 | Ht 69.0 in | Wt 197.0 lb

## 2023-12-14 DIAGNOSIS — Z0000 Encounter for general adult medical examination without abnormal findings: Secondary | ICD-10-CM

## 2023-12-14 DIAGNOSIS — F3341 Major depressive disorder, recurrent, in partial remission: Secondary | ICD-10-CM | POA: Diagnosis not present

## 2023-12-14 DIAGNOSIS — F411 Generalized anxiety disorder: Secondary | ICD-10-CM

## 2023-12-14 NOTE — Progress Notes (Signed)
 Subjective:   George Clink. is a 78 y.o. who presents for a Medicare Wellness preventive visit.  As a reminder, Annual Wellness Visits don't include a physical exam, and some assessments may be limited, especially if this visit is performed virtually. We may recommend an in-person follow-up visit with your provider if needed.  Visit Complete: Virtual I connected with  George [name]. on 12/14/23 by a audio enabled telemedicine application and verified that I am speaking with the correct person using two identifiers.  Patient Location: Home  Provider Location: Home Office  I discussed the limitations of evaluation and management by telemedicine. The patient expressed understanding and agreed to proceed.  Vital Signs: Because this visit was a virtual/telehealth visit, some criteria may be missing or patient reported. Any vitals not documented were not able to be obtained and vitals that have been documented are patient reported.  VideoDeclined- This patient declined Librarian, academic. Therefore the visit was completed with audio only.  Persons Participating in Visit: Patient.  AWV Questionnaire: No: Patient Medicare AWV questionnaire was not completed prior to this visit.  Cardiac Risk Factors include: advanced age (>31men, >35 women);dyslipidemia;hypertension;male gender     Objective:    Today's Vitals   12/14/23 0841 12/14/23 0843  BP: 106/68   Pulse: 82   Weight: 197 lb (89.4 kg)   Height: 5' 9 (1.753 m)   PainSc:  5    Body mass index is 29.09 kg/m.     12/14/2023    8:50 AM 06/28/2023   11:24 AM 09/16/2022    9:06 AM 08/18/2021   10:27 AM 07/13/2021    9:02 PM 05/13/2021    1:17 PM 04/02/2021    9:13 AM  Advanced Directives  Does Patient Have a Medical Advance Directive? No No Yes Yes Yes Yes No  Type of Best boy of [place];Living will Healthcare Power of [employer] Power of Attorney    Does  patient want to make changes to medical advance directive?     No - Patient declined  No - Patient declined  Copy of Healthcare Power of Attorney in Chart?   No - copy requested No - copy requested     Would patient like information on creating a medical advance directive? No - Patient declined      No - Patient declined    Current Medications (verified) Outpatient Encounter Medications as of 12/14/2023  Medication Sig   amiodarone (PACERONE) 200 MG tablet Take 100 mg by mouth daily. (Patient taking differently: Take 200 mg by mouth daily.)   atorvastatin  (LIPITOR ) 20 MG tablet Take 20 mg by mouth daily.   buPROPion  (WELLBUTRIN  XL) 150 MG 24 hr tablet Take 450 mg by mouth every morning.    ELIQUIS  5 MG TABS tablet Take 1 tablet by mouth twice daily   escitalopram  (LEXAPRO ) 10 MG tablet Take 10 mg by mouth daily.   felodipine  (PLENDIL ) 5 MG 24 hr tablet Take 5 mg by mouth in the morning and at bedtime.   hydrOXYzine  (ATARAX ) 10 MG tablet TAKE 1 TABLET BY MOUTH EVERY 6 HOURS AS NEEDED FOR ANXIETY OR  ITCHING   losartan  (COZAAR ) 100 MG tablet Take 100 mg by mouth daily.   metoprolol  succinate (TOPROL -XL) 25 MG 24 hr tablet Take 25 mg by mouth daily. (Patient taking differently: Take 50 mg by mouth daily.)   Multiple Vitamin (MULTIVITAMIN WITH MINERALS) TABS tablet Take 1 tablet by mouth daily.  spironolactone  (ALDACTONE ) 25 MG tablet Take 1 tablet by mouth once daily   vitamin B-12 1000 MCG tablet Take 1 tablet (1,000 mcg total) by mouth daily.   acetaminophen  (TYLENOL ) 500 MG tablet Take 500 mg by mouth every 6 (six) hours as needed for mild pain or headache. (Patient not taking: Reported on 12/14/2023)   No facility-administered encounter medications on file as of 12/14/2023.    Allergies (verified) Gabapentin  and Amlodipine   History: Past Medical History:  Diagnosis Date   Anxiety    Arthritis    OSTEO IN KNEE   Asthma    as child   Colon cancer (HCC) 2009   chemotherapy    Depression    Elevated prostate specific antigen (PSA)    Generalized anxiety disorder 04/09/2015   Hematospermia    Hx antineoplastic chemotherapy 2010   Hypertension    Idiopathic peripheral neuropathy    TOES OF BOTH FEET DUE TO CHEMO   Incomplete bladder emptying    Malignant neoplasm of prostate (HCC) 2010   radiation   Nodular prostate with lower urinary tract symptoms    Panic attacks 04/09/2015   Primary localized osteoarthrosis of the knee, right 05/19/2016   Prostatitis    S/P total knee arthroplasty, left 05/19/2016   Spermatocele    Weak urinary stream    Past Surgical History:  Procedure Laterality Date   COLON RESECTION   NOV 2009   COLONOSCOPY     PROSTATE BIOPSY     SPERMATOCELECTOMY Left 05/07/2014   Procedure: LEFT SPERMATOCELECTOMY;  Surgeon: Norleen JINNY Seltzer, MD;  Location: [facility];  Service: Urology;  Laterality: Left;   TONSILLECTOMY     TOTAL KNEE ARTHROPLASTY Left 04/21/2015   Procedure: LEFT TOTAL KNEE ARTHROPLASTY;  Surgeon: Lamar Millman, MD;  Location: [facility] OR;  Service: Orthopedics;  Laterality: Left;   TOTAL KNEE ARTHROPLASTY Right 05/31/2016   Procedure: TOTAL KNEE ARTHROPLASTY;  Surgeon: Lamar Millman, MD;  Location: [facility] OR;  Service: Orthopedics;  Laterality: Right;   TRANSURETHRAL RESECTION OF PROSTATE N/A 05/07/2014   Procedure: TRANSURETHRAL RESECTION OF THE PROSTATE WITH GYRUS INSTRUMENTS;  Surgeon: Norleen JINNY Seltzer, MD;  Location: [facility];  Service: Urology;  Laterality: N/A;   Family History  Problem Relation Age of Onset   Hypertension Mother    Stroke Father        age 66   Hypertension Father    Cancer Brother        neoplasm of brain   Restless legs syndrome Neg Hx    Sleep apnea Neg Hx    Social History   Socioeconomic History   Marital status: Married    Spouse name: Not on file   Number of children: 9   Years of education: Not on file   Highest education level: Not on file  Occupational History   Occupation: retire  Tobacco Use    Smoking status: Former    Current packs/day: 0.00    Average packs/day: 0.3 packs/day for 3.0 years (0.8 ttl pk-yrs)    Types: Cigarettes    Start date: 04/12/1969    Quit date: 04/12/1972    Years since quitting: 51.7   Smokeless tobacco: Never   Tobacco comments:    smoked 1 cigarette per day SOME DAYS  Vaping Use   Vaping status: Never Used  Substance and Sexual Activity   Alcohol use: Not Currently    Alcohol/week: 1.0 standard drink of alcohol    Types: 1 Cans of beer per week  Drug use: No   Sexual activity: Yes  Other Topics Concern   Not on file  Social History Narrative   [name] Vincent will take care of him after his surgery    Her cell is [phone]      Update 06/22/2022   Caffeine: maybe 2 sodas/day    Lives at home with wife   Right handed   Social Drivers of Health   Financial Resource Strain: High Risk (12/14/2023)   Overall Financial Resource Strain (CARDIA)    Difficulty of Paying Living Expenses: Hard  Food Insecurity: Food Insecurity Present (12/14/2023)   Hunger Vital Sign    Worried About Running Out of Food in the Last Year: Sometimes true    Ran Out of Food in the Last Year: Never true  Transportation Needs: No Transportation Needs (12/14/2023)   PRAPARE - Administrator, Civil Service (Medical): No    Lack of Transportation (Non-Medical): No  Physical Activity: Sufficiently Active (12/14/2023)   Exercise Vital Sign    Days of Exercise per Week: 7 days    Minutes of Exercise per Session: 30 min  Stress: Stress Concern Present (12/14/2023)   [employer] of Occupational Health - Occupational Stress Questionnaire    Feeling of Stress: Very much  Social Connections: Unknown (12/14/2023)   Social Connection and Isolation Panel    Frequency of Communication with Friends and Family: Once a week    Frequency of Social Gatherings with Friends and Family: Never    Attends Religious Services: 1 to 4 times per year    Active Member of [employer] or  Organizations: No    Attends Engineer, structural: Never    Marital Status: Patient declined    Tobacco Counseling Counseling given: Yes Tobacco comments: smoked 1 cigarette per day SOME DAYS    Clinical Intake:  Pre-visit preparation completed: Yes  Pain : 0-10 Pain Score: 5  Pain Type: Chronic pain Pain Location: Back Pain Orientation: Lower Pain Descriptors / Indicators: Constant Pain Onset: More than a month ago Pain Frequency: Constant     Nutritional Status: BMI 25 -29 Overweight Nutritional Risks: None Diabetes: No  Lab Results  Component Value Date   HGBA1C 5.6 11/12/2022   HGBA1C 5.6 07/14/2021   HGBA1C 5.4 02/28/2021     How often do you need to have someone help you when you read instructions, pamphlets, or other written materials from your doctor or pharmacy?: 1 - Never  Interpreter Needed?: No  Information entered by :: Loriana Samad W CMA (AAMA)   Activities of Daily Living     12/14/2023    8:50 AM  In your present state of health, do you have any difficulty performing the following activities:  Hearing? 0  Vision? 0  Difficulty concentrating or making decisions? 0  Walking or climbing stairs? 0  Dressing or bathing? 0  Doing errands, shopping? 0  Preparing Food and eating ? N  Using the Toilet? N  In the past six months, have you accidently leaked urine? N  Do you have problems with loss of bowel control? N  Managing your Medications? N  Managing your Finances? N  Housekeeping or managing your Housekeeping? N    Patient Care Team: Allwardt, Alyssa M, PA-C as PCP - General (Physician Assistant) Tobie Gordy POUR, MD as Consulting Physician (Nephrology) Levern Hutching, MD as Consulting Physician (Cardiology)  I have updated your Care Teams any recent Medical Services you may have received from other providers in the  past year.     Assessment:   This is a routine wellness examination for Basye.  Hearing/Vision screen Hearing  Screening - Comments:: Patient states he has a hard time hearing but can't tolerate hearing aids.  Vision Screening - Comments:: Patient is up to date with yearly eye exams.     Goals Addressed               This Visit's Progress     Get my neuropathy better controlled (pt-stated)        I want to get the neuropathy in my feet better controlled. It affects my ability to walk and it affects my balance.        Depression Screen     12/14/2023    9:06 AM 11/14/2023   11:40 AM 05/16/2023    9:09 AM 11/12/2022   11:22 AM 11/12/2022   11:21 AM 09/16/2022    9:04 AM 05/07/2022   10:41 AM  PHQ 2/9 Scores  PHQ - 2 Score 2 0 4 2 2  0 2  PHQ- 9 Score 11 8 11 6 6  7     Fall Risk     12/14/2023    9:42 AM 11/14/2023   11:40 AM 05/16/2023    9:09 AM 11/12/2022   11:22 AM 09/16/2022    9:07 AM  Fall Risk   Falls in the past year? 0 0 0 0 0  Number falls in past yr: 0 0 0 0 0  Injury with Fall? 0 0 0 0 0  Risk for fall due to : No Fall Risks No Fall Risks No Fall Risks No Fall Risks Impaired vision  Follow up Falls evaluation completed;Education provided;Falls prevention discussed Falls evaluation completed Falls evaluation completed  Falls prevention discussed    MEDICARE RISK AT HOME:  Medicare Risk at Home Any stairs in or around the home?: No If so, are there any without handrails?: No Home free of loose throw rugs in walkways, pet beds, electrical cords, etc?: Yes Adequate lighting in your home to reduce risk of falls?: Yes Life alert?: No Use of a cane, walker or w/c?: No Grab bars in the bathroom?: Yes Shower chair or bench in shower?: Yes Elevated toilet seat or a handicapped toilet?: Yes  TIMED UP AND GO:  Was the test performed?  No  Cognitive Function: 6CIT completed        12/14/2023    9:43 AM 09/16/2022    9:07 AM 08/18/2021   10:40 AM  6CIT Screen  What Year? 0 points 0 points 0 points  What month? 0 points 0 points 0 points  What time? 0 points 0 points 0 points  Count  back from 20 0 points 0 points 0 points  Months in reverse 0 points 0 points 0 points  Repeat phrase 0 points 0 points 0 points  Total Score 0 points 0 points 0 points    Immunizations Immunization History  Administered Date(s) Administered    sv, Bivalent, Protein Subunit Rsvpref,pf [place]) 03/18/2022   Influenza-Unspecified 02/25/2021   PFIZER(Purple Top)SARS-COV-2 Vaccination 05/07/2019, 05/28/2019   PNEUMOCOCCAL CONJUGATE-20 05/07/2022   Pfizer Covid-19 Vaccine Bivalent Booster 50yrs & up 02/16/2020   Zoster Recombinant(Shingrix) 04/22/2021, 06/22/2021    Screening Tests Health Maintenance  Topic Date Due   COVID-19 Vaccine (4 - 2025-26 season) 12/12/2023   DTaP/Tdap/Td (1 - Tdap) 05/15/2024 (Originally 11/04/1964)   INFLUENZA VACCINE  07/10/2024 (Originally 11/11/2023)   Medicare Annual Wellness (AWV)  12/13/2024  Pneumococcal Vaccine: 50+ Years  Completed   Zoster Vaccines- Shingrix  Completed   HPV VACCINES  Aged Out   Meningococcal B Vaccine  Aged Out    Health Maintenance  Health Maintenance Due  Topic Date Due   COVID-19 Vaccine (4 - 2025-26 season) 12/12/2023   Health Maintenance Items Addressed: CCM referral placed  Additional Screening:  Vision Screening: Recommended annual ophthalmology exams for early detection of glaucoma and other disorders of the eye. Would you like a referral to an eye doctor? No    Dental Screening: Recommended annual dental exams for proper oral hygiene  Community Resource Referral / Chronic Care Management: CRR required this visit?  Yes   CCM required this visit?  No   Plan:    I have personally reviewed and noted the following in the patient's chart:   Medical and social history Use of alcohol, tobacco or illicit drugs  Current medications and supplements including opioid prescriptions. Patient is not currently taking opioid prescriptions. Functional ability and status Nutritional status Physical activity Advanced  directives List of other physicians Hospitalizations, surgeries, and ER visits in previous 12 months Vitals Screenings to include cognitive, depression, and falls Referrals and appointments  In addition, I have reviewed and discussed with patient certain preventive protocols, quality metrics, and best practice recommendations. A written personalized care plan for preventive services as well as general preventive health recommendations were provided to patient.   Rosali Augello, CMA   12/14/2023   After Visit Summary: (MyChart) Due to this being a telephonic visit, the after visit summary with patients personalized plan was offered to patient via MyChart   Notes: Please refer to Routing Comments.

## 2023-12-14 NOTE — Patient Instructions (Addendum)
 [name] , Thank you for taking time out of your busy schedule to complete your Annual Wellness Visit with me. I enjoyed our conversation and look forward to speaking with you again next year. I, as well as your care team,  appreciate your ongoing commitment to your health goals. Please review the following plan we discussed and let me know if I can assist you in the future.  Your Game plan/ To Do List  Referrals/Orders: If you haven't heard from the office you've been referred to, please reach out to them at the phone provided.   A referral has been placed for you to check and see what additional resources are available to you.   If you haven't heard from anyone within the next 7 business days, please call them and let them know a referral has been placed for you Phone: [phone]  Follow up Visits: We will see or speak with you next year for your Next Medicare AWV with our clinical staff  December 18, 2024 at 9:20 am  If you are unable to login to your Mychart, please call [phone] for assistance.   Clinician Recommendations:  Aim for 30 minutes of exercise or brisk walking, 6-8 glasses of water, and 5 servings of fruits and vegetables each day.    Wishing you many blessings and good health during the next year until our next visit.  -Berneice Zettlemoyer   This is a list of the screenings recommended for you:  Health Maintenance  Topic Date Due   COVID-19 Vaccine (4 - 2025-26 season) 12/12/2023   DTaP/Tdap/Td vaccine (1 - Tdap) 05/15/2024*   Flu Shot  07/10/2024*   Medicare Annual Wellness Visit  12/13/2024   Pneumococcal Vaccine for age over 6  Completed   Zoster (Shingles) Vaccine  Completed   HPV Vaccine  Aged Out   Meningitis B Vaccine  Aged Out  *Topic was postponed. The date shown is not the original due date.    Advanced directives: (Declined) Advance directive discussed with you today. Even though you declined this today, please call our office should you change your mind, and  we can give you the proper paperwork for you to fill out. Advance Care Planning is important because it:  [x]  Makes sure you receive the medical care that is consistent with your values, goals, and preferences  [x]  It provides guidance to your family and loved ones and reduces their decisional burden about whether or not they are making the right decisions based on your wishes.  Follow the link provided in your after visit summary or read over the paperwork we have mailed to you to help you started getting your Advance Directives in place. If you need assistance in completing these, please reach out to us  so that we can help you!  Information on Advanced Care Planning can be found at [place]  Secretary of [facility] Advance Health Care Directives Advance Health Care Directives ([email or web address]   See attachments for Preventive Care and Fall Prevention Tips.

## 2023-12-15 ENCOUNTER — Telehealth: Payer: Self-pay | Admitting: *Deleted

## 2023-12-15 NOTE — Progress Notes (Signed)
 Complex Care Management Note  Care Guide Note 12/15/2023 Name: George [name]. MRN: [id number] DOB: [birth year]  George [name]. is a 78 y.o. year old male who sees Allwardt, Mardy HERO, PA-C for primary care. I reached out to [employer]. by phone today to offer complex care management services.  [name] was given information about Complex Care Management services today including:   The Complex Care Management services include support from the care team which includes your Nurse Care Manager, Clinical Social Worker, or Pharmacist.  The Complex Care Management team is here to help remove barriers to the health concerns and goals most important to you. Complex Care Management services are voluntary, and the patient may decline or stop services at any time by request to their care team member.   Complex Care Management Consent Status: Patient agreed to services and verbal consent obtained.   Follow up plan:  Telephone appointment with complex care management team member scheduled for:  12/23/2023  Encounter Outcome:  Patient Scheduled  Thedford Franks, CMA [place]  [facility], [facility] Guide Direct Dial: [phone]  Fax: [phone] Website: [email or web address]

## 2023-12-19 DIAGNOSIS — I639 Cerebral infarction, unspecified: Secondary | ICD-10-CM | POA: Diagnosis not present

## 2023-12-19 DIAGNOSIS — I10 Essential (primary) hypertension: Secondary | ICD-10-CM | POA: Diagnosis not present

## 2023-12-19 DIAGNOSIS — N184 Chronic kidney disease, stage 4 (severe): Secondary | ICD-10-CM | POA: Diagnosis not present

## 2023-12-19 DIAGNOSIS — I4891 Unspecified atrial fibrillation: Secondary | ICD-10-CM | POA: Diagnosis not present

## 2023-12-23 ENCOUNTER — Other Ambulatory Visit: Payer: Self-pay | Admitting: Licensed Clinical Social Worker

## 2023-12-23 DIAGNOSIS — I6300 Cerebral infarction due to thrombosis of unspecified precerebral artery: Secondary | ICD-10-CM

## 2023-12-23 NOTE — Patient Instructions (Signed)
 Visit Information  Thank you for taking time to visit with me today. Please don't hesitate to contact me if I can be of assistance to you before our next scheduled appointment.  Our next appointment is by telephone on 01/13/2024  Please call the care guide team at [phone] if you need to cancel or reschedule your appointment.   Following is a copy of your care plan:   Goals Addressed             This Visit's Progress    VBCI Social Work Care Plan       Problems:   Disease Management support and education needs related to Anxiety with Excessive Worry,  CSW Clinical Goal(s):   Over the next 6 weeks the Patient will demonstrate a reduction in symptoms related to Anxiety with Excessive Worry,  as evidence by patient reporting and GAD-7 score  Interventions:  Mental Health:  Evaluation of current treatment plan related to Anxiety with Excessive Worry, Active listening / Reflection utilized Depression screen reviewed Emotional Support Provided Mindfulness or Relaxation training provided Problem Solving /Task Center strategies reviewed Provided general psycho-education for mental health needs Quality of sleep assessed & Sleep Hygiene techniques promoted  Patient Goals/Self-Care Activities:  Continue taking your medication as prescribed.   Monitor for changes in mood and utilize coping skills as needed  Plan:   Telephone follow up appointment with care management team member scheduled for:  01/13/2024        Please call the Suicide and Crisis Lifeline: 988 if you are experiencing a Mental Health or Behavioral Health Crisis or need someone to talk to.  Patient verbalizes understanding of instructions and care plan provided today and agrees to view in MyChart. Active MyChart status and patient understanding of how to access instructions and care plan via MyChart confirmed with patient.     Alm Armor, LCSW [facility], [facility] Licensed Clinical Social Worker Care Coordinator [phone]

## 2023-12-23 NOTE — Patient Outreach (Signed)
 Complex Care Management   Visit Note  12/23/2023  Name:  George [name]. MRN: [id number] DOB: 1945/08/28  Situation: Referral received for Complex Care Management related to Mental/Behavioral Health diagnosis GAD I obtained verbal consent from Patient.  Visit completed with Patient  on the phone  Background:   Past Medical History:  Diagnosis Date   Anxiety    Arthritis    OSTEO IN KNEE   Asthma    as child   Colon cancer (HCC) 2009   chemotherapy   Depression    Elevated prostate specific antigen (PSA)    Generalized anxiety disorder 04/09/2015   Hematospermia    Hx antineoplastic chemotherapy 2010   Hypertension    Idiopathic peripheral neuropathy    TOES OF BOTH FEET DUE TO CHEMO   Incomplete bladder emptying    Malignant neoplasm of prostate (HCC) 2010   radiation   Nodular prostate with lower urinary tract symptoms    Panic attacks 04/09/2015   Primary localized osteoarthrosis of the knee, right 05/19/2016   Prostatitis    S/P total knee arthroplasty, left 05/19/2016   Spermatocele    Weak urinary stream     Assessment: Patient Reported Symptoms:  Cognitive Cognitive Status: Alert and oriented to person, place, and time, Normal speech and language skills, Insightful and able to interpret abstract concepts Cognitive/Intellectual Conditions Management [RPT]: None reported or documented in medical history or problem list   Health Maintenance Behaviors: None Healing Pattern: Unsure Health Facilitated by: Stress management  Neurological Neurological Review of Symptoms: Other: Oher Neurological Symptoms/Conditions [RPT]: Hx of stroke - 2 in the past two years. Left sidded weakness Neurological Management Strategies: Routine screening, Medication therapy Neurological Self-Management Outcome: 3 (uncertain)  HEENT HEENT Symptoms Reported: No symptoms reported      Cardiovascular Cardiovascular Symptoms Reported: Not assessed    Respiratory Respiratory Symptoms  Reported: Shortness of breath Other Respiratory Symptoms: Pt reports shortness of breath with movement over short distances - has reduced activity becuase of this. Respiratory Management Strategies: Activity, Routine screening, Medication therapy  Endocrine Endocrine Symptoms Reported: Not assessed    Gastrointestinal Gastrointestinal Symptoms Reported: Not assessed      Genitourinary Genitourinary Symptoms Reported: Not assessed    Integumentary Integumentary Symptoms Reported: Not assessed    Musculoskeletal Musculoskelatal Symptoms Reviewed: Unsteady gait, Joint pain Additional Musculoskeletal Details: Pt reports neuropathy in his feet, more painful at night which makes sleep more difficult Musculoskeletal Management Strategies: Activity, Medication therapy, Routine screening Musculoskeletal Self-Management Outcome: 4 (good) Falls in the past year?: No Number of falls in past year: 1 or less Was there an injury with Fall?: No Fall Risk Category Calculator: 0 Patient Fall Risk Level: Low Fall Risk Patient at Risk for Falls Due to: Impaired balance/gait Fall risk Follow up: Falls evaluation completed  Psychosocial Psychosocial Symptoms Reported: Anxiety - if selected complete GAD Additional Psychological Details: Reports he is seeing a psychiatrist - anxiety has improved with medication management Behavioral Management Strategies: Activity, Medication therapy, Coping strategies, Support system, [employer] [facility] Self-Management Outcome: 4 (good) Major Change/Loss/Stressor/Fears (CP): Medical condition, self Techniques to Cope with Loss/Stress/Change: Counseling, Medication Quality of Family Relationships: helpful, involved, supportive Do you feel physically threatened by others?: No    12/23/2023    PHQ2-9 Depression Screening   Little interest or pleasure in doing things Not at all  Feeling down, depressed, or hopeless More than half the days  PHQ-2 - Total  Score 2  Trouble falling or staying asleep, or  sleeping too much Not at all  Feeling tired or having little energy Nearly every day  Poor appetite or overeating  Not at all  Feeling bad about yourself - or that you are a failure or have let yourself or your family down More than half the days  Trouble concentrating on things, such as reading the newspaper or watching television More than half the days  Moving or speaking so slowly that other people could have noticed.  Or the opposite - being so fidgety or restless that you have been moving around a lot more than usual Not at all  Thoughts that you would be better off dead, or hurting yourself in some way Not at all  PHQ2-9 Total Score 9  If you checked off any problems, how difficult have these problems made it for you to do your work, take care of things at home, or get along with other people Somewhat difficult  Depression Interventions/Treatment Counseling, Medication    There were no vitals filed for this visit.  Medications Reviewed Today     Reviewed by Kit Alm LABOR, LCSW (Social Worker) on 12/23/23 at 1136  Med List Status: <None>   Medication Order Taking? Sig Documenting Provider Last Dose Status Informant  acetaminophen  (TYLENOL ) 500 MG tablet [id number]  Take 500 mg by mouth every 6 (six) hours as needed for mild pain or headache.  Patient not taking: Reported on 12/14/2023   [provider]  Active Self, Pharmacy Records, Spouse/Significant Other  amiodarone (PACERONE) 200 MG tablet [id number]  Take 100 mg by mouth daily.  Patient taking differently: Take 200 mg by mouth daily.   [provider]  Active Self, Pharmacy Records, Spouse/Significant Other  atorvastatin  (LIPITOR ) 20 MG tablet [id number] Yes Take 20 mg by mouth daily. [provider]  Active Self, Pharmacy Records, Spouse/Significant Other  buPROPion  (WELLBUTRIN  XL) 150 MG 24 hr tablet [id number] Yes Take 450 mg by mouth every morning.   [provider]  Active Self, Pharmacy Records, Spouse/Significant Other           Med Note CARLIS, EMMA   Thu Oct 20, 2017 10:20 AM)    ELIQUIS  5 MG TABS tablet [id number] Yes Take 1 tablet by mouth twice daily Allwardt, Alyssa M, PA-C  Active   escitalopram  (LEXAPRO ) 10 MG tablet [id number] Yes Take 10 mg by mouth daily. [provider]  Active Self, Pharmacy Records, Spouse/Significant Other  felodipine  (PLENDIL ) 5 MG 24 hr tablet [id number] Yes Take 5 mg by mouth in the morning and at bedtime. [provider]  Active Self, Pharmacy Records, Spouse/Significant Other  hydrOXYzine  (ATARAX ) 10 MG tablet [id number] Yes TAKE 1 TABLET BY MOUTH EVERY 6 HOURS AS NEEDED FOR ANXIETY OR  ITCHING Allwardt, Mardy HERO, PA-C  Active Self, Pharmacy Records, Spouse/Significant Other  losartan  (COZAAR ) 100 MG tablet [id number] Yes Take 100 mg by mouth daily. [provider]  Active Self, Pharmacy Records, Spouse/Significant Other  metoprolol  succinate (TOPROL -XL) 25 MG 24 hr tablet [id number]  Take 25 mg by mouth daily.  Patient taking differently: Take 50 mg by mouth daily.   [provider]  Active   Multiple Vitamin (MULTIVITAMIN WITH MINERALS) TABS tablet [id number] Yes Take 1 tablet by mouth daily. Maurice Sharlet RAMAN, PA-C  Active Self, Pharmacy Records, Spouse/Significant Other  spironolactone  (ALDACTONE ) 25 MG tablet [id number] Yes Take 1 tablet by mouth once daily Allwardt, Alyssa M, PA-C  Active   vitamin B-12 1000 MCG tablet [id number] Yes Take 1  tablet (1,000 mcg total) by mouth daily. Gonfa, Taye T, MD  Active Self, Pharmacy Records, Spouse/Significant Other            Recommendation:   Continue Current Plan of Care  Follow Up Plan:   Telephone follow up appointment date/time:  01/13/2024  Alm Armor, LCSW [place], [facility] Licensed Clinical Social Worker Care Coordinator [phone]

## 2024-01-03 ENCOUNTER — Telehealth: Payer: Self-pay

## 2024-01-03 NOTE — Progress Notes (Signed)
 Complex Care Management Note Care Guide Note  01/03/2024 Name: George [name]. MRN: [id number] DOB: 11-24-1945   Complex Care Management Outreach Attempts: An unsuccessful telephone outreach was attempted today to offer the patient information about available complex care management services.  Follow Up Plan:  Additional outreach attempts will be made to offer the patient complex care management information and services.   Encounter Outcome:  No Answer  Dreama [facility]  [facility], [facility] VBCI Assistant Direct Dial: [phone]  Fax: [phone]

## 2024-01-03 NOTE — Progress Notes (Signed)
 Complex Care Management Note  Care Guide Note 01/03/2024 Name: George [name]. MRN: [id number] DOB: January 22, 1946  George Kon. is a 78 y.o. year old male who sees Allwardt, Mardy HERO, PA-C for primary care. I reached out to [employer]. by phone today to offer complex care management services.  George [name] was given information about Complex Care Management services today including:   The Complex Care Management services include support from the care team which includes your Nurse Care Manager, Clinical Social Worker, or Pharmacist.  The Complex Care Management team is here to help remove barriers to the health concerns and goals most important to you. Complex Care Management services are voluntary, and the patient may decline or stop services at any time by request to their care team member.   Complex Care Management Consent Status: Patient agreed to services and verbal consent obtained.   Follow up plan:  Telephone appointment with complex care management team member scheduled for:  01/04/24 at 1:00 p.m.   Encounter Outcome:  Patient Scheduled  Dreama [facility]  [facility], [facility] VBCI Assistant Direct Dial: [phone]  Fax: [phone]

## 2024-01-04 ENCOUNTER — Telehealth: Payer: Self-pay

## 2024-01-13 ENCOUNTER — Other Ambulatory Visit: Payer: Self-pay | Admitting: Licensed Clinical Social Worker

## 2024-01-13 DIAGNOSIS — I10 Essential (primary) hypertension: Secondary | ICD-10-CM | POA: Diagnosis not present

## 2024-01-13 DIAGNOSIS — I639 Cerebral infarction, unspecified: Secondary | ICD-10-CM | POA: Diagnosis not present

## 2024-01-13 DIAGNOSIS — N184 Chronic kidney disease, stage 4 (severe): Secondary | ICD-10-CM | POA: Diagnosis not present

## 2024-01-13 DIAGNOSIS — Z8679 Personal history of other diseases of the circulatory system: Secondary | ICD-10-CM | POA: Diagnosis not present

## 2024-01-13 NOTE — Patient Instructions (Signed)
 Visit Information  Thank you for taking time to visit with me today. Please don't hesitate to contact me if I can be of assistance to you before our next scheduled appointment.  Your next care management appointment is no further scheduled appointments.   Please call the care guide team at [phone] if you need to cancel, schedule, or reschedule an appointment.   Please call the Suicide and Crisis Lifeline: 988 if you are experiencing a Mental Health or Behavioral Health Crisis or need someone to talk to.  Hale Level, LCSW [place], [facility] Licensed Clinical Social Worker Care Coordinator [phone]

## 2024-01-13 NOTE — Patient Outreach (Signed)
 Complex Care Management   Visit Note  01/13/2024  Name:  George [name]. MRN: [id number] DOB: 1945-09-30  Situation: Referral received for Complex Care Management related to Mental/Behavioral Health diagnosis GAD I obtained verbal consent from Patient.  Visit completed with Patient  on the phone  Background:   Past Medical History:  Diagnosis Date   Anxiety    Arthritis    OSTEO IN KNEE   Asthma    as child   Colon cancer (HCC) 2009   chemotherapy   Depression    Elevated prostate specific antigen (PSA)    Generalized anxiety disorder 04/09/2015   Hematospermia    Hx antineoplastic chemotherapy 2010   Hypertension    Idiopathic peripheral neuropathy    TOES OF BOTH FEET DUE TO CHEMO   Incomplete bladder emptying    Malignant neoplasm of prostate (HCC) 2010   radiation   Nodular prostate with lower urinary tract symptoms    Panic attacks 04/09/2015   Primary localized osteoarthrosis of the knee, right 05/19/2016   Prostatitis    S/P total knee arthroplasty, left 05/19/2016   Spermatocele    Weak urinary stream     Assessment: Patient Reported Symptoms:  Cognitive Cognitive Status: Alert and oriented to person, place, and time, Normal speech and language skills, Insightful and able to interpret abstract concepts Cognitive/Intellectual Conditions Management [RPT]: None reported or documented in medical history or problem list   Health Maintenance Behaviors: None  Neurological Neurological Review of Symptoms: Other: Oher Neurological Symptoms/Conditions [RPT]: Hx of stroke - 2 in the past two years. Left sidded weakness Neurological Management Strategies: Routine screening, Medication therapy Neurological Self-Management Outcome: 3 (uncertain)  HEENT HEENT Symptoms Reported: No symptoms reported      Cardiovascular Cardiovascular Symptoms Reported: Not assessed    Respiratory Respiratory Symptoms Reported: Shortness of breath    Endocrine Endocrine Symptoms  Reported: Not assessed    Gastrointestinal Gastrointestinal Symptoms Reported: Not assessed      Genitourinary Genitourinary Symptoms Reported: Not assessed    Integumentary Integumentary Symptoms Reported: Not assessed    Musculoskeletal Musculoskelatal Symptoms Reviewed: Unsteady gait, Joint pain Additional Musculoskeletal Details: Pt reports neuropathy in his feet, more painful at night which makes sleep more difficult      Taken Musculoskeletal Management Strategies: Activity, Medication therapy, Routine screening Musculoskeletal Self-Management Outcome: 4 (good)      Psychosocial Psychosocial Symptoms Reported: Anxiety - if selected complete GAD Additional Psychological Details: Reports he is seeing a psychiatrist - anxiety has improved with medication management Behavioral Management Strategies: Activity, Medication therapy, Coping strategies Behavioral Health Self-Management Outcome: 4 (good) Major Change/Loss/Stressor/Fears (CP): Medical condition, self Techniques to Cope with Loss/Stress/Change: Counseling, Medication      01/13/2024    PHQ2-9 Depression Screening   Little interest or pleasure in doing things Not at all  Feeling down, depressed, or hopeless More than half the days  PHQ-2 - Total Score 2  Trouble falling or staying asleep, or sleeping too much Not at all  Feeling tired or having little energy More than half the days  Poor appetite or overeating  Not at all  Feeling bad about yourself - or that you are a failure or have let yourself or your family down More than half the days  Trouble concentrating on things, such as reading the newspaper or watching television More than half the days  Moving or speaking so slowly that other people could have noticed.  Or the opposite - being so fidgety or restless  that you have been moving around a lot more than usual Not at all  Thoughts that you would be better off dead, or hurting yourself in some way Not at all  PHQ2-9  Total Score 8  If you checked off any problems, how difficult have these problems made it for you to do your work, take care of things at home, or get along with other people Somewhat difficult  Depression Interventions/Treatment Counseling, Medication    There were no vitals filed for this visit.  Medications Reviewed Today     Reviewed by Kit Alm LABOR, LCSW (Social Worker) on 01/13/24 at [phone]  Med List Status: <None>   Medication Order Taking? Sig Documenting Provider Last Dose Status Informant  acetaminophen  (TYLENOL ) 500 MG tablet [id number]  Take 500 mg by mouth every 6 (six) hours as needed for mild pain or headache.  Patient not taking: Reported on 12/14/2023   [provider]  Active Self, Pharmacy Records, Spouse/Significant Other  amiodarone (PACERONE) 200 MG tablet [id number]  Take 100 mg by mouth daily.  Patient taking differently: Take 200 mg by mouth daily.   [provider]  Active Self, Pharmacy Records, Spouse/Significant Other  atorvastatin  (LIPITOR ) 20 MG tablet [id number]  Take 20 mg by mouth daily. [provider]  Active Self, Pharmacy Records, Spouse/Significant Other  buPROPion  (WELLBUTRIN  XL) 150 MG 24 hr tablet [id number]  Take 450 mg by mouth every morning.  [provider]  Active Self, Pharmacy Records, Spouse/Significant Other           Med Note CARLIS, EMMA   Thu Oct 20, 2017 10:20 AM)    ELIQUIS  5 MG TABS tablet [id number]  Take 1 tablet by mouth twice daily Allwardt, Alyssa M, PA-C  Active   escitalopram  (LEXAPRO ) 10 MG tablet [id number]  Take 10 mg by mouth daily. [provider]  Active Self, Pharmacy Records, Spouse/Significant Other  felodipine  (PLENDIL ) 5 MG 24 hr tablet [id number]  Take 5 mg by mouth in the morning and at bedtime. [provider]  Active Self, Pharmacy Records, Spouse/Significant Other  hydrOXYzine  (ATARAX ) 10 MG tablet [id number]  TAKE 1 TABLET BY MOUTH EVERY 6 HOURS AS NEEDED FOR  ANXIETY OR  ITCHING Allwardt, Mardy HERO, PA-C  Active Self, Pharmacy Records, Spouse/Significant Other  losartan  (COZAAR ) 100 MG tablet [id number]  Take 100 mg by mouth daily. [provider]  Active Self, Pharmacy Records, Spouse/Significant Other  metoprolol  succinate (TOPROL -XL) 25 MG 24 hr tablet [id number]  Take 25 mg by mouth daily.  Patient taking differently: Take 50 mg by mouth daily.   [provider]  Active   Multiple Vitamin (MULTIVITAMIN WITH MINERALS) TABS tablet [id number]  Take 1 tablet by mouth daily. Maurice Sharlet RAMAN, PA-C  Active Self, Pharmacy Records, Spouse/Significant Other  spironolactone  (ALDACTONE ) 25 MG tablet [id number]  Take 1 tablet by mouth once daily Allwardt, Alyssa M, PA-C  Active   vitamin B-12 1000 MCG tablet [id number]  Take 1 tablet (1,000 mcg total) by mouth daily. Gonfa, Taye T, MD  Active Self, Pharmacy Records, Spouse/Significant Other            Recommendation:   Continue Current Plan of Care  Follow Up Plan:   Patient has met all care management goals. Care Management case will be closed. Patient has been provided contact information should new needs arise.   Alm Kit, LCSW [place], [facility] Licensed Clinical Social Worker Care Coordinator [phone]

## 2024-01-16 ENCOUNTER — Other Ambulatory Visit: Payer: Self-pay

## 2024-01-17 NOTE — Patient Outreach (Signed)
 Complex Care Management   Visit Note  01/16/2024  Name:  George [name]. MRN: [id number] DOB: 1946/04/05  Situation: Referral received for Complex Care Management related to Stroke, Chronic Kidney Disease, and HTN, and GAD. I obtained verbal consent from Patient.  Visit completed with Patient  on the phone  Background:   Past Medical History:  Diagnosis Date   Anxiety    Arthritis    OSTEO IN KNEE   Asthma    as child   Colon cancer (HCC) 2009   chemotherapy   Depression    Elevated prostate specific antigen (PSA)    Generalized anxiety disorder 04/09/2015   Hematospermia    Hx antineoplastic chemotherapy 2010   Hypertension    Idiopathic peripheral neuropathy    TOES OF BOTH FEET DUE TO CHEMO   Incomplete bladder emptying    Malignant neoplasm of prostate (HCC) 2010   radiation   Nodular prostate with lower urinary tract symptoms    Panic attacks 04/09/2015   Primary localized osteoarthrosis of the knee, right 05/19/2016   Prostatitis    S/P total knee arthroplasty, left 05/19/2016   Spermatocele    Weak urinary stream     Assessment: Patient Reported Symptoms:  Cognitive Cognitive Status: Alert and oriented to person, place, and time, Normal speech and language skills, Insightful and able to interpret abstract concepts Cognitive/Intellectual Conditions Management [RPT]: None reported or documented in medical history or problem list   Health Maintenance Behaviors: None Healing Pattern: Unsure Health Facilitated by: Healthy diet, Rest, Stress management  Neurological Neurological Review of Symptoms: Other: [place] Neurological Symptoms/Conditions [RPT]: History of 2 strokes in the past 2 years with some residual left sided weakness Neurological Management Strategies: Routine screening, Medication therapy, Adequate rest, Activity, Coping strategies, Diet modification Neurological Self-Management Outcome: 3 (uncertain)  HEENT HEENT Symptoms Reported: No symptoms  reported      Cardiovascular Cardiovascular Symptoms Reported: Not assessed Does patient have uncontrolled Hypertension?: No Cardiovascular Management Strategies: Medication therapy, Routine screening, Diet modification, Coping strategies, Adequate rest, Activity Cardiovascular Self-Management Outcome: 4 (good)  Respiratory Respiratory Symptoms Reported: No symptoms reported    Endocrine Endocrine Symptoms Reported: No symptoms reported Is patient diabetic?: No    Gastrointestinal Gastrointestinal Symptoms Reported: No symptoms reported      Genitourinary Genitourinary Symptoms Reported: No symptoms reported    Integumentary Integumentary Symptoms Reported: No symptoms reported    Musculoskeletal Musculoskelatal Symptoms Reviewed: Unsteady gait, Joint pain Musculoskeletal Management Strategies: Activity, Medication therapy, Routine screening, Coping strategies Musculoskeletal Self-Management Outcome: 4 (good) Falls in the past year?: No    Psychosocial Psychosocial Symptoms Reported: Anxiety - if selected complete GAD Behavioral Management Strategies: Activity, Medication therapy, Counseling, Coping strategies Behavioral Health Self-Management Outcome: 4 (good) Major Change/Loss/Stressor/Fears (CP): Medical condition, self Techniques to Cope with Loss/Stress/Change: Counseling, Medication, Diversional activities Quality of Family Relationships: supportive, involved, helpful Do you feel physically threatened by others?: No    01/17/2024    PHQ2-9 Depression Screening   Little interest or pleasure in doing things    Feeling down, depressed, or hopeless    PHQ-2 - Total Score    Trouble falling or staying asleep, or sleeping too much    Feeling tired or having little energy    Poor appetite or overeating     Feeling bad about yourself - or that you are a failure or have let yourself or your family down    Trouble concentrating on things, such as reading the newspaper or watching  television  Moving or speaking so slowly that other people could have noticed.  Or the opposite - being so fidgety or restless that you have been moving around a lot more than usual    Thoughts that you would be better off dead, or hurting yourself in some way    PHQ2-9 Total Score    If you checked off any problems, how difficult have these problems made it for you to do your work, take care of things at home, or get along with other people    Depression Interventions/Treatment      There were no vitals filed for this visit.  Medications Reviewed Today     Reviewed by Gordy Channing LABOR, RN (Registered Nurse) on 01/17/24 at 0110  Med List Status: <None>   Medication Order Taking? Sig Documenting Provider Last Dose Status Informant  acetaminophen  (TYLENOL ) 500 MG tablet [id number] Yes Take 500 mg by mouth every 6 (six) hours as needed for mild pain or headache. [provider]  Active Self, Pharmacy Records, Spouse/Significant Other  amiodarone (PACERONE) 200 MG tablet [id number] Yes Take 100 mg by mouth daily.  Patient taking differently: Take 200 mg by mouth daily.   [provider]  Active Self, Pharmacy Records, Spouse/Significant Other  atorvastatin  (LIPITOR ) 20 MG tablet [id number] Yes Take 20 mg by mouth daily. [provider]  Active Self, Pharmacy Records, Spouse/Significant Other  buPROPion  (WELLBUTRIN  XL) 150 MG 24 hr tablet [id number] Yes Take 450 mg by mouth every morning.  [provider]  Active Self, Pharmacy Records, Spouse/Significant Other           Med Note CARLIS, EMMA   Thu Oct 20, 2017 10:20 AM)    ELIQUIS  5 MG TABS tablet [id number] Yes Take 1 tablet by mouth twice daily Allwardt, Alyssa M, PA-C  Active   escitalopram  (LEXAPRO ) 10 MG tablet [id number] Yes Take 10 mg by mouth daily. [provider]  Active Self, Pharmacy Records, Spouse/Significant Other  felodipine  (PLENDIL ) 5 MG 24 hr tablet [id number] Yes Take 5 mg by mouth  in the morning and at bedtime. [provider]  Active Self, Pharmacy Records, Spouse/Significant Other  hydrOXYzine  (ATARAX ) 10 MG tablet [id number] Yes TAKE 1 TABLET BY MOUTH EVERY 6 HOURS AS NEEDED FOR ANXIETY OR  ITCHING Allwardt, Mardy HERO, PA-C  Active Self, Pharmacy Records, Spouse/Significant Other  losartan  (COZAAR ) 100 MG tablet [id number] Yes Take 100 mg by mouth daily. [provider]  Active Self, Pharmacy Records, Spouse/Significant Other  metoprolol  succinate (TOPROL -XL) 25 MG 24 hr tablet [id number] Yes Take 25 mg by mouth daily.  Patient taking differently: Take 50 mg by mouth daily.   [provider]  Active   Multiple Vitamin (MULTIVITAMIN WITH MINERALS) TABS tablet [id number] Yes Take 1 tablet by mouth daily. Maurice Sharlet RAMAN, PA-C  Active Self, Pharmacy Records, Spouse/Significant Other  spironolactone  (ALDACTONE ) 25 MG tablet [id number] Yes Take 1 tablet by mouth once daily Allwardt, Alyssa M, PA-C  Active   vitamin B-12 1000 MCG tablet [id number] Yes Take 1 tablet (1,000 mcg total) by mouth daily. Gonfa, Taye T, MD  Active Self, Pharmacy Records, Spouse/Significant Other            Recommendation:   Continue Current Plan of Care  Follow Up Plan:   Telephone follow up appointment date/time:  RN Care Manager Hendy Brindle L on 02/13/2024 at 1pm.  Akacia Boltz A. Gordy RN, BA, [facility], CRRN [place]  [facility] Population Health RN Care Manager Direct Dial: [phone]  Fax: [phone]

## 2024-01-17 NOTE — Patient Instructions (Signed)
 Visit Information  Thank you for taking time to visit with me today. Please don't hesitate to contact me if I can be of assistance to you before our next scheduled telephone appointment.  Our next appointment is by telephone on 02/13/24 at 1pm.  Following is a copy of your care plan:   Goals Addressed             This Visit's Progress    VBCI RN Care Plan       Problems:  Chronic Disease Management support and education needs related to CKD Stage 3b, Essential Hypertension, and GAD (the latter being followed up by LCSW)  Goal: Over the next 3 months the Patient will continue to work with Medical illustrator and/or Social Worker to address care management and care coordination needs related to Anxiety, CKD Stage 3b, and HTN as evidenced by adherence to care management team scheduled appointments      Interventions:    Chronic Kidney Disease Interventions: Evaluation of current treatment plan related to chronic kidney disease self management and patient's adherence to plan as established by provider      Provided education to patient re: stroke prevention, s/s of heart attack and stroke    Reviewed medications with patient and discussed importance of compliance    Advised patient, providing education and rationale, to monitor blood pressure daily and record, calling PCP for findings outside established parameters    Discussed complications of poorly controlled blood pressure such as heart disease, stroke, circulatory complications, vision complications, kidney impairment, sexual dysfunction    Discussed plans with patient for ongoing care management follow up and provided patient with direct contact information for care management team    Screening for signs and symptoms of depression related to chronic disease state      Discussed the impact of chronic kidney disease on daily life and mental health and acknowledged and normalized feelings of disempowerment, fear, and frustration    Assessed  social determinant of health barriers    Provided education on kidney disease progression    Discussed patient's history of two strokes in the past 2 years with some residual left-sided weakness Support coping and stress management by recognizing current strategies and assist in developing new strategies such as mindfulness, journaling, relaxation techniques, problem-solving    Last practice recorded BP readings:  BP Readings from Last 3 Encounters:  12/14/23 106/68  11/14/23 126/66  08/24/23 110/70   Most recent eGFR/CrCl:  Lab Results  Component Value Date   EGFR 28.0 09/09/2023    No components found for: CRCL    Hypertension Interventions: Last practice recorded BP readings:  BP Readings from Last 3 Encounters:  12/14/23 106/68  11/14/23 126/66  08/24/23 110/70   Most recent eGFR/CrCl:  Lab Results  Component Value Date   EGFR 28.0 09/09/2023    No components found for: CRCL  Evaluation of current treatment plan related to hypertension self management and patient's adherence to plan as established by provider Provided education to patient re: stroke prevention, s/s of heart attack and stroke Reviewed medications with patient and discussed importance of compliance Counseled on the importance of exercise goals with target of 150 minutes per week Discussed plans with patient for ongoing care management follow up and provided patient with direct contact information for care management team Advised patient, providing education and rationale, to monitor blood pressure daily and record, calling PCP for findings outside established parameters Discussed complications of poorly controlled blood pressure such as heart  disease, stroke, circulatory complications, vision complications, kidney impairment, sexual dysfunction Screening for signs and symptoms of depression related to chronic disease state  Assessed social determinant of health barriers  Patient Self-Care Activities:   Attend all scheduled provider appointments Attend church or other social activities Call pharmacy for medication refills 3-7 days in advance of running out of medications Call provider office for new concerns or questions  Perform all self care activities independently  Perform IADL's (shopping, preparing meals, housekeeping, managing finances) independently Take medications as prescribed   Work with the social worker to address care coordination needs and will continue to work with the clinical team to address health care and disease management related needs check blood pressure daily keep a blood pressure log take blood pressure log to all doctor appointments call doctor for signs and symptoms of high blood pressure keep all doctor appointments take medications for blood pressure exactly as prescribed begin an exercise program report new symptoms to your doctor eat more whole grains, fruits and vegetables, lean meats and healthy fats  Plan:  The patient has been provided with contact information for the care management team and has been advised to call with any health related questions or concerns.  Next appointment with RN Care Manager Channing CROME is scheduled for 02/13/24 at 1pm             Patient verbalizes understanding of instructions and care plan provided today and agrees to view in MyChart. Active MyChart status and patient understanding of how to access instructions and care plan via MyChart confirmed with patient.     The patient has been provided with contact information for the care management team and has been advised to call with any health related questions or concerns.   Please call the care guide team at [phone] if you need to cancel or reschedule your appointment.   Please call 1-800-273-TALK (toll free, 24 hour hotline) if you are experiencing a Mental Health or Behavioral Health Crisis or need someone to talk to.  Hrithik Boschee A. Gordy RN, BA, [facility],  CRRN [place]  [facility] Population Health RN Care Manager Direct Dial: [phone]  Fax: [phone]

## 2024-01-21 ENCOUNTER — Other Ambulatory Visit: Payer: Self-pay | Admitting: Physician Assistant

## 2024-01-21 DIAGNOSIS — I10 Essential (primary) hypertension: Secondary | ICD-10-CM

## 2024-01-23 DIAGNOSIS — F411 Generalized anxiety disorder: Secondary | ICD-10-CM | POA: Diagnosis not present

## 2024-01-23 DIAGNOSIS — F330 Major depressive disorder, recurrent, mild: Secondary | ICD-10-CM | POA: Diagnosis not present

## 2024-02-13 ENCOUNTER — Other Ambulatory Visit: Payer: Self-pay

## 2024-02-20 DIAGNOSIS — F411 Generalized anxiety disorder: Secondary | ICD-10-CM | POA: Diagnosis not present

## 2024-02-20 DIAGNOSIS — F330 Major depressive disorder, recurrent, mild: Secondary | ICD-10-CM | POA: Diagnosis not present

## 2024-02-27 ENCOUNTER — Ambulatory Visit: Payer: Self-pay

## 2024-02-27 NOTE — Telephone Encounter (Signed)
 FYI Only or Action Required?: FYI only for provider: appointment scheduled on 03/02/2024.  Patient was last seen in primary care on 11/14/2023 by Allwardt, Mardy HERO, PA-C.  Called Nurse Triage reporting Shortness of Breath.  Symptoms began about a month ago and worsening.  Interventions attempted: Nothing.  Symptoms are: gradually worsening.  Triage Disposition: See PCP Within 2 Weeks  Patient/caregiver understands and will follow disposition?: YesCopied from [facility]SPAN #[id number]. Topic: Clinical - Red Word Triage >> Feb 27, 2024  4:06 PM Wess RAMAN wrote: Red Word that prompted transfer to Nurse Triage: Patient's wife, [name], stated patient is experiencing shortness of breath and chest pain after walking short distances. Reason for Disposition  [1] MILD longstanding difficulty breathing (e.g., minimal/no SOB at rest, SOB with walking, pulse < 100) AND [2] SAME as normal  Answer Assessment - Initial Assessment Questions 1. RESPIRATORY STATUS: Describe your breathing? (e.g., wheezing, shortness of breath, unable to speak, severe coughing)      SOB w/exertion 2. ONSET: When did this breathing problem begin?      Month and worsening 3. PATTERN Does the difficult breathing come and go, or has it been constant since it started?      Comes and goes 4. SEVERITY: How bad is your breathing? (e.g., mild, moderate, severe)      Mild to Moderate  5. RECURRENT SYMPTOM: Have you had difficulty breathing before? If Yes, ask: When was the last time? and What happened that time?      no 6. CARDIAC HISTORY: Do you have any history of heart disease? (e.g., heart attack, angina, bypass surgery, angioplasty)      Afib 7. LUNG HISTORY: Do you have any history of lung disease?  (e.g., pulmonary embolus, asthma, emphysema)     no 8. CAUSE: What do you think is causing the breathing problem?      unknown 9. OTHER SYMPTOMS: Do you have any other symptoms? (e.g., chest pain, cough, dizziness,  fever, runny nose)     no 10. O2 SATURATION MONITOR:  Do you use an oxygen saturation monitor (pulse oximeter) at home? If Yes, ask: What is your reading (oxygen level) today? What is your usual oxygen saturation reading? (e.g., 95%)       unknown 11. PREGNANCY: Is there any chance you are pregnant? When was your last menstrual period?       na 12. TRAVEL: Have you traveled out of the country in the last month? (e.g., travel history, exposures)       na  Pt's wife stated she did not report chest pain only SOB.  History on colon and prostate cancer and noticed spots on lungs not sure if cancer or not.  Protocols used: Breathing Difficulty-A-AH

## 2024-02-28 ENCOUNTER — Telehealth: Payer: Self-pay | Admitting: Physician Assistant

## 2024-02-28 NOTE — Telephone Encounter (Signed)
 Called pt and advised and stated he will see PCP this Friday and pt has been advised if symptoms get worse to let us  know and pt verbalized understanding.

## 2024-02-28 NOTE — Telephone Encounter (Signed)
 Called Patient to offer sooner appointment with a different Provider. Patient declined.

## 2024-02-28 NOTE — Telephone Encounter (Signed)
 FYI for pt upcoming appt this week, please advise if pt needs to be seen sooner or additional recommendations

## 2024-02-29 ENCOUNTER — Other Ambulatory Visit: Payer: Self-pay | Admitting: Physician Assistant

## 2024-03-01 NOTE — Telephone Encounter (Signed)
 Called pt and lvm concerning dosage. Last fill sent 07/13/23 and if taking BID pt should have been out 3 months ago. Wanting to confirm dose before sending refill. Advised pt to call our office with cb number

## 2024-03-02 ENCOUNTER — Ambulatory Visit (INDEPENDENT_AMBULATORY_CARE_PROVIDER_SITE_OTHER): Admitting: Physician Assistant

## 2024-03-02 ENCOUNTER — Ambulatory Visit: Payer: Self-pay | Admitting: Physician Assistant

## 2024-03-02 ENCOUNTER — Other Ambulatory Visit: Payer: Self-pay | Admitting: Physician Assistant

## 2024-03-02 ENCOUNTER — Encounter: Payer: Self-pay | Admitting: Physician Assistant

## 2024-03-02 ENCOUNTER — Other Ambulatory Visit (HOSPITAL_COMMUNITY): Payer: Self-pay | Admitting: Physician Assistant

## 2024-03-02 ENCOUNTER — Ambulatory Visit (INDEPENDENT_AMBULATORY_CARE_PROVIDER_SITE_OTHER)

## 2024-03-02 VITALS — BP 138/78 | HR 55 | Temp 97.2°F | Ht 69.0 in | Wt 193.6 lb

## 2024-03-02 DIAGNOSIS — D631 Anemia in chronic kidney disease: Secondary | ICD-10-CM

## 2024-03-02 DIAGNOSIS — N184 Chronic kidney disease, stage 4 (severe): Secondary | ICD-10-CM

## 2024-03-02 DIAGNOSIS — R0609 Other forms of dyspnea: Secondary | ICD-10-CM

## 2024-03-02 DIAGNOSIS — I4891 Unspecified atrial fibrillation: Secondary | ICD-10-CM

## 2024-03-02 DIAGNOSIS — Z8546 Personal history of malignant neoplasm of prostate: Secondary | ICD-10-CM

## 2024-03-02 DIAGNOSIS — R634 Abnormal weight loss: Secondary | ICD-10-CM | POA: Diagnosis not present

## 2024-03-02 DIAGNOSIS — R7989 Other specified abnormal findings of blood chemistry: Secondary | ICD-10-CM

## 2024-03-02 DIAGNOSIS — N1832 Chronic kidney disease, stage 3b: Secondary | ICD-10-CM

## 2024-03-02 DIAGNOSIS — I69354 Hemiplegia and hemiparesis following cerebral infarction affecting left non-dominant side: Secondary | ICD-10-CM

## 2024-03-02 LAB — COMPREHENSIVE METABOLIC PANEL WITH GFR
ALT: 15 U/L (ref 0–53)
AST: 18 U/L (ref 0–37)
Albumin: 4.1 g/dL (ref 3.5–5.2)
Alkaline Phosphatase: 89 U/L (ref 39–117)
BUN: 19 mg/dL (ref 6–23)
CO2: 24 meq/L (ref 19–32)
Calcium: 9.1 mg/dL (ref 8.4–10.5)
Chloride: 106 meq/L (ref 96–112)
Creatinine, Ser: 2.24 mg/dL — ABNORMAL HIGH (ref 0.40–1.50)
GFR: 27.04 mL/min — ABNORMAL LOW (ref >60.00)
Glucose, Bld: 101 mg/dL — ABNORMAL HIGH (ref 70–99)
Potassium: 4.2 meq/L (ref 3.5–5.1)
Sodium: 138 meq/L (ref 135–145)
Total Bilirubin: 0.8 mg/dL (ref 0.2–1.2)
Total Protein: 7.6 g/dL (ref 6.0–8.3)

## 2024-03-02 LAB — CBC WITH DIFFERENTIAL/PLATELET
Basophils Absolute: 0 K/uL (ref 0.0–0.1)
Basophils Relative: 0.7 % (ref 0.0–3.0)
Eosinophils Absolute: 0.3 K/uL (ref 0.0–0.7)
Eosinophils Relative: 4.4 % (ref 0.0–5.0)
HCT: 39.7 % (ref 39.0–52.0)
Hemoglobin: 13 g/dL (ref 13.0–17.0)
Lymphocytes Relative: 20.5 % (ref 12.0–46.0)
Lymphs Abs: 1.3 K/uL (ref 0.7–4.0)
MCHC: 32.6 g/dL (ref 30.0–36.0)
MCV: 86.2 fl (ref 78.0–100.0)
Monocytes Absolute: 0.4 K/uL (ref 0.1–1.0)
Monocytes Relative: 5.6 % (ref 3.0–12.0)
Neutro Abs: 4.4 K/uL (ref 1.4–7.7)
Neutrophils Relative %: 68.8 % (ref 43.0–77.0)
Platelets: 316 K/uL (ref 150.0–400.0)
RBC: 4.61 Mil/uL (ref 4.22–5.81)
RDW: 14.2 % (ref 11.5–15.5)
WBC: 6.4 K/uL (ref 4.0–10.5)

## 2024-03-02 LAB — IBC + FERRITIN
Ferritin: 57.9 ng/mL (ref 22.0–322.0)
Iron: 77 ug/dL (ref 42–165)
Saturation Ratios: 23.5 % (ref 20.0–50.0)
TIBC: 327.6 ug/dL (ref 250.0–450.0)
Transferrin: 234 mg/dL (ref 212.0–360.0)

## 2024-03-02 LAB — B12 AND FOLATE PANEL
Folate: 5.1 ng/mL — ABNORMAL LOW (ref >5.9)
Vitamin B-12: >1500 pg/mL — ABNORMAL HIGH (ref 211–911)

## 2024-03-02 LAB — TSH: TSH: 1.67 u[IU]/mL (ref 0.35–5.50)

## 2024-03-02 LAB — D-DIMER, QUANTITATIVE: D-Dimer, Quant: 0.66 ug{FEU}/mL — ABNORMAL HIGH (ref <0.50)

## 2024-03-02 NOTE — Progress Notes (Signed)
 Patient acknowledge understanding of the results. He knows that he will be getting a call for schedule a V/Q Scan, hopefully today. He also understands that I things get worse he needs to go straight to the ER.

## 2024-03-02 NOTE — Patient Instructions (Signed)
 Labs today CXR today  ER if suddenly worse  Keep phone close by - if any abnormalities on chest xray or labs, might need chest CT  Need records from Dr. Levern

## 2024-03-02 NOTE — Progress Notes (Signed)
 Patient ID: George Frasier., male    DOB: 07-Aug-1945, 78 y.o.   MRN: [id number]   Assessment & Plan:  Dyspnea on exertion -     CBC with Differential/Platelet -     Comprehensive metabolic panel with GFR -     IBC + Ferritin -     B12 and Folate Panel -     TSH -     D-dimer, quantitative -     DG Chest 2 View; Future  Hemiparesis affecting left side as late effect of cerebrovascular accident [facility])  Atrial fibrillation with RVR (HCC)  History of prostate cancer  Anemia due to stage 3b chronic kidney disease (HCC) -     CBC with Differential/Platelet -     IBC + Ferritin -     B12 and Folate Panel  Abnormal weight loss -     CBC with Differential/Platelet -     Comprehensive metabolic panel with GFR -     IBC + Ferritin -     B12 and Folate Panel -     TSH -     D-dimer, quantitative -     DG Chest 2 View; Future  Chronic kidney disease, stage 3b (HCC) -     Comprehensive metabolic panel with GFR    Assessment & Plan Dyspnea on exertion Chronic dyspnea on exertion, worsening over the past few months. No associated chest pain, cough, or wheezing. No history of smoking or COPD. No recent EKG changes. Concern for possible pulmonary embolism due to chronic nature and worsening symptoms. - Ordered chest x-ray - Ordered D-dimer test - Ordered blood work including CBC, CMP, TSH, B12, folate, ferritin - Will consider chest CT if x-ray or blood work indicates abnormalities  Abnormal weight loss Weight loss of 7 pounds since February. No associated appetite changes or night sweats. Further evaluation needed to rule out underlying causes. - Ordered blood work including CBC, CMP, TSH, B12, folate, ferritin  Iron deficiency anemia Potential contributor to dyspnea. Previous low iron levels noted. No recent blood work to assess current status. - Ordered blood work including CBC, CMP, TSH, B12, folate, ferritin  Chronic atrial fibrillation Managed with Eliquis . No recent  missed doses. Last cardiology visit was last month. No recent EKG changes. Continues to follow with cardiologist every three months. - Refilled Eliquis  prescription - Will obtain records from cardiologist Dr. Levern  Chronic kidney disease, stage 3b Chronic kidney disease stage 3b, managed by nephrology. - Ordered CMP to assess kidney function  Hemiparesis following left-sided cerebral infarction Left-sided hemiparesis secondary to cerebral infarction in November 2022. No new neurological symptoms reported.  History of prostate cancer Prostate cancer - last radiation in 2016.  - Continue follow-up with urology      Return in about 4 weeks (around 03/30/2024) for recheck/follow-up.    Subjective:    Chief Complaint  Patient presents with   Shortness of Breath    Reports shortness of breath has been consistent for five years. States he is taking the Eliquis  2x daily. No chest pain. No new symptoms.     Shortness of Breath   Discussed the use of AI scribe software for clinical note transcription with the patient, who gave verbal consent to proceed.  History of Present Illness George [name]. George [name] is an 78 year old male with atrial fibrillation and a history of stroke who presents with worsening shortness of breath. Here with his wife.  He has been experiencing  worsening shortness of breath over the past few months, which becomes labored with minimal exertion, such as walking to the trash can or getting the mail. He feels out of breath after walking 50 to 75 feet. No shortness of breath while sitting or lying down. He is concerned about two dark spots on his lungs identified on a chest x-ray about ten years ago and their potential progression.  He has a history of atrial fibrillation and is on Eliquis , which he takes as prescribed. He also has a history of a stroke in November 2022, resulting in left-sided weakness. He sees a cardiologist every three months, with the  last visit in early fall of last month. No chest pain, calf pain, swelling, or dizziness. No history of blood clots.  His sleep has improved recently, averaging about seven hours per night, up from four to five hours previously. No shortness of breath while sleeping. He has a history of prostate cancer and chronic kidney disease stage 3B, for which he follows with nephrology. He also has a history of iron deficiency but denies feeling cold recently.  He has not experienced any recent changes in appetite, night sweats, or unusual symptoms. He wears compression socks and denies coughing, wheezing, or spitting up. He has a history of asthma as a child but reports no issues in adulthood.     Past Medical History:  Diagnosis Date   Anxiety    Arthritis    OSTEO IN KNEE   Asthma    as child   Colon cancer (HCC) 2009   chemotherapy   Depression    Elevated prostate specific antigen (PSA)    Generalized anxiety disorder 04/09/2015   Hematospermia    Hx antineoplastic chemotherapy 2010   Hypertension    Idiopathic peripheral neuropathy    TOES OF BOTH FEET DUE TO CHEMO   Incomplete bladder emptying    Malignant neoplasm of prostate (HCC) 2010   radiation   Nodular prostate with lower urinary tract symptoms    Panic attacks 04/09/2015   Primary localized osteoarthrosis of the knee, right 05/19/2016   Prostatitis    S/P total knee arthroplasty, left 05/19/2016   Spermatocele    Weak urinary stream     Past Surgical History:  Procedure Laterality Date   COLON RESECTION   NOV 2009   COLONOSCOPY     PROSTATE BIOPSY     SPERMATOCELECTOMY Left 05/07/2014   Procedure: LEFT SPERMATOCELECTOMY;  Surgeon: Norleen JINNY Seltzer, MD;  Location: [facility];  Service: Urology;  Laterality: Left;   TONSILLECTOMY     TOTAL KNEE ARTHROPLASTY Left 04/21/2015   Procedure: LEFT TOTAL KNEE ARTHROPLASTY;  Surgeon: Lamar Millman, MD;  Location: [facility] OR;  Service: Orthopedics;  Laterality: Left;   TOTAL KNEE ARTHROPLASTY  Right 05/31/2016   Procedure: TOTAL KNEE ARTHROPLASTY;  Surgeon: Lamar Millman, MD;  Location: [facility] OR;  Service: Orthopedics;  Laterality: Right;   TRANSURETHRAL RESECTION OF PROSTATE N/A 05/07/2014   Procedure: TRANSURETHRAL RESECTION OF THE PROSTATE WITH GYRUS INSTRUMENTS;  Surgeon: Norleen JINNY Seltzer, MD;  Location: [facility];  Service: Urology;  Laterality: N/A;    Family History  Problem Relation Age of Onset   Hypertension Mother    Stroke Father        age 80   Hypertension Father    Cancer Brother        neoplasm of brain   Restless legs syndrome Neg Hx    Sleep apnea Neg Hx  Social History   Tobacco Use   Smoking status: Former    Current packs/day: 0.00    Average packs/day: 0.3 packs/day for 3.0 years (0.8 ttl pk-yrs)    Types: Cigarettes    Start date: 04/12/1969    Quit date: 04/12/1972    Years since quitting: 51.9   Smokeless tobacco: Never   Tobacco comments:    smoked 1 cigarette per day SOME DAYS  Vaping Use   Vaping status: Never Used  Substance Use Topics   Alcohol use: Not Currently    Alcohol/week: 1.0 standard drink of alcohol    Types: 1 Cans of beer per week   Drug use: No     Allergies  Allergen Reactions   Gabapentin  Other (See Comments)    hallucinations   Amlodipine Other (See Comments)    Dizziness     Review of Systems  Respiratory:  Positive for shortness of breath.    NEGATIVE UNLESS OTHERWISE INDICATED IN HPI      Objective:     BP 138/78   Pulse (!) 55   Temp (!) 97.2 F (36.2 C) (Temporal)   Ht 5' 9 (1.753 m)   Wt 193 lb 9.6 oz (87.8 kg)   SpO2 97%   BMI 28.59 kg/m   Wt Readings from Last 3 Encounters:  03/02/24 193 lb 9.6 oz (87.8 kg)  12/14/23 197 lb (89.4 kg)  11/14/23 197 lb 6.4 oz (89.5 kg)    BP Readings from Last 3 Encounters:  03/02/24 138/78  12/14/23 106/68  11/14/23 126/66     Physical Exam Vitals and nursing note reviewed.  Constitutional:      General: He is not in acute distress.    Appearance:  Normal appearance. He is not ill-appearing.  HENT:     Head: Normocephalic.  Eyes:     Extraocular Movements: Extraocular movements intact.     Conjunctiva/sclera: Conjunctivae normal.     Pupils: Pupils are equal, round, and reactive to light.  Cardiovascular:     Rate and Rhythm: Regular rhythm. Bradycardia present.  Pulmonary:     Effort: Pulmonary effort is normal. No respiratory distress.     Breath sounds: Normal breath sounds. No wheezing, rhonchi or rales.  Musculoskeletal:     Cervical back: Normal range of motion.     Right lower leg: No edema.     Left lower leg: No edema.  Skin:    General: Skin is warm.  Neurological:     Mental Status: He is alert and oriented to person, place, and time.     Motor: Weakness (left lower extremity weaker than R, chronic since CVA) present.  Psychiatric:        Mood and Affect: Mood normal.        Behavior: Behavior normal.             Anokhi Shannon M Shed Nixon, PA-C

## 2024-03-05 ENCOUNTER — Ambulatory Visit (HOSPITAL_COMMUNITY)
Admission: RE | Admit: 2024-03-05 | Discharge: 2024-03-05 | Disposition: A | Discharge status: Home / Self Care | Source: Ambulatory Visit | Attending: Physician Assistant | Admitting: Physician Assistant

## 2024-03-05 ENCOUNTER — Ambulatory Visit: Payer: Self-pay | Admitting: Physician Assistant

## 2024-03-05 ENCOUNTER — Encounter (HOSPITAL_COMMUNITY)
Admission: RE | Admit: 2024-03-05 | Discharge: 2024-03-05 | Disposition: A | Discharge status: Home / Self Care | Source: Ambulatory Visit | Attending: Physician Assistant | Admitting: Physician Assistant

## 2024-03-05 DIAGNOSIS — R7989 Other specified abnormal findings of blood chemistry: Secondary | ICD-10-CM

## 2024-03-05 DIAGNOSIS — N184 Chronic kidney disease, stage 4 (severe): Secondary | ICD-10-CM | POA: Insufficient documentation

## 2024-03-05 DIAGNOSIS — R0609 Other forms of dyspnea: Secondary | ICD-10-CM | POA: Insufficient documentation

## 2024-03-05 DIAGNOSIS — R0602 Shortness of breath: Secondary | ICD-10-CM | POA: Diagnosis not present

## 2024-03-05 MED ORDER — TECHNETIUM TO 99M ALBUMIN AGGREGATED
4.4000 | Freq: Once | INTRAVENOUS | Status: AC | PRN
  Administered 2024-03-05: 4.4 via INTRAVENOUS

## 2024-03-07 ENCOUNTER — Other Ambulatory Visit: Payer: Self-pay

## 2024-03-07 ENCOUNTER — Encounter: Payer: Self-pay | Admitting: Physician Assistant

## 2024-03-07 DIAGNOSIS — R0602 Shortness of breath: Secondary | ICD-10-CM

## 2024-03-07 NOTE — Progress Notes (Signed)
 Referral sent, records request sent.

## 2024-03-12 ENCOUNTER — Telehealth: Payer: Self-pay

## 2024-03-19 ENCOUNTER — Ambulatory Visit

## 2024-03-19 VITALS — BP 118/78 | HR 106 | Temp 97.8°F | Ht 72.0 in | Wt 199.8 lb

## 2024-03-19 DIAGNOSIS — Z23 Encounter for immunization: Secondary | ICD-10-CM | POA: Diagnosis not present

## 2024-03-19 DIAGNOSIS — R0609 Other forms of dyspnea: Secondary | ICD-10-CM | POA: Diagnosis not present

## 2024-03-19 NOTE — Addendum Note (Signed)
 Addended by: Dashanique Brownstein M on: 03/19/2024 12:03 PM   Modules accepted: Orders

## 2024-03-19 NOTE — Progress Notes (Signed)
 Pulmonology Office Visit   Subjective:  Patient ID: George Chill., male    DOB: 1946-02-20  MRN: [id number]  Referred by: Allwardt, Mardy HERO, PA-C  CC:  Chief Complaint  Patient presents with   Shortness of Breath    SOB with exertion.    HPI George Dorey. is a 78 y.o. male with A-fib, CVA, hypertension, prostate cancer status post radiation presents for evaluation of dyspnea on exertion.  Respective notes from provider reviewed as appropriate to gather relevant information for patient care.   Discussed the use of AI scribe software for clinical note transcription with the patient, who gave verbal consent to proceed.  History of Present Illness   George Brinkley. George [name] is an 78 year old male with atrial fibrillation and cerebrovascular accident who presents with dyspnea on exertion. He was referred by his primary care provider for evaluation of his shortness of breath.  He has experienced shortness of breath since approximately 2010, which has progressively worsened over the last few years. He can only walk about 20 feet on a flat surface before becoming short of breath, whereas he previously had no limitations and could walk several miles. No associated cough, phlegm production, wheezing, chest tightness, or chest pain. No leg pain or swelling. No shortness of breath when lying flat.  He has a history of smoking, having smoked cigarettes from 1969 to 1972 and small cigars from 2005 to 2009 and again in 2012. He smoked up to two cigarettes a day and one cigar a day during his smoking periods. He has not smoked since 2012.  He has a history of asthma as a child but reports that he 'grew out of it' and has not used inhalers or been hospitalized for asthma since then. He does not recall any family history of asthma.  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 He denies any current exposure to pets,  strong perfumes, smoke, or chemicals affecting his breathing.  He mentions a history of being told about two spots on his lungs approximately 15 years ago, but no recent imaging has confirmed this. He has not experienced any seasonal changes affecting his breathing, although he notes a personal history of seasonal depression related to light changes.       ASTHMA:  First diagnosed: ?as a kid. Never used inhaler or hospitalized.  FH: none in family.  Triggers: no triggers.  Times albuterol used: none. Not on any inhalers.   Lung Health: Functional status: 20 feet for last 2 yrs.  Covid vaccine: 2 doses.  Influenza vaccine: wants.  Pneumonococcal vaccine: UTD.  Smoking: cig from [age]. But quit cigars 2005. 2 cig/d  when smoking. 1 cig/d.  Occupational exposure/pets: no pets. 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   PRIOR TESTS and IMAGING: Echo November 2022: A-fib, EF 60-65%, RV function normal.  No valvular abnormalities.    Chest x-ray November 2025: Reviewed by me borderline hyperinflation but no other parenchymal lung disease. Spiro 2017: Ratio 75%, FEV1 3 [103% predicted, FVC 4.05.  Eos abs 700 in 2022.  VQ scan November 2025: Low risk for PE.   Allergies: Gabapentin  and Amlodipine  Current Outpatient Medications:    acetaminophen  (TYLENOL ) 500 MG tablet, Take 500 mg by mouth every 6 (six) hours as needed for mild pain or headache., Disp: , Rfl:    amiodarone (PACERONE) 200 MG  tablet, Take 100 mg by mouth daily. (Patient taking differently: Take 200 mg by mouth daily.), Disp: , Rfl:    atorvastatin  (LIPITOR ) 20 MG tablet, Take 20 mg by mouth daily., Disp: , Rfl:    buPROPion  (WELLBUTRIN  XL) 150 MG 24 hr tablet, Take 450 mg by mouth every morning. , Disp: , Rfl:    ELIQUIS  5 MG TABS tablet, Take 1 tablet by mouth twice daily, Disp: 180 tablet, Rfl: 0   hydrOXYzine  (ATARAX ) 10 MG tablet, TAKE 1 TABLET BY MOUTH EVERY 6 HOURS AS NEEDED FOR  ANXIETY OR  ITCHING, Disp: 120 tablet, Rfl: 0   losartan  (COZAAR ) 100 MG tablet, Take 100 mg by mouth daily., Disp: , Rfl:    metoprolol  succinate (TOPROL -XL) 25 MG 24 hr tablet, Take 25 mg by mouth daily., Disp: , Rfl:    Multiple Vitamin (MULTIVITAMIN WITH MINERALS) TABS tablet, Take 1 tablet by mouth daily., Disp: , Rfl:    spironolactone  (ALDACTONE ) 25 MG tablet, Take 1 tablet by mouth once daily, Disp: 90 tablet, Rfl: 0   vitamin B-12 1000 MCG tablet, Take 1 tablet (1,000 mcg total) by mouth daily., Disp: , Rfl:    hydrochlorothiazide (HYDRODIURIL) 25 MG tablet, hydroCHLOROthiazide 25 mg, Disp: , Rfl:  Past Medical History:  Diagnosis Date   Anxiety    Arthritis    OSTEO IN KNEE   Asthma    as child   Colon cancer (HCC) 2009   chemotherapy   Depression    Elevated prostate specific antigen (PSA)    Generalized anxiety disorder 04/09/2015   Hematospermia    Hx antineoplastic chemotherapy 2010   Hypertension    Idiopathic peripheral neuropathy    TOES OF BOTH FEET DUE TO CHEMO   Incomplete bladder emptying    Malignant neoplasm of prostate (HCC) 2010   radiation   Nodular prostate with lower urinary tract symptoms    Panic attacks 04/09/2015   Primary localized osteoarthrosis of the knee, right 05/19/2016   Prostatitis    S/P total knee arthroplasty, left 05/19/2016   Spermatocele    Weak urinary stream    Past Surgical History:  Procedure Laterality Date   COLON RESECTION   NOV 2009   COLONOSCOPY     PROSTATE BIOPSY     SPERMATOCELECTOMY Left 05/07/2014   Procedure: LEFT SPERMATOCELECTOMY;  Surgeon: Norleen JINNY Seltzer, MD;  Location: [facility];  Service: Urology;  Laterality: Left;   TONSILLECTOMY     TOTAL KNEE ARTHROPLASTY Left 04/21/2015   Procedure: LEFT TOTAL KNEE ARTHROPLASTY;  Surgeon: Lamar Millman, MD;  Location: [facility] OR;  Service: Orthopedics;  Laterality: Left;   TOTAL KNEE ARTHROPLASTY Right 05/31/2016   Procedure: TOTAL KNEE ARTHROPLASTY;  Surgeon: Lamar Millman, MD;   Location: [facility] OR;  Service: Orthopedics;  Laterality: Right;   TRANSURETHRAL RESECTION OF PROSTATE N/A 05/07/2014   Procedure: TRANSURETHRAL RESECTION OF THE PROSTATE WITH GYRUS INSTRUMENTS;  Surgeon: Norleen JINNY Seltzer, MD;  Location: [facility];  Service: Urology;  Laterality: N/A;   Family History  Problem Relation Age of Onset   Hypertension Mother    Stroke Father        age 3   Hypertension Father    Cancer Brother        neoplasm of brain   Restless legs syndrome Neg Hx    Sleep apnea Neg Hx    Social History   Socioeconomic History   Marital status: Married    Spouse name: Not on file   Number  of children: 9   Years of education: Not on file   Highest education level: Not on file  Occupational History   Occupation: retire  Tobacco Use   Smoking status: Former    Current packs/day: 0.00    Average packs/day: 0.3 packs/day for 3.0 years (0.8 ttl pk-yrs)    Types: Cigarettes    Start date: 04/12/1969    Quit date: 04/12/1972    Years since quitting: 51.9   Smokeless tobacco: Never   Tobacco comments:    smoked 1 cigarette per day SOME DAYS  Vaping Use   Vaping status: Never Used  Substance and Sexual Activity   Alcohol use: Not Currently    Alcohol/week: 1.0 standard drink of alcohol    Types: 1 Cans of beer per week   Drug use: No   Sexual activity: Yes  Other Topics Concern   Not on file  Social History Narrative   [name] Vincent will take care of him after his surgery    Her cell is [phone]      Update 06/22/2022   Caffeine: maybe 2 sodas/day    Lives at home with wife   Right handed   Social Drivers of Health   Financial Resource Strain: High Risk (12/14/2023)   Overall Financial Resource Strain (CARDIA)    Difficulty of Paying Living Expenses: Hard  Food Insecurity: Food Insecurity Present (12/23/2023)   Hunger Vital Sign    Worried About Running Out of Food in the Last Year: Sometimes true    Ran Out of Food in the Last Year: Never true  Transportation  Needs: No Transportation Needs (12/23/2023)   PRAPARE - Administrator, Civil Service (Medical): No    Lack of Transportation (Non-Medical): No  Physical Activity: Sufficiently Active (12/14/2023)   Exercise Vital Sign    Days of Exercise per Week: 7 days    Minutes of Exercise per Session: 30 min  Stress: Stress Concern Present (12/14/2023)   [employer] of Occupational Health - Occupational Stress Questionnaire    Feeling of Stress: Very much  Social Connections: Unknown (12/14/2023)   Social Connection and Isolation Panel    Frequency of Communication with Friends and Family: Once a week    Frequency of Social Gatherings with Friends and Family: Never    Attends Religious Services: 1 to 4 times per year    Active Member of [employer] or Organizations: No    Attends Banker Meetings: Never    Marital Status: Patient declined  Catering Manager Violence: Not At Risk (12/23/2023)   Humiliation, Afraid, Rape, and Kick questionnaire    Fear of Current or Ex-Partner: No    Emotionally Abused: No    Physically Abused: No    Sexually Abused: No       Objective:  BP 118/78   Pulse (!) 106   Temp 97.8 F (36.6 C) (Oral)   Ht 6' (1.829 m)   Wt 199 lb 12.8 oz (90.6 kg)   SpO2 99% Comment: room air  BMI 27.10 kg/m  BMI Readings from Last 3 Encounters:  03/19/24 27.10 kg/m  03/02/24 28.59 kg/m  12/14/23 29.09 kg/m    Physical Exam: Physical Exam   ENT: Normal mucosa. No hypertrophy of inferior turbinates. Tonsils are normal sized. Modified Mallampati score is normal. PULMONARY: Lungs clear to auscultation bilaterally, no adventitious breath sounds. CARDIOVASCULAR: Irregular heart rate due to AFib, S1 S2 normal, no murmurs. ABDOMEN: Abdomen soft, nontender. Bowel sounds are normal. EXTREMITIES:  No peripheral edema noted.       Diagnostic Review:  Last metabolic panel Lab Results  Component Value Date   GLUCOSE 101 (H) 03/02/2024   NA 138 03/02/2024    K 4.2 03/02/2024   CL 106 03/02/2024   CO2 24 03/02/2024   BUN 19 03/02/2024   CREATININE 2.24 (H) 03/02/2024   GFR 27.04 (L) 03/02/2024   CALCIUM  9.1 03/02/2024   PHOS 3.0 03/05/2021   PROT 7.6 03/02/2024   ALBUMIN  4.1 03/02/2024   BILITOT 0.8 03/02/2024   ALKPHOS 89 03/02/2024   AST 18 03/02/2024   ALT 15 03/02/2024   ANIONGAP 6 06/28/2023         Assessment & Plan:   Assessment & Plan DOE (dyspnea on exertion)  Orders:   Pulmonary Function Test; Future   CT Chest Wo Contrast; Future  Needs flu shot        Assessment and Plan    Dyspnea on exertion Chronic dyspnea since 2010, worsening over last 2 year. No significant smoking history. Past concrete dust exposure. VQ scan ruled out pulmonary embolism. Elevated markers suggest asthma, but symptoms do not align. Previous imaging unremarkable. CT scan and lung function test needed. - Ordered chest CT scan. - Ordered lung function test. - Schedule follow-up to discuss results.  Atrial fibrillation Irregular heart rate. No heart failure symptoms. - If CT and lung function test negative, consider cardiology referral and repeat ECHO.         Notes from PCP 03/02/2024 reviewed as to gather relevant information for patient care and formulating plan.    Return for Sch after CT scan, sch after PFTs.   I personally spent a total of 30 minutes in the care of the patient today including preparing to see the patient, getting/reviewing separately obtained history, performing a medically appropriate exam/evaluation, counseling and educating, placing orders, documenting clinical information in the EHR, independently interpreting results, and communicating results.   Krishay Faro, MD

## 2024-03-19 NOTE — Patient Instructions (Signed)
  VISIT SUMMARY:  Mr. Ende, you came in today because of your shortness of breath, which has been getting worse over the years. We discussed your history of smoking, past exposure to concrete dust, and your childhood asthma. We also reviewed your current symptoms and previous medical history.  YOUR PLAN:  -DYSPNEA ON EXERTION: Dyspnea on exertion means experiencing shortness of breath during physical activities. We need to investigate further to understand the cause of your symptoms. We have ordered a chest CT scan and a lung function test to get a clearer picture of your lung health. Once we have the results, we will schedule a follow-up appointment to discuss them.  -ATRIAL FIBRILLATION: Atrial fibrillation is an irregular and often rapid heart rate that can increase your risk of strokes and other heart-related complications. Since you do not have symptoms of heart failure, we will first wait for the results of your CT scan and lung function test. If these tests do not explain your shortness of breath, we may refer you to a cardiologist for further evaluation.                       Contains text generated by [employer]bridge.                                 Contains text generated by Abridge.

## 2024-04-03 ENCOUNTER — Ambulatory Visit: Admitting: Physician Assistant

## 2024-04-09 ENCOUNTER — Ambulatory Visit
Admission: RE | Admit: 2024-04-09 | Discharge: 2024-04-09 | Disposition: A | Discharge status: Home / Self Care | Source: Ambulatory Visit

## 2024-04-09 DIAGNOSIS — R0609 Other forms of dyspnea: Secondary | ICD-10-CM

## 2024-04-11 ENCOUNTER — Ambulatory Visit (INDEPENDENT_AMBULATORY_CARE_PROVIDER_SITE_OTHER)

## 2024-04-11 DIAGNOSIS — R0609 Other forms of dyspnea: Secondary | ICD-10-CM | POA: Diagnosis not present

## 2024-04-11 LAB — PULMONARY FUNCTION TEST
DL/VA % pred: 94 %
DL/VA: 3.64 ml/min/mmHg/L
DLCO cor % pred: 77 %
DLCO cor: 20.06 ml/min/mmHg
DLCO unc % pred: 73 %
DLCO unc: 19.09 ml/min/mmHg
FEF 25-75 Post: 3.13 L/s
FEF 25-75 Pre: 2.73 L/s
FEF2575-%Change-Post: 14 %
FEF2575-%Pred-Post: 144 %
FEF2575-%Pred-Pre: 126 %
FEV1-%Change-Post: 3 %
FEV1-%Pred-Post: 97 %
FEV1-%Pred-Pre: 93 %
FEV1-Post: 3.05 L
FEV1-Pre: 2.93 L
FEV1FVC-%Change-Post: 2 %
FEV1FVC-%Pred-Pre: 111 %
FEV6-%Change-Post: 1 %
FEV6-%Pred-Post: 91 %
FEV6-%Pred-Pre: 90 %
FEV6-Post: 3.73 L
FEV6-Pre: 3.69 L
FEV6FVC-%Pred-Post: 106 %
FEV6FVC-%Pred-Pre: 106 %
FVC-%Change-Post: 1 %
FVC-%Pred-Post: 85 %
FVC-%Pred-Pre: 84 %
FVC-Post: 3.73 L
FVC-Pre: 3.69 L
Post FEV1/FVC ratio: 82 %
Post FEV6/FVC ratio: 100 %
Pre FEV1/FVC ratio: 80 %
Pre FEV6/FVC Ratio: 100 %
RV % pred: 115 %
RV: 3.2 L
TLC % pred: 92 %
TLC: 6.88 L

## 2024-04-11 NOTE — Patient Instructions (Signed)
 Full PFT performed today.

## 2024-04-11 NOTE — Progress Notes (Signed)
 Full PFT performed today.

## 2024-04-13 ENCOUNTER — Other Ambulatory Visit (HOSPITAL_COMMUNITY): Payer: Self-pay | Admitting: Cardiology

## 2024-04-13 ENCOUNTER — Encounter (HOSPITAL_COMMUNITY): Payer: Self-pay | Admitting: Cardiology

## 2024-04-13 DIAGNOSIS — R079 Chest pain, unspecified: Secondary | ICD-10-CM

## 2024-04-18 ENCOUNTER — Ambulatory Visit (INDEPENDENT_AMBULATORY_CARE_PROVIDER_SITE_OTHER)

## 2024-04-18 VITALS — BP 132/72 | HR 65 | Temp 98.3°F | Ht 72.0 in | Wt 199.2 lb

## 2024-04-18 DIAGNOSIS — J4530 Mild persistent asthma, uncomplicated: Secondary | ICD-10-CM

## 2024-04-18 DIAGNOSIS — R0609 Other forms of dyspnea: Secondary | ICD-10-CM

## 2024-04-18 MED ORDER — BUDESONIDE-FORMOTEROL FUMARATE 160-4.5 MCG/ACT IN AERO: 2.0000 | INHALATION_SPRAY | Freq: Two times a day (BID) | RESPIRATORY_TRACT | 6 refills | Status: AC

## 2024-04-18 MED ORDER — ALBUTEROL SULFATE HFA 108 (90 BASE) MCG/ACT IN AERS: 2.0000 | INHALATION_SPRAY | Freq: Four times a day (QID) | RESPIRATORY_TRACT | 6 refills | Status: AC | PRN

## 2024-04-18 NOTE — Progress Notes (Signed)
 "  Pulmonology Office Visit   Subjective:  Patient ID: George Caporale., male    DOB: 10/23/1945  MRN: [id number]  Referred by: Allwardt, Mardy HERO, PA-C  CC:  Chief Complaint  Patient presents with   Shortness of Breath    SOB persistent.  Review PFT and CT scan December 2025.    Shortness of Breath   Dodge Ator. is a 79 y.o. male with A-fib, CVA, hypertension, prostate cancer status post radiation presents for evaluation of dyspnea on exertion.  Respective notes from provider reviewed as appropriate to gather relevant information for patient care.   Discussed the use of AI scribe software for clinical note transcription with the patient, who gave verbal consent to proceed.  History of Present Illness   George Klee. is an 79 year old male who presents with dyspnea on exertion.  He experiences shortness of breath primarily during physical activities such as walking or working, becoming short of breath after about twenty feet. No shortness of breath while sitting or lying down. Last week, he noticed a brief period of improved breathing without any identifiable cause.  No associated cough, phlegm production, or chest pain. No exacerbation of symptoms due to strong perfumes, chemical smells, or seasonal changes. No seasonal allergies.  His past medical history includes childhood asthma, which he believes he outgrew. He has a history of smoking in the 1960s and 1970s, including occasional cigar use, but he did not smoke heavily.  He recently discontinued amiodarone due to concerns about its impact on his breathing.  A recent CT scan showed mild cylindrical bronchiectasis. An echocardiogram from 2022 was performed.       ASTHMA:  First diagnosed: ?as a kid. Never used inhaler or hospitalized.  FH: none in family.  Triggers: no triggers.  Times albuterol  used: none. Not on any inhalers.   Lung Health: Functional status: 20 feet for last 2 yrs.  Covid vaccine: 2 doses.   Influenza vaccine: UTD.  Pneumonococcal vaccine: UTD.  Smoking: cig from [age]. But quit cigars 2005. 2 cig/d  when smoking. 1 cig/d.  Occupational exposure/pets: no pets. 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   PRIOR TESTS and IMAGING: Echo November 2022: A-fib, EF 60-65%, RV function normal.  No valvular abnormalities.    Chest x-ray November 2025: Reviewed by me borderline hyperinflation but no other parenchymal lung disease. Spiro 2017: Ratio 75%, FEV1 3 [103% predicted, FVC 4.05.  Eos abs 700 in 2022.  VQ scan November 2025: Low risk for PE. CT chest December 2025: Mild cylindrical bronchiectasis.  Otherwise normal lung parenchyma. PFT December 2025: Borderline DLCO with air trapping.  Otherwise normal PFT.  Likely early COPD versus asthma.   Allergies: Gabapentin  and Amlodipine  Current Outpatient Medications:    acetaminophen  (TYLENOL ) 500 MG tablet, Take 500 mg by mouth every 6 (six) hours as needed for mild pain or headache., Disp: , Rfl:    albuterol  (VENTOLIN  HFA) 108 (90 Base) MCG/ACT inhaler, Inhale 2 puffs into the lungs every 6 (six) hours as needed for wheezing or shortness of breath., Disp: 8 g, Rfl: 6   atorvastatin  (LIPITOR ) 20 MG tablet, Take 20 mg by mouth daily., Disp: , Rfl:    budesonide -formoterol  (SYMBICORT ) 160-4.5 MCG/ACT inhaler, Inhale 2 puffs into the lungs in the morning and at bedtime., Disp: 1 each, Rfl: 6   buPROPion  (WELLBUTRIN  XL) 150 MG 24 hr tablet, Take 450 mg by mouth every morning. ,  Disp: , Rfl:    ELIQUIS  5 MG TABS tablet, Take 1 tablet by mouth twice daily, Disp: 180 tablet, Rfl: 0   hydrochlorothiazide (HYDRODIURIL) 25 MG tablet, hydroCHLOROthiazide 25 mg, Disp: , Rfl:    hydrOXYzine  (ATARAX ) 10 MG tablet, TAKE 1 TABLET BY MOUTH EVERY 6 HOURS AS NEEDED FOR ANXIETY OR  ITCHING, Disp: 120 tablet, Rfl: 0   losartan  (COZAAR ) 100 MG tablet, Take 100 mg by mouth daily., Disp: , Rfl:    metoprolol  succinate  (TOPROL -XL) 25 MG 24 hr tablet, Take 25 mg by mouth daily., Disp: , Rfl:    Multiple Vitamin (MULTIVITAMIN WITH MINERALS) TABS tablet, Take 1 tablet by mouth daily., Disp: , Rfl:    spironolactone  (ALDACTONE ) 25 MG tablet, Take 1 tablet by mouth once daily, Disp: 90 tablet, Rfl: 0   vitamin B-12 1000 MCG tablet, Take 1 tablet (1,000 mcg total) by mouth daily., Disp: , Rfl:  Past Medical History:  Diagnosis Date   Anxiety    Arthritis    OSTEO IN KNEE   Asthma    as child   Colon cancer (HCC) 2009   chemotherapy   Depression    Elevated prostate specific antigen (PSA)    Generalized anxiety disorder 04/09/2015   Hematospermia    Hx antineoplastic chemotherapy 2010   Hypertension    Idiopathic peripheral neuropathy    TOES OF BOTH FEET DUE TO CHEMO   Incomplete bladder emptying    Malignant neoplasm of prostate (HCC) 2010   radiation   Nodular prostate with lower urinary tract symptoms    Panic attacks 04/09/2015   Primary localized osteoarthrosis of the knee, right 05/19/2016   Prostatitis    S/P total knee arthroplasty, left 05/19/2016   Spermatocele    Weak urinary stream    Past Surgical History:  Procedure Laterality Date   COLON RESECTION   NOV 2009   COLONOSCOPY     PROSTATE BIOPSY     SPERMATOCELECTOMY Left 05/07/2014   Procedure: LEFT SPERMATOCELECTOMY;  Surgeon: Norleen JINNY Seltzer, MD;  Location: [facility];  Service: Urology;  Laterality: Left;   TONSILLECTOMY     TOTAL KNEE ARTHROPLASTY Left 04/21/2015   Procedure: LEFT TOTAL KNEE ARTHROPLASTY;  Surgeon: Lamar Millman, MD;  Location: [facility] OR;  Service: Orthopedics;  Laterality: Left;   TOTAL KNEE ARTHROPLASTY Right 05/31/2016   Procedure: TOTAL KNEE ARTHROPLASTY;  Surgeon: Lamar Millman, MD;  Location: [facility] OR;  Service: Orthopedics;  Laterality: Right;   TRANSURETHRAL RESECTION OF PROSTATE N/A 05/07/2014   Procedure: TRANSURETHRAL RESECTION OF THE PROSTATE WITH GYRUS INSTRUMENTS;  Surgeon: Norleen JINNY Seltzer, MD;  Location: [facility];   Service: Urology;  Laterality: N/A;   Family History  Problem Relation Age of Onset   Hypertension Mother    Stroke Father        age 58   Hypertension Father    Cancer Brother        neoplasm of brain   Restless legs syndrome Neg Hx    Sleep apnea Neg Hx    Social History   Socioeconomic History   Marital status: Married    Spouse name: Not on file   Number of children: 9   Years of education: Not on file   Highest education level: Not on file  Occupational History   Occupation: retire  Tobacco Use   Smoking status: Former    Current packs/day: 0.00    Average packs/day: 0.3 packs/day for 3.0 years (0.8 ttl pk-yrs)  Types: Cigarettes    Start date: 04/12/1969    Quit date: 04/12/1972    Years since quitting: 52.0   Smokeless tobacco: Never   Tobacco comments:    smoked 1 cigarette per day SOME DAYS  Vaping Use   Vaping status: Never Used  Substance and Sexual Activity   Alcohol use: Not Currently    Alcohol/week: 1.0 standard drink of alcohol    Types: 1 Cans of beer per week   Drug use: No   Sexual activity: Yes  Other Topics Concern   Not on file  Social History Narrative   George [name] will take care of him after his surgery    Her cell is [phone]      Update 06/22/2022   Caffeine: maybe 2 sodas/day    Lives at home with wife   Right handed   Social Drivers of Health   Tobacco Use: Medium Risk (04/18/2024)   Patient History    Smoking Tobacco Use: Former    Smokeless Tobacco Use: Never    Passive Exposure: Not on file  Financial Resource Strain: High Risk (12/14/2023)   Overall Financial Resource Strain (CARDIA)    Difficulty of Paying Living Expenses: Hard  Food Insecurity: Food Insecurity Present (12/23/2023)   Epic    Worried About Programme Researcher, Broadcasting/film/video in the Last Year: Sometimes true    Ran Out of Food in the Last Year: Never true  Transportation Needs: No Transportation Needs (12/23/2023)   Epic    Lack of Transportation (Medical): No    Lack  of Transportation (Non-Medical): No  Physical Activity: Sufficiently Active (12/14/2023)   Exercise Vital Sign    Days of Exercise per Week: 7 days    Minutes of Exercise per Session: 30 min  Stress: Stress Concern Present (12/14/2023)   [employer] of Occupational Health - Occupational Stress Questionnaire    Feeling of Stress: Very much  Social Connections: Unknown (12/14/2023)   Social Connection and Isolation Panel    Frequency of Communication with Friends and Family: Once a week    Frequency of Social Gatherings with Friends and Family: Never    Attends Religious Services: 1 to 4 times per year    Active Member of [employer] or Organizations: No    Attends Banker Meetings: Never    Marital Status: Patient declined  Catering Manager Violence: Not At Risk (12/23/2023)   Epic    Fear of Current or Ex-Partner: No    Emotionally Abused: No    Physically Abused: No    Sexually Abused: No  Depression (PHQ2-9): Medium Risk (03/02/2024)   Depression (PHQ2-9)    PHQ-2 Score: 9  Alcohol Screen: Low Risk (12/14/2023)   Alcohol Screen    Last Alcohol Screening Score (AUDIT): 2  Housing: Low Risk (12/23/2023)   Epic    Unable to Pay for Housing in the Last Year: No    Number of Times Moved in the Last Year: 0    Homeless in the Last Year: No  Utilities: Not At Risk (12/23/2023)   Epic    Threatened with loss of utilities: No  Health Literacy: Adequate Health Literacy (01/16/2024)   B1300 Health Literacy    Frequency of need for help with medical instructions: Never       Objective:  BP 132/72   Pulse 65   Temp 98.3 F (36.8 C) (Temporal)   Ht 6' (1.829 m)   Wt 199 lb 3.2 oz (90.4 kg)  SpO2 98% Comment: room air  BMI 27.02 kg/m  BMI Readings from Last 3 Encounters:  04/18/24 27.02 kg/m  03/19/24 27.10 kg/m  03/02/24 28.59 kg/m    Physical Exam: Physical Exam   ENT: Normal mucosa.  PULMONARY: Lungs clear to auscultation bilaterally, no adventitious breath  sounds. CARDIOVASCULAR: Regular rate and rhythm, S1 S2 normal, no murmurs. ABDOMEN: Abdomen soft, nontender. Bowel sounds are normal. EXTREMITIES: No peripheral edema.       Diagnostic Review:  Last metabolic panel Lab Results  Component Value Date   GLUCOSE 101 (H) 03/02/2024   NA 138 03/02/2024   K 4.2 03/02/2024   CL 106 03/02/2024   CO2 24 03/02/2024   BUN 19 03/02/2024   CREATININE 2.24 (H) 03/02/2024   GFR 27.04 (L) 03/02/2024   CALCIUM  9.1 03/02/2024   PHOS 3.0 03/05/2021   PROT 7.6 03/02/2024   ALBUMIN  4.1 03/02/2024   BILITOT 0.8 03/02/2024   ALKPHOS 89 03/02/2024   AST 18 03/02/2024   ALT 15 03/02/2024   ANIONGAP 6 06/28/2023         Assessment & Plan:   Assessment & Plan Mild persistent asthma without complication Unable to do FENO.   Orders:   Nitric oxide; Future  DOE (dyspnea on exertion)        Assessment and Plan    Mild persistent asthma Borderline PFT suggests early COPD or asthma. CT shows very mild cylindrical bronchiectasis. Symptoms include exertional dyspnea. Differential includes early COPD and asthma. cardiac etiology is possible. The lung findings are minimal and likely not explain his severity of DOE.  - Initiated symbicort  inhaler, two puffs twice daily. - Instructed to clean mouth and gargle after inhaler use. - FENO - Consider CPET if symptoms persist and other tests are negative.  Dyspnea on exertion Occurs primarily during activity. Previous echocardiogram normal. Amiodarone discontinued due to potential respiratory effects by cards.  - Continue with stress test as scheduled by cardiologist. - Monitor response to inhaler treatment for asthma. - Discuss potential cardiac causes if symptoms persist.         Notes from PCP 03/02/2024 reviewed as to gather relevant information for patient care and formulating plan.    Return in about 3 months (around 07/17/2024).   I personally spent a total of 20 minutes in the care of  the patient today including preparing to see the patient, getting/reviewing separately obtained history, performing a medically appropriate exam/evaluation, counseling and educating, placing orders, documenting clinical information in the EHR, independently interpreting results, and communicating results.   Sammi Fredericks, MD "

## 2024-04-18 NOTE — Patient Instructions (Signed)
" °  VISIT SUMMARY:  Today, you were seen for shortness of breath that occurs during physical activities. You do not experience shortness of breath while sitting or lying down. You have a history of childhood asthma and past smoking. A recent CT scan showed mild cylindrical bronchiectasis, and you recently stopped taking amiodarone due to concerns about its impact on your breathing.  YOUR PLAN:  -MILD PERSISTENT ASTHMA: Mild persistent asthma is a condition where the airways in your lungs are inflamed and can cause breathing difficulties. We have started you on a steroid inhaler, which you should use two puffs twice daily. Please remember to clean your mouth and gargle after using the inhaler to prevent any side effects. We also performed a quick test to check for inflammation in your body. If your symptoms continue, we may consider additional testing like a CPET to further investigate.  -DYSPNEA ON EXERTION: Dyspnea on exertion means experiencing shortness of breath during physical activities. We will continue with the stress test as scheduled by your cardiologist to rule out any heart-related causes. Please monitor how you respond to the inhaler treatment for asthma. If your symptoms persist, we will discuss other potential causes, including cardiac issues.                        Contains text generated by Abridge.                                 Contains text generated by Abridge.   "

## 2024-04-20 ENCOUNTER — Other Ambulatory Visit (HOSPITAL_COMMUNITY)

## 2024-04-24 ENCOUNTER — Other Ambulatory Visit: Payer: Self-pay | Admitting: Physician Assistant

## 2024-04-24 DIAGNOSIS — I10 Essential (primary) hypertension: Secondary | ICD-10-CM

## 2024-04-30 ENCOUNTER — Ambulatory Visit (HOSPITAL_COMMUNITY)
Admission: RE | Admit: 2024-04-30 | Discharge: 2024-04-30 | Disposition: A | Discharge status: Home / Self Care | Source: Ambulatory Visit | Attending: Cardiology | Admitting: Cardiology

## 2024-04-30 DIAGNOSIS — R079 Chest pain, unspecified: Secondary | ICD-10-CM

## 2024-04-30 MED ORDER — TECHNETIUM TC 99M TETROFOSMIN IV KIT
30.0000 | PACK | Freq: Once | INTRAVENOUS | Status: AC | PRN
  Administered 2024-04-30: 31.7 via INTRAVENOUS

## 2024-04-30 MED ORDER — REGADENOSON 0.4 MG/5ML IV SOLN
0.4000 mg | Freq: Once | INTRAVENOUS | Status: AC
  Administered 2024-04-30: 0.4 mg via INTRAVENOUS

## 2024-04-30 MED ORDER — TECHNETIUM TC 99M TETROFOSMIN IV KIT
10.0000 | PACK | Freq: Once | INTRAVENOUS | Status: AC | PRN
  Administered 2024-04-30: 10.9 via INTRAVENOUS

## 2024-04-30 MED ORDER — REGADENOSON 0.4 MG/5ML IV SOLN
INTRAVENOUS | Status: AC
  Filled 2024-04-30: qty 5

## 2024-05-01 ENCOUNTER — Telehealth: Payer: Self-pay | Admitting: Physician Assistant

## 2024-05-01 NOTE — Telephone Encounter (Signed)
 Forwarding to Reader B= Dr Kriste

## 2024-05-01 NOTE — Telephone Encounter (Signed)
 Forwarding to DOD2  Franky calling from [facility] in regards to stress test needing to be read, he would like a c/b regarding this matter. Please advise.

## 2024-05-01 NOTE — Telephone Encounter (Signed)
 Franky calling from [facility] in regards to stress test needing to be read, he would like a c/b regarding this matter. Please advise.

## 2024-05-02 LAB — NM MYOCAR MULTI W/SPECT W/WALL MOTION / EF
Rest Nuclear Isotope Dose: 10.9 mCi
ST Depression (mm): 0 mm
Stress Nuclear Isotope Dose: 31.7 mCi
TID: 1.11

## 2024-05-16 ENCOUNTER — Encounter: Payer: Self-pay | Admitting: Physician Assistant

## 2024-05-16 ENCOUNTER — Ambulatory Visit: Admitting: Physician Assistant

## 2024-05-16 ENCOUNTER — Ambulatory Visit

## 2024-05-16 VITALS — BP 118/56 | HR 59 | Temp 97.6°F | Ht 72.0 in | Wt 201.4 lb

## 2024-05-16 DIAGNOSIS — Z8546 Personal history of malignant neoplasm of prostate: Secondary | ICD-10-CM

## 2024-05-16 DIAGNOSIS — G8929 Other chronic pain: Secondary | ICD-10-CM

## 2024-05-16 DIAGNOSIS — M25512 Pain in left shoulder: Secondary | ICD-10-CM

## 2024-05-16 DIAGNOSIS — N184 Chronic kidney disease, stage 4 (severe): Secondary | ICD-10-CM

## 2024-05-16 DIAGNOSIS — I10 Essential (primary) hypertension: Secondary | ICD-10-CM

## 2024-05-16 DIAGNOSIS — I482 Chronic atrial fibrillation, unspecified: Secondary | ICD-10-CM

## 2024-05-16 DIAGNOSIS — M25511 Pain in right shoulder: Secondary | ICD-10-CM

## 2024-05-16 DIAGNOSIS — I69354 Hemiplegia and hemiparesis following cerebral infarction affecting left non-dominant side: Secondary | ICD-10-CM

## 2024-05-16 DIAGNOSIS — R0609 Other forms of dyspnea: Secondary | ICD-10-CM

## 2024-05-16 NOTE — Progress Notes (Signed)
 "   Patient ID: George [name]., male    DOB: 09/24/1945, 79 y.o.   MRN: [id number]   Assessment & Plan:  Chronic pain of both shoulders -     DG Shoulder Right; Future -     DG Shoulder Left; Future  Essential hypertension  Dyspnea on exertion  Chronic kidney disease (CKD), stage 4 (HCC)  Hemiparesis affecting left side as late effect of cerebrovascular accident [facility])  Chronic atrial fibrillation (HCC)  History of prostate cancer      Assessment and Plan Assessment & Plan Bilateral shoulder osteoarthritis Chronic bilateral shoulder pain, likely due to osteoarthritis, with pain exacerbated by certain movements and a sensation of bone-on-bone grinding. No history of injury or falls. X-rays show significant arthritis. - Ordered x-rays of both shoulders - Will consider referral to orthopedist for further management, including potential injections  Chronic kidney disease, stage 4 Chronic kidney disease stage 4, managed by nephrologist. - Continue follow-up with nephrologist - Avoid NSAIDs - Good BP control  Chronic atrial fibrillation Managed with Eliquis . No recent episodes of dizziness or lightheadedness. Blood pressure is well-controlled. No bleeding issues.  - Continue Eliquis  2.5 mg twice daily - Monitor blood pressure at home - Follow up with cardiologist as scheduled  Hemiparesis affecting left side as late effect of cerebrovascular accident Hemiparesis affecting the left side as a late effect of a cerebrovascular accident. Minimal issues, minimal impairment to ADLs.  - Continue current management and monitoring  Essential hypertension Blood pressure is well-controlled with current medication regimen. Recent reading was 118/56, which is within acceptable range. - Continue current antihypertensive regimen - Monitor blood pressure at home  Dyspnea on exertion Improving with inhaler use. Pulmonary team suspects early COPD or asthma. Previous echocardiogram was  normal, and amiodarone was discontinued due to potential respiratory effects. - Continue Symbicort  and albuterol  inhalers - Follow up with pulmonary team in April  History of prostate cancer Prostate cancer, last seen by urology over a year ago. PSA monitoring is due. - Contact urology for follow-up appointment - Ensure annual PSA monitoring  General health maintenance Weight has stabilized. Blood work was normal. Regular follow-up with specialists is ongoing. - Continue regular follow-up with specialists - Scheduled follow-up visit in six months      Return in about 6 months (around 11/13/2024) for recheck/follow-up.    Subjective:    Chief Complaint  Patient presents with   Medical Management of Chronic Issues    Pt in office for 6 mon f/u; pt states everything is going well, seen Pulmonology, pt states some days breathing is better than others; pt has concerns in both shoulders states requesting referral to see if it is arthritis or something else.     HPI Discussed the use of AI scribe software for clinical note transcription with the patient, who gave verbal consent to proceed.  History of Present Illness George [name]. is an 79 year old male with COPD and atrial fibrillation who presents for a follow-up visit. Here with his wife.   He has experienced low blood pressure today, measured at 106/56, which is lower than his usual readings of 118/70s. No dizziness or lightheadedness is reported, and there have been no recent changes in medication or activity that could explain the drop.  He experiences dyspnea on exertion and is under the care of a pulmonary team. Previous pulmonary function tests suggested early COPD or asthma. He uses Symbicort  and albuterol  inhalers, which have been somewhat effective. He notes  variability in his breathing, with some days being better than others.  He has a history of shoulder pain for over a year, described as a sensation of bones  rubbing together, similar to his past knee arthritis. The pain is exacerbated by certain movements, particularly when the arm is bent at a 45-degree angle. He denies any recent injuries or falls.  He takes Eliquis  2.5 mg twice daily for chronic atrial fibrillation and has a history of CKD stage 4, for which he sees a nephrologist. He also has a history of a stroke with minimal left-sided hemiparesis. No dizziness, lightheadedness, numbness, or tingling in the arms.     Past Medical History:  Diagnosis Date   Anxiety    Arthritis    OSTEO IN KNEE   Asthma    as child   Colon cancer (HCC) 2009   chemotherapy   Depression    Elevated prostate specific antigen (PSA)    Generalized anxiety disorder 04/09/2015   Hematospermia    Hx antineoplastic chemotherapy 2010   Hypertension    Idiopathic peripheral neuropathy    TOES OF BOTH FEET DUE TO CHEMO   Incomplete bladder emptying    Malignant neoplasm of prostate (HCC) 2010   radiation   Nodular prostate with lower urinary tract symptoms    Panic attacks 04/09/2015   Primary localized osteoarthrosis of the knee, right 05/19/2016   Prostatitis    S/P total knee arthroplasty, left 05/19/2016   Spermatocele    Weak urinary stream     Past Surgical History:  Procedure Laterality Date   COLON RESECTION   NOV 2009   COLONOSCOPY     PROSTATE BIOPSY     SPERMATOCELECTOMY Left 05/07/2014   Procedure: LEFT SPERMATOCELECTOMY;  Surgeon: Norleen JINNY Seltzer, MD;  Location: [facility];  Service: Urology;  Laterality: Left;   TONSILLECTOMY     TOTAL KNEE ARTHROPLASTY Left 04/21/2015   Procedure: LEFT TOTAL KNEE ARTHROPLASTY;  Surgeon: Lamar Millman, MD;  Location: [facility] OR;  Service: Orthopedics;  Laterality: Left;   TOTAL KNEE ARTHROPLASTY Right 05/31/2016   Procedure: TOTAL KNEE ARTHROPLASTY;  Surgeon: Lamar Millman, MD;  Location[facility];  Service: Orthopedics;  Laterality: Right;   TRANSURETHRAL RESECTION OF PROSTATE N/A 05/07/2014   Procedure:  TRANSURETHRAL RESECTION OF THE PROSTATE WITH GYRUS INSTRUMENTS;  Surgeon: Norleen JINNY Seltzer, MD;  Location: [facility];  Service: Urology;  Laterality: N/A;    Family History  Problem Relation Age of Onset   Hypertension Mother    Stroke Father        age 4   Hypertension Father    Cancer Brother        neoplasm of brain   Restless legs syndrome Neg Hx    Sleep apnea Neg Hx     Social History[1]   Allergies[2]  Review of Systems NEGATIVE UNLESS OTHERWISE INDICATED IN HPI      Objective:     BP (!) 118/56 (BP Location: Left Arm, Patient Position: Sitting, Cuff Size: Normal)   Pulse (!) 59   Temp 97.6 F (36.4 C) (Oral)   Ht 6' (1.829 m)   Wt 201 lb 6.4 oz (91.4 kg)   SpO2 98%   BMI 27.31 kg/m   Wt Readings from Last 3 Encounters:  05/16/24 201 lb 6.4 oz (91.4 kg)  04/18/24 199 lb 3.2 oz (90.4 kg)  03/19/24 199 lb 12.8 oz (90.6 kg)    BP Readings from Last 3 Encounters:  05/16/24 (!) 118/56  04/30/24 122/74  04/18/24 132/72     Physical Exam Vitals and nursing note reviewed.  Constitutional:      General: He is not in acute distress.    Appearance: Normal appearance. He is not ill-appearing.  HENT:     Head: Normocephalic.  Eyes:     Extraocular Movements: Extraocular movements intact.     Conjunctiva/sclera: Conjunctivae normal.     Pupils: Pupils are equal, round, and reactive to light.  Cardiovascular:     Rate and Rhythm: Regular rhythm. Bradycardia present.  Pulmonary:     Effort: Pulmonary effort is normal. No respiratory distress.     Breath sounds: Normal breath sounds. No wheezing, rhonchi or rales.  Musculoskeletal:        General: Tenderness (diffuse bilateral shoulders, somewhat limited ROM R shoulder worse than left due to pain) present. No swelling or signs of injury.     Cervical back: Normal range of motion.     Right lower leg: No edema.     Left lower leg: No edema.  Skin:    General: Skin is warm.  Neurological:     Mental Status: He  is alert and oriented to person, place, and time.     Motor: Weakness (left lower extremity weaker than R, chronic since CVA) present.  Psychiatric:        Mood and Affect: Mood normal.        Behavior: Behavior normal.             Bunny Lowdermilk M Krystalyn Kubota, PA-C     [1]  Social History Tobacco Use   Smoking status: Former    Current packs/day: 0.00    Average packs/day: 0.3 packs/day for 3.0 years (0.8 ttl pk-yrs)    Types: Cigarettes    Start date: 04/12/1969    Quit date: 04/12/1972    Years since quitting: 52.1   Smokeless tobacco: Never   Tobacco comments:    smoked 1 cigarette per day SOME DAYS  Vaping Use   Vaping status: Never Used  Substance Use Topics   Alcohol use: Not Currently    Alcohol/week: 1.0 standard drink of alcohol    Types: 1 Cans of beer per week   Drug use: No  [2]  Allergies Allergen Reactions   Gabapentin  Other (See Comments)    hallucinations   Amlodipine Other (See Comments)    Dizziness    "

## 2024-05-16 NOTE — Patient Instructions (Signed)
" °  VISIT SUMMARY: Today you had a follow-up visit to discuss your COPD, atrial fibrillation, and other health concerns. Your blood pressure was slightly lower than usual, but you did not experience any dizziness or lightheadedness. We reviewed your shoulder pain, kidney disease, and other ongoing health issues.  YOUR PLAN: BILATERAL SHOULDER OSTEOARTHRITIS: You have chronic shoulder pain likely due to osteoarthritis, with pain worsening during certain movements. -We have ordered x-rays of both shoulders. -We may refer you to an orthopedist for further management, including potential injections.  CHRONIC KIDNEY DISEASE, STAGE 4: Your chronic kidney disease is being managed by your nephrologist. -Continue following up with your nephrologist.  CHRONIC ATRIAL FIBRILLATION: Your atrial fibrillation is managed with Eliquis , and your blood pressure is well-controlled. -Continue taking Eliquis  2.5 mg twice daily. -Monitor your blood pressure at home. -Follow up with your cardiologist as scheduled.  HEMIPARESIS AFFECTING LEFT SIDE AS LATE EFFECT OF CEREBROVASCULAR ACCIDENT: You have hemiparesis on your left side as a result of a previous stroke. -Continue with your current management and monitoring.  ESSENTIAL HYPERTENSION: Your blood pressure is well-controlled with your current medication. -Continue your current antihypertensive regimen. -Monitor your blood pressure at home.  DYSPNEA ON EXERTION: Your shortness of breath on exertion is improving with inhaler use. It may be due to early COPD or asthma. -Continue using Symbicort  and albuterol  inhalers. -Follow up with your pulmonary team in April.  HISTORY OF PROSTATE CANCER: You have a history of prostate cancer and are due for PSA monitoring. -Contact urology for a follow-up appointment. -Ensure annual PSA monitoring.  GENERAL HEALTH MAINTENANCE: Your weight has stabilized and your blood work was normal. -Continue regular follow-up with your  specialists. -Your next follow-up visit is scheduled in six months.    Contains text generated by [employer].   "

## 2024-05-18 ENCOUNTER — Ambulatory Visit: Payer: Self-pay | Admitting: Physician Assistant

## 2024-07-30 ENCOUNTER — Ambulatory Visit

## 2024-11-13 ENCOUNTER — Ambulatory Visit: Admitting: Physician Assistant

## 2024-12-18 ENCOUNTER — Ambulatory Visit
